# Patient Record
Sex: Male | Born: 1945
Health system: Southern US, Community
[De-identification: ages and names within clinical notes are randomized; demographics above are authoritative.]

## PROBLEM LIST (undated history)

## (undated) DIAGNOSIS — C61 Malignant neoplasm of prostate: Secondary | ICD-10-CM

## (undated) DIAGNOSIS — E119 Type 2 diabetes mellitus without complications: Secondary | ICD-10-CM

## (undated) DIAGNOSIS — R7303 Prediabetes: Secondary | ICD-10-CM

## (undated) DIAGNOSIS — I714 Abdominal aortic aneurysm, without rupture, unspecified: Secondary | ICD-10-CM

## (undated) DIAGNOSIS — I10 Essential (primary) hypertension: Secondary | ICD-10-CM

## (undated) DIAGNOSIS — Z923 Personal history of irradiation: Secondary | ICD-10-CM

## (undated) HISTORY — PX: EYE SURGERY: SHX253

## (undated) HISTORY — PX: HERNIA REPAIR: SHX51

## (undated) HISTORY — DX: Type 2 diabetes mellitus without complications: E11.9

## (undated) HISTORY — PX: CATARACT EXTRACTION, BILATERAL: SHX1313

## (undated) HISTORY — DX: Abdominal aortic aneurysm, without rupture, unspecified: I71.40

## (undated) HISTORY — DX: Personal history of irradiation: Z92.3

---

## 1998-04-25 ENCOUNTER — Emergency Department (HOSPITAL_COMMUNITY): Admission: EM | Admit: 1998-04-25 | Discharge: 1998-04-25 | Payer: Self-pay | Admitting: Emergency Medicine

## 2000-12-31 ENCOUNTER — Encounter: Payer: Self-pay | Admitting: Family Medicine

## 2000-12-31 ENCOUNTER — Encounter: Admission: RE | Admit: 2000-12-31 | Discharge: 2000-12-31 | Payer: Self-pay | Admitting: Family Medicine

## 2001-07-25 ENCOUNTER — Encounter: Admission: RE | Admit: 2001-07-25 | Discharge: 2001-07-25 | Payer: Self-pay | Admitting: Family Medicine

## 2001-07-25 ENCOUNTER — Encounter: Payer: Self-pay | Admitting: Family Medicine

## 2003-09-20 ENCOUNTER — Emergency Department (HOSPITAL_COMMUNITY): Admission: EM | Admit: 2003-09-20 | Discharge: 2003-09-20 | Payer: Self-pay | Admitting: Emergency Medicine

## 2004-03-18 ENCOUNTER — Encounter: Admission: RE | Admit: 2004-03-18 | Discharge: 2004-03-18 | Payer: Self-pay | Admitting: Family Medicine

## 2004-05-07 ENCOUNTER — Encounter: Admission: RE | Admit: 2004-05-07 | Discharge: 2004-05-07 | Payer: Self-pay | Admitting: *Deleted

## 2004-05-19 ENCOUNTER — Ambulatory Visit (HOSPITAL_COMMUNITY): Admission: RE | Admit: 2004-05-19 | Discharge: 2004-05-19 | Payer: Self-pay | Admitting: *Deleted

## 2006-02-16 ENCOUNTER — Ambulatory Visit: Payer: Self-pay

## 2006-02-16 ENCOUNTER — Encounter: Payer: Self-pay | Admitting: Cardiology

## 2008-01-25 ENCOUNTER — Encounter: Admission: RE | Admit: 2008-01-25 | Discharge: 2008-01-25 | Payer: Self-pay | Admitting: Family Medicine

## 2012-08-25 HISTORY — PX: PROSTATE BIOPSY: SHX241

## 2012-08-30 DIAGNOSIS — C61 Malignant neoplasm of prostate: Secondary | ICD-10-CM

## 2012-08-30 HISTORY — DX: Malignant neoplasm of prostate: C61

## 2012-11-02 ENCOUNTER — Encounter: Payer: Self-pay | Admitting: Radiation Oncology

## 2012-11-02 NOTE — Progress Notes (Signed)
GU Location of Tumor / Histology: adenocarcinoma of the prostate  If Prostate Cancer, Gleason Score is (3 + 3=6 and 3+4=7 in 6 cores on the right side) and PSA is (4.82) and prostate volume is 65.71 cc.  Patient presented with an elevated PSA of 4.82.  Biopsies of prostate (if applicable) revealed: adenocarcinoma of the prostate  Past/Anticipated interventions by urology, if any: prostate biopsy  Past/Anticipated interventions by medical oncology, if any: none  Weight changes, if any: none  Bowel/Bladder complaints, if any: Does have occasional problems emptying his bladder completely, urinating less than two hours after urinating, stopped and started urinary stream and weak urinary stream.  He does have occasional constipation.    Nausea/Vomiting, if any: no  Pain issues, if any:  no  SAFETY ISSUES:  Prior radiation? no  Pacemaker/ICD? no  Possible current pregnancy? no  Is the patient on methotrexate? no  Current Complaints / other details:  Nicholas Burke here with his daughter for consult for prostate cancer.  He denies pain, dysuria and blood in his urine.  His wife is sick and has cancer.

## 2012-11-03 ENCOUNTER — Ambulatory Visit
Admission: RE | Admit: 2012-11-03 | Discharge: 2012-11-03 | Disposition: A | Discharge status: Home / Self Care | Payer: Medicare Other | Source: Ambulatory Visit | Attending: Radiation Oncology | Admitting: Radiation Oncology

## 2012-11-03 ENCOUNTER — Encounter: Payer: Self-pay | Admitting: Radiation Oncology

## 2012-11-03 VITALS — BP 136/77 | HR 64 | Temp 98.2°F | Ht 69.0 in | Wt 206.4 lb

## 2012-11-03 DIAGNOSIS — C61 Malignant neoplasm of prostate: Secondary | ICD-10-CM

## 2012-11-03 HISTORY — DX: Essential (primary) hypertension: I10

## 2012-11-03 HISTORY — DX: Malignant neoplasm of prostate: C61

## 2012-11-03 NOTE — Addendum Note (Signed)
Encounter addended by: Maryln Gottron, MD on: 11/03/2012  5:09 PM<BR>     Documentation filed: Normajean Glasgow VN

## 2012-11-03 NOTE — Progress Notes (Signed)
[facility] Radiation Oncology NEW PATIENT EVALUATION  Name: Nicholas Burke MRN: [id number]  Date:   11/03/2012           DOB: 08-05-1945  Status: outpatient   CC: Dr. Jeanmarie Hubert,  Lindaann Slough, MD    REFERRING PHYSICIAN: Lindaann Slough, MD   DIAGNOSIS: Stage TI C. intermediate risk adenocarcinoma prostate   HISTORY OF PRESENT ILLNESS:  Yohannes Waibel is a 67 y.o. male who is seen today for the courtesy of Dr. Brunilda Payor for discussion of possible radiation therapy options in the management of his stage TI C. intermediate risk adenocarcinoma prostate. He was noted to have a rise in his PSA from 3.57 in February 2013 to 4.82 in February of 2014. He was sent over to Dr. Brunilda Payor by Dr. Manus Gunning for further evaluation. He underwent ultrasound-guided biopsies on 08/30/2012 and was found to have Gleason 6 (3+3) involving 50% of one core from the right lateral base, 40% of one core from the right base and 10% of one core from the right mid gland. He Gleason 7 (3+4) in 40% of one core from the right lateral mid gland, 40% of one core from right lateral apex and 40% of one core from the right apex. All biopsies on the left were benign. His gland volume was 65.7 cc. He is doing well from a GU and GI standpoint. His I PSS score is 5. He claims that he is potent. He is interested in seed implantation. He has a strong family history of prostate cancer in his father and brother.  PREVIOUS RADIATION THERAPY: No   PAST MEDICAL HISTORY:  has a past medical history of Hypertension and Prostate cancer.     PAST SURGICAL HISTORY:  Past Surgical History  Procedure Laterality Date  . Eye surgery Bilateral   . Prostate biopsy  08/2012     FAMILY HISTORY: family history includes Prostate cancer in his brother and father. His father was diagnosed with prostate cancer in his late 68s. He is currently alive and living independently at 50. His brother was diagnosed with prostate cancer 46 and was treated  surgically.   SOCIAL HISTORY:  reports that he has never smoked. He does not have any smokeless tobacco history on file. He reports that he does not drink alcohol or use illicit drugs. Married, 4 children. Retired Chartered certified accountant.   ALLERGIES: Review of patient's allergies indicates no known allergies.   MEDICATIONS:  Current Outpatient Prescriptions  Medication Sig Dispense Refill  . diltiazem (DILACOR XR) 240 MG 24 hr capsule Take 240 mg by mouth daily.      . potassium chloride (KLOR-CON) 20 MEQ packet Take 20 mEq by mouth daily.       . quinapril-hydrochlorothiazide (ACCURETIC) 20-25 MG per tablet Take 1 tablet by mouth daily.       No current facility-administered medications for this encounter.     REVIEW OF SYSTEMS:  Pertinent items are noted in HPI.    PHYSICAL EXAM:  height is 5\' 9"  (1.753 m) and weight is 206 lb 6.4 oz (93.622 kg). His temperature is 98.2 F (36.8 C). His blood pressure is 136/77 and his pulse is 64.   Alert and oriented. Head and neck examination: Grossly unremarkable. Nodes: Without palpable cervical or supraclavicular lymphadenopathy. Chest: Lungs clear. Back: No spinal or CVA tenderness. Heart: Regular rate and rhythm. Abdomen: Without masses organomegaly. Genitalia: Unremarkable to inspection. Rectal: The prostate gland is moderately enlarged and is without focal induration or nodularity. Extremities:  Without edema.    LABORATORY DATA:  No results found for this basename: WBC, HGB, HCT, MCV, PLT   No results found for this basename: NA, K, CL, CO2   No results found for this basename: ALT, AST, GGT, ALKPHOS, BILITOT   PSA 4.82 from 06/16/2012   IMPRESSION: Stage TI C. intermediate risk adenocarcinoma prostate. I explained to the patient that his management options include surgery versus radiation therapy. Radiation therapy options include 5 weeks of external beam followed by seed implantation or 8 weeks of external beam/IMRT. He's not a candidate for  seed implantation alone in view of his moderate volume Gleason 7 disease. He may require downsizing for seed implantation based on his volume of 65.7 cc. We discussed the potential acute and late toxicities of radiation therapy. He is not interested in considering robotic surgery. After a lengthy discussion he is most interested in external beam/IMRT without downsizing. I believe that this would be well tolerated. Consent is signed today. I will ask Dr. Brunilda Payor to placed 3 gold seed markers for image guidance during his IMRT. He can then return here for simulation/treatment planning. His prognosis appears to be favorable.   PLAN: As discussed above. After placement of 3 gold seed markers he will be scheduled to return here for simulation/treatment planning.  I spent 60 minutes minutes face to face with the patient and more than 50% of that time was spent in counseling and/or coordination of care.

## 2012-11-03 NOTE — Progress Notes (Signed)
Please see the Nurse Progress Note in the MD Initial Consult Encounter for this patient. 

## 2012-11-04 ENCOUNTER — Telehealth: Payer: Self-pay | Admitting: *Deleted

## 2012-11-04 NOTE — Telephone Encounter (Signed)
CALLED  PATIENT TO INFORM OF GOLD SEED PLACEMENT ON 12-15-12 -ARRIVAL TIME - 2PM AT DR. Brunilda Payor' OFFICE AND HIS SIM ON 12-19-12 AT 8 AM AT DR. MURRAY'S OFFICE, SPOKE WITH PATIENT AND HE IS AWARE OF THESE APPTS.

## 2012-11-18 ENCOUNTER — Telehealth: Payer: Self-pay | Admitting: *Deleted

## 2012-11-18 NOTE — Telephone Encounter (Signed)
Received FMLA papers for [employer] and [employer].  Put on Angela's desk.

## 2012-11-21 ENCOUNTER — Encounter: Payer: Self-pay | Admitting: Radiation Oncology

## 2012-11-21 NOTE — Progress Notes (Signed)
FMLA paperwork rec'd for patient's daughter - given to Dr. Rennie Plowman nurse.

## 2012-11-23 ENCOUNTER — Telehealth: Payer: Self-pay | Admitting: Radiation Oncology

## 2012-11-23 NOTE — Telephone Encounter (Signed)
Placed purple folder with two sets of FMLA paperwork on Dr. Dayton Scrape. Paperwork needs to be signed by Dr. Dayton Scrape to be complete.

## 2012-11-24 ENCOUNTER — Encounter: Payer: Self-pay | Admitting: Radiation Oncology

## 2012-11-24 NOTE — Progress Notes (Signed)
Rec'd 2 sets of FMLA paperwork back from physician.  Called [place] and lm for her regarding pick up of paperwork.  She left me a message and said that her sister, Hearl Heikes, will stop by to pick up.

## 2012-12-15 ENCOUNTER — Telehealth: Payer: Self-pay | Admitting: *Deleted

## 2012-12-15 NOTE — Telephone Encounter (Signed)
CALLED PATIENT TO REMIND OF APPT. FOR 12-19-12, LVM FOR A RETURN CALL

## 2012-12-19 ENCOUNTER — Ambulatory Visit
Admission: RE | Admit: 2012-12-19 | Discharge: 2012-12-19 | Disposition: A | Discharge status: Home / Self Care | Payer: Medicare Other | Source: Ambulatory Visit | Attending: Radiation Oncology | Admitting: Radiation Oncology

## 2012-12-19 DIAGNOSIS — Z510 Encounter for antineoplastic radiation therapy: Secondary | ICD-10-CM | POA: Insufficient documentation

## 2012-12-19 DIAGNOSIS — K5900 Constipation, unspecified: Secondary | ICD-10-CM | POA: Insufficient documentation

## 2012-12-19 DIAGNOSIS — R3915 Urgency of urination: Secondary | ICD-10-CM | POA: Insufficient documentation

## 2012-12-19 DIAGNOSIS — C61 Malignant neoplasm of prostate: Secondary | ICD-10-CM

## 2012-12-19 DIAGNOSIS — R300 Dysuria: Secondary | ICD-10-CM | POA: Insufficient documentation

## 2012-12-19 NOTE — Progress Notes (Signed)
Complex simulation/treatment planning note: The patient was taken to the CT simulator. He was placed supine. A VAC LOC immobilization device was constructed for immobilization. A red rubber tube was placed within the rectal vault. He then underwent a urethrogram. His pelvis was scanned. I contoured the prostate, seminal vesicles, bladder, rectum, and the rectosigmoid colon. I prescribing 7800 cGy in 40 sessions utilizing 6 MV photons, IMRT/VMAT. The seminal vesicles will receive 5600 cGy 40 sessions. He is be treated with a comfortably full bladder. He'll undergo daily image guidance with cone beam CT setting up to his gold seeds.

## 2012-12-20 ENCOUNTER — Encounter: Payer: Self-pay | Admitting: Radiation Oncology

## 2012-12-20 NOTE — Progress Notes (Signed)
IMRT simulation/treatment planning note: Nicholas Burke completed his IMRT simulation/treatment planning today. IMRT was chosen to decrease the risk for both acute and late bladder and rectal toxicity compared to 3-D conformal or conventional radiation therapy. Dose volume histograms were obtained for the target structures including the prostate and seminal vesicles. Dose volume histograms were obtained for the avoidance structures including the bladder, rectum, and femoral heads. We met our departmental guidelines. He is a GU with VMAT IMRT with 2 arcs. I'm prescribing 7800 cGy in 40 sessions to his prostate PTV and 5600 cGy in 40 sessions to his seminal vesicles. He is be treated with a comfortably full bladder and undergo daily cone beam CT setting up to his 3 gold seeds.

## 2013-01-02 ENCOUNTER — Ambulatory Visit
Admission: RE | Admit: 2013-01-02 | Discharge: 2013-01-02 | Disposition: A | Discharge status: Home / Self Care | Payer: Medicare Other | Source: Ambulatory Visit | Attending: Radiation Oncology | Admitting: Radiation Oncology

## 2013-01-02 VITALS — BP 151/104 | HR 62 | Temp 98.1°F | Ht 69.0 in | Wt 210.1 lb

## 2013-01-02 DIAGNOSIS — C61 Malignant neoplasm of prostate: Secondary | ICD-10-CM

## 2013-01-02 NOTE — Progress Notes (Signed)
Wendelyn Breslow here for weekly under treat.  He has had 1 fraction to his prostate.  He reports not being able to fully empty his bladder in the mornings.  He denies nocturia and iarrhea.  He does see occasional blood in his urine since his prostate biopsy three months ago.

## 2013-01-02 NOTE — Progress Notes (Signed)
   Weekly Management Note:  outpatient Current Dose:  1.95 Gy  Projected Dose: 78 Gy   Narrative:  The patient presents for routine under treatment assessment.  CBCT/MVCT images/Port film x-rays were reviewed.  The chart was checked. No new complaints  Physical Findings:  height is 5\' 9"  (1.753 m) and weight is 210 lb 1.6 oz (95.301 kg). His temperature is 98.1 F (36.7 C). His blood pressure is 151/104 and his pulse is 62.  NAD  Impression:  The patient is tolerating radiotherapy.  Plan:  Continue radiotherapy as planned.   ________________________________   Lonie Peak, M.D.

## 2013-01-03 ENCOUNTER — Ambulatory Visit
Admission: RE | Admit: 2013-01-03 | Discharge: 2013-01-03 | Disposition: A | Discharge status: Home / Self Care | Payer: Medicare Other | Source: Ambulatory Visit | Attending: Radiation Oncology | Admitting: Radiation Oncology

## 2013-01-03 DIAGNOSIS — C61 Malignant neoplasm of prostate: Secondary | ICD-10-CM

## 2013-01-03 NOTE — Progress Notes (Signed)
Post sim ed completed w/pt. Gave pt "Radiation and You" booklet w/all pertinent information marked and discussed, re; fatigue, rectal pain/care, bladder irritation/care, nutrition, pain. All questions answered. Pt verbalized understanding.

## 2013-01-04 ENCOUNTER — Ambulatory Visit
Admission: RE | Admit: 2013-01-04 | Discharge: 2013-01-04 | Disposition: A | Discharge status: Home / Self Care | Payer: Medicare Other | Source: Ambulatory Visit | Attending: Radiation Oncology | Admitting: Radiation Oncology

## 2013-01-05 ENCOUNTER — Ambulatory Visit
Admission: RE | Admit: 2013-01-05 | Discharge: 2013-01-05 | Disposition: A | Discharge status: Home / Self Care | Payer: Medicare Other | Source: Ambulatory Visit | Attending: Radiation Oncology | Admitting: Radiation Oncology

## 2013-01-06 ENCOUNTER — Ambulatory Visit
Admission: RE | Admit: 2013-01-06 | Discharge: 2013-01-06 | Disposition: A | Discharge status: Home / Self Care | Payer: Medicare Other | Source: Ambulatory Visit | Attending: Radiation Oncology | Admitting: Radiation Oncology

## 2013-01-09 ENCOUNTER — Encounter: Payer: Self-pay | Admitting: Radiation Oncology

## 2013-01-09 ENCOUNTER — Ambulatory Visit
Admission: RE | Admit: 2013-01-09 | Discharge: 2013-01-09 | Disposition: A | Discharge status: Home / Self Care | Payer: Medicare Other | Source: Ambulatory Visit | Attending: Radiation Oncology | Admitting: Radiation Oncology

## 2013-01-09 VITALS — BP 147/91 | HR 74 | Temp 98.2°F | Resp 20 | Wt 208.6 lb

## 2013-01-09 DIAGNOSIS — C61 Malignant neoplasm of prostate: Secondary | ICD-10-CM

## 2013-01-09 NOTE — Progress Notes (Signed)
Weekly Management Note:  Site: Prostate Current Dose:  1170  cGy Projected Dose: 7800  cGy  Narrative: The patient is seen today for routine under treatment assessment. CBCT/MVCT images/port films were reviewed. The chart was reviewed.   Bladder filling is satisfactory. No new GU or GI difficulties. He does have some urinary urgency.  Physical Examination:  Filed Vitals:   01/09/13 0922  BP: 147/91  Pulse: 74  Temp: 98.2 F (36.8 C)  Resp: 20  .  Weight: 208 lb 9.6 oz (94.62 kg). No change.  Impression: Tolerating radiation therapy well.  Plan: Continue radiation therapy as planned.

## 2013-01-09 NOTE — Progress Notes (Signed)
Pt denies pain, fatigue, loss of appetite. He states he is beginning to have some urinary urgency. He denies other bladder or bowel issues.

## 2013-01-10 ENCOUNTER — Ambulatory Visit
Admission: RE | Admit: 2013-01-10 | Discharge: 2013-01-10 | Disposition: A | Discharge status: Home / Self Care | Payer: Medicare Other | Source: Ambulatory Visit | Attending: Radiation Oncology | Admitting: Radiation Oncology

## 2013-01-11 ENCOUNTER — Ambulatory Visit
Admission: RE | Admit: 2013-01-11 | Discharge: 2013-01-11 | Disposition: A | Discharge status: Home / Self Care | Payer: Medicare Other | Source: Ambulatory Visit | Attending: Radiation Oncology | Admitting: Radiation Oncology

## 2013-01-12 ENCOUNTER — Ambulatory Visit
Admission: RE | Admit: 2013-01-12 | Discharge: 2013-01-12 | Disposition: A | Discharge status: Home / Self Care | Payer: Medicare Other | Source: Ambulatory Visit | Attending: Radiation Oncology | Admitting: Radiation Oncology

## 2013-01-13 ENCOUNTER — Ambulatory Visit
Admission: RE | Admit: 2013-01-13 | Discharge: 2013-01-13 | Disposition: A | Discharge status: Home / Self Care | Payer: Medicare Other | Source: Ambulatory Visit | Attending: Radiation Oncology | Admitting: Radiation Oncology

## 2013-01-16 ENCOUNTER — Ambulatory Visit
Admission: RE | Admit: 2013-01-16 | Discharge: 2013-01-16 | Disposition: A | Discharge status: Home / Self Care | Payer: Medicare Other | Source: Ambulatory Visit | Attending: Radiation Oncology | Admitting: Radiation Oncology

## 2013-01-16 ENCOUNTER — Encounter: Payer: Self-pay | Admitting: Radiation Oncology

## 2013-01-16 ENCOUNTER — Other Ambulatory Visit: Payer: Self-pay | Admitting: Radiation Oncology

## 2013-01-16 ENCOUNTER — Telehealth: Payer: Self-pay | Admitting: *Deleted

## 2013-01-16 VITALS — BP 158/87 | HR 60 | Temp 97.9°F | Resp 20

## 2013-01-16 DIAGNOSIS — C61 Malignant neoplasm of prostate: Secondary | ICD-10-CM

## 2013-01-16 DIAGNOSIS — R300 Dysuria: Secondary | ICD-10-CM

## 2013-01-16 LAB — URINALYSIS, MICROSCOPIC - CHCC
Bilirubin (Urine): NEGATIVE
Ketones: NEGATIVE mg/dL
Nitrite: NEGATIVE
pH: 7.5 (ref 4.6–8.0)

## 2013-01-16 NOTE — Progress Notes (Signed)
Weekly Management Note:  Site: Prostate Current Dose:  2145  cGy Projected Dose: 7800  cGy  Narrative: The patient is seen today for routine under treatment assessment. CBCT/MVCT images/port films were reviewed. The chart was reviewed.   Bladder filling is less than ideal. He does report foul-smelling urine for the past week with change of his urine color. He is having some urinary frequency, urgency, and mild dysuria. He is also constipated.  Physical Examination:  Filed Vitals:   01/16/13 0910  BP: 158/87  Pulse: 60  Temp: 97.9 F (36.6 C)  Resp: 20  .  Weight:  . No change  Impression: Tolerating radiation therapy well. Will go ahead and check his urine for UA/C&S today to make sure he does not have an infection. I will like for him to start a stool softener. I also encouraged him to have a comfortably full bladder each day for his treatment.  Plan: Continue radiation therapy as planned.

## 2013-01-16 NOTE — Progress Notes (Signed)
Pt reports increased urinary frequency and nocturia 3 x nightly, urgency, dysuria, foul odor to his urine. He denies darker or cloudy urine. Pt states his bm's are small, hard balls. He states he has taken stool softeners in past, has not taken any at this time. Pt denies fatigue, loss of appetite.

## 2013-01-16 NOTE — Telephone Encounter (Addendum)
Spoke w/pt and informed him UA today was normal. Advised he increase fluid intake, avoid caffeine and alcohol as these are bladder irritants. Pt verbalized understanding.

## 2013-01-17 ENCOUNTER — Ambulatory Visit
Admission: RE | Admit: 2013-01-17 | Discharge: 2013-01-17 | Disposition: A | Discharge status: Home / Self Care | Payer: Medicare Other | Source: Ambulatory Visit | Attending: Radiation Oncology | Admitting: Radiation Oncology

## 2013-01-18 ENCOUNTER — Ambulatory Visit
Admission: RE | Admit: 2013-01-18 | Discharge: 2013-01-18 | Disposition: A | Discharge status: Home / Self Care | Payer: Medicare Other | Source: Ambulatory Visit | Attending: Radiation Oncology | Admitting: Radiation Oncology

## 2013-01-19 ENCOUNTER — Ambulatory Visit
Admission: RE | Admit: 2013-01-19 | Discharge: 2013-01-19 | Disposition: A | Discharge status: Home / Self Care | Payer: Medicare Other | Source: Ambulatory Visit | Attending: Radiation Oncology | Admitting: Radiation Oncology

## 2013-01-20 ENCOUNTER — Ambulatory Visit
Admission: RE | Admit: 2013-01-20 | Discharge: 2013-01-20 | Disposition: A | Discharge status: Home / Self Care | Payer: Medicare Other | Source: Ambulatory Visit | Attending: Radiation Oncology | Admitting: Radiation Oncology

## 2013-01-20 NOTE — Progress Notes (Signed)
Pt came to nursing after radiation treatment today stating he is still having burning w/urination, "the same as on Monday" when he saw Dr Dayton Scrape. Pt had UA at that time, results WNL; C&S showed 'no growth'. Pt states he began drinking cranberry juice yesterday. Instructed pt to buy OTC Azo or Cystex at drug store to take this weekend. Pt will see Dr Dayton Scrape 01/23/13 and can discuss further if he does not receive good results over the weekend. Pt also asking how to "keep his bladder full". He states he drinks water before treatment, "but it goes right through me". Advised pt to drink water as soon as he arrives for treatment on Monday. If that is not successful he can discuss w/Dr Dayton Scrape. Pt verbalized understanding, agreement.  Message routed to Dr Dayton Scrape who is out of office today.

## 2013-01-23 ENCOUNTER — Ambulatory Visit
Admission: RE | Admit: 2013-01-23 | Discharge: 2013-01-23 | Disposition: A | Discharge status: Home / Self Care | Payer: Medicare Other | Source: Ambulatory Visit | Attending: Radiation Oncology | Admitting: Radiation Oncology

## 2013-01-23 ENCOUNTER — Telehealth: Payer: Self-pay | Admitting: *Deleted

## 2013-01-23 ENCOUNTER — Encounter: Payer: Self-pay | Admitting: Radiation Oncology

## 2013-01-23 ENCOUNTER — Other Ambulatory Visit: Payer: Self-pay | Admitting: Radiation Oncology

## 2013-01-23 VITALS — BP 157/96 | Temp 98.2°F | Resp 20 | Wt 208.6 lb

## 2013-01-23 DIAGNOSIS — R300 Dysuria: Secondary | ICD-10-CM

## 2013-01-23 DIAGNOSIS — C61 Malignant neoplasm of prostate: Secondary | ICD-10-CM

## 2013-01-23 MED ORDER — PHENAZOPYRIDINE HCL 95 MG PO TABS: 95.0000 mg | ORAL_TABLET | Freq: Three times a day (TID) | ORAL | Status: DC | PRN

## 2013-01-23 NOTE — Progress Notes (Signed)
Weekly Management Note:  Site: Prostate Current Dose:  3120  cGy Projected Dose: 7800  cGy  Narrative: The patient is seen today for routine under treatment assessment. CBCT/MVCT images/port films were reviewed. The chart was reviewed.   Bladder filling satisfactory but less than ideal. He continues to have dysuria and urinary frequency with nocturia x3. His force of stream varies from weak to strong. No significant GI difficulties.  Physical Examination:  Filed Vitals:   01/23/13 0920  BP: 157/96  Temp: 98.2 F (36.8 C)  Resp: 20  .  Weight: 208 lb 9.6 oz (94.62 kg). No change.  Impression: Tolerating radiation therapy well. I'll start Pyridium for his dysuria. I suspect that he is having some degree of bladder spasms which may benefit from an anti-spasmodic medication. I am concerned that his stream is weak at times, and I would like to avoid having him going into urinary retention. If he still symptomatic but later this week then we will consider an anti-spasmodic.  Plan: Continue radiation therapy as planned.

## 2013-01-23 NOTE — Progress Notes (Addendum)
Pt states he took AZO over the weekend and had fair but short lived relief of dysuria. He states he has continued to drink cranberry juice. He states dysuria is not worse, but not improved either. Suggested he discuss dysuria w/Dr Dayton Scrape today for possible prescription medication. He also reports urgency. He states he took Miralax last week for constipation but then his bms were loose. He states they are back to normal now.

## 2013-01-23 NOTE — Telephone Encounter (Signed)
Received call from pt stating the pharmacist told him the prescription Dr Dayton Scrape sent today is the same as the AZO which the pt was already taking. Discussed w/Dr Dayton Scrape who states" Tell pt not to fill the prescription and to take Azo 2 tabs every 8 hours." Informed pt of Dr Rennie Plowman instructions; pt repeated instructions back correctly and verbalized understanding.

## 2013-01-24 ENCOUNTER — Ambulatory Visit
Admission: RE | Admit: 2013-01-24 | Discharge: 2013-01-24 | Disposition: A | Discharge status: Home / Self Care | Payer: Medicare Other | Source: Ambulatory Visit | Attending: Radiation Oncology | Admitting: Radiation Oncology

## 2013-01-25 ENCOUNTER — Ambulatory Visit
Admission: RE | Admit: 2013-01-25 | Discharge: 2013-01-25 | Disposition: A | Discharge status: Home / Self Care | Payer: Medicare Other | Source: Ambulatory Visit | Attending: Radiation Oncology | Admitting: Radiation Oncology

## 2013-01-26 ENCOUNTER — Telehealth: Payer: Self-pay | Admitting: *Deleted

## 2013-01-26 ENCOUNTER — Other Ambulatory Visit: Payer: Self-pay | Admitting: Radiation Oncology

## 2013-01-26 ENCOUNTER — Ambulatory Visit
Admission: RE | Admit: 2013-01-26 | Discharge: 2013-01-26 | Disposition: A | Discharge status: Home / Self Care | Payer: Medicare Other | Source: Ambulatory Visit | Attending: Radiation Oncology | Admitting: Radiation Oncology

## 2013-01-26 NOTE — Progress Notes (Signed)
patient came to nursing after rad tx  and requesting RX for pyridium to be 200 mg strength,  'Says the 95 mg not helping but the 200mg  strength is, he is taking  2= 95 mg at a time and is running out ,please call to [facility], will e-mail Dr.murray and call patient back with status 9:20 AM

## 2013-01-26 NOTE — Telephone Encounter (Signed)
Returned call after speaking with Dr.murray , informed him to take 2 tabs of pyridium they are OTC, insurance will not reimburse cost, patient thanked MD and this Rn for call back  3:45 PM

## 2013-01-27 ENCOUNTER — Ambulatory Visit
Admission: RE | Admit: 2013-01-27 | Discharge: 2013-01-27 | Disposition: A | Discharge status: Home / Self Care | Payer: Medicare Other | Source: Ambulatory Visit | Attending: Radiation Oncology | Admitting: Radiation Oncology

## 2013-01-30 ENCOUNTER — Ambulatory Visit
Admission: RE | Admit: 2013-01-30 | Discharge: 2013-01-30 | Disposition: A | Discharge status: Home / Self Care | Payer: Medicare Other | Source: Ambulatory Visit | Attending: Radiation Oncology | Admitting: Radiation Oncology

## 2013-01-31 ENCOUNTER — Ambulatory Visit
Admission: RE | Admit: 2013-01-31 | Discharge: 2013-01-31 | Disposition: A | Discharge status: Home / Self Care | Payer: Medicare Other | Source: Ambulatory Visit | Attending: Radiation Oncology | Admitting: Radiation Oncology

## 2013-01-31 ENCOUNTER — Encounter: Payer: Self-pay | Admitting: Radiation Oncology

## 2013-01-31 VITALS — BP 150/90 | HR 67 | Temp 98.1°F | Resp 20 | Wt 209.1 lb

## 2013-01-31 DIAGNOSIS — C61 Malignant neoplasm of prostate: Secondary | ICD-10-CM

## 2013-01-31 NOTE — Progress Notes (Signed)
Weekly rad txs, prostate, 22/40 completed, stopped the pyridium when his dysuria got better, now it's back , has frequency,urgency,dysuria, nocturia every 45 min -1 hour,  Wants rx for 200mg   Insisting, , regular bowel movements, appetite good, no fatigue , drinks plenty water 9:17 AM

## 2013-01-31 NOTE — Progress Notes (Signed)
Weekly Management Note:  Site: Prostate Current Dose:  4290  cGy Projected Dose: 7800  cGy  Narrative: The patient is seen today for routine under treatment assessment. CBCT/MVCT images/port films were reviewed. The chart was reviewed.   Bladder filling is satisfactory but less than ideal. He did take generic Pyridium OTC and this was helpful when he today. However, he wants the prescription 200 mg dose and would like a prescription. I explained that he could take 2 tablets of the generic which should be close to a 200 mg dose. He still wants a prescription. He understands that this will not be covered by his insurance. He continues to have urinary frequency and nocturia every one to 2 hours.  Physical Examination:  Filed Vitals:   01/31/13 0913  BP: 150/90  Pulse: 67  Temp: 98.1 F (36.7 C)  Resp: 20  .  Weight: 209 lb 1.6 oz (94.847 kg). No change.  Impression: Tolerating radiation therapy well. He clearly has bladder irritability, he may benefit from an anti-spasmodic medication. I will see him he does when he goes back on his Pyridium, may prescribed Ditropan XL next week.  Plan: Continue radiation therapy as planned.

## 2013-02-01 ENCOUNTER — Ambulatory Visit
Admission: RE | Admit: 2013-02-01 | Discharge: 2013-02-01 | Disposition: A | Discharge status: Home / Self Care | Payer: Medicare Other | Source: Ambulatory Visit | Attending: Radiation Oncology | Admitting: Radiation Oncology

## 2013-02-02 ENCOUNTER — Ambulatory Visit
Admission: RE | Admit: 2013-02-02 | Discharge: 2013-02-02 | Disposition: A | Discharge status: Home / Self Care | Payer: Medicare Other | Source: Ambulatory Visit | Attending: Radiation Oncology | Admitting: Radiation Oncology

## 2013-02-03 ENCOUNTER — Ambulatory Visit
Admission: RE | Admit: 2013-02-03 | Discharge: 2013-02-03 | Disposition: A | Discharge status: Home / Self Care | Payer: Medicare Other | Source: Ambulatory Visit | Attending: Radiation Oncology | Admitting: Radiation Oncology

## 2013-02-06 ENCOUNTER — Ambulatory Visit
Admission: RE | Admit: 2013-02-06 | Discharge: 2013-02-06 | Disposition: A | Discharge status: Home / Self Care | Payer: Medicare Other | Source: Ambulatory Visit | Attending: Radiation Oncology | Admitting: Radiation Oncology

## 2013-02-06 ENCOUNTER — Encounter: Payer: Self-pay | Admitting: Radiation Oncology

## 2013-02-06 VITALS — BP 143/90 | HR 75 | Temp 97.6°F | Resp 20 | Wt 208.8 lb

## 2013-02-06 DIAGNOSIS — C61 Malignant neoplasm of prostate: Secondary | ICD-10-CM

## 2013-02-06 NOTE — Progress Notes (Signed)
Weekly Management Note:  Site: Prostate Current Dose:  5070  cGy Projected Dose: 7800  cGy  Narrative: The patient is seen today for routine under treatment assessment. CBCT/MVCT images/port films were reviewed. The chart was reviewed.   Bladder filling is excellent today. He stopped Pyridium because it made him "numb". He feels that his bladder filling is improved since he can his sense bladder filling at this time. Since his bladder filling was satisfactory, I doubt that he is having spasms, and we'll hold off on any anti-spasmodic medication. No GI difficulties.  Physical Examination:  Filed Vitals:   02/06/13 0924  BP: 143/90  Pulse: 75  Temp: 97.6 F (36.4 C)  Resp: 20  .  Weight: 208 lb 12.8 oz (94.711 kg). No change.  Impression: Tolerating radiation therapy well.  Plan: Continue radiation therapy as planned.

## 2013-02-06 NOTE — Progress Notes (Signed)
Pt denies pain, fatigue, loss of appetite. He states he stopped taking Pyridium last Fri due to "everything feeling numb", indicating his pelvis area. He states his urinary frequency, nocturia and dysuria have improved since he stopped Pyridium. He states he got up 3 x last night to void vs every 30 minutes.

## 2013-02-07 ENCOUNTER — Ambulatory Visit
Admission: RE | Admit: 2013-02-07 | Discharge: 2013-02-07 | Disposition: A | Discharge status: Home / Self Care | Payer: Medicare Other | Source: Ambulatory Visit | Attending: Radiation Oncology | Admitting: Radiation Oncology

## 2013-02-08 ENCOUNTER — Ambulatory Visit
Admission: RE | Admit: 2013-02-08 | Discharge: 2013-02-08 | Disposition: A | Discharge status: Home / Self Care | Payer: Medicare Other | Source: Ambulatory Visit | Attending: Radiation Oncology | Admitting: Radiation Oncology

## 2013-02-09 ENCOUNTER — Ambulatory Visit
Admission: RE | Admit: 2013-02-09 | Discharge: 2013-02-09 | Disposition: A | Discharge status: Home / Self Care | Payer: Medicare Other | Source: Ambulatory Visit | Attending: Radiation Oncology | Admitting: Radiation Oncology

## 2013-02-10 ENCOUNTER — Ambulatory Visit
Admission: RE | Admit: 2013-02-10 | Discharge: 2013-02-10 | Disposition: A | Discharge status: Home / Self Care | Payer: Medicare Other | Source: Ambulatory Visit | Attending: Radiation Oncology | Admitting: Radiation Oncology

## 2013-02-10 ENCOUNTER — Encounter: Payer: Self-pay | Admitting: Radiation Oncology

## 2013-02-10 VITALS — BP 148/79 | HR 66 | Temp 97.6°F | Resp 20

## 2013-02-10 DIAGNOSIS — C61 Malignant neoplasm of prostate: Secondary | ICD-10-CM

## 2013-02-10 MED ORDER — TAMSULOSIN HCL 0.4 MG PO CAPS: 0.4000 mg | ORAL_CAPSULE | Freq: Every day | ORAL | Status: DC

## 2013-02-10 NOTE — Progress Notes (Signed)
Weekly Management Note:  Site: Prostate Current Dose:  5850  cGy Projected Dose: 7800  cGy  Narrative: The patient is seen today for routine under treatment assessment. CBCT/MVCT images/port films were reviewed. The chart was reviewed.   Bladder filling is satisfactory, but bladder is not terribly distended. He seen today for urinary frequency and sensation of incomplete emptying.  Physical Examination:  Filed Vitals:   02/10/13 0914  BP: 148/79  Pulse: 66  Temp: 97.6 F (36.4 C)  Resp: 20  .  Weight:  . No change.  Impression: Tolerating radiation therapy well. He may be having obstructive symptoms, therefore I'll start off with tamsulosin 0.4 mg daily.  Plan: Continue radiation therapy as planned.

## 2013-02-10 NOTE — Progress Notes (Signed)
Pt in nursing after radiation treatment today c/o "feeling like I'm not emptying my bladder". Pt states 2 days ago he noticed that he would urinate then have to go back soon after, and he would void more. He has slight dysuria, slight increase in nocturia, increase in frequency. He states his bm's  get "a little loose then better".

## 2013-02-13 ENCOUNTER — Ambulatory Visit
Admission: RE | Admit: 2013-02-13 | Discharge: 2013-02-13 | Disposition: A | Discharge status: Home / Self Care | Payer: Medicare Other | Source: Ambulatory Visit | Attending: Radiation Oncology | Admitting: Radiation Oncology

## 2013-02-14 ENCOUNTER — Ambulatory Visit
Admission: RE | Admit: 2013-02-14 | Discharge: 2013-02-14 | Disposition: A | Discharge status: Home / Self Care | Payer: Medicare Other | Source: Ambulatory Visit | Attending: Radiation Oncology | Admitting: Radiation Oncology

## 2013-02-14 ENCOUNTER — Encounter: Payer: Self-pay | Admitting: Radiation Oncology

## 2013-02-14 VITALS — BP 157/94 | HR 76 | Temp 97.7°F | Resp 20 | Wt 211.4 lb

## 2013-02-14 DIAGNOSIS — C61 Malignant neoplasm of prostate: Secondary | ICD-10-CM

## 2013-02-14 NOTE — Progress Notes (Signed)
Pt denies pain, fatigue, loss of appetite, urinary or bowel issues. He is no longer taking Pyridium. He has urinary frequency but not increased over his baseline.

## 2013-02-14 NOTE — Progress Notes (Signed)
Weekly Management Note:  Site: Prostate Current Dose:  6240  cGy Projected Dose: 7800  cGy  Narrative: The patient is seen today for routine under treatment assessment. CBCT/MVCT images/port films were reviewed. The chart was reviewed.   Bladder filling is less than ideal today. No new GU or GI difficulty. He is on Flomax since late last week and he believes that this is helpful. He no longer takes Pyridium.  Physical Examination:  Filed Vitals:   02/14/13 0903  BP: 157/94  Pulse: 76  Temp: 97.7 F (36.5 C)  Resp: 20  .  Weight: 211 lb 6.4 oz (95.89 kg). No change.  Impression: Tolerating radiation therapy well. I encouraged him to improve his bladder filling.  Plan: Continue radiation therapy as planned.

## 2013-02-15 ENCOUNTER — Ambulatory Visit
Admission: RE | Admit: 2013-02-15 | Discharge: 2013-02-15 | Disposition: A | Discharge status: Home / Self Care | Payer: Medicare Other | Source: Ambulatory Visit | Attending: Radiation Oncology | Admitting: Radiation Oncology

## 2013-02-16 ENCOUNTER — Ambulatory Visit
Admission: RE | Admit: 2013-02-16 | Discharge: 2013-02-16 | Disposition: A | Discharge status: Home / Self Care | Payer: Medicare Other | Source: Ambulatory Visit | Attending: Radiation Oncology | Admitting: Radiation Oncology

## 2013-02-17 ENCOUNTER — Ambulatory Visit
Admission: RE | Admit: 2013-02-17 | Discharge: 2013-02-17 | Disposition: A | Discharge status: Home / Self Care | Payer: Medicare Other | Source: Ambulatory Visit | Attending: Radiation Oncology | Admitting: Radiation Oncology

## 2013-02-20 ENCOUNTER — Encounter: Payer: Self-pay | Admitting: Radiation Oncology

## 2013-02-20 ENCOUNTER — Ambulatory Visit
Admission: RE | Admit: 2013-02-20 | Discharge: 2013-02-20 | Disposition: A | Discharge status: Home / Self Care | Payer: Medicare Other | Source: Ambulatory Visit | Attending: Radiation Oncology | Admitting: Radiation Oncology

## 2013-02-20 VITALS — BP 168/91 | HR 62 | Temp 97.5°F | Resp 20 | Wt 209.2 lb

## 2013-02-20 DIAGNOSIS — C61 Malignant neoplasm of prostate: Secondary | ICD-10-CM

## 2013-02-20 NOTE — Progress Notes (Signed)
Pt denies pain, loss of appetite, fatigue. He states he continues to have urinary urgency, nocturia x 3. He denies dysuria, bowel issues. He states his BMs are soft occassionally.  Pt states he stopped taking Flomax because he did not get any relief. Pt will complete treatment on Friday, gave him 1 month FU card.

## 2013-02-20 NOTE — Progress Notes (Signed)
Weekly Management Note:  Site: Prostate Current Dose:  7020  cGy Projected Dose: 7800  cGy  Narrative: The patient is seen today for routine under treatment assessment. CBCT/MVCT images/port films were reviewed. The chart was reviewed.   Bladder filling is satisfactory. No new GU or GI difficulty. He stopped his Flomax because he was having difficulty maintaining a comfortably full bladder.  Physical Examination:  Filed Vitals:   02/20/13 0918  BP: 168/91  Pulse: 62  Temp: 97.5 F (36.4 C)  Resp: 20  .  Weight: 209 lb 3.2 oz (94.892 kg). No change.  Impression: Tolerating radiation therapy well. He'll finish his treatment this Friday and he'll be scheduled for a one-month followup appointment.  Plan: Continue radiation therapy as planned.

## 2013-02-21 ENCOUNTER — Ambulatory Visit
Admission: RE | Admit: 2013-02-21 | Discharge: 2013-02-21 | Disposition: A | Discharge status: Home / Self Care | Payer: Medicare Other | Source: Ambulatory Visit | Attending: Radiation Oncology | Admitting: Radiation Oncology

## 2013-02-22 ENCOUNTER — Ambulatory Visit
Admission: RE | Admit: 2013-02-22 | Discharge: 2013-02-22 | Disposition: A | Discharge status: Home / Self Care | Payer: Medicare Other | Source: Ambulatory Visit | Attending: Radiation Oncology | Admitting: Radiation Oncology

## 2013-02-23 ENCOUNTER — Ambulatory Visit
Admission: RE | Admit: 2013-02-23 | Discharge: 2013-02-23 | Disposition: A | Discharge status: Home / Self Care | Payer: Medicare Other | Source: Ambulatory Visit | Attending: Radiation Oncology | Admitting: Radiation Oncology

## 2013-02-24 ENCOUNTER — Encounter: Payer: Self-pay | Admitting: Radiation Oncology

## 2013-02-24 ENCOUNTER — Ambulatory Visit
Admission: RE | Admit: 2013-02-24 | Discharge: 2013-02-24 | Disposition: A | Discharge status: Home / Self Care | Payer: Medicare Other | Source: Ambulatory Visit | Attending: Radiation Oncology | Admitting: Radiation Oncology

## 2013-02-24 NOTE — Progress Notes (Signed)
[facility] Radiation Oncology End of Treatment Note  Name:Nicholas Burke  Date: 02/24/2013 ZOX:[id number] DOB:09/20/1945   Status:outpatient    CC: Thora Lance, MD  Dr. Su Grand  REFERRING PHYSICIAN:  Dr. Su Grand   DIAGNOSIS: Stage TI C. intermediate risk adenocarcinoma prostate    INDICATION FOR TREATMENT: Curative   TREATMENT DATES: 01/02/2013 through 02/24/2013                          SITE/DOSE: Prostate 7800 cGy in 40 sessions, seminal vesicles 5600 cGy in 40 sessions                           BEAMS/ENERGY:  6 MV photons, dual ARC VMAT IMRT                 NARRATIVE:   Mr. Siems tolerated his treatment reasonably well although he did use Pyridium when necessary for dysuria, and also Flomax intermittently for slowing of his urinary stream and urinary frequency.                         PLAN: Routine followup in one month. Patient instructed to call if questions or worsening complaints in interim.

## 2013-02-24 NOTE — Progress Notes (Signed)
Chart note: The patient began his IMRT on 01/02/2013. He was treated with 2 sets of dynamic MLCs corresponding to one set of IMRT treatment devices [phone]).

## 2013-03-22 ENCOUNTER — Encounter: Payer: Self-pay | Admitting: *Deleted

## 2013-03-22 DIAGNOSIS — Z923 Personal history of irradiation: Secondary | ICD-10-CM | POA: Insufficient documentation

## 2013-03-28 ENCOUNTER — Encounter: Payer: Self-pay | Admitting: Radiation Oncology

## 2013-03-28 ENCOUNTER — Ambulatory Visit
Admission: RE | Admit: 2013-03-28 | Discharge: 2013-03-28 | Disposition: A | Discharge status: Home / Self Care | Payer: Medicare Other | Source: Ambulatory Visit | Attending: Radiation Oncology | Admitting: Radiation Oncology

## 2013-03-28 VITALS — BP 154/89 | HR 69 | Temp 97.7°F | Resp 20 | Wt 207.0 lb

## 2013-03-28 DIAGNOSIS — C61 Malignant neoplasm of prostate: Secondary | ICD-10-CM

## 2013-03-28 NOTE — Progress Notes (Signed)
CC: Dr. Su Grand  Followup note:  Nicholas Burke returns today approximately 1 month following completion of external beam/IMRT in the management of his stage TI C. intermediate risk adenocarcinoma prostate. He is doing well from a GU and GI standpoint and he is  back to his baseline status except for minimal urinary urgency. He is pleased with his progress. He has not yet have an appointment to see Nicholas Burke.  Physical examination: Alert and oriented. Filed Vitals:   03/28/13 1503  BP: 154/89  Pulse: 69  Temp: 97.7 F (36.5 C)  Resp: 20   Rectal examination not performed today.  Impression: Satisfactory progress.  Plan: My understanding is that Nicholas Burke will see him for a followup visit in 1-2 months. I've not scheduled the patient for a formal followup visit, and I ask that Nicholas Burke keep me posted on his progress.

## 2013-03-28 NOTE — Progress Notes (Signed)
Pt denies pain, urinary, bowel issues, fatigue, loss of appetite. He is no longer taking Flomax or Pyridium. He has no FU w/Dr Nesi at this time.

## 2014-10-09 ENCOUNTER — Encounter (HOSPITAL_COMMUNITY): Payer: Self-pay | Admitting: *Deleted

## 2014-10-15 ENCOUNTER — Other Ambulatory Visit: Payer: Self-pay | Admitting: Gastroenterology

## 2014-10-15 NOTE — Addendum Note (Signed)
Addended by: Wilford Corner on: 10/15/2014 11:52 AM   Modules accepted: Orders

## 2014-10-18 ENCOUNTER — Ambulatory Visit (HOSPITAL_COMMUNITY)
Admission: RE | Admit: 2014-10-18 | Discharge: 2014-10-18 | Disposition: A | Discharge status: Home / Self Care | Payer: Medicare Other | Source: Ambulatory Visit | Attending: Gastroenterology | Admitting: Gastroenterology

## 2014-10-18 ENCOUNTER — Ambulatory Visit (HOSPITAL_COMMUNITY): Payer: Medicare Other | Admitting: Anesthesiology

## 2014-10-18 ENCOUNTER — Encounter (HOSPITAL_COMMUNITY)
Admission: RE | Disposition: A | Discharge status: Home / Self Care | Payer: Self-pay | Source: Ambulatory Visit | Attending: Gastroenterology

## 2014-10-18 ENCOUNTER — Encounter (HOSPITAL_COMMUNITY): Payer: Self-pay | Admitting: Anesthesiology

## 2014-10-18 DIAGNOSIS — K648 Other hemorrhoids: Secondary | ICD-10-CM | POA: Insufficient documentation

## 2014-10-18 DIAGNOSIS — Z1211 Encounter for screening for malignant neoplasm of colon: Secondary | ICD-10-CM | POA: Diagnosis present

## 2014-10-18 DIAGNOSIS — D122 Benign neoplasm of ascending colon: Secondary | ICD-10-CM | POA: Diagnosis not present

## 2014-10-18 DIAGNOSIS — Z79899 Other long term (current) drug therapy: Secondary | ICD-10-CM | POA: Insufficient documentation

## 2014-10-18 DIAGNOSIS — K627 Radiation proctitis: Secondary | ICD-10-CM | POA: Diagnosis not present

## 2014-10-18 DIAGNOSIS — Z8546 Personal history of malignant neoplasm of prostate: Secondary | ICD-10-CM | POA: Diagnosis not present

## 2014-10-18 DIAGNOSIS — I10 Essential (primary) hypertension: Secondary | ICD-10-CM | POA: Insufficient documentation

## 2014-10-18 HISTORY — PX: COLONOSCOPY WITH PROPOFOL: SHX5780

## 2014-10-18 HISTORY — PX: HOT HEMOSTASIS: SHX5433

## 2014-10-18 SURGERY — COLONOSCOPY WITH PROPOFOL
Anesthesia: Monitor Anesthesia Care

## 2014-10-18 MED ORDER — SODIUM CHLORIDE 0.9 % IV SOLN: INTRAVENOUS | Status: DC

## 2014-10-18 MED ORDER — ONDANSETRON HCL 4 MG/2ML IJ SOLN
INTRAMUSCULAR | Status: DC | PRN
  Administered 2014-10-18: 4 mg via INTRAVENOUS

## 2014-10-18 MED ORDER — ONDANSETRON HCL 4 MG/2ML IJ SOLN
INTRAMUSCULAR | Status: AC
  Filled 2014-10-18: qty 2

## 2014-10-18 MED ORDER — LIDOCAINE HCL (CARDIAC) 20 MG/ML IV SOLN
INTRAVENOUS | Status: DC | PRN
  Administered 2014-10-18: 100 mg via INTRAVENOUS

## 2014-10-18 MED ORDER — LIDOCAINE HCL (CARDIAC) 20 MG/ML IV SOLN
INTRAVENOUS | Status: AC
  Filled 2014-10-18: qty 5

## 2014-10-18 MED ORDER — PROPOFOL INFUSION 10 MG/ML OPTIME
INTRAVENOUS | Status: DC | PRN
  Administered 2014-10-18: 300 ug/kg/min via INTRAVENOUS

## 2014-10-18 MED ORDER — KETAMINE HCL 10 MG/ML IJ SOLN
INTRAMUSCULAR | Status: DC | PRN
Start: 2014-10-18 — End: 2014-10-18
  Administered 2014-10-18: 20 mg via INTRAVENOUS

## 2014-10-18 MED ORDER — PROPOFOL 10 MG/ML IV BOLUS
INTRAVENOUS | Status: AC
  Filled 2014-10-18: qty 20

## 2014-10-18 MED ORDER — LACTATED RINGERS IV SOLN
INTRAVENOUS | Status: DC
  Administered 2014-10-18: 1000 mL via INTRAVENOUS

## 2014-10-18 SURGICAL SUPPLY — 22 items

## 2014-10-18 NOTE — Interval H&P Note (Signed)
History and Physical Interval Note:  10/18/2014 11:03 AM  Nicholas Burke  has presented today for surgery, with the diagnosis of screening  The various methods of treatment have been discussed with the patient and family. After consideration of risks, benefits and other options for treatment, the patient has consented to  Procedure(s): COLONOSCOPY WITH PROPOFOL (N/A) HOT HEMOSTASIS (ARGON PLASMA COAGULATION/BICAP) (N/A) as a surgical intervention .  The patient's history has been reviewed, patient examined, no change in status, stable for surgery.  I have reviewed the patient's chart and labs.  Questions were answered to the patient's satisfaction.     Dickson C.

## 2014-10-18 NOTE — Transfer of Care (Signed)
Immediate Anesthesia Transfer of Care Note  Patient: Nicholas Burke  Procedure(s) Performed: Procedure(s): COLONOSCOPY WITH PROPOFOL (N/A) HOT HEMOSTASIS (ARGON PLASMA COAGULATION/BICAP) (N/A)  Patient Location: PACU  Anesthesia Type:MAC  Level of Consciousness:  sedated, patient cooperative and responds to stimulation  Airway & Oxygen Therapy:Patient Spontanous Breathing and Patient connected to face mask oxgen  Post-op Assessment:  Report given to PACU RN and Post -op Vital signs reviewed and stable  Post vital signs:  Reviewed and stable  Last Vitals:  Filed Vitals:   10/18/14 1009  BP: 153/95  Pulse: 70  Temp: 36.6 C  Resp: 15    Complications: No apparent anesthesia complications

## 2014-10-18 NOTE — H&P (Signed)
Date of Initial H&P: 09/18/14.  History reviewed, patient examined, no change in status, stable for surgery.  

## 2014-10-18 NOTE — Anesthesia Postprocedure Evaluation (Signed)
  Anesthesia Post-op Note  Patient: Nicholas Burke  Procedure(s) Performed: Procedure(s) (LRB): COLONOSCOPY WITH PROPOFOL (N/A) HOT HEMOSTASIS (ARGON PLASMA COAGULATION/BICAP) (N/A)  Patient Location: PACU  Anesthesia Type: MAC  Level of Consciousness: awake and alert   Airway and Oxygen Therapy: Patient Spontanous Breathing  Post-op Pain: mild  Post-op Assessment: Post-op Vital signs reviewed, Patient's Cardiovascular Status Stable, Respiratory Function Stable, Patent Airway and No signs of Nausea or vomiting  Last Vitals:  Filed Vitals:   10/18/14 1220  BP: 150/92  Pulse: 71  Temp:   Resp: 15    Post-op Vital Signs: stable   Complications: No apparent anesthesia complications

## 2014-10-18 NOTE — Anesthesia Preprocedure Evaluation (Signed)
Anesthesia Evaluation  Patient identified by MRN, date of birth, ID band Patient awake    Reviewed: Allergy & Precautions, NPO status , Patient's Chart, lab work & pertinent test results  Airway Mallampati: II  TM Distance: >3 FB Neck ROM: Full    Dental no notable dental hx.    Pulmonary neg pulmonary ROS,  breath sounds clear to auscultation  Pulmonary exam normal       Cardiovascular hypertension, Pt. on medications Normal cardiovascular examRhythm:Regular Rate:Normal     Neuro/Psych negative neurological ROS  negative psych ROS   GI/Hepatic negative GI ROS, Neg liver ROS,   Endo/Other  negative endocrine ROS  Renal/GU negative Renal ROS  negative genitourinary   Musculoskeletal negative musculoskeletal ROS (+)   Abdominal   Peds negative pediatric ROS (+)  Hematology negative hematology ROS (+)   Anesthesia Other Findings   Reproductive/Obstetrics negative OB ROS                             Anesthesia Physical Anesthesia Plan  ASA: II  Anesthesia Plan: MAC   Post-op Pain Management:    Induction: Intravenous  Airway Management Planned: Natural Airway  Additional Equipment:   Intra-op Plan:   Post-operative Plan:   Informed Consent: I have reviewed the patients History and Physical, chart, labs and discussed the procedure including the risks, benefits and alternatives for the proposed anesthesia with the patient or authorized representative who has indicated his/her understanding and acceptance.   Dental advisory given  Plan Discussed with: CRNA  Anesthesia Plan Comments:         Anesthesia Quick Evaluation

## 2014-10-18 NOTE — Op Note (Signed)
[facility] [place], [address]   COLONOSCOPY PROCEDURE REPORT     EXAM DATE: 11-04-14  PATIENT NAME:      Nicholas, Burke           MR #:      [id number] BIRTHDATE:       08-May-1945      VISIT #:     [id number]  ATTENDING:     Wilford Corner, MD     STATUS:     outpatient REFERRING MD: ASA CLASS:        Class II  INDICATIONS:  The patient is a 68 yr old male here for a colonoscopy due to screening. PROCEDURE PERFORMED:     Colonoscopy with control of bleeding and Colonoscopy with snare polypectomy MEDICATIONS:     Monitored anesthesia care and Per Anesthesia  ESTIMATED BLOOD LOSS:     None  CONSENT: The patient understands the risks and benefits of the procedure and understands that these risks include, but are not limited to: sedation, allergic reaction, infection, perforation and/or bleeding. Alternative means of evaluation and treatment include, among others: physical exam, x-rays, and/or surgical intervention. The patient elects to proceed with this endoscopic procedure.  DESCRIPTION OF PROCEDURE: During intra-op preparation period all mechanical & medical equipment was checked for proper function. Hand hygiene and appropriate measures for infection prevention was taken. After the risks, benefits and alternatives of the procedure were thoroughly explained, Informed consent was verified, confirmed and timeout was successfully executed by the treatment team. A digital exam revealed no abnormalities of the rectum.      The Pentax Ped Colon [id number] endoscope was introduced through the anus and advanced to the cecum, which was identified by both the appendix and ileocecal valve. The prep was good.. The instrument was then slowly withdrawn as the colon was fully examined. Estimated blood loss is zero unless otherwise noted in this procedure report. A 5 mm sessile polyp seen in the ascending colon and removed with snare cautery.  Segmental area of telangiectasias noted in the distal rectum consistent with radiation proctitis with a small amount of bleeding noted from them. S/P Argon plasma coagulation with fulguration of the radiation proctitis and hemostasis achieved.      Retroflexed views revealed small internal hemorrhoids.  The scope was then completely withdrawn from the patient and the procedure terminated.     ADVERSE EVENTS:      There were no immediate complications.   IMPRESSIONS:     Ascending colon polyp - s/p snare cautery Radiation proctitis - s/p APC (see above) Internal hemorrhoids  RECOMMENDATIONS:     F/U on path; No aspirin products for 2 weeks   Wilford Corner, MD eSigned:  Wilford Corner, MD 11-04-14 12:09 PM   cc:  CPT CODES: ICD CODES:  The ICD and CPT codes recommended by this software are interpretations from the data that the clinical staff has captured with the software.  The verification of the translation of this report to the ICD and CPT codes and modifiers is the sole responsibility of the health care institution and practicing physician where this report was generated.  [place]. will not be held responsible for the validity of the ICD and CPT codes included on this report.  AMA assumes no liability for data contained or not contained herein. CPT is a Designer, television/film set of the [employer].  PATIENT NAME:  Nicholas, Burke MR#: [id number]

## 2014-10-18 NOTE — Discharge Instructions (Addendum)
No aspirin or ibuprofen products for 2 weeks. Will call you when the pathology results are complete.

## 2014-10-19 ENCOUNTER — Encounter (HOSPITAL_COMMUNITY): Payer: Self-pay | Admitting: Gastroenterology

## 2016-05-11 ENCOUNTER — Emergency Department (HOSPITAL_BASED_OUTPATIENT_CLINIC_OR_DEPARTMENT_OTHER)
Admission: EM | Admit: 2016-05-11 | Discharge: 2016-05-12 | Disposition: A | Discharge status: Home / Self Care | Payer: Medicare Other | Attending: Emergency Medicine | Admitting: Emergency Medicine

## 2016-05-11 ENCOUNTER — Encounter (HOSPITAL_BASED_OUTPATIENT_CLINIC_OR_DEPARTMENT_OTHER): Payer: Self-pay | Admitting: Emergency Medicine

## 2016-05-11 DIAGNOSIS — J069 Acute upper respiratory infection, unspecified: Secondary | ICD-10-CM | POA: Diagnosis not present

## 2016-05-11 DIAGNOSIS — Z8546 Personal history of malignant neoplasm of prostate: Secondary | ICD-10-CM | POA: Diagnosis not present

## 2016-05-11 DIAGNOSIS — R509 Fever, unspecified: Secondary | ICD-10-CM | POA: Diagnosis present

## 2016-05-11 DIAGNOSIS — I10 Essential (primary) hypertension: Secondary | ICD-10-CM | POA: Diagnosis not present

## 2016-05-11 MED ORDER — IBUPROFEN 800 MG PO TABS
800.0000 mg | ORAL_TABLET | Freq: Once | ORAL | Status: AC
  Administered 2016-05-12: 800 mg via ORAL
  Filled 2016-05-11: qty 1

## 2016-05-11 NOTE — ED Provider Notes (Signed)
[facility] DEPT [facility] Provider Note   CSN: [id number] Arrival date & time: 05/11/16  2245  By signing my name below, I, Julien Nordmann, attest that this documentation has been prepared under the direction and in the presence of Isla Pence, MD.  Electronically Signed: Julien Nordmann, ED Scribe. 05/11/16. 11:57 PM.    History   Chief Complaint Chief Complaint  Patient presents with  . Chills    The history is provided by the patient. No language interpreter was used.   HPI Comments: Nicholas Burke is a 71 y.o. male who has a h/o HTN and prostate cancer presents to the Emergency Department complaining of sudden onset, moderate chills that began earlier today. Pt reports associated rhinorrhea, low grade fever, and sore throat. He expresses pain with swallowing. Pt has not been around any sick contacts. No medication has been taken to alleviate his symptoms. He denies dysuria, cough, congestion, or abdominal pain. Pt is a non-smoker.  Past Medical History:  Diagnosis Date  . Hx of radiation therapy 01/02/13- 02/24/13   prostate 7800 cGy/40 sessions, seminal vesicles 5600 cGy/40 sessions  . Hypertension   . Prostate cancer ([place]) 08/30/12   gleason 6, vol 65.7 cc    Patient Active Problem List   Diagnosis Date Noted  . Special screening for malignant neoplasms, colon 10/18/2014  . Hx of radiation therapy   . Prostate cancer ([place]) 11/03/2012    Past Surgical History:  Procedure Laterality Date  . CATARACT EXTRACTION, BILATERAL Bilateral   . COLONOSCOPY WITH PROPOFOL N/A 10/18/2014   Procedure: COLONOSCOPY WITH PROPOFOL;  Surgeon: Wilford Corner, MD;  Location: [facility];  Service: Endoscopy;  Laterality: N/A;  . EYE SURGERY Bilateral    cataract surgery  . HOT HEMOSTASIS N/A 10/18/2014   Procedure: HOT HEMOSTASIS (ARGON PLASMA COAGULATION/BICAP);  Surgeon: Wilford Corner, MD;  Location: [facility];  Service: Endoscopy;  Laterality: N/A;  . PROSTATE BIOPSY  08/2012        Home Medications    Prior to Admission medications   Medication Sig Start Date End Date Taking? Authorizing Provider  diltiazem (DILACOR XR) 240 MG 24 hr capsule Take 240 mg by mouth daily.    Historical Provider, MD  potassium chloride (KLOR-CON) 20 MEQ packet Take 20 mEq by mouth daily.     Historical Provider, MD  quinapril-hydrochlorothiazide (ACCURETIC) 20-25 MG per tablet Take 1 tablet by mouth daily.    Historical Provider, MD    Family History Family History  Problem Relation Age of Onset  . Prostate cancer Father   . Prostate cancer Brother     Social History Social History  Substance Use Topics  . Smoking status: Never Smoker  . Smokeless tobacco: Never Used  . Alcohol use No     Allergies   Patient has no known allergies.   Review of Systems Review of Systems  A complete 10 system review of systems was obtained and all systems are negative except as noted in the HPI and PMH.   Physical Exam Updated Vital Signs BP 156/82 (BP Location: Right Arm)   Pulse 100   Temp 99.4 F (37.4 C) (Oral)   Resp 18   Ht 5\' 10"  (1.778 m)   Wt 210 lb (95.3 kg)   SpO2 98%   BMI 30.13 kg/m   Physical Exam  Constitutional: He is oriented to person, place, and time. He appears well-developed and well-nourished. No distress.  HENT:  Head: Normocephalic and atraumatic.  Right Ear: External ear normal.  Left Ear: External ear normal.  Nose: Nose normal.  Mouth/Throat: Mucous membranes are normal. No trismus in the jaw.  Oropharynx is slightly erythematous  Eyes: Conjunctivae and EOM are normal. No scleral icterus.  Neck: Normal range of motion and phonation normal.  Cardiovascular: Normal rate and regular rhythm.   Pulmonary/Chest: Effort normal and breath sounds normal. No stridor. No respiratory distress.  Abdominal: He exhibits no distension.  Musculoskeletal: Normal range of motion. He exhibits no edema.  Neurological: He is alert and oriented to person,  place, and time.  Skin: He is not diaphoretic.  Psychiatric: He has a normal mood and affect. His behavior is normal.  Vitals reviewed.    ED Treatments / Results  DIAGNOSTIC STUDIES: Oxygen Saturation is 98% on RA, normal by my interpretation.  COORDINATION OF CARE:  11:56 PM Discussed treatment plan with pt at bedside and pt agreed to plan.  Labs (all labs ordered are listed, but only abnormal results are displayed) Labs Reviewed  RAPID STREP SCREEN (NOT AT [facility])  CULTURE, GROUP A STREP ([place])  URINALYSIS, ROUTINE W REFLEX MICROSCOPIC    EKG  EKG Interpretation None       Radiology Dg Chest 2 View  Result Date: 05/12/2016 CLINICAL DATA:  Fever and chills without chest pain cough or dyspnea EXAM: CHEST  2 VIEW COMPARISON:  None. FINDINGS: The heart size and mediastinal contours are within normal limits. The thoracic aorta is ectatic and tortuous in appearance as before. No pulmonary consolidation, CHF, pneumothorax nor effusion. The visualized skeletal structures are unremarkable. IMPRESSION: No active cardiopulmonary disease. Tortuous thoracic aorta as before. Electronically Signed   By: Ashley Royalty M.D.   On: 05/12/2016 00:27    Procedures Procedures (including critical care time)  Medications Ordered in ED Medications  ibuprofen (ADVIL,MOTRIN) tablet 800 mg (800 mg Oral Given 05/12/16 0009)     Initial Impression / Assessment and Plan / ED Course  I have reviewed the triage vital signs and the nursing notes.  Pertinent labs & imaging results that were available during my care of the patient were reviewed by me and considered in my medical decision making (see chart for details).  Clinical Course     Pt looks nontoxic.  He is instructed to take tylenol and ibuprofen.  Return if worse.  Final Clinical Impressions(s) / ED Diagnoses   Final diagnoses:  Viral upper respiratory tract infection    New Prescriptions New Prescriptions   No medications on file   I personally performed the services described in this documentation, which was scribed in my presence. The recorded information has been reviewed and is accurate.    Isla Pence, MD 05/12/16 0040

## 2016-05-11 NOTE — ED Triage Notes (Signed)
Patient states that he has had a fever and chills starting today. Denies sore throat reports that it is dry. Denies a cough

## 2016-05-12 ENCOUNTER — Emergency Department (HOSPITAL_BASED_OUTPATIENT_CLINIC_OR_DEPARTMENT_OTHER): Payer: Medicare Other

## 2016-05-12 LAB — URINALYSIS, ROUTINE W REFLEX MICROSCOPIC
Bilirubin Urine: NEGATIVE
GLUCOSE, UA: NEGATIVE mg/dL
HGB URINE DIPSTICK: NEGATIVE
Ketones, ur: NEGATIVE mg/dL
Leukocytes, UA: NEGATIVE
Nitrite: NEGATIVE
PH: 6 (ref 5.0–8.0)
PROTEIN: NEGATIVE mg/dL
Specific Gravity, Urine: 1.009 (ref 1.005–1.030)

## 2016-05-12 LAB — RAPID STREP SCREEN (MED CTR MEBANE ONLY): Streptococcus, Group A Screen (Direct): NEGATIVE

## 2016-05-12 NOTE — Discharge Instructions (Signed)
Tylenol and ibuprofen for fever. °

## 2016-05-12 NOTE — ED Notes (Signed)
Patient transported to X-ray 

## 2016-05-14 LAB — CULTURE, GROUP A STREP (THRC)

## 2017-04-09 ENCOUNTER — Encounter (HOSPITAL_COMMUNITY): Payer: Self-pay | Admitting: Emergency Medicine

## 2017-04-09 ENCOUNTER — Other Ambulatory Visit: Payer: Self-pay

## 2017-04-09 ENCOUNTER — Emergency Department (HOSPITAL_COMMUNITY): Payer: Medicare Other

## 2017-04-09 DIAGNOSIS — R079 Chest pain, unspecified: Secondary | ICD-10-CM | POA: Insufficient documentation

## 2017-04-09 DIAGNOSIS — I10 Essential (primary) hypertension: Secondary | ICD-10-CM | POA: Insufficient documentation

## 2017-04-09 DIAGNOSIS — Z79899 Other long term (current) drug therapy: Secondary | ICD-10-CM | POA: Diagnosis not present

## 2017-04-09 LAB — CBC
HEMATOCRIT: 43.4 % (ref 39.0–52.0)
Hemoglobin: 14.4 g/dL (ref 13.0–17.0)
MCH: 27.6 pg (ref 26.0–34.0)
MCHC: 33.2 g/dL (ref 30.0–36.0)
MCV: 83.3 fL (ref 78.0–100.0)
PLATELETS: 121 10*3/uL — AB (ref 150–400)
RBC: 5.21 MIL/uL (ref 4.22–5.81)
RDW: 14.1 % (ref 11.5–15.5)
WBC: 5.2 10*3/uL (ref 4.0–10.5)

## 2017-04-09 LAB — BASIC METABOLIC PANEL
Anion gap: 9 (ref 5–15)
BUN: 10 mg/dL (ref 6–20)
CHLORIDE: 107 mmol/L (ref 101–111)
CO2: 24 mmol/L (ref 22–32)
CREATININE: 1.04 mg/dL (ref 0.61–1.24)
Calcium: 8.9 mg/dL (ref 8.9–10.3)
GFR calc Af Amer: >60 mL/min (ref >60)
GFR calc non Af Amer: >60 mL/min (ref >60)
GLUCOSE: 147 mg/dL — AB (ref 65–99)
POTASSIUM: 3.5 mmol/L (ref 3.5–5.1)
Sodium: 140 mmol/L (ref 135–145)

## 2017-04-09 LAB — I-STAT TROPONIN, ED: Troponin i, poc: 0.01 ng/mL (ref 0.00–0.08)

## 2017-04-09 NOTE — ED Triage Notes (Signed)
Pt c/o chest tightness that started 2 days ago. Today, tightness changed to an ache with radiation down the left arm. Denies other associated symptoms

## 2017-04-10 ENCOUNTER — Emergency Department (HOSPITAL_COMMUNITY)
Admission: EM | Admit: 2017-04-10 | Discharge: 2017-04-10 | Disposition: A | Discharge status: Home / Self Care | Payer: Medicare Other | Attending: Emergency Medicine | Admitting: Emergency Medicine

## 2017-04-10 DIAGNOSIS — R079 Chest pain, unspecified: Secondary | ICD-10-CM

## 2017-04-10 LAB — I-STAT TROPONIN, ED: TROPONIN I, POC: 0 ng/mL (ref 0.00–0.08)

## 2017-04-10 NOTE — Discharge Instructions (Signed)
Call the [facility] cardiology clinic to arrange a follow-up appointment if your symptoms are not improving by Monday.  Return to the emergency department if you develop worsening pain, difficulty breathing, or other new and concerning symptoms.

## 2017-04-10 NOTE — ED Provider Notes (Signed)
[facility] EMERGENCY DEPARTMENT Provider Note   CSN: [id number] Arrival date & time: 04/09/17  2022     History   Chief Complaint Chief Complaint  Patient presents with  . Chest Pain    HPI Nicholas Burke is a 71 y.o. male.  Patient is a 71 year old male with past medical history of hypertension and prostate cancer.  He presents today for evaluation of chest pressure.  He states that this is been ongoing for the past several days.  He denies any shortness of breath, nausea, or diaphoresis.  He does report this evening that his left hand began to hurt, but did notice a bruise to the palm.  He reports he has been out shoveling snow during the recent storm and denies experiencing symptoms while doing this.  He denies any leg swelling.  He denies any prior cardiac history.  He has never had stents, or heart cath, a stress test, or a cardiology consultation.  He has the lone cardiac risk factor of hypertension.   The history is provided by the patient.    Past Medical History:  Diagnosis Date  . Hx of radiation therapy 01/02/13- 02/24/13   prostate 7800 cGy/40 sessions, seminal vesicles 5600 cGy/40 sessions  . Hypertension   . Prostate cancer ([place]) 08/30/12   gleason 6, vol 65.7 cc    Patient Active Problem List   Diagnosis Date Noted  . Special screening for malignant neoplasms, colon 10/18/2014  . Hx of radiation therapy   . Prostate cancer ([place]) 11/03/2012    Past Surgical History:  Procedure Laterality Date  . CATARACT EXTRACTION, BILATERAL Bilateral   . COLONOSCOPY WITH PROPOFOL N/A 10/18/2014   Procedure: COLONOSCOPY WITH PROPOFOL;  Surgeon: Wilford Corner, MD;  Location: [facility];  Service: Endoscopy;  Laterality: N/A;  . EYE SURGERY Bilateral    cataract surgery  . HOT HEMOSTASIS N/A 10/18/2014   Procedure: HOT HEMOSTASIS (ARGON PLASMA COAGULATION/BICAP);  Surgeon: Wilford Corner, MD;  Location: [facility];  Service: Endoscopy;  Laterality:  N/A;  . PROSTATE BIOPSY  08/2012       Home Medications    Prior to Admission medications   Medication Sig Start Date End Date Taking? Authorizing Provider  diltiazem (DILACOR XR) 240 MG 24 hr capsule Take 240 mg by mouth daily.    [provider]  potassium chloride (KLOR-CON) 20 MEQ packet Take 20 mEq by mouth daily.     [provider]  quinapril-hydrochlorothiazide (ACCURETIC) 20-25 MG per tablet Take 1 tablet by mouth daily.    [provider]    Family History Family History  Problem Relation Age of Onset  . Prostate cancer Father   . Prostate cancer Brother     Social History Social History   Tobacco Use  . Smoking status: Never Smoker  . Smokeless tobacco: Never Used  Substance Use Topics  . Alcohol use: No  . Drug use: No     Allergies   Patient has no known allergies.   Review of Systems Review of Systems  All other systems reviewed and are negative.    Physical Exam Updated Vital Signs BP (!) 142/91   Pulse 60   Temp 97.9 F (36.6 C) (Oral)   Resp 16   Ht 5\' 10"  (1.778 m)   Wt 93 kg (205 lb)   SpO2 100%   BMI 29.41 kg/m   Physical Exam  Constitutional: He is oriented to person, place, and time. He appears well-developed and well-nourished.  No distress.  HENT:  Head: Normocephalic and atraumatic.  Mouth/Throat: Oropharynx is clear and moist.  Neck: Normal range of motion. Neck supple.  Cardiovascular: Normal rate and regular rhythm. Exam reveals no friction rub.  No murmur heard. Pulmonary/Chest: Effort normal and breath sounds normal. No respiratory distress. He has no wheezes. He has no rales.  Abdominal: Soft. Bowel sounds are normal. He exhibits no distension. There is no tenderness.  Musculoskeletal: Normal range of motion. He exhibits no edema.  Neurological: He is alert and oriented to person, place, and time. Coordination normal.  Skin: Skin is warm and dry. He is not diaphoretic.  Nursing note and  vitals reviewed.    ED Treatments / Results  Labs (all labs ordered are listed, but only abnormal results are displayed) Labs Reviewed  BASIC METABOLIC PANEL - Abnormal; Notable for the following components:      Result Value   Glucose, Bld 147 (*)    All other components within normal limits  CBC - Abnormal; Notable for the following components:   Platelets 121 (*)    All other components within normal limits  I-STAT TROPONIN, ED  I-STAT TROPONIN, ED    EKG  EKG Interpretation  Date/Time:  Friday April 09 2017 20:30:35 EST Ventricular Rate:  81 PR Interval:  180 QRS Duration: 86 QT Interval:  414 QTC Calculation: 480 R Axis:   66 Text Interpretation:  Normal sinus rhythm Prolonged QT Abnormal ECG Confirmed by Veryl Speak [phone]) on 04/10/2017 3:37:47 AM       Radiology Dg Chest 2 View  Result Date: 04/09/2017 CLINICAL DATA:  Chest tightness. EXAM: CHEST  2 VIEW COMPARISON:  05/12/2016 FINDINGS: The heart size and mediastinal contours are within normal limits. Both lungs are clear. The visualized skeletal structures are unremarkable. IMPRESSION: No active cardiopulmonary disease. Electronically Signed   By: Kerby Moors M.D.   On: 04/09/2017 21:27    Procedures Procedures (including critical care time)  Medications Ordered in ED Medications - No data to display   Initial Impression / Assessment and Plan / ED Course  I have reviewed the triage vital signs and the nursing notes.  Pertinent labs & imaging results that were available during my care of the patient were reviewed by me and considered in my medical decision making (see chart for details).  Cardiac enzymes x2 are negative.  Patient has no prior cardiac history and only risk factor is hypertension.  At this point, I feel as though he is appropriate for discharge with outpatient follow-up.  He will be given the cardiology clinic number and advised to make an appointment to discuss a stress test.  He  does understand to return if his symptoms worsen or change.  Final Clinical Impressions(s) / ED Diagnoses   Final diagnoses:  None    ED Discharge Orders    None       Veryl Speak, MD 04/10/17 [phone]

## 2017-11-25 ENCOUNTER — Encounter: Payer: Self-pay | Admitting: Dietician

## 2017-11-25 ENCOUNTER — Encounter: Payer: Medicare Other | Attending: Family Medicine | Admitting: Dietician

## 2017-11-25 DIAGNOSIS — Z713 Dietary counseling and surveillance: Secondary | ICD-10-CM | POA: Insufficient documentation

## 2017-11-25 DIAGNOSIS — E119 Type 2 diabetes mellitus without complications: Secondary | ICD-10-CM | POA: Diagnosis present

## 2017-11-25 NOTE — Progress Notes (Signed)
Patient was seen on 11/25/17 for the first of a series of three diabetes self-management courses at [facility].  Patient Education Plan per assessed needs and concerns is to attend three course education program for Diabetes Self Management Education.  The following learning objectives were met by the patient during this class:  Describe diabetes  State some common risk factors for diabetes  Defines the role of glucose and insulin  Identifies type of diabetes and pathophysiology  Describe the relationship between diabetes and cardiovascular risk  State the members of the Healthcare Team  States the rationale for glucose monitoring  State when to test glucose  State their individual Target Range  State the importance of logging glucose readings  Describe how to interpret glucose readings  Identifies A1C target  Explain the correlation between A1c and eAG values  State symptoms and treatment of high blood glucose  State symptoms and treatment of low blood glucose  Explain proper technique for glucose testing  Identifies proper sharps disposal  Handouts given during class include:  ADA Diabetes You Take Control   Carb Counting and Meal Planning book  Meal Plan Card  Meal planning worksheet  Low Sodium Flavoring Tips  Types of Fats  The diabetes portion plate  A1c to eAG Conversion Chart  Diabetes Recommended Care Schedule  Support Group  Diabetes Success Plan  Core Class Satisfaction Survey   Follow-Up Plan:  Attend core 2   

## 2017-12-02 ENCOUNTER — Encounter: Payer: Self-pay | Admitting: Dietician

## 2017-12-02 ENCOUNTER — Encounter: Payer: Medicare Other | Admitting: Dietician

## 2017-12-02 DIAGNOSIS — Z713 Dietary counseling and surveillance: Secondary | ICD-10-CM | POA: Diagnosis not present

## 2017-12-02 DIAGNOSIS — E119 Type 2 diabetes mellitus without complications: Secondary | ICD-10-CM

## 2017-12-02 NOTE — Progress Notes (Signed)
Patient was seen on 12/02/17 for the second of a series of three diabetes self-management courses at the [facility]. The following learning objectives were met by the patient during this class:   Describe the role of different macronutrients on glucose  Explain how carbohydrates affect blood glucose  State what foods contain the most carbohydrates  Demonstrate carbohydrate counting  Demonstrate how to read Nutrition Facts food label  Describe effects of various fats on heart health  Describe the importance of good nutrition for health and healthy eating strategies  Describe techniques for managing your shopping, cooking and meal planning  List strategies to follow meal plan when dining out  Describe the effects of alcohol on glucose and how to use it safely  Goals:  Follow Diabetes Meal Plan as instructed  Aim to spread carbs evenly throughout the day  Aim for 3 meals per day and snacks as needed Include lean protein foods to meals/snacks  Monitor glucose levels as instructed by your doctor   Follow-Up Plan:  Attend Core 3  Work towards following your personal food plan.   

## 2017-12-09 ENCOUNTER — Encounter: Payer: Self-pay | Admitting: Dietician

## 2017-12-09 ENCOUNTER — Encounter: Payer: Medicare Other | Admitting: Dietician

## 2017-12-09 DIAGNOSIS — Z713 Dietary counseling and surveillance: Secondary | ICD-10-CM | POA: Diagnosis not present

## 2017-12-09 DIAGNOSIS — E119 Type 2 diabetes mellitus without complications: Secondary | ICD-10-CM

## 2017-12-09 NOTE — Progress Notes (Signed)
Patient was seen on 12/09/17 for the third of a series of three diabetes self-management courses at the [facility].   Nicholas Burke the amount of activity recommended for healthy living . Describe activities suitable for individual needs . Identify ways to regularly incorporate activity into daily life . Identify barriers to activity and ways to over come these barriers  Identify diabetes medications being personally used and their primary action for lowering glucose and possible side effects . Describe role of stress on blood glucose and develop strategies to address psychosocial issues . Identify diabetes complications and ways to prevent them  Explain how to manage diabetes during illness . Evaluate success in meeting personal goal . Establish 2-3 goals that they will plan to diligently work on  Goals:   I will count my carb choices at most meals and snacks  I will be active 30 minutes or more 3 times a week  I will take my diabetes medications as scheduled  Your patient has identified these potential barriers to change:  None  Your patient has identified their diabetes self-care support plan as  Family Support    Plan:  Attend Support Group as desired

## 2019-02-16 ENCOUNTER — Emergency Department (HOSPITAL_COMMUNITY): Payer: Medicare Other

## 2019-02-16 ENCOUNTER — Encounter (HOSPITAL_COMMUNITY): Payer: Self-pay | Admitting: Emergency Medicine

## 2019-02-16 ENCOUNTER — Emergency Department (HOSPITAL_COMMUNITY)
Admission: EM | Admit: 2019-02-16 | Discharge: 2019-02-16 | Disposition: A | Discharge status: Home / Self Care | Payer: Medicare Other | Attending: Emergency Medicine | Admitting: Emergency Medicine

## 2019-02-16 ENCOUNTER — Other Ambulatory Visit: Payer: Self-pay

## 2019-02-16 DIAGNOSIS — E876 Hypokalemia: Secondary | ICD-10-CM | POA: Insufficient documentation

## 2019-02-16 DIAGNOSIS — R072 Precordial pain: Secondary | ICD-10-CM | POA: Diagnosis not present

## 2019-02-16 DIAGNOSIS — I10 Essential (primary) hypertension: Secondary | ICD-10-CM | POA: Insufficient documentation

## 2019-02-16 DIAGNOSIS — Z79899 Other long term (current) drug therapy: Secondary | ICD-10-CM | POA: Diagnosis not present

## 2019-02-16 DIAGNOSIS — Z7984 Long term (current) use of oral hypoglycemic drugs: Secondary | ICD-10-CM | POA: Diagnosis not present

## 2019-02-16 DIAGNOSIS — E119 Type 2 diabetes mellitus without complications: Secondary | ICD-10-CM | POA: Diagnosis not present

## 2019-02-16 DIAGNOSIS — R0789 Other chest pain: Secondary | ICD-10-CM | POA: Diagnosis present

## 2019-02-16 LAB — CBC
HCT: 48.4 % (ref 39.0–52.0)
Hemoglobin: 15.6 g/dL (ref 13.0–17.0)
MCH: 27.3 pg (ref 26.0–34.0)
MCHC: 32.2 g/dL (ref 30.0–36.0)
MCV: 84.8 fL (ref 80.0–100.0)
Platelets: 254 10*3/uL (ref 150–400)
RBC: 5.71 MIL/uL (ref 4.22–5.81)
RDW: 13.8 % (ref 11.5–15.5)
WBC: 8 10*3/uL (ref 4.0–10.5)
nRBC: 0 % (ref 0.0–0.2)

## 2019-02-16 LAB — BASIC METABOLIC PANEL
Anion gap: 11 (ref 5–15)
BUN: 11 mg/dL (ref 8–23)
CO2: 25 mmol/L (ref 22–32)
Calcium: 8.9 mg/dL (ref 8.9–10.3)
Chloride: 104 mmol/L (ref 98–111)
Creatinine, Ser: 1.07 mg/dL (ref 0.61–1.24)
GFR calc Af Amer: >60 mL/min (ref >60)
GFR calc non Af Amer: >60 mL/min (ref >60)
Glucose, Bld: 148 mg/dL — ABNORMAL HIGH (ref 70–99)
Potassium: 2.9 mmol/L — ABNORMAL LOW (ref 3.5–5.1)
Sodium: 140 mmol/L (ref 135–145)

## 2019-02-16 LAB — TROPONIN I (HIGH SENSITIVITY)
Troponin I (High Sensitivity): 9 ng/L (ref <18)
Troponin I (High Sensitivity): 10 ng/L (ref <18)

## 2019-02-16 LAB — PROTIME-INR
INR: 1 (ref 0.8–1.2)
Prothrombin Time: 12.9 seconds (ref 11.4–15.2)

## 2019-02-16 MED ORDER — POTASSIUM CHLORIDE CRYS ER 20 MEQ PO TBCR
20.0000 meq | EXTENDED_RELEASE_TABLET | Freq: Once | ORAL | Status: AC
  Administered 2019-02-16: 07:00:00 20 meq via ORAL
  Filled 2019-02-16: qty 1

## 2019-02-16 MED ORDER — POTASSIUM CHLORIDE CRYS ER 20 MEQ PO TBCR
40.0000 meq | EXTENDED_RELEASE_TABLET | Freq: Once | ORAL | Status: AC
  Administered 2019-02-16: 40 meq via ORAL
  Filled 2019-02-16: qty 2

## 2019-02-16 MED ORDER — POTASSIUM CHLORIDE ER 20 MEQ PO TBCR: 20.0000 meq | EXTENDED_RELEASE_TABLET | Freq: Every day | ORAL | 0 refills | Status: DC

## 2019-02-16 MED ORDER — SODIUM CHLORIDE 0.9% FLUSH: 3.0000 mL | Freq: Once | INTRAVENOUS | Status: DC

## 2019-02-16 NOTE — ED Provider Notes (Signed)
[facility] EMERGENCY DEPARTMENT Provider Note   CSN: [id number] Arrival date & time: 02/16/19  0304     History   Chief Complaint Chief Complaint  Patient presents with  . Chest Pain    HPI Nicholas Burke is a 73 y.o. male.  HPI: A 73 year old patient with a history of hypertension presents for evaluation of chest pain. Initial onset of pain was more than 6 hours ago. The patient's chest pain is described as heaviness/pressure/tightness and is not worse with exertion. The patient's chest pain is not middle- or left-sided, is not well-localized, is not sharp and does not radiate to the arms/jaw/neck. The patient does not complain of nausea and denies diaphoresis. The patient has no history of stroke, has no history of peripheral artery disease, has not smoked in the past 90 days, denies any history of treated diabetes, has no relevant family history of coronary artery disease (first degree relative at less than age 52), has no history of hypercholesterolemia and does not have an elevated BMI (>=30).   HPI Patient presents for chest pain.  Patient reports that over the past 1 to 2 days he has had continuous chest tightness.  He goes to bed and wakes up with pain.  It does seem to worsen at night.  He reports there is some worsening with movement of his upper body. He denies any other associated symptoms.  He denies any fever/vomiting/shortness of breath.  No diaphoresis or nausea vomiting  Seen by his PCP over a week ago.  At that time he seemed to have a muscle strain of his chest and was prescribed medications with improvement Past Medical History:  Diagnosis Date  . Diabetes mellitus without complication ([place])   . Hx of radiation therapy 01/02/13- 02/24/13   prostate 7800 cGy/40 sessions, seminal vesicles 5600 cGy/40 sessions  . Hypertension   . Prostate cancer ([place]) 08/30/12   gleason 6, vol 65.7 cc    Patient Active Problem List   Diagnosis Date Noted  . Special  screening for malignant neoplasms, colon 10/18/2014  . Hx of radiation therapy   . Prostate cancer ([place]) 11/03/2012    Past Surgical History:  Procedure Laterality Date  . CATARACT EXTRACTION, BILATERAL Bilateral   . COLONOSCOPY WITH PROPOFOL N/A 10/18/2014   Procedure: COLONOSCOPY WITH PROPOFOL;  Surgeon: Wilford Corner, MD;  Location: [facility] ENDOSCOPY;  Service: Endoscopy;  Laterality: N/A;  . EYE SURGERY Bilateral    cataract surgery  . HOT HEMOSTASIS N/A 10/18/2014   Procedure: HOT HEMOSTASIS (ARGON PLASMA COAGULATION/BICAP);  Surgeon: Wilford Corner, MD;  Location: [facility];  Service: Endoscopy;  Laterality: N/A;  . PROSTATE BIOPSY  08/2012        Home Medications    Prior to Admission medications   Medication Sig Start Date End Date Taking? Authorizing Provider  diltiazem (DILACOR XR) 240 MG 24 hr capsule Take 240 mg by mouth daily.    [provider]  potassium chloride (KLOR-CON) 20 MEQ packet Take 20 mEq by mouth daily.     [provider]  quinapril-hydrochlorothiazide (ACCURETIC) 20-25 MG per tablet Take 1 tablet by mouth daily.    [provider]    Family History Family History  Problem Relation Age of Onset  . Prostate cancer Father   . Prostate cancer Brother     Social History Social History   Tobacco Use  . Smoking status: Never Smoker  . Smokeless tobacco: Never Used  Substance Use Topics  . Alcohol  use: No  . Drug use: No     Allergies   Patient has no known allergies.   Review of Systems Review of Systems  Constitutional: Negative for diaphoresis and fever.  Respiratory: Negative for cough and shortness of breath.   Cardiovascular: Positive for chest pain. Negative for leg swelling.  Gastrointestinal: Negative for nausea and vomiting.  Neurological: Negative for weakness.  All other systems reviewed and are negative.    Physical Exam Updated Vital Signs BP (!) 158/90 (BP Location: Right Arm)   Pulse 61    Temp 98 F (36.7 C) (Oral)   Resp 16   SpO2 97%   Physical Exam CONSTITUTIONAL: Well developed/well nourished, no acute distress HEAD: Normocephalic/atraumatic EYES: EOMI ENMT: Mucous membranes moist NECK: supple no meningeal signs SPINE/BACK:entire spine nontender CV: S1/S2 noted, no murmurs/rubs/gallops noted LUNGS: Lungs are clear to auscultation bilaterally, no apparent distress Chest--no tenderness to palpation of chest, no pain is reproducible with movement of his torso ABDOMEN: soft, nontender, no rebound or guarding, bowel sounds noted throughout abdomen GU:no cva tenderness NEURO: Pt is awake/alert/appropriate, moves all extremitiesx4.  No facial droop.   EXTREMITIES: pulses normal/equalx4, full ROM, no lower extremity edema or tenderness SKIN: warm, color normal PSYCH: no abnormalities of mood noted, alert and oriented to situation  ED Treatments / Results  Labs (all labs ordered are listed, but only abnormal results are displayed) Labs Reviewed  BASIC METABOLIC PANEL - Abnormal; Notable for the following components:      Result Value   Potassium 2.9 (*)    Glucose, Bld 148 (*)    All other components within normal limits  CBC  PROTIME-INR  TROPONIN I (HIGH SENSITIVITY)  TROPONIN I (HIGH SENSITIVITY)    EKG EKG Interpretation  Date/Time:  Thursday February 16 2019 06:17:02 EDT Ventricular Rate:  71 PR Interval:  184 QRS Duration: 92 QT Interval:  445 QTC Calculation: 484 R Axis:   56 Text Interpretation:  Sinus rhythm Posterior infarct, old Baseline wander in lead(s) V1 Confirmed by Ripley Fraise [phone]) on 02/16/2019 6:20:37 AM   Radiology Dg Chest 2 View  Result Date: 02/16/2019 CLINICAL DATA:  Chest pain/tightness. Palpitations. EXAM: CHEST - 2 VIEW COMPARISON:  Radiograph 04/10/2017 FINDINGS: The cardiomediastinal contours are unchanged. Heart is normal in size. The lungs are clear. Pulmonary vasculature is normal. No consolidation, pleural  effusion, or pneumothorax. No acute osseous abnormalities are seen. IMPRESSION: No acute abnormality or change from prior exam. Electronically Signed   By: Keith Rake M.D.   On: 02/16/2019 03:46    Procedures Procedures  Medications Ordered in ED Medications  sodium chloride flush (NS) 0.9 % injection 3 mL (has no administration in time range)  potassium chloride SA (KLOR-CON) CR tablet 40 mEq (40 mEq Oral Given 02/16/19 0702)  potassium chloride SA (KLOR-CON) CR tablet 20 mEq (20 mEq Oral Given 02/16/19 0702)     Initial Impression / Assessment and Plan / ED Course  I have reviewed the triage vital signs and the nursing notes.  Pertinent labs & imaging results that were available during my care of the patient were reviewed by me and considered in my medical decision making (see chart for details).     HEAR Score: 3  Pt with HEAR score of 3 Initial troponin unremarkable Pain is reproducible on exam without associated symptoms Plan to repeat troponin  Will also treat mild hypokalemia 7:03 AM Signed out to Dr. Roslynn Amble to f/u on troponin   Final Clinical Impressions(s) /  ED Diagnoses   Final diagnoses:  Precordial pain  Hypokalemia    ED Discharge Orders         Ordered    potassium chloride 20 MEQ TBCR  Daily     02/16/19 [id number]           Ripley Fraise, MD 02/16/19 [phone]

## 2019-02-16 NOTE — ED Triage Notes (Addendum)
Patient reports central chest pain/tightness with palpitations onset last week , denies SOB , no emesis or diaphoresis , hypertensive at triage. Marland Kitchen

## 2019-02-16 NOTE — Discharge Instructions (Signed)

## 2019-04-12 ENCOUNTER — Other Ambulatory Visit (HOSPITAL_COMMUNITY): Payer: Self-pay | Admitting: Family Medicine

## 2019-04-12 DIAGNOSIS — R079 Chest pain, unspecified: Secondary | ICD-10-CM

## 2019-04-14 ENCOUNTER — Encounter (HOSPITAL_COMMUNITY): Payer: Self-pay | Admitting: Family Medicine

## 2019-04-29 ENCOUNTER — Other Ambulatory Visit (HOSPITAL_COMMUNITY): Payer: Medicare Other

## 2019-05-02 ENCOUNTER — Other Ambulatory Visit (HOSPITAL_COMMUNITY)
Admission: RE | Admit: 2019-05-02 | Discharge: 2019-05-02 | Disposition: A | Discharge status: Home / Self Care | Payer: Medicare Other | Source: Ambulatory Visit | Attending: Family Medicine | Admitting: Family Medicine

## 2019-05-02 ENCOUNTER — Encounter (HOSPITAL_COMMUNITY): Payer: Medicare Other

## 2019-05-02 DIAGNOSIS — Z20822 Contact with and (suspected) exposure to covid-19: Secondary | ICD-10-CM | POA: Insufficient documentation

## 2019-05-02 DIAGNOSIS — Z01812 Encounter for preprocedural laboratory examination: Secondary | ICD-10-CM | POA: Diagnosis present

## 2019-05-03 ENCOUNTER — Telehealth (HOSPITAL_COMMUNITY): Payer: Self-pay

## 2019-05-03 NOTE — Telephone Encounter (Signed)
Encounter complete. 

## 2019-05-04 ENCOUNTER — Telehealth (HOSPITAL_COMMUNITY): Payer: Self-pay

## 2019-05-04 LAB — NOVEL CORONAVIRUS, NAA (HOSP ORDER, SEND-OUT TO REF LAB; TAT 18-24 HRS): SARS-CoV-2, NAA: NOT DETECTED

## 2019-05-04 NOTE — Telephone Encounter (Signed)
Encounter complete. 

## 2019-05-05 ENCOUNTER — Ambulatory Visit (HOSPITAL_COMMUNITY)
Admission: RE | Admit: 2019-05-05 | Discharge: 2019-05-05 | Disposition: A | Discharge status: Home / Self Care | Payer: Medicare Other | Source: Ambulatory Visit | Attending: Cardiovascular Disease | Admitting: Cardiovascular Disease

## 2019-05-05 ENCOUNTER — Other Ambulatory Visit: Payer: Self-pay

## 2019-05-05 DIAGNOSIS — R079 Chest pain, unspecified: Secondary | ICD-10-CM

## 2019-05-05 LAB — EXERCISE TOLERANCE TEST
Estimated workload: 7.3 METS
Exercise duration (min): 6 min
Exercise duration (sec): 16 s
MPHR: 148 {beats}/min
Peak HR: 127 {beats}/min
Percent HR: 85 %
Rest HR: 71 {beats}/min

## 2020-01-23 ENCOUNTER — Other Ambulatory Visit: Payer: Medicare Other

## 2020-01-23 ENCOUNTER — Ambulatory Visit: Payer: Medicare Other | Attending: Internal Medicine

## 2020-01-23 DIAGNOSIS — Z23 Encounter for immunization: Secondary | ICD-10-CM

## 2020-01-23 NOTE — Progress Notes (Signed)
   Covid-19 Vaccination Clinic  Name:  Nicholas Burke    MRN: [id number] DOB: 1945/11/15  01/23/2020  [name] was observed post Covid-19 immunization for 15 minutes without incident. He was provided with Vaccine Information Sheet and instruction to access the V-Safe system.   [name] was instructed to call 911 with any severe reactions post vaccine: Marland Kitchen Difficulty breathing  . Swelling of face and throat  . A fast heartbeat  . A bad rash all over body  . Dizziness and weakness   Immunizations Administered    Name Date Dose VIS Date Route   Pfizer COVID-19 Vaccine 01/23/2020 12:10 PM 0.3 mL 06/21/2018 Intramuscular   Manufacturer: [place]   Lot: [id number]   [place]: [id number]

## 2020-04-29 DIAGNOSIS — H401131 Primary open-angle glaucoma, bilateral, mild stage: Secondary | ICD-10-CM | POA: Diagnosis not present

## 2020-04-29 DIAGNOSIS — E1159 Type 2 diabetes mellitus with other circulatory complications: Secondary | ICD-10-CM | POA: Diagnosis not present

## 2020-04-29 DIAGNOSIS — I10 Essential (primary) hypertension: Secondary | ICD-10-CM | POA: Diagnosis not present

## 2020-05-21 DIAGNOSIS — H9312 Tinnitus, left ear: Secondary | ICD-10-CM | POA: Diagnosis not present

## 2020-06-19 ENCOUNTER — Other Ambulatory Visit (HOSPITAL_COMMUNITY): Payer: Self-pay | Admitting: Urology

## 2020-06-19 DIAGNOSIS — R9721 Rising PSA following treatment for malignant neoplasm of prostate: Secondary | ICD-10-CM

## 2020-06-25 DIAGNOSIS — H401131 Primary open-angle glaucoma, bilateral, mild stage: Secondary | ICD-10-CM | POA: Diagnosis not present

## 2020-06-25 DIAGNOSIS — E119 Type 2 diabetes mellitus without complications: Secondary | ICD-10-CM | POA: Diagnosis not present

## 2020-06-25 DIAGNOSIS — H04123 Dry eye syndrome of bilateral lacrimal glands: Secondary | ICD-10-CM | POA: Diagnosis not present

## 2020-07-03 ENCOUNTER — Ambulatory Visit (HOSPITAL_COMMUNITY)
Admission: RE | Admit: 2020-07-03 | Discharge: 2020-07-03 | Disposition: A | Discharge status: Home / Self Care | Payer: Medicare Other | Source: Ambulatory Visit | Attending: Urology | Admitting: Urology

## 2020-07-03 ENCOUNTER — Other Ambulatory Visit: Payer: Self-pay

## 2020-07-03 DIAGNOSIS — R9721 Rising PSA following treatment for malignant neoplasm of prostate: Secondary | ICD-10-CM | POA: Diagnosis not present

## 2020-07-03 MED ORDER — PIFLIFOLASTAT F 18 (PYLARIFY) INJECTION
9.0000 | Freq: Once | INTRAVENOUS | Status: AC
  Administered 2020-07-03: 8.6 via INTRAVENOUS

## 2020-11-01 DIAGNOSIS — H401131 Primary open-angle glaucoma, bilateral, mild stage: Secondary | ICD-10-CM | POA: Diagnosis not present

## 2020-11-01 DIAGNOSIS — Z1389 Encounter for screening for other disorder: Secondary | ICD-10-CM | POA: Diagnosis not present

## 2020-11-01 DIAGNOSIS — E1159 Type 2 diabetes mellitus with other circulatory complications: Secondary | ICD-10-CM | POA: Diagnosis not present

## 2020-11-01 DIAGNOSIS — I10 Essential (primary) hypertension: Secondary | ICD-10-CM | POA: Diagnosis not present

## 2020-11-01 DIAGNOSIS — Z0000 Encounter for general adult medical examination without abnormal findings: Secondary | ICD-10-CM | POA: Diagnosis not present

## 2020-11-01 DIAGNOSIS — L989 Disorder of the skin and subcutaneous tissue, unspecified: Secondary | ICD-10-CM | POA: Diagnosis not present

## 2020-11-25 ENCOUNTER — Other Ambulatory Visit: Payer: Self-pay | Admitting: Urology

## 2020-11-25 DIAGNOSIS — C61 Malignant neoplasm of prostate: Secondary | ICD-10-CM

## 2020-12-02 DIAGNOSIS — L280 Lichen simplex chronicus: Secondary | ICD-10-CM | POA: Diagnosis not present

## 2020-12-04 ENCOUNTER — Other Ambulatory Visit: Payer: Self-pay | Admitting: Urology

## 2020-12-08 ENCOUNTER — Ambulatory Visit
Admission: RE | Admit: 2020-12-08 | Discharge: 2020-12-08 | Disposition: A | Discharge status: Home / Self Care | Payer: Medicare Other | Source: Ambulatory Visit | Attending: Urology | Admitting: Urology

## 2020-12-08 DIAGNOSIS — C61 Malignant neoplasm of prostate: Secondary | ICD-10-CM

## 2020-12-08 MED ORDER — GADOBENATE DIMEGLUMINE 529 MG/ML IV SOLN
19.0000 mL | Freq: Once | INTRAVENOUS | Status: AC | PRN
  Administered 2020-12-08: 19 mL via INTRAVENOUS

## 2020-12-11 NOTE — Progress Notes (Addendum)
COVID swab appointment: N/A  COVID Vaccine Completed:  Yes x4 Date COVID Vaccine completed:  Has received booster:  Yes x2 COVID vaccine manufacturer: [place]      Date of COVID positive in last 90 days:  December 07, 2020 by home test  PCP - Gaynelle Arabian, MD.  Office notes on chart Cardiologist - NA  Chest x-ray - N/A EKG - 12-17-20 Epic Stress Test - 05-05-19 Epic ECHO - greater than 2 years Cardiac Cath -  Pacemaker/ICD device last checked: Spinal Cord Stimulator:  Sleep Study - N/A CPAP -   Fasting Blood Sugar -  N/A Checks Blood Sugar _____ times a day  Blood Thinner Instructions: N/A Aspirin Instructions: Last Dose:  Activity level:  Can go up a flight of stairs and perform activities of daily living without stopping and without symptoms of chest pain or shortness of breath.  Able to exercise without symptoms   Anesthesia review: Chest pain evaluation  by stress test 2021 ordered by PCP.  Patient states no recurrence of chest pain  Potassium 3.0 on PAT labs  Patient denies shortness of breath, fever, cough and chest pain at PAT appointment   Patient verbalized understanding of instructions that were given to them at the PAT appointment. Patient was also instructed that they will need to review over the PAT instructions again at home before surgery.

## 2020-12-11 NOTE — Patient Instructions (Addendum)
DUE TO COVID-19 ONLY ONE VISITOR IS ALLOWED TO COME WITH YOU AND STAY IN THE WAITING ROOM ONLY DURING PRE OP AND PROCEDURE.   **NO VISITORS ARE ALLOWED IN THE SHORT STAY AREA OR RECOVERY ROOM!!**         Your procedure is scheduled on:  Tuesday, 12-24-20   Report to [facility] Main  Entrance   Report to admitting at 7:30 AM   Call this number if you have problems the morning of surgery [phone]   Do not eat food :After Midnight.   May have liquids until 6:45 AM AM  day of surgery  CLEAR LIQUID DIET  Foods Allowed                                                                     Foods Excluded  Water, Black Coffee (no milk/ no creamer)  and tea, regular and decaf                  liquids that you cannot  Plain Jell-O in any flavor  (No red)                                     see through such as: Fruit ices (not with fruit pulp)                                      milk, soups, orange juice              Iced Popsicles (No red)                                      All solid food                                   Apple juices Sports drinks like Gatorade (No red) Lightly seasoned clear broth or consume(fat free) Sugar     Oral Hygiene is also important to reduce your risk of infection.                                    Remember - BRUSH YOUR TEETH THE MORNING OF SURGERY WITH YOUR REGULAR TOOTHPASTE   Do NOT smoke after Midnight   Take these medicines the morning of surgery with A SIP OF WATER:  Diltiazem, Finasteride, Rosuvastatin                               You may not have any metal on your body including  jewelry, and body piercing             Do not wear  lotions, powders, cologne, or deodorant               Men may shave face and neck.   Do not  bring valuables to the hospital. [place].   Contacts, dentures or bridgework may not be worn into surgery.   Patients discharged the day of surgery will not be  allowed to drive home.    Please read over the following fact sheets you were given: IF YOU HAVE QUESTIONS ABOUT YOUR PRE OP INSTRUCTIONS PLEASE CALL [facility] - Preparing for Surgery Before surgery, you can play an important role.  Because skin is not sterile, your skin needs to be as free of germs as possible.  You can reduce the number of germs on your skin by washing with CHG (chlorahexidine gluconate) soap before surgery.  CHG is an antiseptic cleaner which kills germs and bonds with the skin to continue killing germs even after washing. Please DO NOT use if you have an allergy to CHG or antibacterial soaps.  If your skin becomes reddened/irritated stop using the CHG and inform your nurse when you arrive at Short Stay. Do not shave (including legs and underarms) for at least 48 hours prior to the first CHG shower.  You may shave your face/neck.  Please follow these instructions carefully:  1.  Shower with CHG Soap the night before surgery and the  morning of surgery.  2.  If you choose to wash your hair, wash your hair first as usual with your normal  shampoo.  3.  After you shampoo, rinse your hair and body thoroughly to remove the shampoo.                             4.  Use CHG as you would any other liquid soap.  You can apply chg directly to the skin and wash.  Gently with a scrungie or clean washcloth.  5.  Apply the CHG Soap to your body ONLY FROM THE NECK DOWN.   Do   not use on face/ open                           Wound or open sores. Avoid contact with eyes, ears mouth and   genitals (private parts).                       Wash face,  Genitals (private parts) with your normal soap.             6.  Wash thoroughly, paying special attention to the area where your    surgery  will be performed.  7.  Thoroughly rinse your body with warm water from the neck down.  8.  DO NOT shower/wash with your normal soap after using and rinsing off the CHG Soap.                 9.  Pat yourself dry with a clean towel.            10.  Wear clean pajamas.            11.  Place clean sheets on your bed the night of your first shower and do not  sleep with pets. Day of Surgery : Do not apply any lotions/deodorants the morning of surgery.  Please wear clean clothes to the hospital/surgery center.  FAILURE TO FOLLOW THESE INSTRUCTIONS MAY RESULT IN THE CANCELLATION OF YOUR SURGERY  PATIENT SIGNATURE_________________________________  NURSE SIGNATURE__________________________________  ________________________________________________________________________

## 2020-12-17 ENCOUNTER — Encounter (HOSPITAL_COMMUNITY)
Admission: RE | Admit: 2020-12-17 | Discharge: 2020-12-17 | Disposition: A | Discharge status: Home / Self Care | Payer: Medicare Other | Source: Ambulatory Visit | Attending: Urology | Admitting: Urology

## 2020-12-17 ENCOUNTER — Encounter (HOSPITAL_COMMUNITY): Payer: Self-pay

## 2020-12-17 ENCOUNTER — Other Ambulatory Visit: Payer: Self-pay

## 2020-12-17 DIAGNOSIS — Z01818 Encounter for other preprocedural examination: Secondary | ICD-10-CM | POA: Insufficient documentation

## 2020-12-17 HISTORY — DX: Prediabetes: R73.03

## 2020-12-17 LAB — CBC
HCT: 48.5 % (ref 39.0–52.0)
Hemoglobin: 15.9 g/dL (ref 13.0–17.0)
MCH: 27.5 pg (ref 26.0–34.0)
MCHC: 32.8 g/dL (ref 30.0–36.0)
MCV: 83.9 fL (ref 80.0–100.0)
Platelets: 277 10*3/uL (ref 150–400)
RBC: 5.78 MIL/uL (ref 4.22–5.81)
RDW: 13.8 % (ref 11.5–15.5)
WBC: 7.2 10*3/uL (ref 4.0–10.5)
nRBC: 0 % (ref 0.0–0.2)

## 2020-12-17 LAB — HEMOGLOBIN A1C
Hgb A1c MFr Bld: 6.8 % — ABNORMAL HIGH (ref 4.8–5.6)
Mean Plasma Glucose: 148.46 mg/dL

## 2020-12-17 LAB — BASIC METABOLIC PANEL
Anion gap: 9 (ref 5–15)
BUN: 14 mg/dL (ref 8–23)
CO2: 26 mmol/L (ref 22–32)
Calcium: 8.9 mg/dL (ref 8.9–10.3)
Chloride: 104 mmol/L (ref 98–111)
Creatinine, Ser: 0.95 mg/dL (ref 0.61–1.24)
GFR, Estimated: >60 mL/min (ref >60)
Glucose, Bld: 115 mg/dL — ABNORMAL HIGH (ref 70–99)
Potassium: 3 mmol/L — ABNORMAL LOW (ref 3.5–5.1)
Sodium: 139 mmol/L (ref 135–145)

## 2020-12-24 ENCOUNTER — Encounter (HOSPITAL_COMMUNITY): Payer: Self-pay | Admitting: Anesthesiology

## 2020-12-24 ENCOUNTER — Ambulatory Visit (HOSPITAL_COMMUNITY): Payer: Medicare Other | Admitting: Certified Registered"

## 2020-12-24 ENCOUNTER — Ambulatory Visit (HOSPITAL_COMMUNITY): Payer: Medicare Other | Admitting: Physician Assistant

## 2020-12-24 ENCOUNTER — Encounter (HOSPITAL_COMMUNITY)
Admission: RE | Disposition: A | Discharge status: Home / Self Care | Payer: Self-pay | Source: Home / Self Care | Attending: Urology

## 2020-12-24 ENCOUNTER — Ambulatory Visit (HOSPITAL_COMMUNITY)
Admission: RE | Admit: 2020-12-24 | Discharge: 2020-12-24 | Disposition: A | Discharge status: Home / Self Care | Payer: Medicare Other | Attending: Urology | Admitting: Urology

## 2020-12-24 ENCOUNTER — Ambulatory Visit: Admit: 2020-12-24 | Payer: Medicare Other | Admitting: Urology

## 2020-12-24 ENCOUNTER — Encounter (HOSPITAL_COMMUNITY): Payer: Self-pay | Admitting: Urology

## 2020-12-24 DIAGNOSIS — Z79899 Other long term (current) drug therapy: Secondary | ICD-10-CM | POA: Diagnosis not present

## 2020-12-24 DIAGNOSIS — Z923 Personal history of irradiation: Secondary | ICD-10-CM | POA: Insufficient documentation

## 2020-12-24 DIAGNOSIS — Z8042 Family history of malignant neoplasm of prostate: Secondary | ICD-10-CM | POA: Insufficient documentation

## 2020-12-24 DIAGNOSIS — I10 Essential (primary) hypertension: Secondary | ICD-10-CM | POA: Diagnosis not present

## 2020-12-24 DIAGNOSIS — C61 Malignant neoplasm of prostate: Secondary | ICD-10-CM | POA: Diagnosis present

## 2020-12-24 HISTORY — PX: CRYOABLATION: SHX1415

## 2020-12-24 LAB — GLUCOSE, CAPILLARY: Glucose-Capillary: 115 mg/dL — ABNORMAL HIGH (ref 70–99)

## 2020-12-24 SURGERY — CRYOABLATION, PROSTATE
Anesthesia: General | Site: Prostate

## 2020-12-24 SURGERY — CRYOABLATION, PROSTATE
Anesthesia: General

## 2020-12-24 MED ORDER — LACTATED RINGERS IV SOLN: INTRAVENOUS | Status: DC

## 2020-12-24 MED ORDER — STERILE WATER FOR IRRIGATION IR SOLN
Status: DC | PRN
  Administered 2020-12-24: 500 mL

## 2020-12-24 MED ORDER — CEFAZOLIN SODIUM-DEXTROSE 2-4 GM/100ML-% IV SOLN
2.0000 g | Freq: Once | INTRAVENOUS | Status: AC
  Administered 2020-12-24: 2 g via INTRAVENOUS
  Filled 2020-12-24: qty 100

## 2020-12-24 MED ORDER — EPHEDRINE 5 MG/ML INJ
INTRAVENOUS | Status: AC
  Filled 2020-12-24: qty 5

## 2020-12-24 MED ORDER — ONDANSETRON HCL 4 MG/2ML IJ SOLN
INTRAMUSCULAR | Status: DC | PRN
  Administered 2020-12-24: 4 mg via INTRAVENOUS

## 2020-12-24 MED ORDER — HYDROMORPHONE HCL 1 MG/ML IJ SOLN
INTRAMUSCULAR | Status: AC
  Filled 2020-12-24: qty 1

## 2020-12-24 MED ORDER — PROPOFOL 10 MG/ML IV BOLUS
INTRAVENOUS | Status: DC | PRN
  Administered 2020-12-24: 150 mg via INTRAVENOUS
  Administered 2020-12-24 (×2): 50 mg via INTRAVENOUS

## 2020-12-24 MED ORDER — ONDANSETRON HCL 4 MG/2ML IJ SOLN
INTRAMUSCULAR | Status: AC
  Filled 2020-12-24: qty 2

## 2020-12-24 MED ORDER — CHLORHEXIDINE GLUCONATE 0.12 % MT SOLN
15.0000 mL | Freq: Once | OROMUCOSAL | Status: AC
  Administered 2020-12-24: 15 mL via OROMUCOSAL

## 2020-12-24 MED ORDER — ORAL CARE MOUTH RINSE: 15.0000 mL | Freq: Once | OROMUCOSAL | Status: AC

## 2020-12-24 MED ORDER — EPHEDRINE SULFATE-NACL 50-0.9 MG/10ML-% IV SOSY
PREFILLED_SYRINGE | INTRAVENOUS | Status: DC | PRN
  Administered 2020-12-24 (×2): 5 mg via INTRAVENOUS

## 2020-12-24 MED ORDER — HYDROMORPHONE HCL 1 MG/ML IJ SOLN
0.2500 mg | INTRAMUSCULAR | Status: DC | PRN
  Administered 2020-12-24 (×3): 0.5 mg via INTRAVENOUS

## 2020-12-24 MED ORDER — LIDOCAINE 2% (20 MG/ML) 5 ML SYRINGE
INTRAMUSCULAR | Status: AC
  Filled 2020-12-24: qty 5

## 2020-12-24 MED ORDER — MEPERIDINE HCL 50 MG/ML IJ SOLN: 6.2500 mg | INTRAMUSCULAR | Status: DC | PRN

## 2020-12-24 MED ORDER — PROPOFOL 10 MG/ML IV BOLUS
INTRAVENOUS | Status: AC
  Filled 2020-12-24: qty 20

## 2020-12-24 MED ORDER — BACITRACIN ZINC 500 UNIT/GM EX OINT
TOPICAL_OINTMENT | CUTANEOUS | Status: AC
  Filled 2020-12-24: qty 28.35

## 2020-12-24 MED ORDER — FENTANYL CITRATE (PF) 100 MCG/2ML IJ SOLN
INTRAMUSCULAR | Status: AC
  Filled 2020-12-24: qty 2

## 2020-12-24 MED ORDER — SUGAMMADEX SODIUM 200 MG/2ML IV SOLN
INTRAVENOUS | Status: DC | PRN
  Administered 2020-12-24: 200 mg via INTRAVENOUS

## 2020-12-24 MED ORDER — ROCURONIUM BROMIDE 10 MG/ML (PF) SYRINGE
PREFILLED_SYRINGE | INTRAVENOUS | Status: DC | PRN
  Administered 2020-12-24 (×3): 10 mg via INTRAVENOUS
  Administered 2020-12-24: 50 mg via INTRAVENOUS

## 2020-12-24 MED ORDER — TAMSULOSIN HCL 0.4 MG PO CAPS: 0.4000 mg | ORAL_CAPSULE | Freq: Every day | ORAL | 0 refills | Status: DC

## 2020-12-24 MED ORDER — FENTANYL CITRATE (PF) 250 MCG/5ML IJ SOLN
INTRAMUSCULAR | Status: DC | PRN
  Administered 2020-12-24 (×3): 50 ug via INTRAVENOUS

## 2020-12-24 MED ORDER — NITROFURANTOIN MONOHYD MACRO 100 MG PO CAPS: 100.0000 mg | ORAL_CAPSULE | Freq: Every day | ORAL | 0 refills | Status: AC

## 2020-12-24 MED ORDER — PROMETHAZINE HCL 25 MG/ML IJ SOLN: 6.2500 mg | INTRAMUSCULAR | Status: DC | PRN

## 2020-12-24 MED ORDER — DEXAMETHASONE SODIUM PHOSPHATE 10 MG/ML IJ SOLN
INTRAMUSCULAR | Status: AC
  Filled 2020-12-24: qty 1

## 2020-12-24 MED ORDER — DEXAMETHASONE SODIUM PHOSPHATE 10 MG/ML IJ SOLN
INTRAMUSCULAR | Status: DC | PRN
  Administered 2020-12-24: 4 mg via INTRAVENOUS

## 2020-12-24 MED ORDER — 0.9 % SODIUM CHLORIDE (POUR BTL) OPTIME
TOPICAL | Status: DC | PRN
  Administered 2020-12-24: 1000 mL

## 2020-12-24 MED ORDER — LIDOCAINE 2% (20 MG/ML) 5 ML SYRINGE
INTRAMUSCULAR | Status: DC | PRN
  Administered 2020-12-24: 80 mg via INTRAVENOUS

## 2020-12-24 MED ORDER — SODIUM CHLORIDE 0.9 % IR SOLN
Status: DC | PRN
  Administered 2020-12-24: 2000 mL via INTRAVESICAL

## 2020-12-24 SURGICAL SUPPLY — 28 items
BAG DRN RND TRDRP ANRFLXCHMBR (UROLOGICAL SUPPLIES) ×1
BAG URINE DRAIN 2000ML AR STRL (UROLOGICAL SUPPLIES) ×3 IMPLANT
BNDG GAUZE ELAST 4 BULKY (GAUZE/BANDAGES/DRESSINGS) ×1 IMPLANT
CATH FOLEY 2WAY SLVR  5CC 18FR (CATHETERS) ×1
CATH FOLEY 2WAY SLVR 5CC 18FR (CATHETERS) ×2 IMPLANT
COVER SURGICAL LIGHT HANDLE (MISCELLANEOUS) ×3 IMPLANT
DRAPE SURG IRRIG POUCH 19X23 (DRAPES) ×2 IMPLANT
DRAPE U-SHAPE 47X51 STRL (DRAPES) ×1 IMPLANT
DRSG TEGADERM 4X4.75 (GAUZE/BANDAGES/DRESSINGS) ×1 IMPLANT
DRSG TEGADERM 8X12 (GAUZE/BANDAGES/DRESSINGS) ×3 IMPLANT
GAS ARGON HIGH PRESSURE (MEDICAL GASES) ×3 IMPLANT
GAS HELIUM HIGH PRESSURE (MEDICAL GASES) ×3 IMPLANT
GLOVE SURG ENC TEXT LTX SZ7.5 (GLOVE) ×6 IMPLANT
GLOVE SURG UNDER POLY LF SZ6.5 (GLOVE) ×4 IMPLANT
GLOVE SURG UNDER POLY LF SZ7 (GLOVE) ×4 IMPLANT
GOWN STRL REUS W/TWL LRG LVL3 (GOWN DISPOSABLE) ×2 IMPLANT
GOWN STRL REUS W/TWL XL LVL3 (GOWN DISPOSABLE) ×5 IMPLANT
GUIDEWIRE AMPLATZ STIFF 0.35 (WIRE) ×3 IMPLANT
IV NS 1000ML (IV SOLUTION) ×4
IV NS 1000ML BAXH (IV SOLUTION) ×2 IMPLANT
KIT TURNOVER KIT A (KITS) ×3 IMPLANT
PACK CYSTO (CUSTOM PROCEDURE TRAY) ×3 IMPLANT
PANTS MESH DISP LRG (UNDERPADS AND DIAPERS) ×1 IMPLANT
PANTS MESH DISPOSABLE L (UNDERPADS AND DIAPERS)
PLUG CATH AND CAP STER (CATHETERS) ×1 IMPLANT
SHEET LAVH (DRAPES) ×3 IMPLANT
TOWEL OR 17X26 10 PK STRL BLUE (TOWEL DISPOSABLE) ×3 IMPLANT
WATER STERILE IRR 500ML POUR (IV SOLUTION) ×2 IMPLANT

## 2020-12-24 SURGICAL SUPPLY — 32 items
BAG DRN RND TRDRP ANRFLXCHMBR (UROLOGICAL SUPPLIES) ×1
BAG URINE DRAIN 2000ML AR STRL (UROLOGICAL SUPPLIES) ×2 IMPLANT
BNDG GAUZE ELAST 4 BULKY (GAUZE/BANDAGES/DRESSINGS) ×1 IMPLANT
CATH FOLEY 2WAY SLVR  5CC 18FR (CATHETERS) ×2
CATH FOLEY 2WAY SLVR 5CC 18FR (CATHETERS) ×1 IMPLANT
COVER SURGICAL LIGHT HANDLE (MISCELLANEOUS) ×2 IMPLANT
DRAPE SURG IRRIG POUCH 19X23 (DRAPES) ×1 IMPLANT
DRAPE U-SHAPE 47X51 STRL (DRAPES) ×1 IMPLANT
DRSG TEGADERM 4X4.75 (GAUZE/BANDAGES/DRESSINGS) ×2 IMPLANT
DRSG TEGADERM 8X12 (GAUZE/BANDAGES/DRESSINGS) ×2 IMPLANT
GAS ARGON HIGH PRESSURE (MEDICAL GASES) ×2 IMPLANT
GAS HELIUM HIGH PRESSURE (MEDICAL GASES) ×2 IMPLANT
GAUZE SPONGE 4X4 12PLY STRL (GAUZE/BANDAGES/DRESSINGS) ×1 IMPLANT
GLOVE SURG ENC MOIS LTX SZ7 (GLOVE) ×1 IMPLANT
GLOVE SURG ENC TEXT LTX SZ7.5 (GLOVE) ×4 IMPLANT
GLOVE SURG UNDER POLY LF SZ6.5 (GLOVE) ×2 IMPLANT
GLOVE SURG UNDER POLY LF SZ7 (GLOVE) ×1 IMPLANT
GOWN STRL REUS W/TWL LRG LVL3 (GOWN DISPOSABLE) ×1 IMPLANT
GOWN STRL REUS W/TWL XL LVL3 (GOWN DISPOSABLE) ×4 IMPLANT
GUIDEWIRE AMPLATZ STIFF 0.35 (WIRE) ×2 IMPLANT
IV NS 1000ML (IV SOLUTION) ×4
IV NS 1000ML BAXH (IV SOLUTION) IMPLANT
KIT CRYO ENDOCARE (DISPOSABLE) ×1 IMPLANT
KIT TURNOVER KIT A (KITS) ×2 IMPLANT
NS IRRIG 1000ML POUR BTL (IV SOLUTION) ×1 IMPLANT
PACK CYSTO (CUSTOM PROCEDURE TRAY) ×2 IMPLANT
PANTS MESH DISP LRG (UNDERPADS AND DIAPERS) ×1 IMPLANT
PANTS MESH DISPOSABLE L (UNDERPADS AND DIAPERS)
PLUG CATH AND CAP STER (CATHETERS) ×1 IMPLANT
SHEET LAVH (DRAPES) ×2 IMPLANT
TOWEL OR 17X26 10 PK STRL BLUE (TOWEL DISPOSABLE) ×2 IMPLANT
WATER STERILE IRR 500ML POUR (IV SOLUTION) ×1 IMPLANT

## 2020-12-24 NOTE — Anesthesia Procedure Notes (Signed)
Procedure Name: Intubation Date/Time: 12/24/2020 1:22 PM Performed by: Eben Burow, CRNA Pre-anesthesia Checklist: Patient identified, Emergency Drugs available, Suction available, Patient being monitored and Timeout performed Patient Re-evaluated:Patient Re-evaluated prior to induction Oxygen Delivery Method: Circle system utilized Preoxygenation: Pre-oxygenation with 100% oxygen Induction Type: IV induction Ventilation: Mask ventilation without difficulty Laryngoscope Size: Mac and 4 Grade View: Grade II Tube type: Oral Tube size: 7.5 mm Number of attempts: 1 Airway Equipment and Method: Stylet Placement Confirmation: ETT inserted through vocal cords under direct vision, positive ETCO2 and breath sounds checked- equal and bilateral Secured at: 23 cm Tube secured with: Tape Dental Injury: Teeth and Oropharynx as per pre-operative assessment

## 2020-12-24 NOTE — Discharge Instructions (Signed)
INSTRUCTIONS AFTER CRYOABLATION OF THE PROSTATE  Normal Findings After Cryoablation:   You may experience a number of symptoms after the cryoablation which occur in  some patients.  There is no cause for alarm should this happen.  These    symptoms include:  -Blood in the urine.  When you are discharged after your surgery, your urine   will have some blood in it and may appear red.  This occurs to some degree in all patients after cryoablation and is not cause for alarm.  Your urine should clear approximately 24 hours after the procedure, but may persist for quite a while longer.  -Scrotal and penile swelling and bruising.  This occurs about 2-3 days after   the cryoablation and is caused by tissue swelling which temporarily blocks the drainage of lymph.  This is painless and resolves in less than one week.  Ice packs and lying down for short periods of time during the day will improve the swelling.  -Small amounts of bloody discharge from the end of your penis.  This can  occur for up to 6 weeks after the procedure and is not cause for alarm.  It is due  to some discharge from the urethra in the area of the prostate.  -Some numbness in the head of the penis.  Occasionally, when a large  amount of freezing has been performed during the procedure, the nerve which  supplies sensation to one or both sides of the head of the penis may be affected. The sensation returns after a number of months.  Call your doctor at [phone] if any of the following occur:               -If you have any pain, fever, or chills.  -If your foley catheter or suprapubic tube is not draining urine.  -If there is decreasing urinary stream.  This may indicate sloughing of some  dead tissue in the area of the prostate near the opening to the bladder.  It may clear on its own but,  if the problem becomes severe, it may require removal of the dead tissue through a cystoscope.  -If you have diarrhea after urination or foul-smelling  urine.  These   symptoms may indicate an urethrorectal fistula, which is a hole between the bladder channel and the rectum.  This should be investigated by your doctor.  -If you have any questions or problems.   Diet:  Resume your normal diet.   If you become constipated, you may try  over-the-counter remedies such as Milk of Magnesia.  If you are nauseated, vomiting or feel bloated, notify your doctor's office.  DO NOT GIVE YOURSELF ANY ENEMAS OR RECTAL MEDICATIONS.  THE RECTAL WALL IS THIN AFTER CRYOABLATION.  Activity:   -You may have swelling and bruising of the penis and scrotum.  Apply ice packs   to the area behind the scrotum intermittently for 24-48 hours after your surgery to keep the swelling down.  Wearing an athletic supporter (jock strap) might also help.  -Lying flat on your back will also help decrease the swelling.  You may find   when you are sitting up or walking around that the swelling increases.   -You will have  puncture wounds behind your scrotum making it painful to sit   down.  Use a hemorrhoid donut to sit on if needed.  -You may shower on the second day after surgery.  -There are no lifting or driving restrictions.  Use caution while the suprapubic tube   is in place.   Medications:  -You may resume your preoperative medications, except aspirin and other blood   thinning agents.  Unless otherwise instructed, resume your aspirin after your suprapubic tube is removed.  If you are taking Coumadin or other blood thinners, please discuss when to restart these medications with your surgeon.  -Unless otherwise ordered, you will be given prescriptions for the following   medications which you will need to take after your procedure:   *Antibiotics -  to prevent infection while your suprapubic tube is in place.   *Anti-inflammatory - to prevent pain and to decrease the inflammation in   the area of the prostate.  You may take ibuprofen (Motrin, Advil),    acetaminophen (Tylenol) or  naproxen (Aleve) as needed for discomfort.

## 2020-12-24 NOTE — Transfer of Care (Signed)
Immediate Anesthesia Transfer of Care Note  Patient: Nicholas Burke  Procedure(s) Performed: CRYO ABLATION PROSTATE (Prostate) CRYO ABLATION PROSTATE (Prostate)  Patient Location: PACU  Anesthesia Type:General  Level of Consciousness: awake, alert  and oriented  Airway & Oxygen Therapy: Patient Spontanous Breathing and Patient connected to face mask oxygen  Post-op Assessment: Report given to RN, Post -op Vital signs reviewed and stable and Patient moving all extremities X 4  Post vital signs: Reviewed and stable  Last Vitals:  Vitals Value Taken Time  BP 159/97   Temp    Pulse 76 12/24/20 1611  Resp 22 12/24/20 1611  SpO2 98 % 12/24/20 1611  Vitals shown include unvalidated device data.  Last Pain:  Vitals:   12/24/20 0803  TempSrc:   PainSc: 0-No pain         Complications: No notable events documented.

## 2020-12-24 NOTE — H&P (Signed)
H&P  Chief Complaint: Prostate cancer  History of Present Illness: Nicholas Burke is a 75 year old male who developed prostate cancer and was treated with radiation therapy in 2014.  His PSA did decline but recurred to 2.6 in June 2022.  He had undergone a PET scan in March 2022 which showed no metastatic disease but right prostate lobe activity consistent with recurrence.  Transrectal ultrasound of the prostate and biopsy confirmed the diagnosis May 2022 with a 30 g prostate and Gleason 4+5 = 9 in 2 cores at the right lateral mid and apex of 50 to 60%.  Given that he only had disease in the right mid to apex on biopsy and PET scan, MRI of the prostate was obtained August 2022 which confirmed a PI-RADS 4 lesion in the right prostate with possible extracapsular extension and involvement of the right neurovascular bundle.  Otherwise staging was negative with no seminal vesicle involvement and no metastatic disease.  He presents today for right cryotherapy Hemi ablation of the prostate.  He is doing well.  No dysuria or gross hematuria.  No fevers.  Past Medical History:  Diagnosis Date   Hx of radiation therapy 01/02/13- 02/24/13   prostate 7800 cGy/40 sessions, seminal vesicles 5600 cGy/40 sessions   Hypertension    Pre-diabetes    Prostate cancer ([place]) 08/30/2012   gleason 6, vol 65.7 cc   Past Surgical History:  Procedure Laterality Date   CATARACT EXTRACTION, BILATERAL Bilateral    COLONOSCOPY WITH PROPOFOL N/A 10/18/2014   Procedure: COLONOSCOPY WITH PROPOFOL;  Surgeon: Wilford Corner, MD;  Location: [facility];  Service: Endoscopy;  Laterality: N/A;   EYE SURGERY Bilateral    cataract surgery   HOT HEMOSTASIS N/A 10/18/2014   Procedure: HOT HEMOSTASIS (ARGON PLASMA COAGULATION/BICAP);  Surgeon: Wilford Corner, MD;  Location: [facility];  Service: Endoscopy;  Laterality: N/A;   PROSTATE BIOPSY  08/2012    Home Medications:  Medications Prior to Admission  Medication Sig Dispense Refill  Last Dose   diltiazem (CARDIZEM CD) 240 MG 24 hr capsule Take 240 mg by mouth daily.   12/23/2020 at 2200   finasteride (PROSCAR) 5 MG tablet Take 5 mg by mouth daily.   12/23/2020 at 2200   rosuvastatin (CRESTOR) 5 MG tablet Take 5 mg by mouth 3 (three) times a week.   12/23/2020 at 2200   Allergies: No Known Allergies  Family History  Problem Relation Age of Onset   Prostate cancer Father    Prostate cancer Brother    Social History:  reports that he has never smoked. He has never used smokeless tobacco. He reports that he does not drink alcohol and does not use drugs.  ROS: A complete review of systems was performed.  All systems are negative except for pertinent findings as noted. Review of Systems  All other systems reviewed and are negative.   Physical Exam:  Vital signs in last 24 hours: Temp:  [98.3 F (36.8 C)] 98.3 F (36.8 C) (08/30 0801) Pulse Rate:  [65] 65 (08/30 0801) Resp:  [16] 16 (08/30 0801) BP: (116)/(94) 116/94 (08/30 0801) SpO2:  [100 %] 100 % (08/30 0801) Weight:  [89.9 kg] 89.9 kg (08/30 0746) General:  Alert and oriented, No acute distress HEENT: Normocephalic, atraumatic Cardiovascular: Regular rate and rhythm Lungs: Regular rate and effort Abdomen: Soft, nontender, nondistended, no abdominal masses Back: No CVA tenderness Extremities: No edema Neurologic: Grossly intact  Laboratory Data:  Results for orders placed or performed during the hospital encounter  of 12/24/20 (from the past 24 hour(s))  Glucose, capillary     Status: Abnormal   Collection Time: 12/24/20  7:58 AM  Result Value Ref Range   Glucose-Capillary 115 (H) 70 - 99 mg/dL   No results found for this or any previous visit (from the past 240 hour(s)). Creatinine: Recent Labs    12/17/20 1003  CREATININE 0.95    Impression/Assessment/plan:  Recurrent prostate cancer - right lateral mid-apical - I discussed with the patient the nature, potential benefits, risks and alternatives  to right prostate cryotherapy hemiablation, including side effects of the proposed treatment, the likelihood of the patient achieving the goals of the procedure, and any potential problems that might occur during the procedure or recuperation. I drew him a picture of the anatomy and discussed risk of stricture, incontinence, ractal injury and fistula among others. Discussed again rationale for right sided treatment. All questions answered. Patient elects to proceed.     Festus Aloe 12/24/2020, 9:05 AM

## 2020-12-24 NOTE — Progress Notes (Signed)
Called patients daughter,Clarice, and informed her about the delay in the OR and that the patient was brought back to a short stay room to sit and wait.  Informed her the OR had equipment issues and they were handling the problem and as soon as the equipment was brought in the patient would go into surgery.  Informed Clarice estimated time of surgery now is 12-12:30 today.  Gave Clarice the patients Case # to follow on the waiting room screens.  Clarice,patients daughter, voiced understanding to all information.

## 2020-12-24 NOTE — Anesthesia Preprocedure Evaluation (Addendum)
Anesthesia Evaluation  Patient identified by MRN, date of birth, ID band Patient awake    Reviewed: Allergy & Precautions, NPO status , Patient's Chart, lab work & pertinent test results  Airway Mallampati: II  TM Distance: >3 FB Neck ROM: Full    Dental  (+) Dental Advisory Given, Teeth Intact,    Pulmonary neg pulmonary ROS,    Pulmonary exam normal breath sounds clear to auscultation       Cardiovascular hypertension, Pt. on medications Normal cardiovascular exam Rhythm:Regular Rate:Normal     Neuro/Psych negative neurological ROS  negative psych ROS   GI/Hepatic negative GI ROS, Neg liver ROS,   Endo/Other  negative endocrine ROS  Renal/GU negative Renal ROS     Musculoskeletal negative musculoskeletal ROS (+)   Abdominal   Peds  Hematology negative hematology ROS (+)   Anesthesia Other Findings   Reproductive/Obstetrics negative OB ROS                            Anesthesia Physical  Anesthesia Plan  ASA: 2  Anesthesia Plan: General   Post-op Pain Management:    Induction: Intravenous  PONV Risk Score and Plan: 4 or greater and Ondansetron, Treatment may vary due to age or medical condition, Midazolam and Dexamethasone  Airway Management Planned: Oral ETT  Additional Equipment: None  Intra-op Plan:   Post-operative Plan: Extubation in OR  Informed Consent: I have reviewed the patients History and Physical, chart, labs and discussed the procedure including the risks, benefits and alternatives for the proposed anesthesia with the patient or authorized representative who has indicated his/her understanding and acceptance.     Dental advisory given  Plan Discussed with: CRNA  Anesthesia Plan Comments:      Anesthesia Quick Evaluation

## 2020-12-24 NOTE — OR Nursing (Signed)
Equipment malfunctioned prior to procedure. Leadership notified. Equipment replaced and went through proper process sterilization and Biomed conducted proper checks prior to case start.

## 2020-12-24 NOTE — Op Note (Signed)
Preoperative diagnosis: Prostate cancer Postoperative diagnosis: Prostate cancer  Procedure: Right Hemi cryoablation of the prostate  Surgeon: Quinto Tippy  Resident surgeon: Leeroy Bock  Anesthesia: General  Indication for procedure: Nicholas Burke is a 75 year old male who had external beam radiation of the prostate in 2014.  His PSA rose to 2.7.  PET scan and prostate biopsy revealed recurrent prostate cancer in the right side of his prostate.  Follow-up MRI scan also revealed a PI-RADS 4 lesion right side of the prostate with some extension toward the neurovascular bundle possible extracapsular extension on the right.  All other staging was negative.  Findings: On DRE the prostate was hard and flat consistent with prior radiation without any specific nodule or mass palpated.  Prostate about 30 g on Intra-Op ultrasound. Excellent coverage of the right side of the prostate with ice.  No urethral or bladder injury on flexible cystoscopy after needle placement.  Description of procedure: After consent was obtained patient brought to the operating room.  After adequate anesthesia was placed lithotomy position and the external genitalia and perineum were prepped and draped in the usual sterile fashion.  Timeout was performed to confirm patient and procedure.  A 16 French Foley catheter was placed per urethra and the scrotum was taped out of the field.  Digital rectal exam was performed.  The transrectal ultrasound probe was brought into position, inserted transrectally and locked in place and the perineal template placed.  The prostate was measured and noted to be about 30 g.  4 needles were planned and then placed with #1 right anterior, #2 slightly inferior and lateral, #3 more posterior lateral, #4 more posterior and medial - this created a nice crescent shape of coverage along the right prostate.  Depth was confirmed in the sagittal plane as well as length.  Monitoring probes were placed at the external sphincter,   denonvilliers fascia and the right neurovascular bundle.  The Foley catheter was removed and cystoscopy was performed.  The urethra and the bladder were unremarkable without any needle penetration.  A Super Stiff wire was passed and the urethral warming catheter passed over the wire and into the bladder.  Urethral catheter was confirmed to be in the urethra and bladder on ultrasound.  Urethral catheter was turned on.  Needle placement depth and length of ice ball was confirmed 1 more time.  Freezing then began at #1, followed by #2, then 3 and 4.  Freezing went out to the right neurovascular bundle as planned.  External sphincter and denonvilliers were monitored and not frozen.  Freeze thaw cycle x2 was performed.  Freezing was monitored in the axial and sagittal views on ultrasound.  Needles were then actively warmed for 10 minutes and easily removed.  2 towels were placed on the perineum and pressure held for 10 minutes.  The warming catheter was removed and the 16 French Foley replaced.  Urine drainage was clear.  A perineal dressing was placed.  He was taken out of lithotomy position awakened and taken to the cover room in stable condition.  Complications: None  Blood loss: Minimal  Specimens: None  Drains: 16 French Foley catheter  Disposition: Patient stable to PACU-I called and spoke with his daughter Nicholas Burke and we went over the procedure, postop care and follow-up.

## 2020-12-24 NOTE — Progress Notes (Signed)
Patient sitting in bed watching tv.  Just used urinal, voided 471m, emptied for him. No other needs at this time.  Apologized again for the delay.  Patient calmly replied its ok.

## 2020-12-24 NOTE — Progress Notes (Addendum)
Scheduled for OR 0945 Room ready 1023 In OR 1027 Additional equipment issues 1045 brought back out from OR to short stay 17. No meds were given in the OR

## 2020-12-24 NOTE — Progress Notes (Signed)
Patients daughter, Nickolas Madrid, notified that her father had just went back to the OR for surgery.  Advised her that the doctor will call her in a few hours when the surgery is complete.  Clarice voiced understanding.

## 2020-12-25 ENCOUNTER — Encounter (HOSPITAL_COMMUNITY): Payer: Self-pay

## 2020-12-25 NOTE — Addendum Note (Signed)
Report [id number] - AN AUDIT TRAIL REPORT Print Group [phone] - An Audit Trail With Extended Information Contacts being addended:    Primary contact: [phone]   Other contact: [phone])   Other contact: [phone])    Addendum  created 12/25/20 1658 by Donnella Bi, RN   Attached Procedures edited

## 2020-12-25 NOTE — Anesthesia Postprocedure Evaluation (Signed)
Anesthesia Post Note  Patient: Nicholas Burke  Procedure(s) Performed: CRYO ABLATION PROSTATE (Prostate) CRYO ABLATION PROSTATE (Prostate)     Patient location during evaluation: PACU Anesthesia Type: General Level of consciousness: sedated and patient cooperative Pain management: pain level controlled Vital Signs Assessment: post-procedure vital signs reviewed and stable Respiratory status: spontaneous breathing Cardiovascular status: stable Anesthetic complications: no   No notable events documented.  Last Vitals:  Vitals:   12/24/20 1745 12/24/20 1810  BP: (!) 157/97 (!) 165/94  Pulse: 72 64  Resp: 16 16  Temp: 36.4 C 36.5 C  SpO2: 98% 97%    Last Pain:  Vitals:   12/24/20 1810  TempSrc:   PainSc: 0-No pain                 Nolon Nations

## 2020-12-25 NOTE — Addendum Note (Signed)
Report [id number] - AN AUDIT TRAIL REPORT Print Group [phone] - An Audit Trail With Extended Information Contacts being addended:    Primary contact: [phone]   Other contact: [phone])   Other contact: [phone])    Addendum  created 12/25/20 1657 by Donnella Bi, RN   Attached Procedures edited

## 2020-12-26 ENCOUNTER — Encounter (HOSPITAL_COMMUNITY): Payer: Self-pay | Admitting: Urology

## 2020-12-30 ENCOUNTER — Emergency Department (HOSPITAL_COMMUNITY)
Admission: EM | Admit: 2020-12-30 | Discharge: 2020-12-30 | Disposition: A | Discharge status: Home / Self Care | Payer: Medicare Other | Attending: Emergency Medicine | Admitting: Emergency Medicine

## 2020-12-30 ENCOUNTER — Other Ambulatory Visit: Payer: Self-pay

## 2020-12-30 ENCOUNTER — Encounter (HOSPITAL_COMMUNITY): Payer: Self-pay

## 2020-12-30 DIAGNOSIS — I10 Essential (primary) hypertension: Secondary | ICD-10-CM | POA: Diagnosis not present

## 2020-12-30 DIAGNOSIS — Z79899 Other long term (current) drug therapy: Secondary | ICD-10-CM | POA: Diagnosis not present

## 2020-12-30 DIAGNOSIS — Z8546 Personal history of malignant neoplasm of prostate: Secondary | ICD-10-CM | POA: Diagnosis not present

## 2020-12-30 DIAGNOSIS — T83098A Other mechanical complication of other indwelling urethral catheter, initial encounter: Secondary | ICD-10-CM | POA: Insufficient documentation

## 2020-12-30 DIAGNOSIS — R319 Hematuria, unspecified: Secondary | ICD-10-CM

## 2020-12-30 DIAGNOSIS — T83091A Other mechanical complication of indwelling urethral catheter, initial encounter: Secondary | ICD-10-CM | POA: Diagnosis not present

## 2020-12-30 NOTE — ED Notes (Signed)
An After Visit Summary was printed and given to the patient. Discharge instructions given and no further questions at this time.  Pt leaving with wife.  

## 2020-12-30 NOTE — ED Notes (Signed)
Bladder scan volume 212mL.

## 2020-12-30 NOTE — ED Notes (Signed)
Post catheter removal bladder scan volume: 202 mL.

## 2020-12-30 NOTE — ED Provider Notes (Signed)
[facility] Provider Note  CSN: [id number] Arrival date & time: 12/30/20 1937    History Chief Complaint  Patient presents with   Urinary Retention    Nicholas Burke is a 75 y.o. male with history of prostate cancer had a cryoablation 6 days ago with a foley left in place post-op. He reports he was doing well but today the catheter has not been draining well and he has been having leaking around the catheter. He denies any fever, blood in urine, abdominal pain, N/V/D. He is scheduled for Urology follow up tomorrow with plan to remove catheter then.    Past Medical History:  Diagnosis Date   Hx of radiation therapy 01/02/13- 02/24/13   prostate 7800 cGy/40 sessions, seminal vesicles 5600 cGy/40 sessions   Hypertension    Pre-diabetes    Prostate cancer ([place]) 08/30/2012   gleason 6, vol 65.7 cc    Past Surgical History:  Procedure Laterality Date   CATARACT EXTRACTION, BILATERAL Bilateral    COLONOSCOPY WITH PROPOFOL N/A 10/18/2014   Procedure: COLONOSCOPY WITH PROPOFOL;  Surgeon: Wilford Corner, MD;  Location: [facility];  Service: Endoscopy;  Laterality: N/A;   CRYOABLATION N/A 12/24/2020   Procedure: CRYO ABLATION PROSTATE;  Surgeon: Festus Aloe, MD;  Location: [facility];  Service: Urology;  Laterality: N/A;   EYE SURGERY Bilateral    cataract surgery   HOT HEMOSTASIS N/A 10/18/2014   Procedure: HOT HEMOSTASIS (ARGON PLASMA COAGULATION/BICAP);  Surgeon: Wilford Corner, MD;  Location: [facility];  Service: Endoscopy;  Laterality: N/A;   PROSTATE BIOPSY  08/2012    Family History  Problem Relation Age of Onset   Prostate cancer Father    Prostate cancer Brother     Social History   Tobacco Use   Smoking status: Never   Smokeless tobacco: Never  Vaping Use   Vaping Use: Never used  Substance Use Topics   Alcohol use: No   Drug use: No     Home Medications Prior to Admission medications   Medication Sig Start Date End Date Taking?  Authorizing Provider  diltiazem (CARDIZEM CD) 240 MG 24 hr capsule Take 240 mg by mouth daily. 08/29/20   [provider]  finasteride (PROSCAR) 5 MG tablet Take 5 mg by mouth daily. 12/03/20   [provider]  nitrofurantoin, macrocrystal-monohydrate, (MACROBID) 100 MG capsule Take 1 capsule (100 mg total) by mouth at bedtime for 7 doses. 12/24/20 12/31/20  Festus Aloe, MD  rosuvastatin (CRESTOR) 5 MG tablet Take 5 mg by mouth 3 (three) times a week. 11/21/20   [provider]  tamsulosin (FLOMAX) 0.4 MG CAPS capsule Take 1 capsule (0.4 mg total) by mouth daily after supper. 12/24/20   Festus Aloe, MD     Allergies    Patient has no known allergies.   Review of Systems   Review of Systems A comprehensive review of systems was completed and negative except as noted in HPI.    Physical Exam BP (!) 179/107   Pulse 82   Temp 98.6 F (37 C) (Oral)   Resp 15   Ht '5\' 10"'$  (1.778 m)   Wt 90.7 kg   SpO2 97%   BMI 28.70 kg/m   Physical Exam Vitals and nursing note reviewed.  Constitutional:      Appearance: Normal appearance.  HENT:     Head: Normocephalic and atraumatic.     Nose: Nose normal.     Mouth/Throat:     Mouth: Mucous membranes are  moist.  Eyes:     Extraocular Movements: Extraocular movements intact.     Conjunctiva/sclera: Conjunctivae normal.  Cardiovascular:     Rate and Rhythm: Normal rate.  Pulmonary:     Effort: Pulmonary effort is normal.     Breath sounds: Normal breath sounds.  Abdominal:     General: Abdomen is flat.     Palpations: Abdomen is soft.     Tenderness: There is no abdominal tenderness.  Genitourinary:    Comments: Foley catheter in place, no bleeding. Leaking urine around the catheter. Musculoskeletal:        General: No swelling. Normal range of motion.     Cervical back: Neck supple.  Skin:    General: Skin is warm and dry.  Neurological:     General: No focal deficit present.     Mental Status: He  is alert.  Psychiatric:        Mood and Affect: Mood normal.     ED Results / Procedures / Treatments   Labs (all labs ordered are listed, but only abnormal results are displayed) Labs Reviewed - No data to display  EKG None  Radiology No results found.  Procedures Procedures  Medications Ordered in the ED Medications - No data to display   MDM Rules/Calculators/A&P MDM Will discuss with on-call urologist if we can go ahead and remove his catheter tonight.   ED Course  I have reviewed the triage vital signs and the nursing notes.  Pertinent labs & imaging results that were available during my care of the patient were reviewed by me and considered in my medical decision making (see chart for details).  Clinical Course as of 12/30/20 2136  Mon Dec 30, 2020  2133 Spoke with Dr. Alyson Ingles with Urology who recommends removing catheter and voiding trial. Patient was able to urinate without difficulty after catheter removed but he did have some hematuria and clots. He was advised to drink plenty of fluids. Follow up with Urology tomorrow as scheduled. He understands that clots may cause obstruction in which case he would need to have his catheter replaced.  [CS]    Clinical Course User Index [CS] Truddie Hidden, MD    Final Clinical Impression(s) / ED Diagnoses Final diagnoses:  Hematuria, unspecified type  Obstruction of Foley catheter, initial encounter [facility])    Rx / DC Orders ED Discharge Orders     None        Truddie Hidden, MD 12/30/20 2136

## 2020-12-30 NOTE — ED Triage Notes (Signed)
Pt arrives with "leaking foley catheter". Pt recently seen and had a Cryo Ablation Prostate on 8/30. Says last time foley drained properly was last night.

## 2021-01-02 DIAGNOSIS — L280 Lichen simplex chronicus: Secondary | ICD-10-CM | POA: Diagnosis not present

## 2021-02-08 DIAGNOSIS — Z23 Encounter for immunization: Secondary | ICD-10-CM | POA: Diagnosis not present

## 2021-04-14 DIAGNOSIS — H918X9 Other specified hearing loss, unspecified ear: Secondary | ICD-10-CM | POA: Diagnosis not present

## 2021-04-14 DIAGNOSIS — H9312 Tinnitus, left ear: Secondary | ICD-10-CM | POA: Diagnosis not present

## 2021-04-14 DIAGNOSIS — H903 Sensorineural hearing loss, bilateral: Secondary | ICD-10-CM | POA: Diagnosis not present

## 2021-04-16 ENCOUNTER — Other Ambulatory Visit: Payer: Self-pay | Admitting: Otolaryngology

## 2021-04-16 DIAGNOSIS — H9312 Tinnitus, left ear: Secondary | ICD-10-CM

## 2021-04-16 DIAGNOSIS — H918X9 Other specified hearing loss, unspecified ear: Secondary | ICD-10-CM

## 2021-05-05 DIAGNOSIS — L723 Sebaceous cyst: Secondary | ICD-10-CM | POA: Diagnosis not present

## 2021-05-05 DIAGNOSIS — E1159 Type 2 diabetes mellitus with other circulatory complications: Secondary | ICD-10-CM | POA: Diagnosis not present

## 2021-05-05 DIAGNOSIS — H401131 Primary open-angle glaucoma, bilateral, mild stage: Secondary | ICD-10-CM | POA: Diagnosis not present

## 2021-05-05 DIAGNOSIS — I10 Essential (primary) hypertension: Secondary | ICD-10-CM | POA: Diagnosis not present

## 2021-05-07 DIAGNOSIS — L723 Sebaceous cyst: Secondary | ICD-10-CM | POA: Diagnosis not present

## 2021-05-07 DIAGNOSIS — L7211 Pilar cyst: Secondary | ICD-10-CM | POA: Diagnosis not present

## 2021-05-13 ENCOUNTER — Ambulatory Visit
Admission: RE | Admit: 2021-05-13 | Discharge: 2021-05-13 | Disposition: A | Discharge status: Home / Self Care | Payer: Medicare Other | Source: Ambulatory Visit | Attending: Otolaryngology | Admitting: Otolaryngology

## 2021-05-13 ENCOUNTER — Other Ambulatory Visit: Payer: Self-pay

## 2021-05-13 DIAGNOSIS — H9312 Tinnitus, left ear: Secondary | ICD-10-CM

## 2021-05-13 DIAGNOSIS — H918X9 Other specified hearing loss, unspecified ear: Secondary | ICD-10-CM

## 2021-05-13 DIAGNOSIS — I739 Peripheral vascular disease, unspecified: Secondary | ICD-10-CM | POA: Diagnosis not present

## 2021-05-13 MED ORDER — GADOBENATE DIMEGLUMINE 529 MG/ML IV SOLN
18.0000 mL | Freq: Once | INTRAVENOUS | Status: AC | PRN
  Administered 2021-05-13: 18 mL via INTRAVENOUS

## 2021-06-25 DIAGNOSIS — H401131 Primary open-angle glaucoma, bilateral, mild stage: Secondary | ICD-10-CM | POA: Diagnosis not present

## 2021-06-25 DIAGNOSIS — E119 Type 2 diabetes mellitus without complications: Secondary | ICD-10-CM | POA: Diagnosis not present

## 2021-06-25 DIAGNOSIS — Z961 Presence of intraocular lens: Secondary | ICD-10-CM | POA: Diagnosis not present

## 2021-08-25 DIAGNOSIS — H918X9 Other specified hearing loss, unspecified ear: Secondary | ICD-10-CM | POA: Diagnosis not present

## 2021-08-25 DIAGNOSIS — H9312 Tinnitus, left ear: Secondary | ICD-10-CM | POA: Diagnosis not present

## 2021-09-03 DIAGNOSIS — N471 Phimosis: Secondary | ICD-10-CM | POA: Diagnosis not present

## 2021-09-17 DIAGNOSIS — N471 Phimosis: Secondary | ICD-10-CM | POA: Diagnosis not present

## 2021-11-03 DIAGNOSIS — H401131 Primary open-angle glaucoma, bilateral, mild stage: Secondary | ICD-10-CM | POA: Diagnosis not present

## 2021-11-03 DIAGNOSIS — I10 Essential (primary) hypertension: Secondary | ICD-10-CM | POA: Diagnosis not present

## 2021-11-03 DIAGNOSIS — E1159 Type 2 diabetes mellitus with other circulatory complications: Secondary | ICD-10-CM | POA: Diagnosis not present

## 2021-11-03 DIAGNOSIS — Z0000 Encounter for general adult medical examination without abnormal findings: Secondary | ICD-10-CM | POA: Diagnosis not present

## 2022-01-06 DIAGNOSIS — Z23 Encounter for immunization: Secondary | ICD-10-CM | POA: Diagnosis not present

## 2022-05-13 DIAGNOSIS — H401131 Primary open-angle glaucoma, bilateral, mild stage: Secondary | ICD-10-CM | POA: Diagnosis not present

## 2022-05-13 DIAGNOSIS — I10 Essential (primary) hypertension: Secondary | ICD-10-CM | POA: Diagnosis not present

## 2022-05-13 DIAGNOSIS — E1159 Type 2 diabetes mellitus with other circulatory complications: Secondary | ICD-10-CM | POA: Diagnosis not present

## 2022-05-28 IMAGING — MR MR BRAIN/TEMPORAL BONE/IAC
11 of 12 series · 38 of 48 positions shown · IV contrast (multihance)
Comparison: Brain MRI 12/31/2000 (report only, images not
available)

CLINICAL DATA: Ringing in left ear for a year

EXAM:
MRI HEAD WITHOUT AND WITH CONTRAST
TECHNIQUE: Multiplanar, multiecho pulse sequences of the brain and surrounding
structures were obtained without and with intravenous contrast.
CONTRAST:  18mL MULTIHANCE GADOBENATE DIMEGLUMINE 529 MG/ML IV SOLN

[Series 2: T1 · sagittal · 5.0mm · 0.45mm/px · 4 of 23 slices shown (1 of 3)]
[im 1/23]
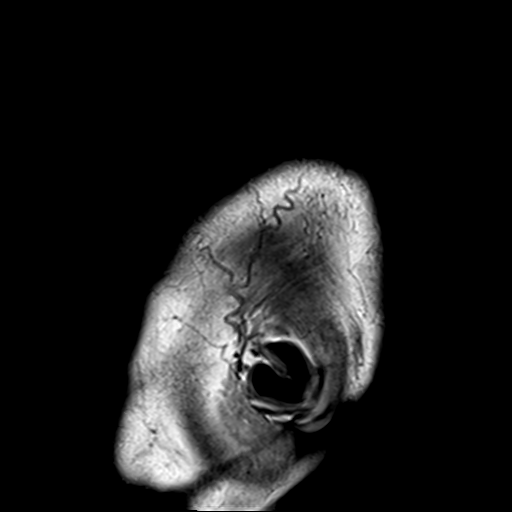
[im 8/23]
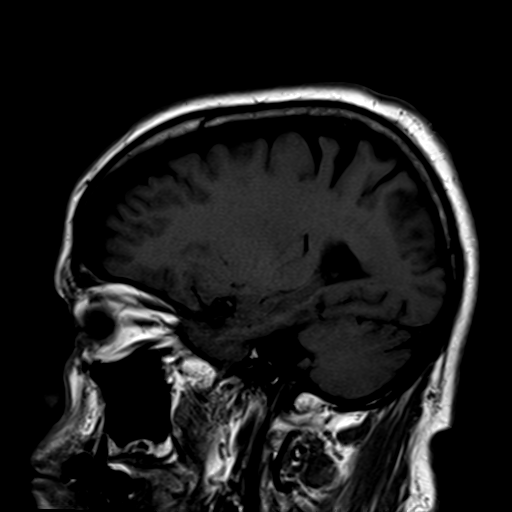
[im 15/23]
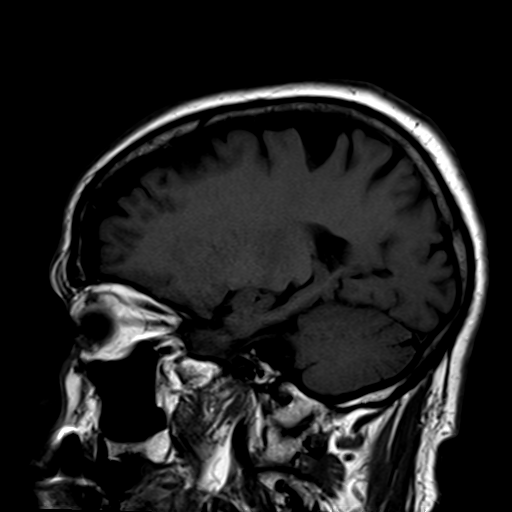
[im 23/23]
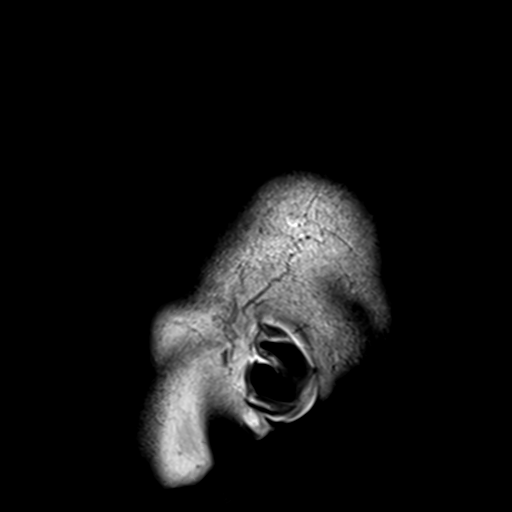

[Series 3: DWI · axial · 3.0mm · 1.80mm/px · z∈[-63,+83]mm · 11 of 100 slices shown (1 of 2)]
[im 1/100]
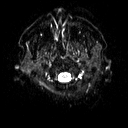
[im 10/100]
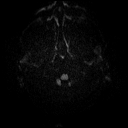
[im 20/100]
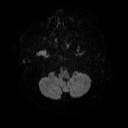
[im 30/100]
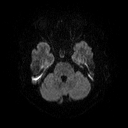
[im 40/100]
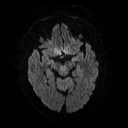
[im 50/100]
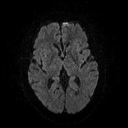
[im 60/100]
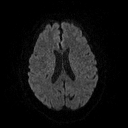
[im 70/100]
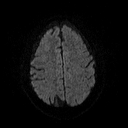
[im 80/100]
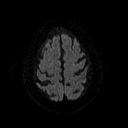
[im 90/100]
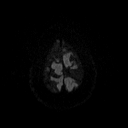
[im 100/100]
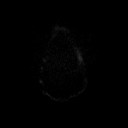

[Series 4: DWI · axial · 3.0mm · 1.80mm/px · z∈[-63,+83]mm · 5 of 44 slices shown (2 of 2)]
[im 1/44]
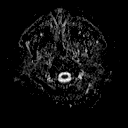
[im 11/44]
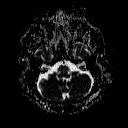
[im 22/44]
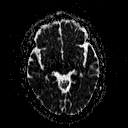
[im 33/44]
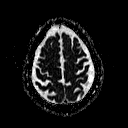
[im 44/44]
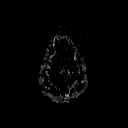

[Series 5: T2 · axial · 5.0mm · 0.45mm/px · z∈[-68,+75]mm · 3 of 23 slices shown]
[im 1/23]
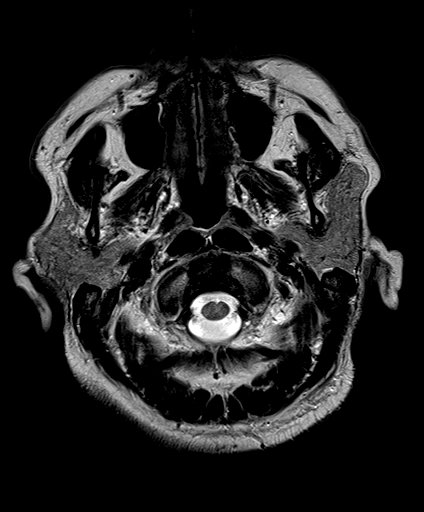
[im 12/23]
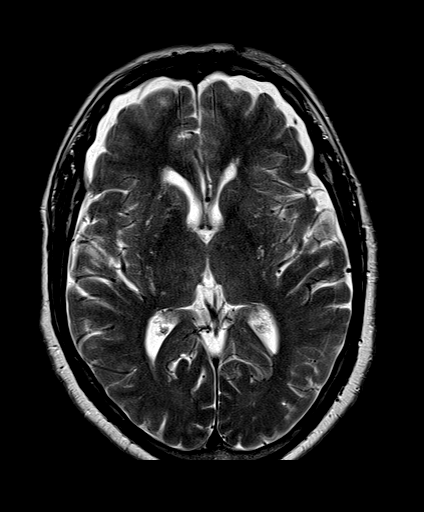
[im 23/23]
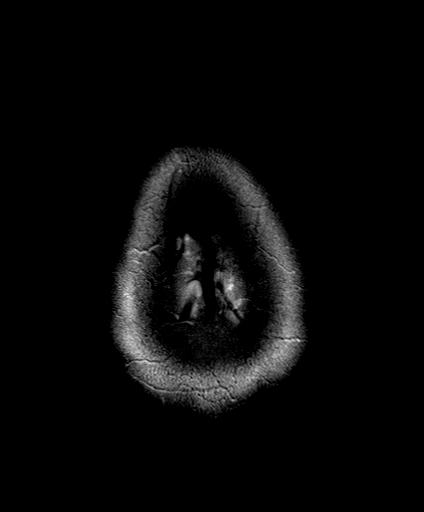

[Series 6: FLAIR · axial · 3.0mm · 0.45mm/px · z∈[-75,+81]mm · 3 of 27 slices shown]
[im 1/27]
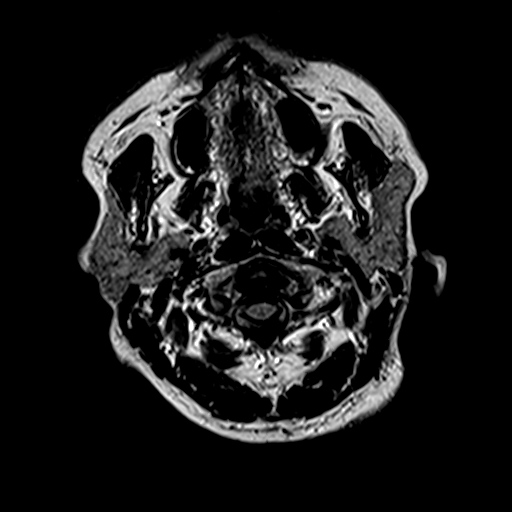
[im 14/27]
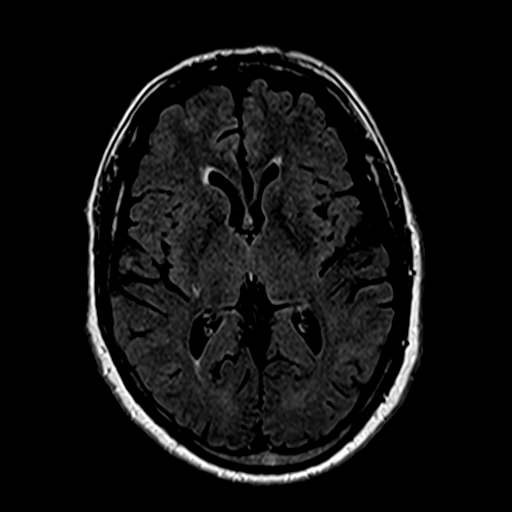
[im 27/27]
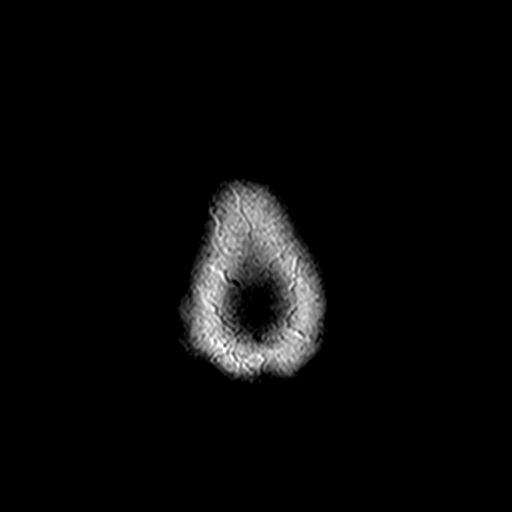

[Series 8: swi_images · axial · 3.0mm · 0.90mm/px · z∈[-73,+80]mm · 6 of 52 slices shown]
[im 1/52]
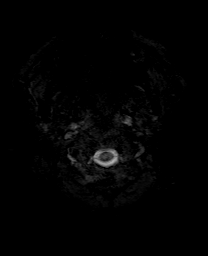
[im 11/52]
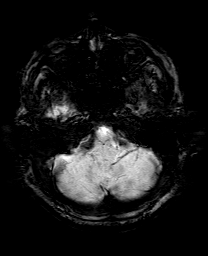
[im 21/52]
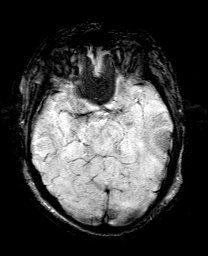
[im 31/52]
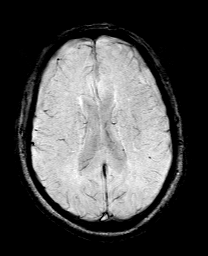
[im 41/52]
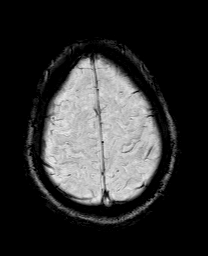
[im 52/52]
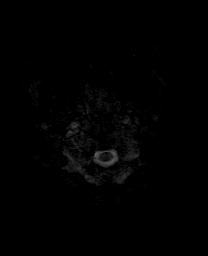

[Series 9: T1 · coronal · 3.0mm · 0.35mm/px · 1 of 11 slices shown (2 of 3)]
[im 1/11]
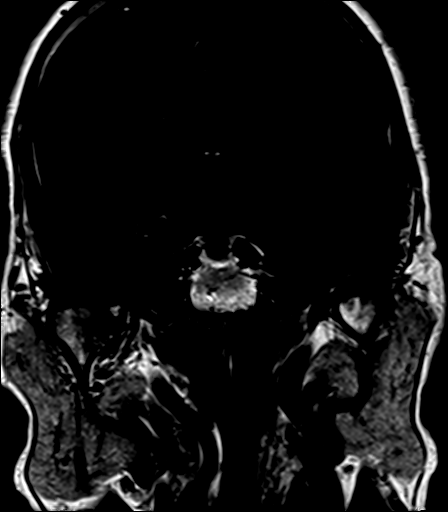

[Series 10: T1 · axial · 3.0mm · 0.35mm/px · 1 of 11 slices shown (3 of 3)]
[im 1/11]
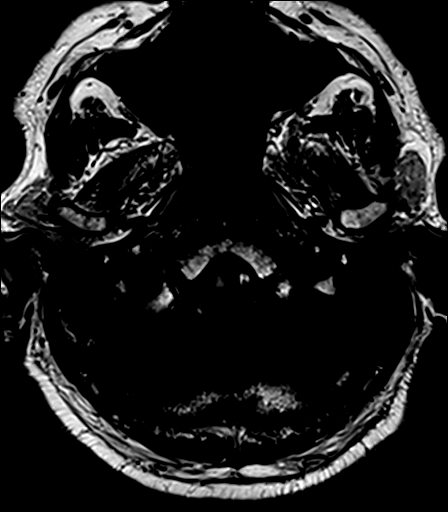

[Series 11: bSSFP · axial · 1.0mm · 0.28mm/px · z∈[-61,-50]mm · 2 of 36 slices shown]
[im 1/36]
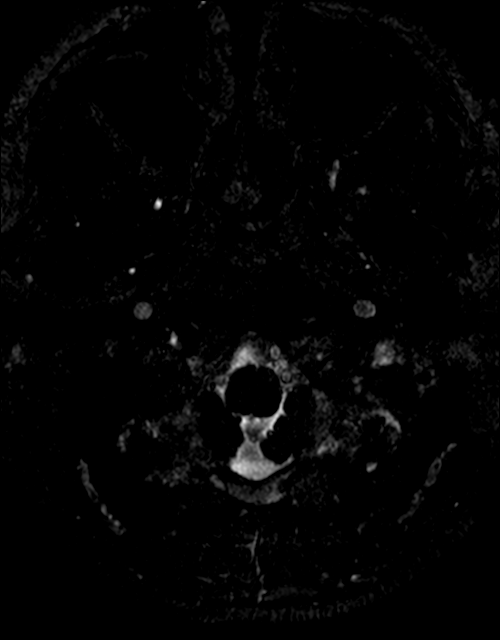
[im 12/36]
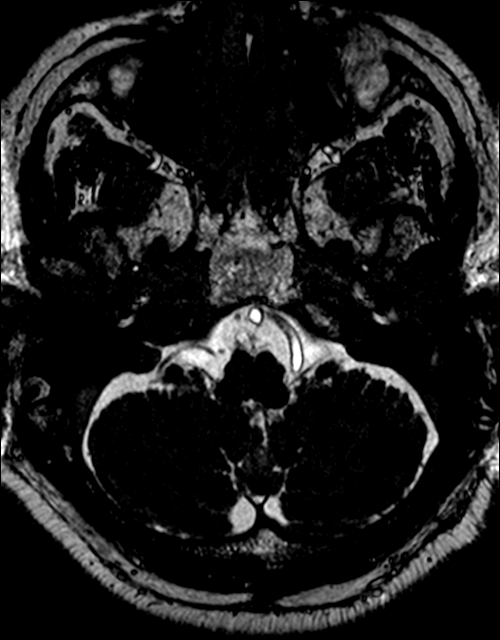

[Series 12: T1 post-contrast · coronal · 3.0mm · 0.35mm/px · 1 of 11 slices shown (1 of 2)]
[im 1/11]
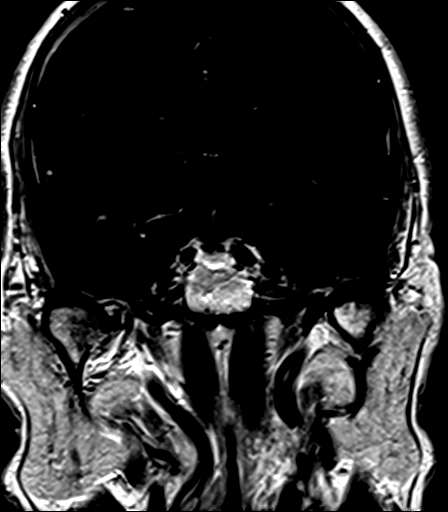

[Series 13: T1 post-contrast · axial · 3.0mm · 0.35mm/px · 1 of 11 slices shown (2 of 2)]
[im 1/11]
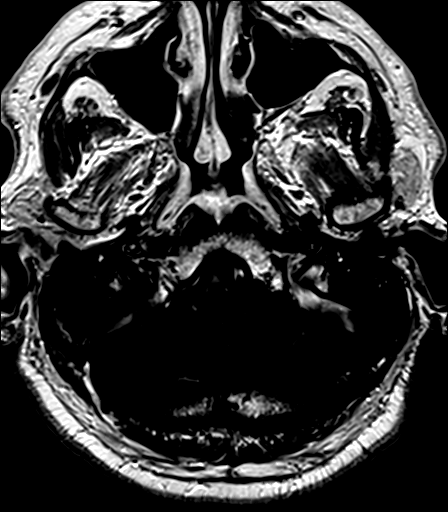

[38 of 48 positions shown; findings below may reference images not displayed]

FINDINGS: Brain: There is no evidence of acute intracranial hemorrhage,
extra-axial fluid collection, or acute infarct.

Parenchymal volume is normal. The ventricles are normal in size.
Scattered foci of FLAIR signal abnormality in the subcortical and
periventricular white matter likely reflects sequela of mild chronic
white matter microangiopathy.

There is no suspicious parenchymal signal abnormality. There is no
mass lesion or abnormal enhancement. There is no midline shift.

Internal auditory canals: There is no cerebellopontine angle mass.
The cochleae and semicircular canals are normal. No focal
abnormality along the course of the 7th and 8th cranial nerves.
Normal porus acusticus and vestibular aqueduct bilaterally.

Vascular: Normal flow voids. The major dural venous sinuses are
patent.

Skull and upper cervical spine: Normal marrow signal.

Sinuses/Orbits: The paranasal sinuses are clear. Bilateral lens
implants are in place. The globes and orbits are otherwise
unremarkable.

Other: None.
IMPRESSION: 1. Normal appearance of the IAC's.
2. No acute intracranial pathology. Mild chronic white matter
microangiopathy.

## 2022-06-26 DIAGNOSIS — H40051 Ocular hypertension, right eye: Secondary | ICD-10-CM | POA: Diagnosis not present

## 2022-06-26 DIAGNOSIS — H04123 Dry eye syndrome of bilateral lacrimal glands: Secondary | ICD-10-CM | POA: Diagnosis not present

## 2022-06-26 DIAGNOSIS — H40013 Open angle with borderline findings, low risk, bilateral: Secondary | ICD-10-CM | POA: Diagnosis not present

## 2022-06-26 DIAGNOSIS — H524 Presbyopia: Secondary | ICD-10-CM | POA: Diagnosis not present

## 2022-06-26 DIAGNOSIS — E119 Type 2 diabetes mellitus without complications: Secondary | ICD-10-CM | POA: Diagnosis not present

## 2022-07-15 DIAGNOSIS — H40012 Open angle with borderline findings, low risk, left eye: Secondary | ICD-10-CM | POA: Diagnosis not present

## 2022-08-17 DIAGNOSIS — H40013 Open angle with borderline findings, low risk, bilateral: Secondary | ICD-10-CM | POA: Diagnosis not present

## 2022-08-31 DIAGNOSIS — D485 Neoplasm of uncertain behavior of skin: Secondary | ICD-10-CM | POA: Diagnosis not present

## 2022-08-31 DIAGNOSIS — L570 Actinic keratosis: Secondary | ICD-10-CM | POA: Diagnosis not present

## 2022-09-14 DIAGNOSIS — L280 Lichen simplex chronicus: Secondary | ICD-10-CM | POA: Diagnosis not present

## 2022-11-04 DIAGNOSIS — H40011 Open angle with borderline findings, low risk, right eye: Secondary | ICD-10-CM | POA: Diagnosis not present

## 2022-11-04 DIAGNOSIS — H40051 Ocular hypertension, right eye: Secondary | ICD-10-CM | POA: Diagnosis not present

## 2022-11-11 DIAGNOSIS — E119 Type 2 diabetes mellitus without complications: Secondary | ICD-10-CM | POA: Diagnosis not present

## 2022-11-11 DIAGNOSIS — E1159 Type 2 diabetes mellitus with other circulatory complications: Secondary | ICD-10-CM | POA: Diagnosis not present

## 2022-11-11 DIAGNOSIS — I10 Essential (primary) hypertension: Secondary | ICD-10-CM | POA: Diagnosis not present

## 2022-11-11 DIAGNOSIS — H401131 Primary open-angle glaucoma, bilateral, mild stage: Secondary | ICD-10-CM | POA: Diagnosis not present

## 2022-11-11 DIAGNOSIS — Z0000 Encounter for general adult medical examination without abnormal findings: Secondary | ICD-10-CM | POA: Diagnosis not present

## 2022-12-15 DIAGNOSIS — H40013 Open angle with borderline findings, low risk, bilateral: Secondary | ICD-10-CM | POA: Diagnosis not present

## 2022-12-15 DIAGNOSIS — H40051 Ocular hypertension, right eye: Secondary | ICD-10-CM | POA: Diagnosis not present

## 2022-12-30 DIAGNOSIS — H40051 Ocular hypertension, right eye: Secondary | ICD-10-CM | POA: Diagnosis not present

## 2022-12-30 DIAGNOSIS — H40013 Open angle with borderline findings, low risk, bilateral: Secondary | ICD-10-CM | POA: Diagnosis not present

## 2023-01-09 DIAGNOSIS — Z23 Encounter for immunization: Secondary | ICD-10-CM | POA: Diagnosis not present

## 2023-07-28 DIAGNOSIS — H35372 Puckering of macula, left eye: Secondary | ICD-10-CM | POA: Diagnosis not present

## 2023-07-28 DIAGNOSIS — H40051 Ocular hypertension, right eye: Secondary | ICD-10-CM | POA: Diagnosis not present

## 2023-07-28 DIAGNOSIS — H524 Presbyopia: Secondary | ICD-10-CM | POA: Diagnosis not present

## 2023-07-28 DIAGNOSIS — H40013 Open angle with borderline findings, low risk, bilateral: Secondary | ICD-10-CM | POA: Diagnosis not present

## 2023-07-28 DIAGNOSIS — E119 Type 2 diabetes mellitus without complications: Secondary | ICD-10-CM | POA: Diagnosis not present

## 2023-09-25 ENCOUNTER — Ambulatory Visit (HOSPITAL_COMMUNITY)
Admission: EM | Admit: 2023-09-25 | Discharge: 2023-09-25 | Disposition: A | Discharge status: Home / Self Care | Attending: Physician Assistant | Admitting: Physician Assistant

## 2023-09-25 ENCOUNTER — Ambulatory Visit (INDEPENDENT_AMBULATORY_CARE_PROVIDER_SITE_OTHER)

## 2023-09-25 ENCOUNTER — Encounter (HOSPITAL_COMMUNITY): Payer: Self-pay

## 2023-09-25 DIAGNOSIS — Z23 Encounter for immunization: Secondary | ICD-10-CM | POA: Diagnosis not present

## 2023-09-25 DIAGNOSIS — S61012A Laceration without foreign body of left thumb without damage to nail, initial encounter: Secondary | ICD-10-CM

## 2023-09-25 DIAGNOSIS — M19042 Primary osteoarthritis, left hand: Secondary | ICD-10-CM | POA: Diagnosis not present

## 2023-09-25 MED ORDER — TETANUS-DIPHTH-ACELL PERTUSSIS 5-2.5-18.5 LF-MCG/0.5 IM SUSY
PREFILLED_SYRINGE | INTRAMUSCULAR | Status: AC
  Filled 2023-09-25: qty 0.5

## 2023-09-25 MED ORDER — CEPHALEXIN 500 MG PO CAPS
500.0000 mg | ORAL_CAPSULE | Freq: Four times a day (QID) | ORAL | 0 refills | Status: DC
Start: 2023-09-25 — End: 2023-12-06

## 2023-09-25 MED ORDER — BACITRACIN ZINC 500 UNIT/GM EX OINT
TOPICAL_OINTMENT | CUTANEOUS | Status: AC
  Filled 2023-09-25: qty 0.9

## 2023-09-25 MED ORDER — TETANUS-DIPHTH-ACELL PERTUSSIS 5-2.5-18.5 LF-MCG/0.5 IM SUSY
0.5000 mL | PREFILLED_SYRINGE | Freq: Once | INTRAMUSCULAR | Status: AC
  Administered 2023-09-25: 0.5 mL via INTRAMUSCULAR

## 2023-09-25 NOTE — ED Provider Notes (Addendum)
 [facility]    CSN: [id number] Arrival date & time: 09/25/23  1319      History   Chief Complaint Chief Complaint  Patient presents with   Laceration    HPI Nicholas Burke is a 78 y.o. male.   Patient presents today with an hour-long history of laceration to the base of his left thumb.  Reports that he was changing a flat tire when he overinflated the tire and this caused a metal part to fling off and cut his hand.  He is right-handed.  He is unsure when his last tetanus shot was.  He denies any numbness or paresthesias.  He immediately cleaned the area and then presented to our clinic so has not applied any topical medication.  He reports pain is rated 6 on a 0-10 pain scale, described as aching, worse with palpation, no relieving factors identified.    Past Medical History:  Diagnosis Date   Hx of radiation therapy 01/02/13- 02/24/13   prostate 7800 cGy/40 sessions, seminal vesicles 5600 cGy/40 sessions   Hypertension    Pre-diabetes    Prostate cancer (HCC) 08/30/2012   gleason 6, vol 65.7 cc    Patient Active Problem List   Diagnosis Date Noted   Special screening for malignant neoplasms, colon 10/18/2014   Hx of radiation therapy    Prostate cancer (HCC) 11/03/2012    Past Surgical History:  Procedure Laterality Date   CATARACT EXTRACTION, BILATERAL Bilateral    COLONOSCOPY WITH PROPOFOL  N/A 10/18/2014   Procedure: COLONOSCOPY WITH PROPOFOL ;  Surgeon: Baldo Bonds, MD;  Location: [facility];  Service: Endoscopy;  Laterality: N/A;   CRYOABLATION N/A 12/24/2020   Procedure: CRYO ABLATION PROSTATE;  Surgeon: Christina Coyer, MD;  Location: [facility];  Service: Urology;  Laterality: N/A;   EYE SURGERY Bilateral    cataract surgery   HOT HEMOSTASIS N/A 10/18/2014   Procedure: HOT HEMOSTASIS (ARGON PLASMA COAGULATION/BICAP);  Surgeon: Baldo Bonds, MD;  Location: [facility];  Service: Endoscopy;  Laterality: N/A;   PROSTATE BIOPSY  08/2012        Home Medications    Prior to Admission medications   Medication Sig Start Date End Date Taking? Authorizing Provider  cephALEXin (KEFLEX) 500 MG capsule Take 1 capsule (500 mg total) by mouth 4 (four) times daily. 09/25/23  Yes Dan Dissinger K, PA-C  diltiazem (CARDIZEM CD) 240 MG 24 hr capsule Take 240 mg by mouth daily. 08/29/20   [provider]  rosuvastatin (CRESTOR) 5 MG tablet Take 5 mg by mouth 3 (three) times a week. 11/21/20   [provider]    Family History Family History  Problem Relation Age of Onset   Prostate cancer Father    Prostate cancer Brother     Social History Social History   Tobacco Use   Smoking status: Never   Smokeless tobacco: Never  Vaping Use   Vaping status: Never Used  Substance Use Topics   Alcohol use: No   Drug use: No     Allergies   Patient has no known allergies.   Review of Systems Review of Systems  Constitutional:  Positive for activity change. Negative for appetite change, fatigue and fever.  Musculoskeletal:  Negative for arthralgias and myalgias.  Skin:  Positive for wound.  Neurological:  Negative for weakness and numbness.     Physical Exam Triage Vital Signs ED Triage Vitals [09/25/23 1348]  Encounter Vitals Group     BP (!) 148/87  Systolic BP Percentile      Diastolic BP Percentile      Pulse Rate 80     Resp 16     Temp 98.1 F (36.7 C)     Temp Source Oral     SpO2 95 %     Weight      Height      Head Circumference      Peak Flow      Pain Score 6     Pain Loc      Pain Education      Exclude from Growth Chart    No data found.  Updated Vital Signs BP (!) 148/87 (BP Location: Right Arm)   Pulse 80   Temp 98.1 F (36.7 C) (Oral)   Resp 16   SpO2 95%   Visual Acuity Right Eye Distance:   Left Eye Distance:   Bilateral Distance:    Right Eye Near:   Left Eye Near:    Bilateral Near:     Physical Exam Vitals reviewed.  Constitutional:      General: He  is awake.     Appearance: Normal appearance. He is well-developed. He is not ill-appearing.     Comments: Very pleasant male appears stated age in no acute distress sitting comfortably in exam room  HENT:     Head: Normocephalic and atraumatic.     Mouth/Throat:     Pharynx: No oropharyngeal exudate, posterior oropharyngeal erythema or uvula swelling.  Cardiovascular:     Comments: Capillary refill within 2 seconds left fingers including left thumb Pulmonary:     Effort: Pulmonary effort is normal. No tachypnea, accessory muscle usage or prolonged expiration.  Abdominal:     General: Bowel sounds are normal.     Palpations: Abdomen is soft.     Tenderness: There is no abdominal tenderness.  Musculoskeletal:     Left hand: Laceration and tenderness present. No swelling. Normal range of motion. Normal sensation. There is no disruption of two-point discrimination. Normal capillary refill.     Comments: Left thumb: Normal active range of motion at left thumb with appropriate strength.  Fingers neurovascularly intact.  Approximately 4 cm laceration noted distal left thumb over MCP joint into palm.  Neurological:     Mental Status: He is alert.  Psychiatric:        Behavior: Behavior is cooperative.      UC Treatments / Results  Labs (all labs ordered are listed, but only abnormal results are displayed) Labs Reviewed - No data to display  EKG   Radiology DG Finger Thumb Left Result Date: 09/25/2023 CLINICAL DATA:  laceration at base on left thumb EXAM: LEFT THUMB 2+V COMPARISON:  None Available. FINDINGS: No acute fracture or dislocation. Minimal degenerative changes of the first CMC. Mild degenerative changes of the radiocarpal joint. No area of erosion or osseous destruction. No unexpected radiopaque foreign body. Soft tissue air at the base of the thumb. IMPRESSION: 1. No acute fracture or dislocation. 2. Soft tissue air at the base of the thumb consistent with history of laceration.  Electronically Signed   By: Clancy Crimes M.D.   On: 09/25/2023 14:25    Procedures Laceration Repair  Date/Time: 09/25/2023 3:56 PM  Performed by: Budd Cargo, PA-C Authorized by: Budd Cargo, PA-C   Consent:    Consent obtained:  Verbal   Consent given by:  Patient   Risks discussed:  Infection, pain, poor cosmetic result, poor wound healing, retained  foreign body, tendon damage and need for additional repair   Alternatives discussed:  Referral and observation Universal protocol:    Procedure explained and questions answered to patient or proxy's satisfaction: yes     Patient identity confirmed:  Verbally with patient Anesthesia:    Anesthesia method:  Local infiltration   Local anesthetic:  Lidocaine  2% w/o epi Laceration details:    Location:  Finger   Finger location:  L thumb   Length (cm):  4 Pre-procedure details:    Preparation:  Patient was prepped and draped in usual sterile fashion and imaging obtained to evaluate for foreign bodies Exploration:    Limited defect created (wound extended): no     Hemostasis achieved with:  Direct pressure   Imaging obtained: x-ray     Imaging outcome: foreign body not noted     Contaminated: no   Treatment:    Area cleansed with:  Chlorhexidine  and saline   Amount of cleaning:  Standard   Irrigation solution:  Sterile saline   Irrigation volume:  200 mL   Irrigation method:  Syringe Skin repair:    Repair method:  Sutures   Suture size:  4-0   Suture material:  Prolene   Suture technique:  Simple interrupted   Number of sutures:  11 Approximation:    Approximation:  Close Repair type:    Repair type:  Intermediate Post-procedure details:    Dressing:  Non-adherent dressing and splint for protection   Procedure completion:  Tolerated well, no immediate complications  (including critical care time)  Medications Ordered in UC Medications  Tdap (BOOSTRIX) injection 0.5 mL (0.5 mLs Intramuscular Given 09/25/23  1418)    Initial Impression / Assessment and Plan / UC Course  I have reviewed the triage vital signs and the nursing notes.  Pertinent labs & imaging results that were available during my care of the patient were reviewed by me and considered in my medical decision making (see chart for details).     Patient is well-appearing, afebrile, nontoxic, nontachycardic.  Initially we did discuss potentially going to the emergency room given depth of wound, however, given finger was neurovascular tact with normal active range of motion with attempted to close this in clinic.  X-ray was obtained that showed no acute fracture or retained foreign body.  11 sutures were placed after area was soaked and rinsed with saline.  Because of concern for infection since he was injured with a piece of metal from a tire we will start cephalexin 4 times daily for 5 days.  He was placed in nonstick bandage and a brace for comfort and support and to avoid use of the thumb that could lead to dehiscence.  Tdap was updated.  I did recommend he follow-up with a hand specialist given the location of the laceration to ensure that this is healing appropriately and that we do not need to do anything further.  He was given the contact information with instruction to call to schedule an appointment.  We discussed that as long as this continues to heal appropriately he can return to our clinic in 10 to 14 days to have sutures removed.  We discussed that if there are any signs of infections or worsening symptoms including discoloration of the thumb, increasing pain, persistent numbness after the numbing medication has had time to wear off he needs to be seen immediately.  Strict return precautions given.  All questions answered patient satisfaction.  Final Clinical Impressions(s) / UC Diagnoses  Final diagnoses:  Laceration of left thumb without foreign body without damage to nail, initial encounter     Discharge Instructions       We updated your tetanus.  We placed 11 sutures.  Keep this area clean with soap and water .  Start the antibiotics as prescribed.  Follow-up with hand specialist.  If there are any signs of infection including redness, drainage, increasing pain, fever you need to be seen immediately.  If you cannot move your finger or develop any kind of numbness, tingling, discoloration you need to be seen immediately.  ED Prescriptions     Medication Sig Dispense Auth. Provider   cephALEXin (KEFLEX) 500 MG capsule Take 1 capsule (500 mg total) by mouth 4 (four) times daily. 20 capsule Malissia Rabbani K, PA-C      PDMP not reviewed this encounter.   Budd Cargo, PA-C 09/25/23 1602    Budd Cargo, PA-C 09/25/23 1604

## 2023-09-25 NOTE — ED Triage Notes (Signed)
 Patient states he was using a tie down on a flat tire and when trying to take the tie down off , the tie down flew off and the metal part cut his left hand at the base of his thumb.   Patient soaking hand in NS at this time.   Patient is unsure of his last tetanus/Tdap.

## 2023-09-25 NOTE — Discharge Instructions (Signed)
 We updated your tetanus.  We placed 11 sutures.  Keep this area clean with soap and water .  Start the antibiotics as prescribed.  Follow-up with hand specialist.  If there are any signs of infection including redness, drainage, increasing pain, fever you need to be seen immediately.  If you cannot move your finger or develop any kind of numbness, tingling, discoloration you need to be seen immediately.

## 2023-09-29 DIAGNOSIS — S61012A Laceration without foreign body of left thumb without damage to nail, initial encounter: Secondary | ICD-10-CM | POA: Diagnosis not present

## 2023-10-06 DIAGNOSIS — S61012D Laceration without foreign body of left thumb without damage to nail, subsequent encounter: Secondary | ICD-10-CM | POA: Diagnosis not present

## 2023-11-11 DIAGNOSIS — E118 Type 2 diabetes mellitus with unspecified complications: Secondary | ICD-10-CM | POA: Diagnosis not present

## 2023-11-11 DIAGNOSIS — I10 Essential (primary) hypertension: Secondary | ICD-10-CM | POA: Diagnosis not present

## 2023-11-11 DIAGNOSIS — Z0000 Encounter for general adult medical examination without abnormal findings: Secondary | ICD-10-CM | POA: Diagnosis not present

## 2023-11-11 DIAGNOSIS — E782 Mixed hyperlipidemia: Secondary | ICD-10-CM | POA: Diagnosis not present

## 2023-12-06 ENCOUNTER — Encounter (HOSPITAL_COMMUNITY): Payer: Self-pay | Admitting: *Deleted

## 2023-12-06 ENCOUNTER — Inpatient Hospital Stay (HOSPITAL_COMMUNITY)
Admission: EM | Admit: 2023-12-06 | Discharge: 2024-01-01 | DRG: 299 | Disposition: A | Discharge status: Home / Self Care | Attending: Internal Medicine | Admitting: Internal Medicine

## 2023-12-06 ENCOUNTER — Other Ambulatory Visit: Payer: Self-pay

## 2023-12-06 ENCOUNTER — Emergency Department (HOSPITAL_COMMUNITY)

## 2023-12-06 DIAGNOSIS — I7123 Aneurysm of the descending thoracic aorta, without rupture: Secondary | ICD-10-CM | POA: Diagnosis not present

## 2023-12-06 DIAGNOSIS — E876 Hypokalemia: Secondary | ICD-10-CM | POA: Diagnosis not present

## 2023-12-06 DIAGNOSIS — I7100 Dissection of unspecified site of aorta: Secondary | ICD-10-CM | POA: Diagnosis not present

## 2023-12-06 DIAGNOSIS — E1143 Type 2 diabetes mellitus with diabetic autonomic (poly)neuropathy: Secondary | ICD-10-CM | POA: Diagnosis not present

## 2023-12-06 DIAGNOSIS — K3189 Other diseases of stomach and duodenum: Secondary | ICD-10-CM | POA: Diagnosis not present

## 2023-12-06 DIAGNOSIS — H409 Unspecified glaucoma: Secondary | ICD-10-CM | POA: Diagnosis present

## 2023-12-06 DIAGNOSIS — R933 Abnormal findings on diagnostic imaging of other parts of digestive tract: Secondary | ICD-10-CM | POA: Diagnosis not present

## 2023-12-06 DIAGNOSIS — K30 Functional dyspepsia: Secondary | ICD-10-CM | POA: Diagnosis not present

## 2023-12-06 DIAGNOSIS — D649 Anemia, unspecified: Secondary | ICD-10-CM | POA: Diagnosis not present

## 2023-12-06 DIAGNOSIS — J9601 Acute respiratory failure with hypoxia: Secondary | ICD-10-CM | POA: Diagnosis not present

## 2023-12-06 DIAGNOSIS — K8070 Calculus of gallbladder and bile duct without cholecystitis without obstruction: Secondary | ICD-10-CM | POA: Diagnosis not present

## 2023-12-06 DIAGNOSIS — E440 Moderate protein-calorie malnutrition: Secondary | ICD-10-CM | POA: Diagnosis not present

## 2023-12-06 DIAGNOSIS — K5903 Drug induced constipation: Secondary | ICD-10-CM | POA: Diagnosis not present

## 2023-12-06 DIAGNOSIS — E870 Hyperosmolality and hypernatremia: Secondary | ICD-10-CM | POA: Diagnosis not present

## 2023-12-06 DIAGNOSIS — I722 Aneurysm of renal artery: Secondary | ICD-10-CM | POA: Diagnosis not present

## 2023-12-06 DIAGNOSIS — Z8546 Personal history of malignant neoplasm of prostate: Secondary | ICD-10-CM

## 2023-12-06 DIAGNOSIS — K4020 Bilateral inguinal hernia, without obstruction or gangrene, not specified as recurrent: Secondary | ICD-10-CM | POA: Diagnosis not present

## 2023-12-06 DIAGNOSIS — E874 Mixed disorder of acid-base balance: Secondary | ICD-10-CM | POA: Diagnosis not present

## 2023-12-06 DIAGNOSIS — I509 Heart failure, unspecified: Secondary | ICD-10-CM | POA: Diagnosis not present

## 2023-12-06 DIAGNOSIS — J984 Other disorders of lung: Secondary | ICD-10-CM | POA: Diagnosis not present

## 2023-12-06 DIAGNOSIS — J9811 Atelectasis: Secondary | ICD-10-CM | POA: Diagnosis not present

## 2023-12-06 DIAGNOSIS — T85598A Other mechanical complication of other gastrointestinal prosthetic devices, implants and grafts, initial encounter: Secondary | ICD-10-CM | POA: Diagnosis not present

## 2023-12-06 DIAGNOSIS — E785 Hyperlipidemia, unspecified: Secondary | ICD-10-CM

## 2023-12-06 DIAGNOSIS — R0902 Hypoxemia: Secondary | ICD-10-CM | POA: Diagnosis not present

## 2023-12-06 DIAGNOSIS — E782 Mixed hyperlipidemia: Secondary | ICD-10-CM | POA: Diagnosis not present

## 2023-12-06 DIAGNOSIS — Z923 Personal history of irradiation: Secondary | ICD-10-CM

## 2023-12-06 DIAGNOSIS — Z79899 Other long term (current) drug therapy: Secondary | ICD-10-CM | POA: Diagnosis not present

## 2023-12-06 DIAGNOSIS — D62 Acute posthemorrhagic anemia: Secondary | ICD-10-CM | POA: Diagnosis not present

## 2023-12-06 DIAGNOSIS — E8809 Other disorders of plasma-protein metabolism, not elsewhere classified: Secondary | ICD-10-CM | POA: Diagnosis present

## 2023-12-06 DIAGNOSIS — T502X5A Adverse effect of carbonic-anhydrase inhibitors, benzothiadiazides and other diuretics, initial encounter: Secondary | ICD-10-CM | POA: Diagnosis not present

## 2023-12-06 DIAGNOSIS — G9340 Encephalopathy, unspecified: Secondary | ICD-10-CM | POA: Diagnosis not present

## 2023-12-06 DIAGNOSIS — Z6832 Body mass index (BMI) 32.0-32.9, adult: Secondary | ICD-10-CM | POA: Diagnosis not present

## 2023-12-06 DIAGNOSIS — G9341 Metabolic encephalopathy: Secondary | ICD-10-CM | POA: Diagnosis not present

## 2023-12-06 DIAGNOSIS — T508X5A Adverse effect of diagnostic agents, initial encounter: Secondary | ICD-10-CM | POA: Diagnosis not present

## 2023-12-06 DIAGNOSIS — E1165 Type 2 diabetes mellitus with hyperglycemia: Secondary | ICD-10-CM | POA: Diagnosis not present

## 2023-12-06 DIAGNOSIS — K254 Chronic or unspecified gastric ulcer with hemorrhage: Secondary | ICD-10-CM | POA: Diagnosis not present

## 2023-12-06 DIAGNOSIS — R1013 Epigastric pain: Secondary | ICD-10-CM | POA: Diagnosis not present

## 2023-12-06 DIAGNOSIS — J690 Pneumonitis due to inhalation of food and vomit: Secondary | ICD-10-CM | POA: Diagnosis not present

## 2023-12-06 DIAGNOSIS — K921 Melena: Secondary | ICD-10-CM | POA: Diagnosis not present

## 2023-12-06 DIAGNOSIS — C61 Malignant neoplasm of prostate: Secondary | ICD-10-CM | POA: Diagnosis not present

## 2023-12-06 DIAGNOSIS — Z452 Encounter for adjustment and management of vascular access device: Secondary | ICD-10-CM | POA: Diagnosis not present

## 2023-12-06 DIAGNOSIS — N179 Acute kidney failure, unspecified: Secondary | ICD-10-CM | POA: Diagnosis not present

## 2023-12-06 DIAGNOSIS — I77819 Aortic ectasia, unspecified site: Secondary | ICD-10-CM | POA: Diagnosis not present

## 2023-12-06 DIAGNOSIS — K259 Gastric ulcer, unspecified as acute or chronic, without hemorrhage or perforation: Secondary | ICD-10-CM | POA: Diagnosis not present

## 2023-12-06 DIAGNOSIS — I719 Aortic aneurysm of unspecified site, without rupture: Secondary | ICD-10-CM | POA: Diagnosis not present

## 2023-12-06 DIAGNOSIS — E8729 Other acidosis: Secondary | ICD-10-CM | POA: Diagnosis not present

## 2023-12-06 DIAGNOSIS — K3184 Gastroparesis: Secondary | ICD-10-CM | POA: Diagnosis not present

## 2023-12-06 DIAGNOSIS — I10 Essential (primary) hypertension: Secondary | ICD-10-CM | POA: Diagnosis present

## 2023-12-06 DIAGNOSIS — R000 Tachycardia, unspecified: Secondary | ICD-10-CM | POA: Diagnosis not present

## 2023-12-06 DIAGNOSIS — E119 Type 2 diabetes mellitus without complications: Secondary | ICD-10-CM

## 2023-12-06 DIAGNOSIS — J90 Pleural effusion, not elsewhere classified: Secondary | ICD-10-CM | POA: Diagnosis not present

## 2023-12-06 DIAGNOSIS — K9189 Other postprocedural complications and disorders of digestive system: Secondary | ICD-10-CM | POA: Diagnosis not present

## 2023-12-06 DIAGNOSIS — I959 Hypotension, unspecified: Secondary | ICD-10-CM | POA: Diagnosis not present

## 2023-12-06 DIAGNOSIS — Z7401 Bed confinement status: Secondary | ICD-10-CM | POA: Diagnosis not present

## 2023-12-06 DIAGNOSIS — R918 Other nonspecific abnormal finding of lung field: Secondary | ICD-10-CM | POA: Diagnosis not present

## 2023-12-06 DIAGNOSIS — F05 Delirium due to known physiological condition: Secondary | ICD-10-CM | POA: Diagnosis not present

## 2023-12-06 DIAGNOSIS — I161 Hypertensive emergency: Secondary | ICD-10-CM | POA: Diagnosis not present

## 2023-12-06 DIAGNOSIS — I7103 Dissection of thoracoabdominal aorta: Secondary | ICD-10-CM | POA: Diagnosis not present

## 2023-12-06 DIAGNOSIS — E118 Type 2 diabetes mellitus with unspecified complications: Secondary | ICD-10-CM | POA: Diagnosis present

## 2023-12-06 DIAGNOSIS — K920 Hematemesis: Secondary | ICD-10-CM | POA: Diagnosis not present

## 2023-12-06 DIAGNOSIS — T40605A Adverse effect of unspecified narcotics, initial encounter: Secondary | ICD-10-CM | POA: Diagnosis not present

## 2023-12-06 DIAGNOSIS — K4090 Unilateral inguinal hernia, without obstruction or gangrene, not specified as recurrent: Secondary | ICD-10-CM | POA: Diagnosis not present

## 2023-12-06 DIAGNOSIS — K567 Ileus, unspecified: Secondary | ICD-10-CM | POA: Diagnosis not present

## 2023-12-06 DIAGNOSIS — E860 Dehydration: Secondary | ICD-10-CM | POA: Diagnosis not present

## 2023-12-06 DIAGNOSIS — R0600 Dyspnea, unspecified: Secondary | ICD-10-CM | POA: Diagnosis not present

## 2023-12-06 DIAGNOSIS — I2510 Atherosclerotic heart disease of native coronary artery without angina pectoris: Secondary | ICD-10-CM | POA: Diagnosis not present

## 2023-12-06 DIAGNOSIS — K922 Gastrointestinal hemorrhage, unspecified: Secondary | ICD-10-CM | POA: Diagnosis not present

## 2023-12-06 DIAGNOSIS — R079 Chest pain, unspecified: Secondary | ICD-10-CM | POA: Diagnosis not present

## 2023-12-06 DIAGNOSIS — K2960 Other gastritis without bleeding: Secondary | ICD-10-CM | POA: Diagnosis not present

## 2023-12-06 DIAGNOSIS — Z4682 Encounter for fitting and adjustment of non-vascular catheter: Secondary | ICD-10-CM | POA: Diagnosis not present

## 2023-12-06 DIAGNOSIS — E66811 Obesity, class 1: Secondary | ICD-10-CM | POA: Diagnosis present

## 2023-12-06 DIAGNOSIS — D500 Iron deficiency anemia secondary to blood loss (chronic): Secondary | ICD-10-CM | POA: Diagnosis not present

## 2023-12-06 DIAGNOSIS — N400 Enlarged prostate without lower urinary tract symptoms: Secondary | ICD-10-CM | POA: Diagnosis present

## 2023-12-06 DIAGNOSIS — N1411 Contrast-induced nephropathy: Secondary | ICD-10-CM | POA: Diagnosis not present

## 2023-12-06 DIAGNOSIS — E8770 Fluid overload, unspecified: Secondary | ICD-10-CM | POA: Diagnosis not present

## 2023-12-06 DIAGNOSIS — I71012 Dissection of descending thoracic aorta: Principal | ICD-10-CM | POA: Diagnosis present

## 2023-12-06 DIAGNOSIS — Z8042 Family history of malignant neoplasm of prostate: Secondary | ICD-10-CM

## 2023-12-06 DIAGNOSIS — K449 Diaphragmatic hernia without obstruction or gangrene: Secondary | ICD-10-CM | POA: Diagnosis present

## 2023-12-06 DIAGNOSIS — K8020 Calculus of gallbladder without cholecystitis without obstruction: Secondary | ICD-10-CM | POA: Diagnosis not present

## 2023-12-06 DIAGNOSIS — R109 Unspecified abdominal pain: Secondary | ICD-10-CM | POA: Diagnosis not present

## 2023-12-06 DIAGNOSIS — R0989 Other specified symptoms and signs involving the circulatory and respiratory systems: Secondary | ICD-10-CM | POA: Diagnosis not present

## 2023-12-06 DIAGNOSIS — K560 Paralytic ileus: Secondary | ICD-10-CM | POA: Diagnosis not present

## 2023-12-06 DIAGNOSIS — R1084 Generalized abdominal pain: Secondary | ICD-10-CM | POA: Diagnosis not present

## 2023-12-06 DIAGNOSIS — R140 Abdominal distension (gaseous): Secondary | ICD-10-CM | POA: Diagnosis not present

## 2023-12-06 DIAGNOSIS — I169 Hypertensive crisis, unspecified: Secondary | ICD-10-CM | POA: Diagnosis not present

## 2023-12-06 DIAGNOSIS — R110 Nausea: Secondary | ICD-10-CM | POA: Diagnosis not present

## 2023-12-06 LAB — TROPONIN I (HIGH SENSITIVITY)
Troponin I (High Sensitivity): 10 ng/L (ref <18)
Troponin I (High Sensitivity): 8 ng/L (ref <18)

## 2023-12-06 LAB — LIPASE, BLOOD: Lipase: 37 U/L (ref 11–51)

## 2023-12-06 LAB — LACTIC ACID, PLASMA: Lactic Acid, Venous: 1.9 mmol/L (ref 0.5–1.9)

## 2023-12-06 LAB — CBC
HCT: 46.9 % (ref 39.0–52.0)
Hemoglobin: 15 g/dL (ref 13.0–17.0)
MCH: 27 pg (ref 26.0–34.0)
MCHC: 32 g/dL (ref 30.0–36.0)
MCV: 84.5 fL (ref 80.0–100.0)
Platelets: 216 K/uL (ref 150–400)
RBC: 5.55 MIL/uL (ref 4.22–5.81)
RDW: 13.9 % (ref 11.5–15.5)
WBC: 7.3 K/uL (ref 4.0–10.5)
nRBC: 0 % (ref 0.0–0.2)

## 2023-12-06 LAB — BASIC METABOLIC PANEL WITH GFR
Anion gap: 13 (ref 5–15)
BUN: 20 mg/dL (ref 8–23)
CO2: 25 mmol/L (ref 22–32)
Calcium: 9.2 mg/dL (ref 8.9–10.3)
Chloride: 105 mmol/L (ref 98–111)
Creatinine, Ser: 1.16 mg/dL (ref 0.61–1.24)
GFR, Estimated: >60 mL/min (ref >60)
Glucose, Bld: 147 mg/dL — ABNORMAL HIGH (ref 70–99)
Potassium: 2.9 mmol/L — ABNORMAL LOW (ref 3.5–5.1)
Sodium: 143 mmol/L (ref 135–145)

## 2023-12-06 LAB — PHOSPHORUS: Phosphorus: 2.9 mg/dL (ref 2.5–4.6)

## 2023-12-06 LAB — CBG MONITORING, ED: Glucose-Capillary: 181 mg/dL — ABNORMAL HIGH (ref 70–99)

## 2023-12-06 LAB — GLUCOSE, CAPILLARY: Glucose-Capillary: 129 mg/dL — ABNORMAL HIGH (ref 70–99)

## 2023-12-06 LAB — HEPATIC FUNCTION PANEL
ALT: 14 U/L (ref 0–44)
AST: 24 U/L (ref 15–41)
Albumin: 3.8 g/dL (ref 3.5–5.0)
Alkaline Phosphatase: 38 U/L (ref 38–126)
Bilirubin, Direct: 0.1 mg/dL (ref 0.0–0.2)
Indirect Bilirubin: 0.8 mg/dL (ref 0.3–0.9)
Total Bilirubin: 0.9 mg/dL (ref 0.0–1.2)
Total Protein: 7.3 g/dL (ref 6.5–8.1)

## 2023-12-06 LAB — MRSA NEXT GEN BY PCR, NASAL: MRSA by PCR Next Gen: NOT DETECTED

## 2023-12-06 LAB — MAGNESIUM: Magnesium: 1.9 mg/dL (ref 1.7–2.4)

## 2023-12-06 MED ORDER — ESMOLOL HCL-SODIUM CHLORIDE 2000 MG/100ML IV SOLN
25.0000 ug/kg/min | INTRAVENOUS | Status: DC
  Administered 2023-12-06: 175 ug/kg/min via INTRAVENOUS
  Administered 2023-12-06 (×2): 25 ug/kg/min via INTRAVENOUS
  Administered 2023-12-06: 175 ug/kg/min via INTRAVENOUS
  Administered 2023-12-06 (×2): 250 ug/kg/min via INTRAVENOUS
  Administered 2023-12-07 (×4): 275 ug/kg/min via INTRAVENOUS
  Administered 2023-12-07: 200 ug/kg/min via INTRAVENOUS
  Administered 2023-12-07: 175 ug/kg/min via INTRAVENOUS
  Administered 2023-12-07: 225 ug/kg/min via INTRAVENOUS
  Administered 2023-12-07: 275 ug/kg/min via INTRAVENOUS
  Administered 2023-12-07: 225 ug/kg/min via INTRAVENOUS
  Administered 2023-12-07 (×2): 175 ug/kg/min via INTRAVENOUS
  Administered 2023-12-07: 275 ug/kg/min via INTRAVENOUS
  Administered 2023-12-07: 150 ug/kg/min via INTRAVENOUS
  Administered 2023-12-07: 250 ug/kg/min via INTRAVENOUS
  Administered 2023-12-07: 275 ug/kg/min via INTRAVENOUS
  Administered 2023-12-07: 175 ug/kg/min via INTRAVENOUS
  Administered 2023-12-07: 200 ug/kg/min via INTRAVENOUS
  Administered 2023-12-07: 275 ug/kg/min via INTRAVENOUS
  Administered 2023-12-07: 250 ug/kg/min via INTRAVENOUS
  Administered 2023-12-07 (×3): 275 ug/kg/min via INTRAVENOUS
  Administered 2023-12-07: 225 ug/kg/min via INTRAVENOUS
  Administered 2023-12-07 (×2): 275 ug/kg/min via INTRAVENOUS
  Administered 2023-12-07: 150 ug/kg/min via INTRAVENOUS
  Administered 2023-12-07: 200 ug/kg/min via INTRAVENOUS
  Administered 2023-12-07: 275 ug/kg/min via INTRAVENOUS
  Administered 2023-12-07 (×3): 225 ug/kg/min via INTRAVENOUS
  Administered 2023-12-07: 200 ug/kg/min via INTRAVENOUS
  Administered 2023-12-08 (×2): 300 ug/kg/min via INTRAVENOUS
  Administered 2023-12-08: 275 ug/kg/min via INTRAVENOUS
  Administered 2023-12-08: 300 ug/kg/min via INTRAVENOUS
  Administered 2023-12-08: 225 ug/kg/min via INTRAVENOUS
  Administered 2023-12-08 (×3): 275 ug/kg/min via INTRAVENOUS
  Administered 2023-12-08 (×2): 225 ug/kg/min via INTRAVENOUS
  Administered 2023-12-08: 300 ug/kg/min via INTRAVENOUS
  Administered 2023-12-08: 275 ug/kg/min via INTRAVENOUS
  Administered 2023-12-08: 225 ug/kg/min via INTRAVENOUS
  Administered 2023-12-08: 300 ug/kg/min via INTRAVENOUS
  Administered 2023-12-08 (×2): 225 ug/kg/min via INTRAVENOUS
  Administered 2023-12-08 (×3): 300 ug/kg/min via INTRAVENOUS
  Administered 2023-12-08 (×2): 225 ug/kg/min via INTRAVENOUS
  Administered 2023-12-08: 275 ug/kg/min via INTRAVENOUS
  Administered 2023-12-08: 225 ug/kg/min via INTRAVENOUS
  Administered 2023-12-08 (×2): 275 ug/kg/min via INTRAVENOUS
  Administered 2023-12-08: 225 ug/kg/min via INTRAVENOUS
  Administered 2023-12-08 (×4): 275 ug/kg/min via INTRAVENOUS
  Administered 2023-12-09: 175 ug/kg/min via INTRAVENOUS
  Administered 2023-12-09: 125 ug/kg/min via INTRAVENOUS
  Administered 2023-12-09: 225 ug/kg/min via INTRAVENOUS
  Administered 2023-12-09 (×2): 175 ug/kg/min via INTRAVENOUS
  Administered 2023-12-09: 200 ug/kg/min via INTRAVENOUS
  Filled 2023-12-06 (×4): qty 100
  Filled 2023-12-06: qty 200
  Filled 2023-12-06 (×7): qty 100
  Filled 2023-12-06: qty 200
  Filled 2023-12-06 (×4): qty 100
  Filled 2023-12-06: qty 300
  Filled 2023-12-06 (×4): qty 100
  Filled 2023-12-06: qty 200
  Filled 2023-12-06 (×3): qty 100
  Filled 2023-12-06: qty 200
  Filled 2023-12-06 (×4): qty 100
  Filled 2023-12-06: qty 200
  Filled 2023-12-06 (×2): qty 100

## 2023-12-06 MED ORDER — ORAL CARE MOUTH RINSE: 15.0000 mL | OROMUCOSAL | Status: DC | PRN

## 2023-12-06 MED ORDER — POTASSIUM CHLORIDE CRYS ER 20 MEQ PO TBCR
40.0000 meq | EXTENDED_RELEASE_TABLET | Freq: Three times a day (TID) | ORAL | Status: AC
Start: 2023-12-06 — End: 2023-12-06
  Administered 2023-12-06 (×4): 40 meq via ORAL
  Filled 2023-12-06 (×2): qty 2

## 2023-12-06 MED ORDER — MORPHINE SULFATE (PF) 2 MG/ML IV SOLN: 4.0000 mg | INTRAVENOUS | Status: DC | PRN

## 2023-12-06 MED ORDER — ACETAMINOPHEN 325 MG PO TABS
650.0000 mg | ORAL_TABLET | Freq: Four times a day (QID) | ORAL | Status: DC | PRN
  Administered 2023-12-06 – 2023-12-26 (×7): 650 mg via ORAL
  Filled 2023-12-06 (×9): qty 2

## 2023-12-06 MED ORDER — INSULIN ASPART 100 UNIT/ML IJ SOLN
0.0000 [IU] | Freq: Three times a day (TID) | INTRAMUSCULAR | Status: DC
  Administered 2023-12-06 (×2): 3 [IU] via SUBCUTANEOUS

## 2023-12-06 MED ORDER — NICARDIPINE HCL IN NACL 20-0.86 MG/200ML-% IV SOLN
3.0000 mg/h | INTRAVENOUS | Status: DC
  Administered 2023-12-06: 12.5 mg/h via INTRAVENOUS
  Administered 2023-12-06: 10 mg/h via INTRAVENOUS
  Administered 2023-12-06: 5 mg/h via INTRAVENOUS
  Administered 2023-12-06: 10 mg/h via INTRAVENOUS
  Administered 2023-12-06: 12.5 mg/h via INTRAVENOUS
  Administered 2023-12-06: 5 mg/h via INTRAVENOUS
  Administered 2023-12-07: 12.5 mg/h via INTRAVENOUS
  Administered 2023-12-07 (×2): 5.5 mg/h via INTRAVENOUS
  Administered 2023-12-07 (×6): 3 mg/h via INTRAVENOUS
  Administered 2023-12-07: 12.5 mg/h via INTRAVENOUS
  Administered 2023-12-08 (×4): 5.5 mg/h via INTRAVENOUS
  Filled 2023-12-06 (×11): qty 200

## 2023-12-06 MED ORDER — IOHEXOL 300 MG/ML  SOLN
100.0000 mL | Freq: Once | INTRAMUSCULAR | Status: AC | PRN
  Administered 2023-12-06 (×2): 100 mL via INTRAVENOUS

## 2023-12-06 MED ORDER — HYDRALAZINE HCL 20 MG/ML IJ SOLN
10.0000 mg | INTRAMUSCULAR | Status: DC | PRN
  Administered 2023-12-06 (×2): 10 mg via INTRAVENOUS
  Filled 2023-12-06: qty 1

## 2023-12-06 MED ORDER — POTASSIUM CHLORIDE CRYS ER 20 MEQ PO TBCR
40.0000 meq | EXTENDED_RELEASE_TABLET | Freq: Once | ORAL | Status: AC
  Administered 2023-12-06 (×2): 40 meq via ORAL
  Filled 2023-12-06: qty 2

## 2023-12-06 MED ORDER — LABETALOL HCL 5 MG/ML IV SOLN
10.0000 mg | INTRAVENOUS | Status: DC | PRN
  Administered 2023-12-06 (×2): 10 mg via INTRAVENOUS
  Filled 2023-12-06: qty 4

## 2023-12-06 MED ORDER — ACETAMINOPHEN 650 MG RE SUPP: 650.0000 mg | Freq: Four times a day (QID) | RECTAL | Status: DC | PRN

## 2023-12-06 MED ORDER — ONDANSETRON HCL 4 MG PO TABS
4.0000 mg | ORAL_TABLET | Freq: Four times a day (QID) | ORAL | Status: DC | PRN
  Administered 2023-12-07 (×2): 4 mg via ORAL
  Filled 2023-12-06: qty 1

## 2023-12-06 MED ORDER — ALUM & MAG HYDROXIDE-SIMETH 200-200-20 MG/5ML PO SUSP
30.0000 mL | Freq: Once | ORAL | Status: AC
  Administered 2023-12-06 (×2): 30 mL via ORAL
  Filled 2023-12-06: qty 30

## 2023-12-06 MED ORDER — DILTIAZEM HCL ER COATED BEADS 240 MG PO CP24: 240.0000 mg | ORAL_CAPSULE | Freq: Every day | ORAL | Status: DC

## 2023-12-06 MED ORDER — ROSUVASTATIN CALCIUM 5 MG PO TABS
5.0000 mg | ORAL_TABLET | ORAL | Status: DC
  Administered 2023-12-06 – 2023-12-10 (×5): 5 mg via ORAL
  Filled 2023-12-06 (×3): qty 1

## 2023-12-06 MED ORDER — MORPHINE SULFATE (PF) 4 MG/ML IV SOLN
4.0000 mg | Freq: Once | INTRAVENOUS | Status: AC
  Administered 2023-12-06 (×2): 4 mg via INTRAVENOUS
  Filled 2023-12-06: qty 1

## 2023-12-06 MED ORDER — MORPHINE SULFATE (PF) 4 MG/ML IV SOLN: 4.0000 mg | INTRAVENOUS | Status: DC | PRN

## 2023-12-06 MED ORDER — DILTIAZEM HCL ER COATED BEADS 300 MG PO CP24
300.0000 mg | ORAL_CAPSULE | Freq: Every day | ORAL | Status: DC
  Filled 2023-12-06: qty 1

## 2023-12-06 MED ORDER — IOHEXOL 350 MG/ML SOLN
75.0000 mL | Freq: Once | INTRAVENOUS | Status: AC | PRN
  Administered 2023-12-06 (×2): 75 mL via INTRAVENOUS

## 2023-12-06 MED ORDER — ONDANSETRON HCL 4 MG/2ML IJ SOLN
4.0000 mg | Freq: Four times a day (QID) | INTRAMUSCULAR | Status: DC | PRN
  Administered 2023-12-09: 4 mg via INTRAVENOUS
  Filled 2023-12-06: qty 2

## 2023-12-06 NOTE — Progress Notes (Signed)
 Plan of Care Note for accepted transfer   Patient: Nicholas Burke MRN: [id number]   DOA: 12/06/2023  Blood pressure not well-controlled on antihypertensive boluses.  Case discussed with Dr. Lanis.  We will transfer to the PCCM service for antihypertensive infusion for better blood pressure control.  Case presented to PCCM who will take over his care.  Author: Alm Dorn Castor, MD 12/06/2023

## 2023-12-06 NOTE — ED Notes (Signed)
 Pt arrives via [facility] from [place]. Pt A&Ox4, endorses abd discomfort. Pain 3/10 since morphine  given before transport.

## 2023-12-06 NOTE — H&P (Signed)
 History and Physical    Patient: Nicholas Burke FMW:[id number] DOB: 1946/04/09 DOA: 12/06/2023 DOS: the patient was seen and examined on 12/06/2023 PCP: Hugh Charleston, MD (Inactive)  Patient coming from: Home  Chief Complaint:  Chief Complaint  Patient presents with   Abdominal Pain   HPI: Nicholas Burke is a 78 y.o. male with medical history significant of prostate cancer, history of radiation therapy, type 2 diabetes, mixed hyperlipidemia, hypertension, glaucoma who presented to the emergency department with complaints of epigastric abdominal pain that did not respond much to Gas-X.  He described the pain to a tightness going across epigastric area.  He has been constipated, but had a small bowel movement today.   No nausea, emesis, diarrhea, constipation, melena or hematochezia.  No flank pain, dysuria, He denied fever, chills, rhinorrhea, sore throat, wheezing or hemoptysis.  No chest pain, palpitations, diaphoresis, PND, orthopnea or pitting edema of the lower extremities.frequency or hematuria.  No polyuria, polydipsia, polyphagia or blurred vision.   Lab work: CBC, troponin x 2, lactic acid, lipase and LFTs were normal.  BMP showed potassium of 2.9 mmol/L and glucose of 147 mg/dL with the rest of the BMP measurements being normal.  Imaging: 2 view chest radiograph with no acute process.  CT abdomen/pelvis with contrast showed extensive intramural hematoma involving the descending thoracic aorta extending throughout the abdominal aorta and into the right inflow vessels terminating in the mid right external iliac artery.  While there is moderate narrowing of the IMA ostium from the intramural hematoma surrounding the vessel lumen, the remaining mesenteric and renal vessels are widely patent CTA of the chest recommended to evaluate for the proximal extent of the hematoma.  There is a moderate size fat-containing right inguinal hernia with tenting of the right lateral urinary bladder done into the  hernia sac.  No surrounding fluid or inflammation to suggest vascular compromise, at this time.  Cholecystolithiasis.  CTA chest aorta showed acute intramural hematoma beginning in the transverse aorta the origin of the left subclavian artery and extending throughout the thoracic aorta and into the abdominal aorta.  No evidence of involvement of the ascending aorta or aortic root.  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  Atherosclerotic vascular calcifications in the aorta and coronary arteries.  Dependent atelectasis in both lower lobes.  ED course: Initial vital signs were temperature 97.8 F, pulse 73, respirations 16, BP 152/84 mmHg O2 sat 98% on room air.  Patient received 30 mL of Maalox, morphine  4 mg IVP and KCl 40 mill equivalents p.o. x 1.   Review of Systems: As mentioned in the history of present illness. All other systems reviewed and are negative.  Past Medical History:  Diagnosis Date   Hx of radiation therapy 01/02/13- 02/24/13   prostate 7800 cGy/40 sessions, seminal vesicles 5600 cGy/40 sessions   Hypertension    Pre-diabetes    Prostate cancer (HCC) 08/30/2012   gleason 6, vol 65.7 cc   Past Surgical History:  Procedure Laterality Date   CATARACT EXTRACTION, BILATERAL Bilateral    COLONOSCOPY WITH PROPOFOL  N/A 10/18/2014   Procedure: COLONOSCOPY WITH PROPOFOL ;  Surgeon: [name], MD;  Location: [facility];  Service: Endoscopy;  Laterality: N/A;   CRYOABLATION N/A 12/24/2020   Procedure: CRYO ABLATION PROSTATE;  Surgeon: [name], MD;  Location: [facility];  Service: Urology;  Laterality: N/A;   EYE SURGERY Bilateral    cataract surgery   HOT HEMOSTASIS N/A  10/18/2014   Procedure: HOT HEMOSTASIS (ARGON PLASMA COAGULATION/BICAP);  Surgeon: [name], MD;  Location: [facility];  Service: Endoscopy;  Laterality: N/A;   PROSTATE BIOPSY  08/2012   Social History:  reports that he  has never smoked. He has never used smokeless tobacco. He reports that he does not drink alcohol and does not use drugs.  No Known Allergies  Family History  Problem Relation Age of Onset   Prostate cancer Father    Prostate cancer Brother     Prior to Admission medications   Medication Sig Start Date End Date Taking? Authorizing Provider  diltiazem  (CARDIZEM  CD) 240 MG 24 hr capsule Take 240 mg by mouth daily. 08/29/20  Yes [provider]  rosuvastatin  (CRESTOR ) 5 MG tablet Take 5 mg by mouth 3 (three) times a week. 11/21/20  Yes [provider]  finasteride (PROSCAR) 5 MG tablet Take 5 mg by mouth daily. Patient not taking: Reported on 12/06/2023    [provider]    Physical Exam: Vitals:   12/06/23 1330 12/06/23 1404 12/06/23 1415 12/06/23 1430  BP: 131/66 (!) 166/90 (!) 157/69 (!) 146/73  Pulse: 72 74 73 85  Resp: 17 17 16 16   Temp:  (!) 97.3 F (36.3 C)    TempSrc:  Oral    SpO2: 97% 100% 100% 100%  Weight:      Height:       Physical Exam Vitals reviewed.  Constitutional:      General: He is awake. He is not in acute distress.    Appearance: He is ill-appearing.  HENT:     Head: Normocephalic.     Nose: No rhinorrhea.     Mouth/Throat:     Mouth: Mucous membranes are moist.  Eyes:     General: No scleral icterus.    Pupils: Pupils are equal, round, and reactive to light.  Neck:     Vascular: No JVD.  Cardiovascular:     Rate and Rhythm: Normal rate and regular rhythm.     Heart sounds: S1 normal and S2 normal.  Pulmonary:     Effort: Pulmonary effort is normal.     Breath sounds: Normal breath sounds. No wheezing, rhonchi or rales.  Abdominal:     General: Abdomen is protuberant. Bowel sounds are normal. There is no distension.     Palpations: Abdomen is soft.     Tenderness: There is abdominal tenderness in the epigastric area.     Comments: Mild epigastric tenderness.  Musculoskeletal:     Cervical back: Neck supple.      Right lower leg: No edema.     Left lower leg: No edema.  Skin:    General: Skin is warm and dry.  Neurological:     General: No focal deficit present.     Mental Status: He is alert and oriented to person, place, and time.  Psychiatric:        Mood and Affect: Mood normal.        Behavior: Behavior normal. Behavior is cooperative.     Data Reviewed:  Results are pending, will review when available.  EKG: Vent. rate 69 BPM PR interval 182 ms QRS duration 104 ms QT/QTcB 417/447 ms P-R-T axes 63 49 261 Sinus rhythm Borderline repolarization abnormality  Assessment and Plan: Principal Problem:   Intramural aortic hematoma (HCC) Admit to inpatient/PCU. Analgesics as needed. Antiemetics as needed. Strict blood pressure control. Continue statin as below. Repeat CTA in 48 hours. Vascular surgery  consult appreciated.  Active Problems:   Essential hypertension Increase Cardizem  CD to 300 mg p.o. daily. Labetalol  10 mg IVP every 2 hours as needed. Hydralazine  10 mg IVP every 4 hours as needed.    Type 2 diabetes with complication (HCC) Carbohydrate modified diet. CBG monitoring with RI SS. Check hemoglobin A1c.    Mixed hyperlipidemia Continue rosuvastatin  5 mg p.o. daily.    Prostate cancer [facility]) Follow-up with urology as scheduled.    Glaucoma Follow-up of ophthalmology as an outpatient.   Advance Care Planning:   Code Status: Full Code   Consults: Vascular surgery (Dr. Fonda CHARLENA Rim).  Family Communication:   Severity of Illness: The appropriate patient status for this patient is INPATIENT. Inpatient status is judged to be reasonable and necessary in order to provide the required intensity of service to ensure the patient's safety. The patient's presenting symptoms, physical exam findings, and initial radiographic and laboratory data in the context of their chronic comorbidities is felt to place them at high risk for further clinical deterioration.  Furthermore, it is not anticipated that the patient will be medically stable for discharge from the hospital within 2 midnights of admission.   * I certify that at the point of admission it is my clinical judgment that the patient will require inpatient hospital care spanning beyond 2 midnights from the point of admission due to high intensity of service, high risk for further deterioration and high frequency of surveillance required.*  Author: Alm Dorn Castor, MD 12/06/2023 3:22 PM  For on call review [email or web address].   This document was prepared using Dragon voice recognition software and may contain some unintended transcription errors.

## 2023-12-06 NOTE — ED Provider Notes (Signed)
 [place] EMERGENCY DEPARTMENT AT [facility] Provider Note   CSN: [id number] Arrival date & time: 12/06/23  9048     Patient presents with: Abdominal Pain   Nicholas Burke is a 78 y.o. male.   This is a 78 year old male presenting emergency department for upper abdominal pain that started this morning.  Described as a tightness.  No associated nausea or vomiting, had a small bowel movement today, but has had some constipation.  Has been in his normal state of health for the past few days, no chest pain shortness of breath lightheadedness.  Reports it feels like gas, but had little improvement after Gas-X   Abdominal Pain      Prior to Admission medications   Medication Sig Start Date End Date Taking? Authorizing Provider  diltiazem  (CARDIZEM  CD) 240 MG 24 hr capsule Take 240 mg by mouth daily. 08/29/20  Yes [provider]  rosuvastatin  (CRESTOR ) 5 MG tablet Take 5 mg by mouth 3 (three) times a week. 11/21/20  Yes [provider]  finasteride (PROSCAR) 5 MG tablet Take 5 mg by mouth daily. Patient not taking: Reported on 12/06/2023    [provider]    Allergies: Patient has no known allergies.    Review of Systems  Gastrointestinal:  Positive for abdominal pain.    Updated Vital Signs BP (!) 146/73   Pulse 85   Temp (!) 97.3 F (36.3 C) (Oral)   Resp 16   Ht 5' 10 (1.778 m)   Wt 90.7 kg   SpO2 100%   BMI 28.70 kg/m   Physical Exam Vitals and nursing note reviewed.  Constitutional:      General: He is not in acute distress.    Appearance: He is not toxic-appearing.  HENT:     Head: Normocephalic.  Cardiovascular:     Rate and Rhythm: Normal rate.  Pulmonary:     Effort: Pulmonary effort is normal.  Abdominal:     Tenderness: There is abdominal tenderness (mild) in the right upper quadrant, epigastric area and left upper quadrant. There is no guarding or rebound.  Skin:    General: Skin is warm and dry.     Capillary  Refill: Capillary refill takes less than 2 seconds.  Neurological:     Mental Status: He is alert and oriented to person, place, and time.  Psychiatric:        Mood and Affect: Mood normal.        Behavior: Behavior normal.     (all labs ordered are listed, but only abnormal results are displayed) Labs Reviewed  BASIC METABOLIC PANEL WITH GFR - Abnormal; Notable for the following components:      Result Value   Potassium 2.9 (*)    Glucose, Bld 147 (*)    All other components within normal limits  CBC  HEPATIC FUNCTION PANEL  LIPASE, BLOOD  LACTIC ACID, PLASMA  TROPONIN I (HIGH SENSITIVITY)  TROPONIN I (HIGH SENSITIVITY)    EKG: EKG Interpretation Date/Time:  Monday December 06 2023 10:01:15 EDT Ventricular Rate:  69 PR Interval:  182 QRS Duration:  104 QT Interval:  417 QTC Calculation: 447 R Axis:   49  Text Interpretation: Sinus rhythm Borderline repolarization abnormality Confirmed by Neysa Clap [phone]) on 12/06/2023 10:56:48 AM  Radiology: CT ABDOMEN PELVIS W CONTRAST Result Date: 12/06/2023 CLINICAL DATA:  Bowel obstruction suspected EXAM: CT ABDOMEN AND PELVIS WITH CONTRAST TECHNIQUE: Multidetector CT imaging of the abdomen and pelvis was performed using  the standard protocol following bolus administration of intravenous contrast. RADIATION DOSE REDUCTION: This exam was performed according to the departmental dose-optimization program which includes automated exposure control, adjustment of the mA and/or kV according to patient size and/or use of iterative reconstruction technique. CONTRAST:  OMNIPAQUE  IOHEXOL  300 MG/ML  SOLN COMPARISON:  May 11, 2024 FINDINGS: Lower chest: No focal airspace consolidation or pleural effusion.Posterior bibasilar dependent atelectasis. Hepatobiliary: No mass.A couple of small radiopaque gallstones. No wall thickening of the gallbladder. No intrahepatic or extrahepatic biliary ductal dilation. The portal veins are patent. Pancreas:  No mass or main ductal dilation. No peripancreatic inflammation or fluid collection. Spleen: Normal size. No mass. Adrenals/Urinary Tract: No adrenal masses. A couple of subcentimeter hypodensities are noted in the kidneys, too small to definitively characterize, but likely small cysts. No nephrolithiasis or hydronephrosis. The urinary bladder is completely decompressed. Stomach/Bowel: The stomach is decompressed without focal abnormality. No small bowel wall thickening or inflammation. No small bowel obstruction.Normal appendix. Vascular/Lymphatic: The descending thoracic aorta is dilated measuring 3.4 cm. There is extensive intramural hematoma extending from the descending thoracic aorta throughout the abdominal aorta and extending into the right common and proximal and mid external iliac arteries. No periaortic fluid collection. Diffuse aortoiliac atherosclerosis. The celiac, superior mesenteric, and both renal arteries are patent and well perfused. There is moderate narrowing of the IMA ostium from the intramural hematoma surrounding the vessel lumen. No intraabdominal or pelvic lymphadenopathy. Reproductive: No prostatomegaly.No free pelvic fluid. Other: No pneumoperitoneum, ascites, or mesenteric inflammation. Musculoskeletal: No acute fracture or destructive lesion. Multilevel degenerative disc disease of the spine. Thoracic DISH. IMPRESSION: 1. Extensive intramural hematoma involving the descending thoracic aorta extending throughout the abdominal aorta and into the right inflow vessels terminating in the mid right external iliac artery. While there is moderate narrowing of the IMA ostium from the intramural hematoma surrounding the vessel lumen, the remaining mesenteric and renal vessels are widely patent. A follow-up CTA of the chest is recommended to evaluate for the proximal extent of the hematoma. 2. Moderate-sized fat containing right inguinal hernia, with tenting of the right lateral urinary bladder  dome into the hernia sac. No surrounding fluid or inflammation to suggest vascular compromise, at this time. 3. Cholecystolithiasis. Critical Value/emergent results were called by telephone at the time of interpretation on 12/06/2023 at 12:45 pm to provider [facility] , who verbally acknowledged these results. Electronically Signed   By: Rogelia Myers M.D.   On: 12/06/2023 12:57   DG Chest 2 View Result Date: 12/06/2023 EXAM: 2 VIEW(S) XRAY OF THE CHEST 12/06/2023 10:13:00 AM COMPARISON: PA and lateral radiographs of the chest dated 02/16/2019. CLINICAL HISTORY: Chest pain. Developed epigastric pain this morning, took gas with a little relief. Patient states he feels like it is gas. Describes as tightness across epigastric area. FINDINGS: LUNGS AND PLEURA: No focal pulmonary opacity. No pulmonary edema. No pleural effusion. No pneumothorax. HEART AND MEDIASTINUM: No acute abnormality of the cardiac and mediastinal silhouettes. BONES AND SOFT TISSUES: No acute osseous abnormality. IMPRESSION: 1. No acute process. Electronically signed by: evalene coho 12/06/2023 10:48 AM EDT RP Workstation: [id number]     .Critical Care  Performed by: Neysa Caron PARAS, DO Authorized by: Neysa Caron PARAS, DO   Critical care provider statement:    Critical care time (minutes):  30   Critical care was necessary to treat or prevent imminent or life-threatening deterioration of the following conditions:  Circulatory failure   Critical care was time spent personally by  me on the following activities:  Development of treatment plan with patient or surrogate, discussions with consultants, evaluation of patient's response to treatment, examination of patient, ordering and review of laboratory studies, ordering and review of radiographic studies, ordering and performing treatments and interventions, pulse oximetry, re-evaluation of patient's condition and review of old charts    Medications Ordered in the ED  alum & mag  hydroxide-simeth (MAALOX/MYLANTA) 200-200-20 MG/5ML suspension 30 mL (30 mLs Oral Given 12/06/23 1151)  potassium chloride  SA (KLOR-CON  M) CR tablet 40 mEq (40 mEq Oral Given 12/06/23 1150)  iohexol  (OMNIPAQUE ) 300 MG/ML solution 100 mL (100 mLs Intravenous Contrast Given 12/06/23 1114)  morphine  (PF) 4 MG/ML injection 4 mg (4 mg Intravenous Given 12/06/23 1339)  iohexol  (OMNIPAQUE ) 350 MG/ML injection 75 mL (75 mLs Intravenous Contrast Given 12/06/23 1419)    Clinical Course as of 12/06/23 1519  Mon Dec 06, 2023  1055 CBC No leukocytosis to suggest infectious process.  No anemia [TY]  1055 Basic metabolic panel(!) Hypokalemia noted.  Repleted.  Normal kidney function [TY]  1055 Troponin I (High Sensitivity): 10 ACS less likely, but will get delta troponin [TY]  1055 DG Chest 2 View Do not appreciate obvious pneumonia or pneumothorax on my independent review of images [TY]  1056 DG Chest 2 View IMPRESSION: 1. No acute process.   [TY]  1111 Hepatic function panel Normal hepatic function panel.  Hepatobiliary disease less likely [TY]  1111 Lipase: 37 Pancreatitis unlikely [TY]  1143 CT ABDOMEN PELVIS W CONTRAST I do not appreciate obvious free air on his CT scan. [TY]  1302 CT ABDOMEN PELVIS W CONTRAST IMPRESSION: 1. Extensive intramural hematoma involving the descending thoracic aorta extending throughout the abdominal aorta and into the right inflow vessels terminating in the mid right external iliac artery. While there is moderate narrowing of the IMA ostium from the intramural hematoma surrounding the vessel lumen, the remaining mesenteric and renal vessels are widely patent. A follow-up CTA of the chest is recommended to evaluate for the proximal extent of the hematoma. 2. Moderate-sized fat containing right inguinal hernia, with tenting of the right lateral urinary bladder dome into the hernia sac. No surrounding fluid or inflammation to suggest vascular compromise, at this  time. 3. Cholecystolithiasis.  Critical Value/emergent results were called by telephone at the time of interpretation on 12/06/2023 at 12:45 pm to provider [facility] , who verbally acknowledged these results.   Electronically Signed   By: Rogelia Myers M.D.   On: 12/06/2023 12:57     [TY]  1357 Case was discussed with vascular surgery, recommending transfer to [facility] for vascular surgery evaluation, but requesting admission to medicine.  Recommending pain and blood pressure control.  Patient currently normotensive 127/72.  Morphine  ordered for pain.  I did discuss with Dr. Celinda, hospitalist at [facility] for admission, he will try to see patient prior to transport, but may need evaluation by [facility] hospitalist team [TY]    Clinical Course User Index [TY] Neysa Caron PARAS, DO                                 Medical Decision Making Is a 78 year old male presenting emergency department for epigastric abdominal pain.  Is afebrile nontachycardic, slightly hypertensive.  Physical exam with some minor tenderness in his upper abdomen.  Will get basic abdominal screening labs.  Given his age, will also get cardiac workup for atypical  chest pain evaluation.  We will also get CT abdomen to evaluate for obstruction/mass. See ED course for further MDM/disposition.   Amount and/or Complexity of Data Reviewed Independent Historian:     Details: Family member notes taking Gas-X External Data Reviewed:     Details: History of prostate cancer.  Has CT abdomen close to 10 years ago that had no significant abnormalities per my review Labs: ordered. Decision-making details documented in ED Course. Radiology: ordered and independent interpretation performed. Decision-making details documented in ED Course. ECG/medicine tests: independent interpretation performed.    Details: No STEMI Discussion of management or test interpretation with external provider(s): Vascular surgery,  hospitalist  Risk OTC drugs. Prescription drug management. Parenteral controlled substances. Decision regarding hospitalization. Diagnosis or treatment significantly limited by social determinants of health. Risk Details: Poor health literacy       Final diagnoses:  Intramural aortic hematoma [facility])    ED Discharge Orders     None          Neysa Caron PARAS, DO 12/06/23 1519

## 2023-12-06 NOTE — ED Notes (Signed)
 [facility] called.

## 2023-12-06 NOTE — ED Provider Notes (Signed)
 [place] EMERGENCY DEPARTMENT AT [facility] Provider Note  MDM   HPI/ROS:  Nicholas Burke is a 78 y.o. male with a medical history as below who presents as a transfer from outside hospital due to upper abdominal pain that started this morning.  Described as tightness.  He does report some constipation however was told he has a problem with his aorta at outside hospital.  His pain persist but states its manageable at this time and has not changed in character or intensity.  Physical exam is notable for: - Well-appearing, hemodynamically intact.  Mild epigastric tenderness to palpitation  On my initial evaluation, patient is:  -Vital signs stable. Patient afebrile, hemodynamically stable, and non-toxic appearing. -Additional history obtained from chart review  Differentials include aortic hematoma, dissection, ruptured aneurysm.    On my initial assessment patient is hemodynamically intact.  Mildly hypertensive however improved on repeat check.  On review of outside imaging and patient found to have large aortic intramural hematoma.  Vascular surgery was made aware at outside hospital, did reach out to let them know about patient's arrival here.  Recommend admission to medicine.  He was admitted to progressive care in stable condition  Interpretations, interventions, and the patient's course of care are documented below.    Clinical Course as of 12/06/23 1413  Mon Dec 06, 2023  1055 CBC No leukocytosis to suggest infectious process.  No anemia [TY]  1055 Basic metabolic panel(!) Hypokalemia noted.  Repleted.  Normal kidney function [TY]  1055 Troponin I (High Sensitivity): 10 ACS less likely, but will get delta troponin [TY]  1055 DG Chest 2 View Do not appreciate obvious pneumonia or pneumothorax on my independent review of images [TY]  1056 DG Chest 2 View IMPRESSION: 1. No acute process.   [TY]  1111 Hepatic function panel Normal hepatic function panel.  Hepatobiliary  disease less likely [TY]  1111 Lipase: 37 Pancreatitis unlikely [TY]  1143 CT ABDOMEN PELVIS W CONTRAST I do not appreciate obvious free air on his CT scan. [TY]  1302 CT ABDOMEN PELVIS W CONTRAST IMPRESSION: 1. Extensive intramural hematoma involving the descending thoracic aorta extending throughout the abdominal aorta and into the right inflow vessels terminating in the mid right external iliac artery. While there is moderate narrowing of the IMA ostium from the intramural hematoma surrounding the vessel lumen, the remaining mesenteric and renal vessels are widely patent. A follow-up CTA of the chest is recommended to evaluate for the proximal extent of the hematoma. 2. Moderate-sized fat containing right inguinal hernia, with tenting of the right lateral urinary bladder dome into the hernia sac. No surrounding fluid or inflammation to suggest vascular compromise, at this time. 3. Cholecystolithiasis.  Critical Value/emergent results were called by telephone at the time of interpretation on 12/06/2023 at 12:45 pm to provider [facility] , who verbally acknowledged these results.   Electronically Signed   By: Rogelia Myers M.D.   On: 12/06/2023 12:57     [TY]  1357 Case was discussed with vascular surgery, recommending transfer to [facility] for vascular surgery evaluation, but requesting admission to medicine.  Recommending pain and blood pressure control.  Patient currently normotensive 127/72.  Morphine  ordered for pain.  I did discuss with Dr. Celinda, hospitalist at [facility] for admission, he will try to see patient prior to transport, but may need evaluation by [facility] hospitalist team [TY]    Clinical Course User Index [TY] Neysa Caron PARAS, DO  Disposition: Admit to hospitalist  Clinical Impression:  1. Intramural aortic hematoma (HCC)     The plan for this patient was discussed with Dr. Bernard, who voiced agreement and who oversaw evaluation and  treatment of this patient.   Clinical Complexity A medically appropriate history, review of systems, and physical exam was performed.  My independent interpretations of EKG, labs, and radiology are documented in the ED course above.   If decision rules were used in this patient's evaluation, they are listed below.   Click here for ABCD2, HEART and other calculatorsREFRESH Note before signing   Patient's presentation is most consistent with acute presentation with potential threat to life or bodily function.  Medical Decision Making Amount and/or Complexity of Data Reviewed Labs: ordered. Decision-making details documented in ED Course. Radiology: ordered. Decision-making details documented in ED Course.  Risk OTC drugs. Prescription drug management. Decision regarding hospitalization.    HPI/ROS      See MDM section for pertinent HPI and ROS. A complete ROS was performed with pertinent positives/negatives noted above.   Past Medical History:  Diagnosis Date   Hx of radiation therapy 01/02/13- 02/24/13   prostate 7800 cGy/40 sessions, seminal vesicles 5600 cGy/40 sessions   Hypertension    Pre-diabetes    Prostate cancer (HCC) 08/30/2012   gleason 6, vol 65.7 cc    Past Surgical History:  Procedure Laterality Date   CATARACT EXTRACTION, BILATERAL Bilateral    COLONOSCOPY WITH PROPOFOL  N/A 10/18/2014   Procedure: COLONOSCOPY WITH PROPOFOL ;  Surgeon: [name], MD;  Location: [facility];  Service: Endoscopy;  Laterality: N/A;   CRYOABLATION N/A 12/24/2020   Procedure: CRYO ABLATION PROSTATE;  Surgeon: [name], MD;  Location: [facility];  Service: Urology;  Laterality: N/A;   EYE SURGERY Bilateral    cataract surgery   HOT HEMOSTASIS N/A 10/18/2014   Procedure: HOT HEMOSTASIS (ARGON PLASMA COAGULATION/BICAP);  Surgeon: [name], MD;  Location: [facility];  Service: Endoscopy;  Laterality: N/A;   PROSTATE BIOPSY  08/2012      Physical Exam    Vitals:   12/06/23 1007 12/06/23 1305 12/06/23 1330 12/06/23 1404  BP:  127/72 131/66 (!) 166/90  Pulse:  68 72 74  Resp:  18 17 17   Temp: 97.8 F (36.6 C) 97.6 F (36.4 C)  (!) 97.3 F (36.3 C)  TempSrc: Oral Oral  Oral  SpO2:  98% 97% 100%  Weight:      Height:        Physical Exam Vitals and nursing note reviewed.  Constitutional:      General: He is not in acute distress.    Appearance: He is well-developed.  HENT:     Head: Normocephalic and atraumatic.  Eyes:     Conjunctiva/sclera: Conjunctivae normal.  Cardiovascular:     Rate and Rhythm: Normal rate and regular rhythm.     Heart sounds: No murmur heard. Pulmonary:     Effort: Pulmonary effort is normal. No respiratory distress.     Breath sounds: Normal breath sounds.  Abdominal:     Palpations: Abdomen is soft.     Tenderness: There is abdominal tenderness in the epigastric area.  Musculoskeletal:        General: No swelling.     Cervical back: Neck supple.  Skin:    General: Skin is warm and dry.     Capillary Refill: Capillary refill takes less than 2 seconds.  Neurological:     Mental Status: He is alert.  Psychiatric:  Mood and Affect: Mood normal.      Procedures   If procedures were preformed on this patient, they are listed below:  Procedures   @BBSIG @   Please note that this documentation was produced with the assistance of voice-to-text technology and may contain errors.    Sharyne Darina RAMAN, MD 12/06/23 1609    Bernard Drivers, MD 12/07/23 1501

## 2023-12-06 NOTE — ED Triage Notes (Addendum)
 Developed epigastric pain this morning, took gas x with a little relief. Pt states he feels like it is gas. Describes as tightness across epigastric area

## 2023-12-06 NOTE — Consult Note (Addendum)
 Hospital Consult    Reason for Consult:  intramural hematoma of descending thoracic aorta Requesting Physician:  Dr. Bernard MRN #:  [id number]  History of Present Illness: This is a 78 y.o. male with pertinent medical history of HTN and prostate cancer who presented to [facility] ED earlier today with epigastric abdominal pain. CT Abdomen showing extensive intramural hematoma of the descending thoracic aorta extending throughout abdominal aorta and into the right external iliac artery. Recommendation was for transfer to [facility] for further evaluation and management. In ED, he explains that this started around 8 am. It has been a constant pressure/ tightness, what he says feels like a gas pain across top of his abdomen. He denies any radiation to his back or between shoulder blades, no pain or discomfort down his legs. He says he took some Gas-X at home with some relief. However when it continued he presented to ED. He says he did also have BM which relieved a little of his discomfort. He denies any chest pain, shortness of breath, nausea or vomiting. He explains that as far as he knows his blood pressure has been well controlled on his home regimen, but he does not check his BP at home regularly. He has never experienced this pain before. He reports otherwise being in normal state of health until this morning. SBO in 130-152 mmHg since arrival. HR 65-85. He is a non smoker. Does not use any drugs. No family history of AAA or dissection.  Past Medical History:  Diagnosis Date   Hx of radiation therapy 01/02/13- 02/24/13   prostate 7800 cGy/40 sessions, seminal vesicles 5600 cGy/40 sessions   Hypertension    Pre-diabetes    Prostate cancer (HCC) 08/30/2012   gleason 6, vol 65.7 cc    Past Surgical History:  Procedure Laterality Date   CATARACT EXTRACTION, BILATERAL Bilateral    COLONOSCOPY WITH PROPOFOL  N/A 10/18/2014   Procedure: COLONOSCOPY WITH PROPOFOL ;  Surgeon: [name], MD;  Location: [facility]  ENDOSCOPY;  Service: Endoscopy;  Laterality: N/A;   CRYOABLATION N/A 12/24/2020   Procedure: CRYO ABLATION PROSTATE;  Surgeon: [name], MD;  Location: [facility] ORS;  Service: Urology;  Laterality: N/A;   EYE SURGERY Bilateral    cataract surgery   HOT HEMOSTASIS N/A 10/18/2014   Procedure: HOT HEMOSTASIS (ARGON PLASMA COAGULATION/BICAP);  Surgeon: [name], MD;  Location: [facility];  Service: Endoscopy;  Laterality: N/A;   PROSTATE BIOPSY  08/2012    No Known Allergies  Prior to Admission medications   Medication Sig Start Date End Date Taking? Authorizing Provider  cephALEXin  (KEFLEX ) 500 MG capsule Take 1 capsule (500 mg total) by mouth 4 (four) times daily. 09/25/23   Raspet, Erin K, PA-C  diltiazem  (CARDIZEM  CD) 240 MG 24 hr capsule Take 240 mg by mouth daily. 08/29/20   [provider]  finasteride (PROSCAR) 5 MG tablet Take 5 mg by mouth daily.    [provider]  rosuvastatin  (CRESTOR ) 5 MG tablet Take 5 mg by mouth 3 (three) times a week. 11/21/20   [provider]    Social History   Socioeconomic History   Marital status: Married    Spouse name: Not on file   Number of children: 4   Years of education: Not on file   Highest education level: Not on file  Occupational History    Employer: [employer]  Tobacco Use   Smoking status: Never   Smokeless tobacco: Never  Vaping Use   Vaping status: Never Used  Substance and Sexual Activity   Alcohol use: No   Drug use: No   Sexual activity: Not on file  Other Topics Concern   Not on file  Social History Narrative   Not on file   Social Drivers of Health   Financial Resource Strain: Not on file  Food Insecurity: Not on file  Transportation Needs: Not on file  Physical Activity: Not on file  Stress: Not on file  Social Connections: Not on file  Intimate Partner Violence: Not on file     Family History  Problem Relation Age of Onset   Prostate cancer Father    Prostate  cancer Brother     ROS: Otherwise negative unless mentioned in HPI  Physical Examination  Vitals:   12/06/23 1330 12/06/23 1404  BP: 131/66 (!) 166/90  Pulse: 72 74  Resp: 17 17  Temp:  (!) 97.3 F (36.3 C)  SpO2: 97% 100%   Body mass index is 28.7 kg/m.  General:  WDWN; in no acute distress  Gait: Not observed HENT: WNL, normocephalic Pulmonary: normal non-labored breathing, without  wheezing Cardiac: regular Abdomen: soft, minimal epigastric tenderness to palpation Vascular Exam/Pulses: 2+ radial, 2+ femoral, 2+ DP pulses bilaterally Extremities: without ischemic changes, without Gangrene , without cellulitis; without open wounds;  Musculoskeletal: no muscle wasting or atrophy  Neurologic: A&O X 3;  No focal weakness or paresthesias are detected; speech is fluent/normal Psychiatric:  The pt has Normal affect. Lymph:  Unremarkable  CBC    Component Value Date/Time   WBC 7.3 12/06/2023 1007   RBC 5.55 12/06/2023 1007   HGB 15.0 12/06/2023 1007   HCT 46.9 12/06/2023 1007   PLT 216 12/06/2023 1007   MCV 84.5 12/06/2023 1007   MCH 27.0 12/06/2023 1007   MCHC 32.0 12/06/2023 1007   RDW 13.9 12/06/2023 1007    BMET    Component Value Date/Time   NA 143 12/06/2023 1007   K 2.9 (L) 12/06/2023 1007   CL 105 12/06/2023 1007   CO2 25 12/06/2023 1007   GLUCOSE 147 (H) 12/06/2023 1007   BUN 20 12/06/2023 1007   CREATININE 1.16 12/06/2023 1007   CALCIUM  9.2 12/06/2023 1007   GFRNONAA >60 12/06/2023 1007   GFRAA >60 02/16/2019 0319    COAGS: Lab Results  Component Value Date   INR 1.0 02/16/2019     Non-Invasive Vascular Imaging:   CT Angio Chest Aorta W/CM & or WO/CM: IMPRESSION: 1. Positive for acute intramural hematoma beginning in the transverse aorta at the origin of the left subclavian artery and extending throughout the thoracic aorta and into the abdominal aorta. No evidence of involvement of the ascending aorta or aortic root. 2. Secondary  aneurysmal dilation of the proximal descending thoracic aorta with a maximal diameter of 4.3 cm. 3. Atherosclerotic vascular calcifications in the aorta and coronary arteries. 4. Dependent atelectasis in both lower lobes.  CT Abdomen Pelvis w contrast: IMPRESSION: 1. Extensive intramural hematoma involving the descending thoracic aorta extending throughout the abdominal aorta and into the right inflow vessels terminating in the mid right external iliac artery. While there is moderate narrowing of the IMA ostium from the intramural hematoma surrounding the vessel lumen, the remaining mesenteric and renal vessels are widely patent. A follow-up CTA of the chest is recommended to evaluate for the proximal extent of the hematoma. 2. Moderate-sized fat containing right inguinal hernia, with tenting of the right lateral urinary bladder dome into the hernia sac. No surrounding fluid or inflammation  to suggest vascular compromise, at this time. 3. Cholecystolithiasis.  Statin:  Yes.   Beta Blocker:  No. Aspirin:  No. ACEI:  No. ARB:  No. CCB use:  Yes Other antiplatelets/anticoagulants:  No.    ASSESSMENT/PLAN: This is a 78 y.o. male with PMH of prostate cancer and HTN who presented to [facility] ED with epigastric abdominal pain that started earlier this morning. Some improvement with Gas-X and following BM. CTA showing extensive intramural hematoma of the descending thoracic aorta extending into the right common iliac artery. Patient is hemodynamically stable. No signs of malperfusion of organs or of his extremities. He has been given Morphine  for pain control. He has been normotensive but recommend keeping SBP < 120 and HR < 60. Recommend admission to Hospitalists [facility] and will plan to repeat CTA in 48 hours. The on call Vascular Surgeon, Dr. Lanis will see patient later this afternoon to provide further management recommendations   Teretha Charlyne RIGGERS [facility] [phone] 12/06/2023  2:27 PM  VASCULAR STAFF ADDENDUM: I have independently interviewed and examined the patient. I agree with the above.  In short, patient is a 78 year old gentleman with history of hypertension who presented with epigastric pain and imaging demonstrating aortic intramural hematoma extending from zone 3 through 10R. On exam, he remained hypertensive.  States that he takes his medications as directed, but does not take his blood pressure at home. Yaniel would benefit from ICU admission, labetalol , nitroglycerin drips to a blood pressure less than 120 systolic, heart rate less than 80. Plan to reimage at the 48-hour mark. He is aware that the best course of treatment for aortic intramural hematomas medical management.  Will follow closely. Will see tomorrow morning.  Fonda FORBES Lanis MD [facility] Phone Number: [phone] 12/06/2023 5:32 PM

## 2023-12-06 NOTE — Consult Note (Addendum)
 NAME:  Nicholas Burke, MRN:  [id number], DOB:  May 15, 1945, LOS: 0 ADMISSION DATE:  12/06/2023, CONSULTATION DATE:  12/06/2023 REFERRING MD: Dr. Celinda - [facility], CHIEF COMPLAINT: Intramural hematoma  History of Present Illness:  Nicholas Burke is a 78 year old male with a past medical history significant for prediabetes, hypertension, and prostate cancer who presented to the ED at [facility] 8/11 for complaints of epigastric pain.  On ED arrival patient was seen hypertensive with all other vital signs within normal limit.  CT abdomen pelvis obtained and revealed extensive intramural hematoma involving the descending thoracic aorta extending throughout the abdominal aorta and into the right external iliac artery.  CTA chest again confirms intramural hematoma beginning at the transverse aorta origin of the left subclavian artery and extending throughout the thoracic aorta and into the abdominal aorta.  Lab work significant for potassium 2.9.  Vascular surgery evaluated patient in ED and recommended tight blood pressure and heart rate control, PCCM consulted in this setting   Pertinent  Medical History  prediabetes, hypertension, and prostate cancer  Significant Hospital Events: Including procedures, antibiotic start and stop dates in addition to other pertinent events   8/11 presented with epigastric abdominal pain found to have large intramural hematoma  Interim History / Subjective:  Reports slightly improved pain  Objective    Blood pressure (!) 172/101, pulse 82, temperature (!) 97.3 F (36.3 C), temperature source Oral, resp. rate (!) 22, height 5' 10 (1.778 m), weight 90.7 kg, SpO2 100%.       No intake or output data in the 24 hours ending 12/06/23 1743 Filed Weights   12/06/23 0958  Weight: 90.7 kg    Examination: General: Well appearing elderly male sitting up in bed after ambulating to bathroom in no acute distress HEENT: [place]/AT, MM pink/moist, PERRL,  Neuro: Alert and oriented x 3,  nonfocal CV: s1s2 regular rate and rhythm, no murmur, rubs, or gallops,  PULM: Auscultation bilaterally, no increased work of breathing, no added breath sounds GI: soft, bowel sounds active in all 4 quadrants, non-tender, non-distended, tolerating oral diet Extremities: warm/dry, no edema  Skin: no rashes or lesions  Resolved problem list   Assessment and Plan  Large intramural hematoma- -CT abdomen pelvis obtained and revealed extensive intramural hematoma involving the descending thoracic aorta extending throughout the abdominal aorta and into the right external iliac artery.  CTA chest again confirms intramural hematoma beginning at the transverse aorta origin of the left subclavian artery and extending throughout the thoracic aorta and into the abdominal aorta.  P: Primary management per vascular SBP goal less than 120 Heart rate goal less than 60 Cardene  and esmolol  drips as needed Continuous telemetry Ensure adequate pain control Repeat imaging per vascular  Essential hypertension Hyperlipidemia -Medications include Cardizem  and Crestor  P: Continue home medication Continuous telemetry   Type 2 diabetes P: SSI  CBG goal 140-180 CBG checks q4  Hypokalemia  P: Supplement   History of prostate cancer P: Outpatient follow up    Best Practice (right click and Reselect all SmartList Selections daily)   Diet/type: Regular consistency (see orders) DVT prophylaxis SCD Pressure ulcer(s): N/A GI prophylaxis: N/A Lines: N/A Foley:  N/A Code Status:  full code Last date of multidisciplinary goals of care discussion: Pending   Labs   CBC: Recent Labs  Lab 12/06/23 1007  WBC 7.3  HGB 15.0  HCT 46.9  MCV 84.5  PLT 216    Basic Metabolic Panel: Recent Labs  Lab 12/06/23 1007 12/06/23  1529  NA 143  --   K 2.9*  --   CL 105  --   CO2 25  --   GLUCOSE 147*  --   BUN 20  --   CREATININE 1.16  --   CALCIUM  9.2  --   MG  --  1.9  PHOS  --  2.9    GFR: Estimated Creatinine Clearance: 61.4 mL/min (by C-G formula based on SCr of 1.16 mg/dL). Recent Labs  Lab 12/06/23 1007 12/06/23 1053  WBC 7.3  --   LATICACIDVEN  --  1.9    Liver Function Tests: Recent Labs  Lab 12/06/23 1007  AST 24  ALT 14  ALKPHOS 38  BILITOT 0.9  PROT 7.3  ALBUMIN  3.8   Recent Labs  Lab 12/06/23 1007  LIPASE 37   No results for input(s): AMMONIA in the last 168 hours.  ABG No results found for: PHART, PCO2ART, PO2ART, HCO3, TCO2, ACIDBASEDEF, O2SAT   Coagulation Profile: No results for input(s): INR, PROTIME in the last 168 hours.  Cardiac Enzymes: No results for input(s): CKTOTAL, CKMB, CKMBINDEX, TROPONINI in the last 168 hours.  HbA1C: Hgb A1c MFr Bld  Date/Time Value Ref Range Status  12/17/2020 10:03 AM 6.8 (H) 4.8 - 5.6 % Final    Comment:    (NOTE) Pre diabetes:          5.7%-6.4%  Diabetes:              >6.4%  Glycemic control for   <7.0% adults with diabetes     CBG: Recent Labs  Lab 12/06/23 1643  GLUCAP 181*    Review of Systems:   Please see the history of present illness. All other systems reviewed and are negative   Past Medical History:  He,  has a past medical history of radiation therapy (01/02/13- 02/24/13), Hypertension, Pre-diabetes, and Prostate cancer (HCC) (08/30/2012).   Surgical History:   Past Surgical History:  Procedure Laterality Date   CATARACT EXTRACTION, BILATERAL Bilateral    COLONOSCOPY WITH PROPOFOL  N/A 10/18/2014   Procedure: COLONOSCOPY WITH PROPOFOL ;  Surgeon: [name], MD;  Location: [facility];  Service: Endoscopy;  Laterality: N/A;   CRYOABLATION N/A 12/24/2020   Procedure: CRYO ABLATION PROSTATE;  Surgeon: [name], MD;  Location: [facility];  Service: Urology;  Laterality: N/A;   EYE SURGERY Bilateral    cataract surgery   HOT HEMOSTASIS N/A 10/18/2014   Procedure: HOT HEMOSTASIS (ARGON PLASMA COAGULATION/BICAP);  Surgeon: [name], MD;  Location: [facility];  Service: Endoscopy;  Laterality: N/A;   PROSTATE BIOPSY  08/2012     Social History:   reports that he has never smoked. He has never used smokeless tobacco. He reports that he does not drink alcohol and does not use drugs.   Family History:  His family history includes Prostate cancer in his brother and father.   Allergies No Known Allergies   Home Medications  Prior to Admission medications   Medication Sig Start Date End Date Taking? Authorizing Provider  diltiazem  (CARDIZEM  CD) 240 MG 24 hr capsule Take 240 mg by mouth daily. 08/29/20  Yes [provider]  rosuvastatin  (CRESTOR ) 5 MG tablet Take 5 mg by mouth 3 (three) times a week. 11/21/20  Yes [provider]  finasteride (PROSCAR) 5 MG tablet Take 5 mg by mouth daily. Patient not taking: Reported on 12/06/2023    [provider]     Critical care time:   CRITICAL CARE Performed  by: Benton CORDOBA Harris   Total critical care time: 40 minutes  Critical care time was exclusive of separately billable procedures and treating other patients.  Critical care was necessary to treat or prevent imminent or life-threatening deterioration.  Critical care was time spent personally by me on the following activities: development of treatment plan with patient and/or surrogate as well as nursing, discussions with consultants, evaluation of patient's response to treatment, examination of patient, obtaining history from patient or surrogate, ordering and performing treatments and interventions, ordering and review of laboratory studies, ordering and review of radiographic studies, pulse oximetry and re-evaluation of patient's condition.  Whitney D. Arloa, NP-C [facility] Personal contact information can be found on Amion  If no contact or response made please call [phone] 12/06/2023, 6:12 PM  Attestation:  Mr. Ruz is a 78 y.o. male with medical history  significant of prostate cancer, history of radiation therapy, type 2 diabetes, mixed hyperlipidemia, hypertension, glaucoma who presented to the emergency department with complaints of epigastric abdominal pain found to have epigastric intramural hematoma beginning in the transverse aorta extending throughout thoracic and abdominal aorta requiring ICU level care for hemodynamic monitoring and strict BP and HR goals.  On examination, the patient is alert, oriented x 3. CN II-XII intact. Motor strength is 5/5 in upper and lower extremities. His sensation is intact in upper and lower extremities. Gait was not tested. I did not hear a carotid bruit bilaterally. Heart examination is relatively normal. No murmurs, rubs, or gallops. Lung is clear in all lung fields. Abdomen is soft, but tender to palpation in the epigastrium. He has no edema.  I reviewed his labs independently and notable things include K of 2.9, Cr 1.16, normal lipase, normal trops, normal white count  I reviewed imaging which is documented above in the one liner.  Assessment and Plan:  1- Large intramural hematoma of the aorta 2- Hypertensive Emergency  Recommendations: - Vascular surgery consult - Strict HR target between 60 - 80 and BP systolic between 8999 - 120 - Agree with esmolol  and diltiazem  drip as ordered by APP - May consider adding or replacing one of the drips above with Nicardipine  for better BP control. - Repeat CTA in 48 hours per vascular surgery  Due to a high probability of clinically significant, life threatening deterioration, the patient required my highest level of preparedness to intervene emergently and I personally spent this critical care time directly and personally managing the patient. This critical care time included obtaining a history; examining the patient; pulse oximetry; ordering and review of studies; arranging urgent treatment with development of a management plan; evaluation of patient's response  to treatment; frequent reassessment; and, discussions with other providers.  This critical care time was performed to assess and manage the high probability of imminent, life-threatening deterioration that could result in multi-organ failure. It was exclusive of separately billable procedures and treating other patients and teaching time. Critical Care Time: 30 minutes.  Paula Southerly, MD [facility]

## 2023-12-07 DIAGNOSIS — I10 Essential (primary) hypertension: Secondary | ICD-10-CM

## 2023-12-07 DIAGNOSIS — R110 Nausea: Secondary | ICD-10-CM

## 2023-12-07 DIAGNOSIS — I719 Aortic aneurysm of unspecified site, without rupture: Secondary | ICD-10-CM | POA: Diagnosis not present

## 2023-12-07 DIAGNOSIS — E785 Hyperlipidemia, unspecified: Secondary | ICD-10-CM | POA: Diagnosis not present

## 2023-12-07 LAB — GLUCOSE, CAPILLARY
Glucose-Capillary: 116 mg/dL — ABNORMAL HIGH (ref 70–99)
Glucose-Capillary: 151 mg/dL — ABNORMAL HIGH (ref 70–99)
Glucose-Capillary: 129 mg/dL — ABNORMAL HIGH (ref 70–99)

## 2023-12-07 LAB — CBC
HCT: 40.1 % (ref 39.0–52.0)
Hemoglobin: 13.2 g/dL (ref 13.0–17.0)
MCH: 27.2 pg (ref 26.0–34.0)
MCHC: 32.9 g/dL (ref 30.0–36.0)
MCV: 82.7 fL (ref 80.0–100.0)
Platelets: 182 K/uL (ref 150–400)
RBC: 4.85 MIL/uL (ref 4.22–5.81)
RDW: 14.5 % (ref 11.5–15.5)
WBC: 9.5 K/uL (ref 4.0–10.5)
nRBC: 0 % (ref 0.0–0.2)

## 2023-12-07 LAB — COMPREHENSIVE METABOLIC PANEL WITH GFR
ALT: 13 U/L (ref 0–44)
AST: 19 U/L (ref 15–41)
Albumin: 3.2 g/dL — ABNORMAL LOW (ref 3.5–5.0)
Alkaline Phosphatase: 30 U/L — ABNORMAL LOW (ref 38–126)
Anion gap: 11 (ref 5–15)
BUN: 22 mg/dL (ref 8–23)
CO2: 21 mmol/L — ABNORMAL LOW (ref 22–32)
Calcium: 8.6 mg/dL — ABNORMAL LOW (ref 8.9–10.3)
Chloride: 107 mmol/L (ref 98–111)
Creatinine, Ser: 1.26 mg/dL — ABNORMAL HIGH (ref 0.61–1.24)
GFR, Estimated: 59 mL/min — ABNORMAL LOW (ref >60)
Glucose, Bld: 115 mg/dL — ABNORMAL HIGH (ref 70–99)
Potassium: 4.1 mmol/L (ref 3.5–5.1)
Sodium: 139 mmol/L (ref 135–145)
Total Bilirubin: 1.1 mg/dL (ref 0.0–1.2)
Total Protein: 6.3 g/dL — ABNORMAL LOW (ref 6.5–8.1)

## 2023-12-07 LAB — HEMOGLOBIN A1C
Hgb A1c MFr Bld: 7.1 % — ABNORMAL HIGH (ref 4.8–5.6)
Mean Plasma Glucose: 157 mg/dL

## 2023-12-07 MED ORDER — OXYCODONE HCL 5 MG PO TABS
5.0000 mg | ORAL_TABLET | Freq: Four times a day (QID) | ORAL | Status: DC | PRN
  Administered 2023-12-09 (×2): 10 mg via ORAL
  Filled 2023-12-07 (×2): qty 2

## 2023-12-07 MED ORDER — CARVEDILOL 6.25 MG PO TABS
6.2500 mg | ORAL_TABLET | Freq: Two times a day (BID) | ORAL | Status: DC
  Administered 2023-12-07 (×2): 6.25 mg via ORAL
  Filled 2023-12-07: qty 1

## 2023-12-07 MED ORDER — CHLORHEXIDINE GLUCONATE CLOTH 2 % EX PADS
6.0000 | MEDICATED_PAD | Freq: Every day | CUTANEOUS | Status: DC
  Administered 2023-12-07 – 2024-01-01 (×28): 6 via TOPICAL

## 2023-12-07 MED ORDER — AMLODIPINE BESYLATE 10 MG PO TABS
10.0000 mg | ORAL_TABLET | Freq: Every day | ORAL | Status: DC
  Administered 2023-12-07 – 2023-12-10 (×6): 10 mg via ORAL
  Filled 2023-12-07 (×4): qty 1

## 2023-12-07 MED ORDER — CARVEDILOL 25 MG PO TABS
25.0000 mg | ORAL_TABLET | Freq: Two times a day (BID) | ORAL | Status: DC
  Administered 2023-12-07 – 2023-12-10 (×10): 25 mg via ORAL
  Filled 2023-12-07 (×7): qty 1

## 2023-12-07 MED ORDER — METOCLOPRAMIDE HCL 5 MG/ML IJ SOLN
5.0000 mg | Freq: Three times a day (TID) | INTRAMUSCULAR | Status: AC
  Administered 2023-12-07 – 2023-12-08 (×12): 5 mg via INTRAVENOUS
  Filled 2023-12-07 (×6): qty 2

## 2023-12-07 NOTE — Plan of Care (Signed)

## 2023-12-07 NOTE — Progress Notes (Signed)
 PCCM Brief Note   Remains on 200 Esmolol  and 3 Cardene  and SBP still 124 (goal < 120). Started on Carvedilol  6.25mg  BID and Amlodipine  10mg  daily today.  Given how high a dose Esmolol  he is on, will increase Carvedilol  to 25mg  BID. Continue Amlodipine . Might need to add a 3rd agent tomorrow if still on high amounts of continuous infusions.    Sammi Gore, PA - C [facility] For pager details, please see AMION or use Epic chat  After 1900, please call [facility] for cross coverage needs 12/07/2023, 3:15 PM

## 2023-12-07 NOTE — Progress Notes (Signed)
 NAME:  Nicholas Burke, MRN:  [id number], DOB:  [birth year], LOS: 1 ADMISSION DATE:  12/06/2023, CONSULTATION DATE:  12/06/2023 REFERRING MD: Dr. Celinda - [facility], CHIEF COMPLAINT: Intramural hematoma  History of Present Illness:  Nicholas Burke is a 78 year old male with a past medical history significant for prediabetes, hypertension, and prostate cancer who presented to the ED at [facility] 8/11 for complaints of epigastric pain.  On ED arrival patient was seen hypertensive with all other vital signs within normal limit.  CT abdomen pelvis obtained and revealed extensive intramural hematoma involving the descending thoracic aorta extending throughout the abdominal aorta and into the right external iliac artery.  CTA chest again confirms intramural hematoma beginning at the transverse aorta origin of the left subclavian artery and extending throughout the thoracic aorta and into the abdominal aorta.  Lab work significant for potassium 2.9.  Vascular surgery evaluated patient in ED and recommended tight blood pressure and heart rate control, PCCM consulted in this setting.   Pertinent  Medical History  prediabetes, hypertension, and prostate cancer  Significant Hospital Events: Including procedures, antibiotic start and stop dates in addition to other pertinent events   8/11 presented with epigastric abdominal pain found to have large intramural hematoma  Interim History / Subjective:  Pain improved, now around abdomen but still improved from prior. Esmolol  slowly coming down, not on PO meds yet.  Objective    Blood pressure 119/68, pulse 77, temperature 98.2 F (36.8 C), temperature source Oral, resp. rate 16, height 5' 10 (1.778 m), weight 90.7 kg, SpO2 96%.        Intake/Output Summary (Last 24 hours) at 12/07/2023 0803 Last data filed at 12/07/2023 0733 Gross per 24 hour  Intake 1551.51 ml  Output 400 ml  Net 1151.51 ml   Filed Weights   12/06/23 0958  Weight: 90.7 kg     Examination: General: Adult male, sitting up in bed, in NAD. Neuro: A&O x 3, no deficits. HEENT: Nicholas Burke/AT. Sclerae anicteric. EOMI. Cardiovascular: RRR, no M/R/G.  Lungs: Respirations even and unlabored.  CTA bilaterally, No W/R/R. Abdomen: BS x 4, soft, NT/ND.  Musculoskeletal: No gross deformities, no edema.   Assessment and Plan   Large intramural hematoma - CT abdomen pelvis obtained and revealed extensive intramural hematoma involving the descending thoracic aorta extending throughout the abdominal aorta and into the right external iliac artery.  CTA chest again confirms intramural hematoma beginning at the transverse aorta origin of the left subclavian artery and extending throughout the thoracic aorta and into the abdominal aorta.  P: Primary management per vascular SBP goal less than 120, Heart rate goal less than 60 Cardene  and esmolol  drips for above goals Start Amlodipine , Carvedilol  PRN Hydralazine , PRN Labetalol  Holding PTA Diltiazem  Continuous telemetry Morphine  PRN, Oxycodone  PRN for pain control Zofran  PRN nausea Repeat imaging per vascular  Essential hypertension Hyperlipidemia -Medications include Cardizem  and Crestor  P: See above, adding Amlodipine , Carvedilol  Holding PTA Diltiazem , can see how above measures do and reassess if d/c Dilt all together from [facility] Continuous telemetry   Type 2 diabetes P: SSI  CBG goal 140-180 CBG checks q4  History of prostate cancer P: Outpatient follow up    Best Practice (right click and Reselect all SmartList Selections daily)   Diet/type: Regular consistency (see orders) DVT prophylaxis SCD Pressure ulcer(s): N/A GI prophylaxis: N/A Lines: N/A Foley:  N/A Code Status:  full code Last date of multidisciplinary goals of care discussion: Pending   CC time: 30 min.  Nicholas Gore, PA - C [facility] For pager details, please see AMION or use Epic chat  After 1900, please  call [facility] for cross coverage needs 12/07/2023, 8:13 AM

## 2023-12-07 NOTE — Progress Notes (Addendum)
  Progress Note    12/07/2023 6:39 AM Hospital Day 1  Subjective:  denies chest or back pain.  Says he still has a tightness across is abdomen but has moved down in his abdomen.  Said trying to eat made him feel nauseous and feeling like he was going to throw up.   Afebrile 90's-120's systolic HR 70's-80's  94% 2LO2NC  Gtts: Esmolol  nicardipine   Vitals:   12/07/23 0530 12/07/23 0545  BP: 121/63 128/75  Pulse: 77 77  Resp: (!) 23 (!) 22  Temp:    SpO2: 93% 92%    Physical Exam: General:  no distress Lungs:  non labored Extremities:  palpable PT pulses bilaterally Abdomen is soft and non tender.  CBC    Component Value Date/Time   WBC 9.5 12/07/2023 0520   RBC 4.85 12/07/2023 0520   HGB 13.2 12/07/2023 0520   HCT 40.1 12/07/2023 0520   PLT 182 12/07/2023 0520   MCV 82.7 12/07/2023 0520   MCH 27.2 12/07/2023 0520   MCHC 32.9 12/07/2023 0520   RDW 14.5 12/07/2023 0520    BMET    Component Value Date/Time   NA 139 12/07/2023 0520   K 4.1 12/07/2023 0520   CL 107 12/07/2023 0520   CO2 21 (L) 12/07/2023 0520   GLUCOSE 115 (H) 12/07/2023 0520   BUN 22 12/07/2023 0520   CREATININE 1.26 (H) 12/07/2023 0520   CALCIUM  8.6 (L) 12/07/2023 0520   GFRNONAA 59 (L) 12/07/2023 0520   GFRAA >60 02/16/2019 0319    INR    Component Value Date/Time   INR 1.0 02/16/2019 0319     Intake/Output Summary (Last 24 hours) at 12/07/2023 9360 Last data filed at 12/07/2023 0631 Gross per 24 hour  Intake 1322.1 ml  Output 400 ml  Net 922.1 ml     Assessment/Plan:  78 y.o. male admitted with intramural hematoma of descending thoracic aorta  Hospital Day 1  -subjectively without chest or back pain.  Still having some tightness across abdomen but now lower in his abdomen and became nauseated with eating.  Most likely not related to intramural hematoma.  Dr. Lanis to see pt this morning and provide further recommendations.  -continue tight blood pressure control-still  requiring esmolol  and nicardipine .  -plan for repeat CTA probably tomorrow.   Lucie Apt, PA-C [facility] [phone] 12/07/2023 6:39 AM  VASCULAR STAFF ADDENDUM: I have independently interviewed and examined the patient. I agree with the above.  No abdominal pain on exam today.  Can transition IV medications to oral medications.  Plan for repeat CT tomorrow a.m.  This is ordered.  Fonda FORBES Lanis MD [facility] Phone Number: [phone] 12/07/2023 12:10 PM

## 2023-12-08 ENCOUNTER — Inpatient Hospital Stay (HOSPITAL_COMMUNITY)

## 2023-12-08 DIAGNOSIS — J9811 Atelectasis: Secondary | ICD-10-CM

## 2023-12-08 DIAGNOSIS — K8070 Calculus of gallbladder and bile duct without cholecystitis without obstruction: Secondary | ICD-10-CM | POA: Diagnosis not present

## 2023-12-08 DIAGNOSIS — N179 Acute kidney failure, unspecified: Secondary | ICD-10-CM

## 2023-12-08 DIAGNOSIS — I7100 Dissection of unspecified site of aorta: Secondary | ICD-10-CM | POA: Diagnosis not present

## 2023-12-08 DIAGNOSIS — R918 Other nonspecific abnormal finding of lung field: Secondary | ICD-10-CM | POA: Diagnosis not present

## 2023-12-08 DIAGNOSIS — E785 Hyperlipidemia, unspecified: Secondary | ICD-10-CM | POA: Diagnosis not present

## 2023-12-08 DIAGNOSIS — E119 Type 2 diabetes mellitus without complications: Secondary | ICD-10-CM | POA: Diagnosis not present

## 2023-12-08 DIAGNOSIS — I161 Hypertensive emergency: Secondary | ICD-10-CM | POA: Diagnosis not present

## 2023-12-08 DIAGNOSIS — I7123 Aneurysm of the descending thoracic aorta, without rupture: Secondary | ICD-10-CM | POA: Diagnosis not present

## 2023-12-08 LAB — MAGNESIUM: Magnesium: 2 mg/dL (ref 1.7–2.4)

## 2023-12-08 LAB — BASIC METABOLIC PANEL WITH GFR
Anion gap: 7 (ref 5–15)
BUN: 21 mg/dL (ref 8–23)
CO2: 20 mmol/L — ABNORMAL LOW (ref 22–32)
Calcium: 8.5 mg/dL — ABNORMAL LOW (ref 8.9–10.3)
Chloride: 111 mmol/L (ref 98–111)
Creatinine, Ser: 1.33 mg/dL — ABNORMAL HIGH (ref 0.61–1.24)
GFR, Estimated: 55 mL/min — ABNORMAL LOW (ref >60)
Glucose, Bld: 119 mg/dL — ABNORMAL HIGH (ref 70–99)
Potassium: 4.4 mmol/L (ref 3.5–5.1)
Sodium: 138 mmol/L (ref 135–145)

## 2023-12-08 LAB — CBC
HCT: 40.6 % (ref 39.0–52.0)
Hemoglobin: 13 g/dL (ref 13.0–17.0)
MCH: 26.6 pg (ref 26.0–34.0)
MCHC: 32 g/dL (ref 30.0–36.0)
MCV: 83.2 fL (ref 80.0–100.0)
Platelets: 158 K/uL (ref 150–400)
RBC: 4.88 MIL/uL (ref 4.22–5.81)
RDW: 14.3 % (ref 11.5–15.5)
WBC: 9.1 K/uL (ref 4.0–10.5)
nRBC: 0 % (ref 0.0–0.2)

## 2023-12-08 LAB — PHOSPHORUS: Phosphorus: 2.3 mg/dL — ABNORMAL LOW (ref 2.5–4.6)

## 2023-12-08 LAB — GLUCOSE, CAPILLARY: Glucose-Capillary: 139 mg/dL — ABNORMAL HIGH (ref 70–99)

## 2023-12-08 MED ORDER — DILTIAZEM HCL 30 MG PO TABS: 60.0000 mg | ORAL_TABLET | Freq: Four times a day (QID) | ORAL | Status: DC

## 2023-12-08 MED ORDER — DILTIAZEM HCL 30 MG PO TABS: 30.0000 mg | ORAL_TABLET | Freq: Four times a day (QID) | ORAL | Status: DC

## 2023-12-08 MED ORDER — ENSURE PLUS HIGH PROTEIN PO LIQD
237.0000 mL | Freq: Two times a day (BID) | ORAL | Status: DC
  Administered 2023-12-08 – 2023-12-09 (×6): 237 mL via ORAL

## 2023-12-08 MED ORDER — DILTIAZEM HCL 30 MG PO TABS
30.0000 mg | ORAL_TABLET | Freq: Once | ORAL | Status: AC
  Administered 2023-12-08 (×2): 30 mg via ORAL
  Filled 2023-12-08: qty 1

## 2023-12-08 MED ORDER — IOHEXOL 350 MG/ML SOLN
100.0000 mL | Freq: Once | INTRAVENOUS | Status: AC | PRN
  Administered 2023-12-08 (×2): 100 mL via INTRAVENOUS

## 2023-12-08 MED ORDER — DILTIAZEM HCL 30 MG PO TABS
30.0000 mg | ORAL_TABLET | Freq: Four times a day (QID) | ORAL | Status: DC
  Administered 2023-12-08 (×2): 30 mg via ORAL
  Filled 2023-12-08: qty 1

## 2023-12-08 MED ORDER — METOPROLOL TARTRATE 5 MG/5ML IV SOLN
5.0000 mg | INTRAVENOUS | Status: DC | PRN
  Administered 2023-12-08 (×4): 5 mg via INTRAVENOUS
  Filled 2023-12-08 (×2): qty 5

## 2023-12-08 MED ORDER — DILTIAZEM HCL 30 MG PO TABS
90.0000 mg | ORAL_TABLET | Freq: Four times a day (QID) | ORAL | Status: DC
  Administered 2023-12-08 – 2023-12-11 (×13): 90 mg via ORAL
  Filled 2023-12-08 (×11): qty 3

## 2023-12-08 NOTE — Progress Notes (Signed)
  Progress Note    12/08/2023 6:34 AM Hospital Day 2  Subjective:  denies any chest or back pain and abdominal pain is improved.   Tm 100.3 now afebrile HR 80's-90's  110's-130's systolic 95% 2LO2NC  Gtts:   Esmolol  nicardipine   Vitals:   12/08/23 0600 12/08/23 0615  BP: 112/64 122/68  Pulse: 80 79  Resp: (!) 26 (!) 24  Temp:    SpO2: 93% 95%    Physical Exam: General:  no distress Lungs:  non labored Extremities:  palpable bilateral radial and DP pulses  CBC    Component Value Date/Time   WBC 9.1 12/08/2023 0229   RBC 4.88 12/08/2023 0229   HGB 13.0 12/08/2023 0229   HCT 40.6 12/08/2023 0229   PLT 158 12/08/2023 0229   MCV 83.2 12/08/2023 0229   MCH 26.6 12/08/2023 0229   MCHC 32.0 12/08/2023 0229   RDW 14.3 12/08/2023 0229    BMET    Component Value Date/Time   NA 138 12/08/2023 0229   K 4.4 12/08/2023 0229   CL 111 12/08/2023 0229   CO2 20 (L) 12/08/2023 0229   GLUCOSE 119 (H) 12/08/2023 0229   BUN 21 12/08/2023 0229   CREATININE 1.33 (H) 12/08/2023 0229   CALCIUM  8.5 (L) 12/08/2023 0229   GFRNONAA 55 (L) 12/08/2023 0229   GFRAA >60 02/16/2019 0319    INR    Component Value Date/Time   INR 1.0 02/16/2019 0319     Intake/Output Summary (Last 24 hours) at 12/08/2023 0634 Last data filed at 12/08/2023 0630 Gross per 24 hour  Intake 2358.39 ml  Output 625 ml  Net 1733.39 ml   CTA c/a/p 12/08/2023: IMPRESSION: 1. Intramural hematoma involving the thoracic aorta, extending into the abdominal aorta and right inflow vessels, with associated aneurysmal dilatation of the proximal descending thoracic aorta measuring 4.5 cm (previously 4.3 cm). 2. Unchanged narrowing of the IMA ostium secondary to intramural hematoma, non-flow limiting. 3. New small left pleural effusion with left lower lobe atelectasis and consolidation. There is also new partial atelectasis of the Lingula and right lower lobe. 4. Stones within the neck of the gallbladder,  measuring up to 7 mm, without gallbladder wall thickening or surrounding inflammation. 5. Moderate-sized fat-containing right inguinal hernia with tenting of the right lateral urinary bladder dome into the hernia sac.  Assessment/Plan:  78 y.o. male  admitted with intramural hematoma of descending thoracic aorta   Hospital Day 2  -pt denies chest or back pain and abdominal pain improved.  He continues to have palpable distal pulses.  -Dr. Lanis to review CTA. -continue medical therapy with good BP and HR control -hgb stable at 13 down just slightly from yesterday at 13.2   Lucie Apt, PA-C [facility] [phone] 12/08/2023 6:34 AM

## 2023-12-08 NOTE — Progress Notes (Signed)
  Progress Note    12/08/2023 11:57 AM Hospital Day 2  Subjective:  denies any chest or back pain and abdominal pain is improved.   Tm 100.3 now afebrile HR 80's-90's  110's-130's systolic 95% 2LO2NC  Gtts:   Esmolol  nicardipine   Vitals:   12/08/23 1130 12/08/23 1145  BP: 119/68 119/72  Pulse: 75 75  Resp: 20 20  Temp:    SpO2: 93% 95%    Physical Exam: General:  no distress Lungs:  non labored Extremities:  palpable bilateral radial and DP pulses  CBC    Component Value Date/Time   WBC 9.1 12/08/2023 0229   RBC 4.88 12/08/2023 0229   HGB 13.0 12/08/2023 0229   HCT 40.6 12/08/2023 0229   PLT 158 12/08/2023 0229   MCV 83.2 12/08/2023 0229   MCH 26.6 12/08/2023 0229   MCHC 32.0 12/08/2023 0229   RDW 14.3 12/08/2023 0229    BMET    Component Value Date/Time   NA 138 12/08/2023 0229   K 4.4 12/08/2023 0229   CL 111 12/08/2023 0229   CO2 20 (L) 12/08/2023 0229   GLUCOSE 119 (H) 12/08/2023 0229   BUN 21 12/08/2023 0229   CREATININE 1.33 (H) 12/08/2023 0229   CALCIUM  8.5 (L) 12/08/2023 0229   GFRNONAA 55 (L) 12/08/2023 0229   GFRAA >60 02/16/2019 0319    INR    Component Value Date/Time   INR 1.0 02/16/2019 0319     Intake/Output Summary (Last 24 hours) at 12/08/2023 1157 Last data filed at 12/08/2023 1100 Gross per 24 hour  Intake 2421.34 ml  Output 750 ml  Net 1671.34 ml   CTA c/a/p 12/08/2023: IMPRESSION: 1. Intramural hematoma involving the thoracic aorta, extending into the abdominal aorta and right inflow vessels, with associated aneurysmal dilatation of the proximal descending thoracic aorta measuring 4.5 cm (previously 4.3 cm). 2. Unchanged narrowing of the IMA ostium secondary to intramural hematoma, non-flow limiting. 3. New small left pleural effusion with left lower lobe atelectasis and consolidation. There is also new partial atelectasis of the Lingula and right lower lobe. 4. Stones within the neck of the gallbladder, measuring up to  7 mm, without gallbladder wall thickening or surrounding inflammation. 5. Moderate-sized fat-containing right inguinal hernia with tenting of the right lateral urinary bladder dome into the hernia sac.  Assessment/Plan:  78 y.o. male  admitted with intramural hematoma of descending thoracic aorta   Hospital Day 2  -pt denies chest or back pain and abdominal pain improved.  He continues to have palpable distal pulses.  -Dr. Lanis to review CTA. -continue medical therapy with good BP and HR control -hgb stable at 13 down just slightly from yesterday at 13.2   Lucie Apt, PA-C [facility] [phone] 12/08/2023 11:57 AM   VASCULAR STAFF ADDENDUM: I have independently interviewed and examined the patient. I agree with the above.  Follow-up CT angio demonstrates no change.  Possibly a very small increase in the thoracic aortic diameter.  However this is minimal. He is asymptomatic.  I do not think that rushing to any repair would be in his best interest as it comes with significant morbidity and mortality. Plan to continue current medical therapy.  Up out of bed, ambulate, continue antiimpulse control transitioning to oral medications Plan for 1 month rescan.    Fonda FORBES Lanis MD [facility] Phone Number: [phone] 12/08/2023 11:57 AM

## 2023-12-08 NOTE — Progress Notes (Signed)
   NAME:  Gregrey Bloyd, MRN:  [id number], DOB:  29-Apr-1945, LOS: 2 ADMISSION DATE:  12/06/2023, CONSULTATION DATE:  12/06/2023 REFERRING MD: Dr. Celinda - TRH, CHIEF COMPLAINT: Intramural hematoma  History of Present Illness:  Aimee Timmons is a 78 year old male with a past medical history significant for prediabetes, hypertension, and prostate cancer who presented to the ED at [facility] 8/11 for complaints of epigastric pain.  On ED arrival patient was seen hypertensive with all other vital signs within normal limit.  CT abdomen pelvis obtained and revealed extensive intramural hematoma involving the descending thoracic aorta extending throughout the abdominal aorta and into the right external iliac artery.  CTA chest again confirms intramural hematoma beginning at the transverse aorta origin of the left subclavian artery and extending throughout the thoracic aorta and into the abdominal aorta.  Lab work significant for potassium 2.9.  Vascular surgery evaluated patient in ED and recommended tight blood pressure and heart rate control, PCCM consulted in this setting.   Pertinent  Medical History  prediabetes, hypertension, and prostate cancer  Significant Hospital Events: Including procedures, antibiotic start and stop dates in addition to other pertinent events   8/11 presented with epigastric abdominal pain found to have large intramural hematoma  Interim History / Subjective:  Almost off cleviprex . Still on fair bit of esmolol . Abd pain improved Ate some breakfast No new symptoms  Objective    Blood pressure 117/66, pulse 76, temperature 99.2 F (37.3 C), temperature source Oral, resp. rate (!) 21, height 5' 10 (1.778 m), weight 90.7 kg, SpO2 94%.        Intake/Output Summary (Last 24 hours) at 12/08/2023 9075 Last data filed at 12/08/2023 0900 Gross per 24 hour  Intake 2554.49 ml  Output 750 ml  Net 1804.49 ml   Filed Weights   12/06/23 0958  Weight: 90.7 kg    Examination: No  distress Lungs/heart sound fine Nonlabored breathing pattern Ext warm Aox3 RASS 0 Nonfocal neuro  CTA reviewed  Assessment and Plan   Type B aortic dissection with intramural hematoma- related to longstanding HTN, some growth by radiologist read but will see what VVS thinks. Hypertensive emergency HLD DM2- low A1c, fingersticks have been fine, supposed to stop checking yesterday Hx prostate cancer- in remission local radiation treatment AKI-  mild, keep an eye on with repeated contrast loads; renal arteries are patent on CTA and kidneys enhance fine Atelectasis on CTA L>R- some related to periaortic inflammation and physical encroachment on left airways; rest related to mobility  - Coreg , amlodipine  max'd; nicardipine  now off, goal SBP < 130, ideally < 120 - Chronotropy still an issue: add dilt, titrate up to try to get off esmolol  - Await VVS review of CTA - Want him to mobilize and try to eat more - Adding ensure  31 min cc time Rolan Sharps MD PCCM

## 2023-12-08 NOTE — Plan of Care (Signed)
  Problem: Education: Goal: Knowledge of General Education information will improve Description: Including pain rating scale, medication(s)/side effects and non-pharmacologic comfort measures Outcome: Progressing   Problem: Health Behavior/Discharge Planning: Goal: Ability to manage health-related needs will improve Outcome: Progressing   Problem: Clinical Measurements: Goal: Ability to maintain clinical measurements within normal limits will improve Outcome: Progressing Goal: Will remain free from infection Outcome: Progressing Goal: Respiratory complications will improve Outcome: Progressing Goal: Cardiovascular complication will be avoided Outcome: Progressing   Problem: Safety: Goal: Ability to remain free from injury will improve Outcome: Progressing

## 2023-12-08 NOTE — TOC Initial Note (Signed)
 Transition of Care [facility]) - Initial/Assessment Note    Patient Details  Name: Nicholas Burke MRN: [id number] Date of Birth: 20-Jan-1946  Transition of Care [facility] 2) CM/SW Contact:    Sudie Erminio Deems, RN Phone Number: 12/08/2023, 3:01 PM  Clinical Narrative:  Patient presented for abdominal pain. PTA patient states he was from home with spouse. Spouse was at the bedside during the visit. Patient states he does not use any DME in the home. Patient has PCP and transportation to appointments. No home needs identified during this visit. Case Manager will continue to follow for additional needs.    Expected Discharge Plan: Home/Self Care Barriers to Discharge: No Barriers Identified   Patient Goals and CMS Choice Patient states their goals for this hospitalization and ongoing recovery are:: plan to return home once stable.   Choice offered to / list presented to : NA      Expected Discharge Plan and Services In-house Referral: NA Discharge Planning Services: CM Consult Post Acute Care Choice: NA Living arrangements for the past 2 months: Single Family Home                   DME Agency: NA  Prior Living Arrangements/Services Living arrangements for the past 2 months: Single Family Home Lives with:: Spouse Patient language and need for interpreter reviewed:: Yes Do you feel safe going back to the place where you live?: Yes      Need for Family Participation in Patient Care: No (Comment) Care giver support system in place?: No (comment)   Criminal Activity/Legal Involvement Pertinent to Current Situation/Hospitalization: No - Comment as needed  Activities of Daily Living   ADL Screening (condition at time of admission) Independently performs ADLs?: Yes (appropriate for developmental age) Is the patient deaf or have difficulty hearing?: No Does the patient have difficulty seeing, even when wearing glasses/contacts?: No Does the patient have difficulty concentrating,  remembering, or making decisions?: No  Permission Sought/Granted Permission sought to share information with : Family Supports, Case Manager                Emotional Assessment Appearance:: Appears stated age Attitude/Demeanor/Rapport: Engaged Affect (typically observed): Appropriate Orientation: : Oriented to Self, Oriented to Place, Oriented to  Time, Oriented to Situation Alcohol / Substance Use: Not Applicable Psych Involvement: No (comment)  Admission diagnosis:  Aortic dissection (HCC) [I71.00] Intramural aortic hematoma (HCC) [I71.00] Patient Active Problem List   Diagnosis Date Noted   Intramural aortic hematoma (HCC) 12/06/2023   Essential hypertension 12/06/2023   Disorder associated with type 2 diabetes mellitus (HCC) 12/06/2023   Glaucoma 12/06/2023   Type 2 diabetes with complication (HCC) 12/06/2023   Mixed hyperlipidemia 12/06/2023   History of malignant neoplasm of prostate 12/06/2023   Aortic dissection (HCC) 12/06/2023   Special screening for malignant neoplasms, colon 10/18/2014   Hx of radiation therapy    Prostate cancer (HCC) 11/03/2012   PCP:  Hugh Charleston, MD (Inactive) Pharmacy:   [id number] - [place], [place] - [address] AT [facility] [address]  [place] [address] Phone: [phone] Fax: [phone]     Social Drivers of Health (SDOH) Social History: SDOH Screenings   Food Insecurity: No Food Insecurity (12/06/2023)  Housing: Low Risk  (12/06/2023)  Transportation Needs: No Transportation Needs (12/06/2023)  Utilities: Not At Risk (12/06/2023)  Social Connections: Socially Integrated (12/06/2023)  Tobacco Use: Low Risk  (12/06/2023)   SDOH Interventions:     Readmission Risk Interventions  No data to display

## 2023-12-09 ENCOUNTER — Inpatient Hospital Stay (HOSPITAL_COMMUNITY)

## 2023-12-09 DIAGNOSIS — I161 Hypertensive emergency: Secondary | ICD-10-CM | POA: Diagnosis not present

## 2023-12-09 DIAGNOSIS — I7100 Dissection of unspecified site of aorta: Secondary | ICD-10-CM | POA: Diagnosis not present

## 2023-12-09 DIAGNOSIS — N179 Acute kidney failure, unspecified: Secondary | ICD-10-CM | POA: Diagnosis not present

## 2023-12-09 LAB — BASIC METABOLIC PANEL WITH GFR
Anion gap: 8 (ref 5–15)
BUN: 28 mg/dL — ABNORMAL HIGH (ref 8–23)
CO2: 17 mmol/L — ABNORMAL LOW (ref 22–32)
Calcium: 8.9 mg/dL (ref 8.9–10.3)
Chloride: 109 mmol/L (ref 98–111)
Creatinine, Ser: 1.52 mg/dL — ABNORMAL HIGH (ref 0.61–1.24)
GFR, Estimated: 47 mL/min — ABNORMAL LOW (ref >60)
Glucose, Bld: 130 mg/dL — ABNORMAL HIGH (ref 70–99)
Potassium: 4.4 mmol/L (ref 3.5–5.1)
Sodium: 134 mmol/L — ABNORMAL LOW (ref 135–145)

## 2023-12-09 LAB — CBC
HCT: 41.2 % (ref 39.0–52.0)
Hemoglobin: 13.2 g/dL (ref 13.0–17.0)
MCH: 26.8 pg (ref 26.0–34.0)
MCHC: 32 g/dL (ref 30.0–36.0)
MCV: 83.6 fL (ref 80.0–100.0)
Platelets: 178 K/uL (ref 150–400)
RBC: 4.93 MIL/uL (ref 4.22–5.81)
RDW: 14.2 % (ref 11.5–15.5)
WBC: 11.1 K/uL — ABNORMAL HIGH (ref 4.0–10.5)
nRBC: 0 % (ref 0.0–0.2)

## 2023-12-09 LAB — LACTIC ACID, PLASMA: Lactic Acid, Venous: 1.3 mmol/L (ref 0.5–1.9)

## 2023-12-09 LAB — MAGNESIUM: Magnesium: 2.2 mg/dL (ref 1.7–2.4)

## 2023-12-09 LAB — PHOSPHORUS: Phosphorus: 3.2 mg/dL (ref 2.5–4.6)

## 2023-12-09 MED ORDER — BISACODYL 10 MG RE SUPP
10.0000 mg | Freq: Every day | RECTAL | Status: DC | PRN
  Administered 2023-12-09 – 2023-12-11 (×2): 10 mg via RECTAL
  Filled 2023-12-09 (×2): qty 1

## 2023-12-09 MED ORDER — HYDRALAZINE HCL 20 MG/ML IJ SOLN
10.0000 mg | Freq: Once | INTRAMUSCULAR | Status: AC | PRN
  Administered 2023-12-09: 10 mg via INTRAVENOUS
  Filled 2023-12-09: qty 1

## 2023-12-09 MED ORDER — METOPROLOL TARTRATE 5 MG/5ML IV SOLN
5.0000 mg | INTRAVENOUS | Status: DC | PRN
  Administered 2023-12-09 – 2023-12-10 (×2): 5 mg via INTRAVENOUS
  Filled 2023-12-09 (×3): qty 5

## 2023-12-09 MED ORDER — HYDRALAZINE HCL 50 MG PO TABS
50.0000 mg | ORAL_TABLET | Freq: Three times a day (TID) | ORAL | Status: DC
  Administered 2023-12-09 (×3): 50 mg via ORAL
  Filled 2023-12-09 (×3): qty 1

## 2023-12-09 MED ORDER — SENNA 8.6 MG PO TABS
2.0000 | ORAL_TABLET | Freq: Every day | ORAL | Status: DC
  Administered 2023-12-09 – 2023-12-10 (×2): 17.2 mg via ORAL
  Filled 2023-12-09 (×2): qty 2

## 2023-12-09 MED ORDER — POLYETHYLENE GLYCOL 3350 17 G PO PACK
17.0000 g | PACK | Freq: Two times a day (BID) | ORAL | Status: DC
  Administered 2023-12-09: 17 g via ORAL
  Filled 2023-12-09 (×3): qty 1

## 2023-12-09 MED ORDER — HYDRALAZINE HCL 20 MG/ML IJ SOLN
10.0000 mg | INTRAMUSCULAR | Status: DC | PRN
  Administered 2023-12-09 – 2023-12-10 (×2): 10 mg via INTRAVENOUS
  Filled 2023-12-09 (×2): qty 1

## 2023-12-09 NOTE — Progress Notes (Signed)
 eLink Physician-Brief Progress Note Patient Name: Nicholas Burke DOB: 05/25/1945 MRN: [id number]   Date of Service  12/09/2023  HPI/Events of Note  Unable to tolerate miralax  for constipation. Constipation 2/2 narcotic regimen and ileus  Continues to have abd pain/distension Started on bowel regimen today  eICU Interventions  Soap suds enema ordered KUB in am NPO overnight     Intervention Category Minor Interventions: Routine modifications to care plan (e.g. PRN medications for pain, fever)  Nicholas Burke Staff 12/09/2023, 11:13 PM

## 2023-12-09 NOTE — Progress Notes (Addendum)
  Progress Note    12/09/2023 6:40 AM Hospital Day 3  Subjective:  denies any chest, back pain.  One bout of abdominal pain overnight but improved from admission.  He is passing flatus but no BM.   Tm 99.9 now afebrile HR 60's-70's  110's-130's systolic 92% RA  Gtts: esmolol .  Off nicardipine   Vitals:   12/09/23 0600 12/09/23 0615  BP: 119/65 127/79  Pulse: 70 69  Resp: (!) 22 (!) 23  Temp:    SpO2: 91% 92%    Physical Exam: General:  no distress; resting comfortably Lungs:  non labored Extremities:  palpable DP pulses bilaterally Abdomen: distended but soft and NT to palpation; + flatus; -BM  CBC    Component Value Date/Time   WBC 11.1 (H) 12/09/2023 0246   RBC 4.93 12/09/2023 0246   HGB 13.2 12/09/2023 0246   HCT 41.2 12/09/2023 0246   PLT 178 12/09/2023 0246   MCV 83.6 12/09/2023 0246   MCH 26.8 12/09/2023 0246   MCHC 32.0 12/09/2023 0246   RDW 14.2 12/09/2023 0246    BMET    Component Value Date/Time   NA 134 (L) 12/09/2023 0246   K 4.4 12/09/2023 0246   CL 109 12/09/2023 0246   CO2 17 (L) 12/09/2023 0246   GLUCOSE 130 (H) 12/09/2023 0246   BUN 28 (H) 12/09/2023 0246   CREATININE 1.52 (H) 12/09/2023 0246   CALCIUM  8.9 12/09/2023 0246   GFRNONAA 47 (L) 12/09/2023 0246   GFRAA >60 02/16/2019 0319    INR    Component Value Date/Time   INR 1.0 02/16/2019 0319     Intake/Output Summary (Last 24 hours) at 12/09/2023 0640 Last data filed at 12/09/2023 0600 Gross per 24 hour  Intake 1658.22 ml  Output 795 ml  Net 863.22 ml     Assessment/Plan:  78 y.o. male admitted with intramural hematoma of descending thoracic aorta   Hospital Day 3  -subjectively continues without back or chest pain.  One bout with abdominal pain overnight but still improved from admission.  Has not had BM and is passing flatus.  Abdomen distended but soft and non tender to palpation. -still requiring esmolol  with good BP and HR control -hgb stable at 13.2 -per Dr.  Lanis note yesterday, possibly  a very small increase in the thoracic aortic diameter.  However this is minimal  He did not think that rushing to any repair would be in his best interest as it comes with significant morbidity and mortality.  Plan to continue medical therapy.    Lucie Apt, PA-C [facility] [phone] 12/09/2023 6:40 AM  VASCULAR STAFF ADDENDUM: I agree with the above.  Recommend aggressive bowel regimen.  Continue transition to oral antihypertensives.    Fonda FORBES Lanis MD [facility] Phone Number: [phone] 12/09/2023 8:21 AM

## 2023-12-09 NOTE — Progress Notes (Signed)
 NAME:  Raeqwon Lux, MRN:  [id number], DOB:  1945/07/06, LOS: 3 ADMISSION DATE:  12/06/2023, CONSULTATION DATE:  12/06/2023 REFERRING MD: Dr. Celinda - TRH, CHIEF COMPLAINT: Intramural hematoma  History of Present Illness:  Shawn Carattini is a 78 year old male with a past medical history significant for prediabetes, hypertension, and prostate cancer who presented to the ED at [facility] 8/11 for complaints of epigastric pain.  On ED arrival patient was seen hypertensive with all other vital signs within normal limit.  CT abdomen pelvis obtained and revealed extensive intramural hematoma involving the descending thoracic aorta extending throughout the abdominal aorta and into the right external iliac artery.  CTA chest again confirms intramural hematoma beginning at the transverse aorta origin of the left subclavian artery and extending throughout the thoracic aorta and into the abdominal aorta.  Lab work significant for potassium 2.9.  Vascular surgery evaluated patient in ED and recommended tight blood pressure and heart rate control, PCCM consulted in this setting.   Pertinent  Medical History  prediabetes, hypertension, and prostate cancer  Significant Hospital Events: Including procedures, antibiotic start and stop dates in addition to other pertinent events   8/11 presented with epigastric abdominal pain found to have large intramural hematoma 8/14 remains on esmolol , no indication for urgent surgical intervention  Interim History / Subjective:  Off clev but remains on of esmolol  No BM since before admission and feels fairly distended, noted increased generalized abdominal pain over the morning   Objective    Blood pressure 136/82, pulse 69, temperature 97.9 F (36.6 C), temperature source Oral, resp. rate (!) 24, height 5' 10 (1.778 m), weight 94.7 kg, SpO2 92%.        Intake/Output Summary (Last 24 hours) at 12/09/2023 0853 Last data filed at 12/09/2023 0700 Gross per 24 hour   Intake 1451.91 ml  Output 795 ml  Net 656.91 ml   Filed Weights   12/06/23 0958 12/09/23 0500  Weight: 90.7 kg 94.7 kg   General:  well nourished elderly M resting in bed in NAD HEENT: MM pink/moist Neuro: alert and oriented, no sensation deficit or weakness in the LE CV: s1s2 rrr, no m/r/g PULM:  clear bilaterally on RA GI: soft, minimal BS, distended and mildly TTP in the mid-abdominal region Extremities: warm/dry, no edema, warm with palpable peripheral pulses    Labs  Hgb stable  Creatinine worse from 1.3 to 1.5 795cc UOP yesterday 0.3cc/kg  Assessment and Plan   Type B aortic dissection with intramural hematoma- related to longstanding HTN, some growth but vascular feels no urgent indication for operative intervention Hypertensive emergency HLD DM2- low A1c, fingersticks have been fine Hx prostate cancer- in remission local radiation treatment AKI-  received repeated contrast loads; renal arteries are patent on CTA and kidneys enhance fine Atelectasis on CTA L>R- some related to periaortic inflammation and physical encroachment on left airways; rest related to mobility  - Coreg , amlodipine  max'd; nicardipine  now off, goal SBP < 130, ideally < 120 -still on esmolol  , this ran out and HR remained in the 60's but systolic immediately rose, will start scheduled hydralazine  50mg  q8hrs -check Echo  - start bowel reg and check KUB in the setting of abd pain this AM, continue oxy for pain, if does not improve will contact vascular and consider repeat CTA - continue to encourage mobilization and ensure -worsening renal function today, still making urine but at 0.3cc/kg/hr, monitor UOP  and electrolytes  CRITICAL CARE Performed by: Leita SAUNDERS Ervin Rothbauer  Total critical care time: 35  minutes  Critical care time was exclusive of separately billable procedures and treating other patients.  Critical care was necessary to treat or prevent imminent or life-threatening  deterioration.  Critical care was time spent personally by me on the following activities: development of treatment plan with patient and/or surrogate as well as nursing, discussions with consultants, evaluation of patient's response to treatment, examination of patient, obtaining history from patient or surrogate, ordering and performing treatments and interventions, ordering and review of laboratory studies, ordering and review of radiographic studies, pulse oximetry and re-evaluation of patient's condition.   Leita SAUNDERS Chaye Misch, PA-C [facility] See Amion for pager If no response to pager , please call [phone] until 7pm After 7:00 pm call Elink  [phone]

## 2023-12-10 ENCOUNTER — Inpatient Hospital Stay (HOSPITAL_COMMUNITY)

## 2023-12-10 DIAGNOSIS — I10 Essential (primary) hypertension: Secondary | ICD-10-CM

## 2023-12-10 DIAGNOSIS — R140 Abdominal distension (gaseous): Secondary | ICD-10-CM | POA: Diagnosis not present

## 2023-12-10 DIAGNOSIS — K567 Ileus, unspecified: Secondary | ICD-10-CM | POA: Diagnosis not present

## 2023-12-10 DIAGNOSIS — I7103 Dissection of thoracoabdominal aorta: Secondary | ICD-10-CM | POA: Diagnosis not present

## 2023-12-10 DIAGNOSIS — I7123 Aneurysm of the descending thoracic aorta, without rupture: Secondary | ICD-10-CM

## 2023-12-10 DIAGNOSIS — I7100 Dissection of unspecified site of aorta: Secondary | ICD-10-CM | POA: Diagnosis not present

## 2023-12-10 LAB — BASIC METABOLIC PANEL WITH GFR
Anion gap: 14 (ref 5–15)
BUN: 49 mg/dL — ABNORMAL HIGH (ref 8–23)
CO2: 19 mmol/L — ABNORMAL LOW (ref 22–32)
Calcium: 9.5 mg/dL (ref 8.9–10.3)
Chloride: 103 mmol/L (ref 98–111)
Creatinine, Ser: 1.68 mg/dL — ABNORMAL HIGH (ref 0.61–1.24)
GFR, Estimated: 42 mL/min — ABNORMAL LOW (ref >60)
Glucose, Bld: 153 mg/dL — ABNORMAL HIGH (ref 70–99)
Potassium: 4.4 mmol/L (ref 3.5–5.1)
Sodium: 136 mmol/L (ref 135–145)

## 2023-12-10 LAB — CBC
HCT: 45.7 % (ref 39.0–52.0)
Hemoglobin: 15.5 g/dL (ref 13.0–17.0)
MCH: 27.1 pg (ref 26.0–34.0)
MCHC: 33.9 g/dL (ref 30.0–36.0)
MCV: 79.9 fL — ABNORMAL LOW (ref 80.0–100.0)
Platelets: 290 K/uL (ref 150–400)
RBC: 5.72 MIL/uL (ref 4.22–5.81)
RDW: 14.2 % (ref 11.5–15.5)
WBC: 4.7 K/uL (ref 4.0–10.5)
nRBC: 0 % (ref 0.0–0.2)

## 2023-12-10 LAB — ECHOCARDIOGRAM COMPLETE
AR max vel: 3.79 cm2
AV Area VTI: 3.67 cm2
AV Area mean vel: 3.67 cm2
AV Mean grad: 1.5 mmHg
AV Peak grad: 3.3 mmHg
Ao pk vel: 0.91 m/s
Area-P 1/2: 3.12 cm2
Height: 70 in
S' Lateral: 3.45 cm
Weight: 3368.63 [oz_av]

## 2023-12-10 LAB — PHOSPHORUS: Phosphorus: 5.5 mg/dL — ABNORMAL HIGH (ref 2.5–4.6)

## 2023-12-10 LAB — MAGNESIUM: Magnesium: 2.6 mg/dL — ABNORMAL HIGH (ref 1.7–2.4)

## 2023-12-10 MED ORDER — METOCLOPRAMIDE HCL 5 MG PO TABS
5.0000 mg | ORAL_TABLET | Freq: Three times a day (TID) | ORAL | Status: DC
  Administered 2023-12-10: 5 mg via ORAL
  Filled 2023-12-10 (×2): qty 1

## 2023-12-10 MED ORDER — CLONIDINE HCL 0.1 MG PO TABS
0.2000 mg | ORAL_TABLET | Freq: Three times a day (TID) | ORAL | Status: DC
  Administered 2023-12-10 (×3): 0.2 mg via ORAL
  Filled 2023-12-10 (×3): qty 2

## 2023-12-10 MED ORDER — FUROSEMIDE 10 MG/ML IJ SOLN
40.0000 mg | Freq: Two times a day (BID) | INTRAMUSCULAR | Status: DC
  Administered 2023-12-10 – 2023-12-11 (×2): 40 mg via INTRAVENOUS
  Filled 2023-12-10 (×2): qty 4

## 2023-12-10 MED ORDER — SMOG ENEMA
960.0000 mL | Freq: Once | RECTAL | Status: AC
  Administered 2023-12-10: 960 mL via RECTAL
  Filled 2023-12-10: qty 960

## 2023-12-10 MED ORDER — ALBUMIN HUMAN 25 % IV SOLN
12.5000 g | Freq: Once | INTRAVENOUS | Status: AC
  Administered 2023-12-10: 12.5 g via INTRAVENOUS
  Filled 2023-12-10: qty 50

## 2023-12-10 MED ORDER — CLEVIDIPINE BUTYRATE 0.5 MG/ML IV EMUL
0.0000 mg/h | INTRAVENOUS | Status: DC
  Administered 2023-12-10: 8 mg/h via INTRAVENOUS
  Administered 2023-12-10: 2 mg/h via INTRAVENOUS
  Administered 2023-12-11: 4 mg/h via INTRAVENOUS
  Administered 2023-12-11 (×2): 6 mg/h via INTRAVENOUS
  Administered 2023-12-13: 4 mg/h via INTRAVENOUS
  Administered 2023-12-13: 2 mg/h via INTRAVENOUS
  Administered 2023-12-14: 4 mg/h via INTRAVENOUS
  Filled 2023-12-10 (×8): qty 100

## 2023-12-10 MED ORDER — HYDRALAZINE HCL 50 MG PO TABS
100.0000 mg | ORAL_TABLET | Freq: Three times a day (TID) | ORAL | Status: DC
  Administered 2023-12-10 – 2023-12-11 (×4): 100 mg via ORAL
  Filled 2023-12-10 (×4): qty 2

## 2023-12-10 MED ORDER — LABETALOL HCL 5 MG/ML IV SOLN
10.0000 mg | INTRAVENOUS | Status: DC | PRN
  Administered 2023-12-10 – 2023-12-15 (×4): 10 mg via INTRAVENOUS
  Filled 2023-12-10 (×4): qty 4

## 2023-12-10 MED ORDER — SORBITOL 70 % SOLN
30.0000 mL | Freq: Once | Status: AC
  Administered 2023-12-10: 30 mL via ORAL
  Filled 2023-12-10: qty 30

## 2023-12-10 MED ORDER — METOCLOPRAMIDE HCL 5 MG/ML IJ SOLN
5.0000 mg | Freq: Three times a day (TID) | INTRAMUSCULAR | Status: AC
  Administered 2023-12-10 – 2023-12-12 (×8): 5 mg via INTRAVENOUS
  Filled 2023-12-10 (×8): qty 2

## 2023-12-10 MED ORDER — LACTATED RINGERS IV SOLN: INTRAVENOUS | Status: DC

## 2023-12-10 MED ORDER — METHYLNALTREXONE BROMIDE 12 MG/0.6ML ~~LOC~~ SOLN
6.0000 mg | Freq: Once | SUBCUTANEOUS | Status: AC
  Administered 2023-12-10: 6 mg via SUBCUTANEOUS
  Filled 2023-12-10: qty 0.6

## 2023-12-10 MED ORDER — METOPROLOL TARTRATE 5 MG/5ML IV SOLN
5.0000 mg | Freq: Once | INTRAVENOUS | Status: AC
  Administered 2023-12-10: 5 mg via INTRAVENOUS

## 2023-12-10 MED ORDER — NEOSTIGMINE METHYLSULFATE 10 MG/10ML IV SOLN
0.2500 mg | Freq: Four times a day (QID) | INTRAVENOUS | Status: AC
  Administered 2023-12-10 – 2023-12-12 (×8): 0.25 mg via SUBCUTANEOUS
  Filled 2023-12-10 (×8): qty 0.25

## 2023-12-10 NOTE — Progress Notes (Signed)
*  PRELIMINARY RESULTS* Echocardiogram 2D Echocardiogram has been performed.  Benard FORBES Stallion 12/10/2023, 11:16 AM

## 2023-12-10 NOTE — Progress Notes (Signed)
  Progress Note    12/10/2023 3:45 PM Hospital Day 3  Subjective:  denies any chest, back pain.  Wants to have a bowel movement.   Vitals:   12/10/23 1500 12/10/23 1515  BP:  127/72  Pulse: 65 76  Resp: (!) 25 (!) 35  Temp:    SpO2: 92% 92%    Physical Exam: General:  no distress; resting comfortably Lungs:  non labored Extremities:  palpable DP pulses bilaterally Abdomen: distended but soft and NT to palpation; + flatus; -BM  CBC    Component Value Date/Time   WBC 4.7 12/10/2023 0926   RBC 5.72 12/10/2023 0926   HGB 15.5 12/10/2023 0926   HCT 45.7 12/10/2023 0926   PLT 290 12/10/2023 0926   MCV 79.9 (L) 12/10/2023 0926   MCH 27.1 12/10/2023 0926   MCHC 33.9 12/10/2023 0926   RDW 14.2 12/10/2023 0926    BMET    Component Value Date/Time   NA 136 12/10/2023 0926   K 4.4 12/10/2023 0926   CL 103 12/10/2023 0926   CO2 19 (L) 12/10/2023 0926   GLUCOSE 153 (H) 12/10/2023 0926   BUN 49 (H) 12/10/2023 0926   CREATININE 1.68 (H) 12/10/2023 0926   CALCIUM  9.5 12/10/2023 0926   GFRNONAA 42 (L) 12/10/2023 0926   GFRAA >60 02/16/2019 0319    INR    Component Value Date/Time   INR 1.0 02/16/2019 0319     Intake/Output Summary (Last 24 hours) at 12/10/2023 1545 Last data filed at 12/10/2023 1500 Gross per 24 hour  Intake 477.63 ml  Output 800 ml  Net -322.37 ml     Assessment/Plan:  78 y.o. male admitted with intramural hematoma of descending thoracic aorta   LOS 4 days   Subjectively continues without back or chest pain.    Has not had BM and is passing flatus.  Abdomen distended but soft and non tender to palpation. Wean gtts as able  Plan to continue medical therapy with repeat imaging in one month     [name] [facility] Phone Number: [phone] 12/10/2023 3:45 PM

## 2023-12-10 NOTE — Progress Notes (Signed)
   NAME:  Nicholas Burke, MRN:  [id number], DOB:  June 18, 1945, LOS: 4 ADMISSION DATE:  12/06/2023, CONSULTATION DATE:  12/06/2023 REFERRING MD: Dr. Celinda - [facility], CHIEF COMPLAINT: Intramural hematoma  History of Present Illness:  Guenther Dunshee is a 78 year old male with a past medical history significant for prediabetes, hypertension, and prostate cancer who presented to the ED at [facility] 8/11 for complaints of epigastric pain.  On ED arrival patient was seen hypertensive with all other vital signs within normal limit.  CT abdomen pelvis obtained and revealed extensive intramural hematoma involving the descending thoracic aorta extending throughout the abdominal aorta and into the right external iliac artery.  CTA chest again confirms intramural hematoma beginning at the transverse aorta origin of the left subclavian artery and extending throughout the thoracic aorta and into the abdominal aorta.  Lab work significant for potassium 2.9.  Vascular surgery evaluated patient in ED and recommended tight blood pressure and heart rate control, PCCM consulted in this setting.   Pertinent  Medical History  prediabetes, hypertension, and prostate cancer  Significant Hospital Events: Including procedures, antibiotic start and stop dates in addition to other pertinent events   8/11 presented with epigastric abdominal pain found to have large intramural hematoma 8/14 remains on esmolol , no indication for urgent surgical intervention  Interim History / Subjective:  Still constipated.  Objective    Blood pressure (!) 167/94, pulse 73, temperature 97.8 F (36.6 C), temperature source Oral, resp. rate (!) 25, height 5' 10 (1.778 m), weight 95.5 kg, SpO2 92%.        Intake/Output Summary (Last 24 hours) at 12/10/2023 1056 Last data filed at 12/10/2023 1000 Gross per 24 hour  Intake 285.43 ml  Output 700 ml  Net -414.57 ml   Filed Weights   12/06/23 0958 12/09/23 0500 12/10/23 0500  Weight: 90.7 kg 94.7 kg  95.5 kg   No distress Abd remains distended, hypoactive BS, tympanic Nonlabored breathing RASS 0 Minimal edema Ext warm Moves to command  Cr drifting up  Assessment and Plan   Type B aortic dissection with intramural hematoma- related to longstanding HTN, some growth but vascular feels no urgent indication for operative intervention Hypertensive emergency HLD DM2- low A1c, fingersticks have been fine Hx prostate cancer- in remission local radiation treatment AKI-  received repeated contrast loads; renal arteries are patent on CTA and kidneys enhance fine Atelectasis on CTA L>R- some related to periaortic inflammation and physical encroachment on left airways; rest related to mobility Ileus  Added clonidine  Unfortunately will have to add back cleviprex  IV hydration Strengthening bowel regimen again: think this is driving ongoing BP issues Try to mobilize more Remains in ICU while we get BP under better control  34 min cc time Rolan Sharps MD PCCM

## 2023-12-10 NOTE — Plan of Care (Signed)
  Problem: Education: Goal: Ability to describe self-care measures that may prevent or decrease complications (Diabetes Survival Skills Education) will improve Outcome: Progressing Goal: Individualized Educational Video(s) Outcome: Progressing   Problem: Coping: Goal: Ability to adjust to condition or change in health will improve Outcome: Progressing   Problem: Nutritional: Goal: Maintenance of adequate nutrition will improve Outcome: Progressing Goal: Progress toward achieving an optimal weight will improve Outcome: Progressing

## 2023-12-11 ENCOUNTER — Inpatient Hospital Stay (HOSPITAL_COMMUNITY)

## 2023-12-11 DIAGNOSIS — I7100 Dissection of unspecified site of aorta: Secondary | ICD-10-CM | POA: Diagnosis not present

## 2023-12-11 DIAGNOSIS — K567 Ileus, unspecified: Secondary | ICD-10-CM | POA: Diagnosis not present

## 2023-12-11 LAB — BASIC METABOLIC PANEL WITH GFR
Anion gap: 15 (ref 5–15)
BUN: 71 mg/dL — ABNORMAL HIGH (ref 8–23)
CO2: 22 mmol/L (ref 22–32)
Calcium: 8.9 mg/dL (ref 8.9–10.3)
Chloride: 99 mmol/L (ref 98–111)
Creatinine, Ser: 2.91 mg/dL — ABNORMAL HIGH (ref 0.61–1.24)
GFR, Estimated: 22 mL/min — ABNORMAL LOW (ref >60)
Glucose, Bld: 137 mg/dL — ABNORMAL HIGH (ref 70–99)
Potassium: 3.9 mmol/L (ref 3.5–5.1)
Sodium: 136 mmol/L (ref 135–145)

## 2023-12-11 LAB — CBC
HCT: 40.1 % (ref 39.0–52.0)
Hemoglobin: 13.5 g/dL (ref 13.0–17.0)
MCH: 26.9 pg (ref 26.0–34.0)
MCHC: 33.7 g/dL (ref 30.0–36.0)
MCV: 79.9 fL — ABNORMAL LOW (ref 80.0–100.0)
Platelets: 270 K/uL (ref 150–400)
RBC: 5.02 MIL/uL (ref 4.22–5.81)
RDW: 14.5 % (ref 11.5–15.5)
WBC: 4.3 K/uL (ref 4.0–10.5)
nRBC: 0 % (ref 0.0–0.2)

## 2023-12-11 LAB — MAGNESIUM: Magnesium: 2.5 mg/dL — ABNORMAL HIGH (ref 1.7–2.4)

## 2023-12-11 LAB — PHOSPHORUS: Phosphorus: 5.8 mg/dL — ABNORMAL HIGH (ref 2.5–4.6)

## 2023-12-11 MED ORDER — ESMOLOL HCL-SODIUM CHLORIDE 2000 MG/100ML IV SOLN
25.0000 ug/kg/min | INTRAVENOUS | Status: DC
  Administered 2023-12-11: 100 ug/kg/min via INTRAVENOUS
  Administered 2023-12-11 (×2): 25 ug/kg/min via INTRAVENOUS
  Administered 2023-12-12 (×2): 50 ug/kg/min via INTRAVENOUS
  Administered 2023-12-13 (×5): 75 ug/kg/min via INTRAVENOUS
  Administered 2023-12-14: 225 ug/kg/min via INTRAVENOUS
  Administered 2023-12-14 (×2): 200 ug/kg/min via INTRAVENOUS
  Administered 2023-12-14: 175 ug/kg/min via INTRAVENOUS
  Administered 2023-12-14: 200 ug/kg/min via INTRAVENOUS
  Administered 2023-12-14: 250 ug/kg/min via INTRAVENOUS
  Administered 2023-12-14: 75 ug/kg/min via INTRAVENOUS
  Administered 2023-12-14: 149.854 ug/kg/min via INTRAVENOUS
  Administered 2023-12-14: 75 ug/kg/min via INTRAVENOUS
  Administered 2023-12-14: 225 ug/kg/min via INTRAVENOUS
  Administered 2023-12-15: 300 ug/kg/min via INTRAVENOUS
  Administered 2023-12-15: 250 ug/kg/min via INTRAVENOUS
  Administered 2023-12-15: 150 ug/kg/min via INTRAVENOUS
  Administered 2023-12-15: 225 ug/kg/min via INTRAVENOUS
  Administered 2023-12-15 (×3): 275 ug/kg/min via INTRAVENOUS
  Administered 2023-12-15 (×2): 250 ug/kg/min via INTRAVENOUS
  Administered 2023-12-15: 125 ug/kg/min via INTRAVENOUS
  Administered 2023-12-15: 200 ug/kg/min via INTRAVENOUS
  Administered 2023-12-15: 275 ug/kg/min via INTRAVENOUS
  Administered 2023-12-15: 200 ug/kg/min via INTRAVENOUS
  Administered 2023-12-15: 250 ug/kg/min via INTRAVENOUS
  Administered 2023-12-16 (×2): 300 ug/kg/min via INTRAVENOUS
  Administered 2023-12-16: 275 ug/kg/min via INTRAVENOUS
  Administered 2023-12-16 (×2): 300 ug/kg/min via INTRAVENOUS
  Administered 2023-12-16: 175 ug/kg/min via INTRAVENOUS
  Administered 2023-12-16: 300 ug/kg/min via INTRAVENOUS
  Administered 2023-12-16: 250 ug/kg/min via INTRAVENOUS
  Administered 2023-12-16: 300 ug/kg/min via INTRAVENOUS
  Filled 2023-12-11: qty 100
  Filled 2023-12-11: qty 200
  Filled 2023-12-11: qty 100
  Filled 2023-12-11: qty 200
  Filled 2023-12-11: qty 100
  Filled 2023-12-11: qty 400
  Filled 2023-12-11: qty 100
  Filled 2023-12-11 (×2): qty 200
  Filled 2023-12-11 (×2): qty 100
  Filled 2023-12-11: qty 200
  Filled 2023-12-11 (×2): qty 100
  Filled 2023-12-11: qty 300
  Filled 2023-12-11: qty 100
  Filled 2023-12-11 (×3): qty 200
  Filled 2023-12-11: qty 100
  Filled 2023-12-11: qty 400
  Filled 2023-12-11: qty 100
  Filled 2023-12-11: qty 200
  Filled 2023-12-11: qty 400
  Filled 2023-12-11: qty 100

## 2023-12-11 MED ORDER — LACTATED RINGERS IV SOLN: INTRAVENOUS | Status: DC

## 2023-12-11 MED ORDER — LACTATED RINGERS IV BOLUS
500.0000 mL | Freq: Once | INTRAVENOUS | Status: AC
  Administered 2023-12-11: 500 mL via INTRAVENOUS

## 2023-12-11 MED ORDER — LACTULOSE ENEMA
300.0000 mL | Freq: Once | ORAL | Status: AC
  Administered 2023-12-11: 300 mL via RECTAL
  Filled 2023-12-11: qty 300

## 2023-12-11 NOTE — Progress Notes (Signed)
   NAME:  Nicholas Burke, MRN:  [id number], DOB:  1945-06-08, LOS: 5 ADMISSION DATE:  12/06/2023, CONSULTATION DATE:  12/06/2023 REFERRING MD: Dr. Celinda - [facility], CHIEF COMPLAINT: Intramural hematoma  History of Present Illness:  Nicholas Burke is a 78 year old male with a past medical history significant for prediabetes, hypertension, and prostate cancer who presented to the ED at [facility] 8/11 for complaints of epigastric pain.  On ED arrival patient was seen hypertensive with all other vital signs within normal limit.  CT abdomen pelvis obtained and revealed extensive intramural hematoma involving the descending thoracic aorta extending throughout the abdominal aorta and into the right external iliac artery.  CTA chest again confirms intramural hematoma beginning at the transverse aorta origin of the left subclavian artery and extending throughout the thoracic aorta and into the abdominal aorta.  Lab work significant for potassium 2.9.  Vascular surgery evaluated patient in ED and recommended tight blood pressure and heart rate control, PCCM consulted in this setting.   Pertinent  Medical History  prediabetes, hypertension, and prostate cancer  Significant Hospital Events: Including procedures, antibiotic start and stop dates in addition to other pertinent events   8/11 presented with epigastric abdominal pain found to have large intramural hematoma 8/14 remains on esmolol , no indication for urgent surgical intervention 8/15 ongoing issues with ileus 8/16 NGT placed to decompress  Interim History / Subjective:  NGT placed with improvement in stomach distension; 4500 out ogt (!!)  Objective    Blood pressure 113/65, pulse 93, temperature 98.5 F (36.9 C), temperature source Axillary, resp. rate (!) 25, height 5' 10 (1.778 m), weight 91.2 kg, SpO2 94%.        Intake/Output Summary (Last 24 hours) at 12/11/2023 1117 Last data filed at 12/11/2023 0800 Gross per 24 hour  Intake 2333.43 ml  Output  5915 ml  Net -3581.57 ml   Filed Weights   12/09/23 0500 12/10/23 0500 12/11/23 0645  Weight: 94.7 kg 95.5 kg 91.2 kg   No distress Abd softer but still no BS Moves to command RASS 0 Distal pulses intact, ext warm Heart sounds regular  Cr drifting up  Assessment and Plan   Type B aortic dissection with intramural hematoma- related to longstanding HTN, minimal growth on repeat imaging.  Trying to manage conservatively as location of this is in tough spot for TEVAR Hypertensive emergency HLD DM2- low A1c, fingersticks have been fine; CBG on BMP okay so holding off scheduled fingersticks Hx prostate cancer- in remission local radiation treatment AKI-  received repeated contrast loads; renal arteries are patent on CTA and kidneys enhance fine; see discussion below Atelectasis on CTA L>R- some related to periaortic inflammation and physical encroachment on left airways; rest related to mobility, ileus Ileus- biggest issue remaining  Now understandable why we cannot wean gtt: gut not absorbing at all NGT to LIS Cleviprex  for SBP < 140 Esmolol  for HR < 85 DC orals for now Neostigmine  + reglan  + enemas + mobility, received relistor  8/15 without effect Start IVF as he put out as much from his OGT as he got all admission, suspect may actually be volume down If still no movement Monday will need to do CT and consider TPN Patient and family updated  34 min cc time Rolan Sharps MD PCCM

## 2023-12-11 NOTE — Progress Notes (Signed)
 eLink Physician-Brief Progress Note Patient Name: Nicholas Burke DOB: [birth year] MRN: [id number]   Date of Service  12/11/2023  HPI/Events of Note  Increasing work of breathing with significant abd distention.  eICU Interventions  Place NG to suction in attempt to decompress     Intervention Category Intermediate Interventions: Respiratory distress - evaluation and management  CLAUDENE AGENT, P 12/11/2023, 12:26 AM

## 2023-12-12 ENCOUNTER — Inpatient Hospital Stay (HOSPITAL_COMMUNITY)

## 2023-12-12 DIAGNOSIS — I7100 Dissection of unspecified site of aorta: Secondary | ICD-10-CM | POA: Diagnosis not present

## 2023-12-12 DIAGNOSIS — C61 Malignant neoplasm of prostate: Secondary | ICD-10-CM

## 2023-12-12 DIAGNOSIS — E785 Hyperlipidemia, unspecified: Secondary | ICD-10-CM | POA: Diagnosis not present

## 2023-12-12 DIAGNOSIS — I161 Hypertensive emergency: Secondary | ICD-10-CM | POA: Diagnosis not present

## 2023-12-12 DIAGNOSIS — E119 Type 2 diabetes mellitus without complications: Secondary | ICD-10-CM | POA: Diagnosis not present

## 2023-12-12 LAB — CBC
HCT: 42.6 % (ref 39.0–52.0)
Hemoglobin: 14.5 g/dL (ref 13.0–17.0)
MCH: 27.1 pg (ref 26.0–34.0)
MCHC: 34 g/dL (ref 30.0–36.0)
MCV: 79.5 fL — ABNORMAL LOW (ref 80.0–100.0)
Platelets: 307 K/uL (ref 150–400)
RBC: 5.36 MIL/uL (ref 4.22–5.81)
RDW: 14.4 % (ref 11.5–15.5)
WBC: 8 K/uL (ref 4.0–10.5)
nRBC: 0 % (ref 0.0–0.2)

## 2023-12-12 LAB — BASIC METABOLIC PANEL WITH GFR
Anion gap: 18 — ABNORMAL HIGH (ref 5–15)
BUN: 94 mg/dL — ABNORMAL HIGH (ref 8–23)
CO2: 30 mmol/L (ref 22–32)
Calcium: 8.6 mg/dL — ABNORMAL LOW (ref 8.9–10.3)
Chloride: 94 mmol/L — ABNORMAL LOW (ref 98–111)
Creatinine, Ser: 3.96 mg/dL — ABNORMAL HIGH (ref 0.61–1.24)
GFR, Estimated: 15 mL/min — ABNORMAL LOW (ref >60)
Glucose, Bld: 131 mg/dL — ABNORMAL HIGH (ref 70–99)
Potassium: 3.6 mmol/L (ref 3.5–5.1)
Sodium: 142 mmol/L (ref 135–145)

## 2023-12-12 LAB — PHOSPHORUS: Phosphorus: 7.8 mg/dL — ABNORMAL HIGH (ref 2.5–4.6)

## 2023-12-12 LAB — LACTIC ACID, PLASMA: Lactic Acid, Venous: 2.5 mmol/L (ref 0.5–1.9)

## 2023-12-12 LAB — TRIGLYCERIDES: Triglycerides: 172 mg/dL — ABNORMAL HIGH (ref <150)

## 2023-12-12 LAB — MAGNESIUM: Magnesium: 2.6 mg/dL — ABNORMAL HIGH (ref 1.7–2.4)

## 2023-12-12 MED ORDER — LACTATED RINGERS IV BOLUS
2000.0000 mL | Freq: Once | INTRAVENOUS | Status: AC
  Administered 2023-12-12: 2000 mL via INTRAVENOUS

## 2023-12-12 MED ORDER — POTASSIUM CHLORIDE 10 MEQ/100ML IV SOLN
10.0000 meq | INTRAVENOUS | Status: AC
  Administered 2023-12-12 (×3): 10 meq via INTRAVENOUS
  Filled 2023-12-12 (×3): qty 100

## 2023-12-12 MED ORDER — LACTATED RINGERS IV BOLUS
1000.0000 mL | Freq: Once | INTRAVENOUS | Status: AC
  Administered 2023-12-12: 1000 mL via INTRAVENOUS

## 2023-12-12 MED ORDER — FLEET ENEMA RE ENEM
1.0000 | ENEMA | Freq: Once | RECTAL | Status: AC
  Administered 2023-12-12: 1 via RECTAL
  Filled 2023-12-12: qty 1

## 2023-12-12 NOTE — Progress Notes (Signed)
 NAME:  Nicholas Burke, MRN:  [id number], DOB:  [birth year], LOS: 6 ADMISSION DATE:  12/06/2023, CONSULTATION DATE:  12/06/2023 REFERRING MD: Dr. Celinda - [facility], CHIEF COMPLAINT: Intramural hematoma  History of Present Illness:  Nicholas Burke is a 78 year old male with a past medical history significant for prediabetes, hypertension, and prostate cancer who presented to the ED at [facility] 8/11 for complaints of epigastric pain.  On ED arrival patient was seen hypertensive with all other vital signs within normal limit.  CT abdomen pelvis obtained and revealed extensive intramural hematoma involving the descending thoracic aorta extending throughout the abdominal aorta and into the right external iliac artery.  CTA chest again confirms intramural hematoma beginning at the transverse aorta origin of the left subclavian artery and extending throughout the thoracic aorta and into the abdominal aorta.  Lab work significant for potassium 2.9.  Vascular surgery evaluated patient in ED and recommended tight blood pressure and heart rate control, PCCM consulted in this setting.   Pertinent  Medical History  prediabetes, hypertension, and prostate cancer  Significant Hospital Events: Including procedures, antibiotic start and stop dates in addition to other pertinent events   8/11 presented with epigastric abdominal pain found to have large intramural hematoma 8/14 remains on esmolol , no indication for urgent surgical intervention 8/15 ongoing issues with ileus 8/16 NGT placed to decompress  Interim History / Subjective:  Patient stated abdominal pain is better, after NG tube was placed, he has large volume output  Remains on esmolol  remain on esmolol  Afebrile No bowel movements but he stated passing some flatus  Objective    Blood pressure 123/80, pulse 89, temperature 98.5 F (36.9 C), temperature source Oral, resp. rate (!) 23, height 5' 10 (1.778 m), weight 91.2 kg, SpO2 95%.        Intake/Output  Summary (Last 24 hours) at 12/12/2023 1059 Last data filed at 12/12/2023 1000 Gross per 24 hour  Intake 2129.28 ml  Output 5875 ml  Net -3745.72 ml   Filed Weights   12/09/23 0500 12/10/23 0500 12/11/23 0645  Weight: 94.7 kg 95.5 kg 91.2 kg   Physical exam: General: Elderly male lying on recliner  HEENT: [place]/AT, eyes anicteric.  moist mucus membranes NGT in place draining bilious fluid Neuro: Alert, awake following commands Chest: Coarse breath sounds, no wheezes or rhonchi Heart: Regular rate and rhythm, no murmurs or gallops Abdomen: Soft, distended very sluggish bowel sounds present   Labs and images reviewed  Patient Lines/Drains/Airways Status     Active Line/Drains/Airways     Name Placement date Placement time Site Days   Peripheral IV 12/09/23 22 G 1 Anterior;Right Forearm 12/09/23  1743  Forearm  3   Peripheral IV 12/06/23 Anterior;Right Hand 12/06/23  --  Hand  6   NG/OG Vented/Dual Lumen Left nare External length of tube 58 cm 12/11/23  0045  Left nare  1         Assessment and Plan  Type B aortic dissection with intramural hematoma, likely related to longstanding hypertension Hypertensive emergency HLD DM2, well-controlled Severe ileus Hx prostate cancer- in remission local radiation treatment AKI, prerenal versus intrarenal due to contrast induced Hyperphosphatemia  Patient is unable to take oral meds due to severe ileus Continue esmolol  infusion Clevidipine  infusion has been titrated off Maintain SBP less than 120 and heart rate less than 80 Vascular surgery is following, trying to manage conservatively Blood pressure is controlled now Hemoglobin A1c 7.1 Monitor fingerstick goal 140-180 Patient continued to have severe  ileus, NGT was placed yesterday with 4.9 L output Continue IV fluid to match NG tube output Will get KUB again today Will give him a Fleet enema Has received neostigmine , Relistor , Reglan  and multiple enemas in last few days Monitor  intake and output Serum creatinine is trending up likely due to dehydration and contrast-induced nephropathy His serum potassium is slightly low, will give him potassium to keep around 4 His bicarbonate is going up which suggest he is volume down May need renal consult, will hold off for now Closely monitor electrolytes  The patient is critically ill due to type B aortic dissection/hypertensive emergency requiring titration of IV medications.  Severe ileus.  Critical care was necessary to treat or prevent imminent or life-threatening deterioration.  Critical care was time spent personally by me on the following activities: development of treatment plan with patient and/or surrogate as well as nursing, discussions with consultants, evaluation of patient's response to treatment, examination of patient, obtaining history from patient or surrogate, ordering and performing treatments and interventions, ordering and review of laboratory studies, ordering and review of radiographic studies, pulse oximetry, re-evaluation of patient's condition and participation in multidisciplinary rounds.   During this encounter critical care time was devoted to patient care services described in this note for 37 minutes.     Valinda Novas, MD [facility] See Amion for pager If no response to pager, please call [phone] until 7pm After 7pm, Please call E-link [phone]

## 2023-12-13 ENCOUNTER — Inpatient Hospital Stay (HOSPITAL_COMMUNITY)

## 2023-12-13 DIAGNOSIS — I7100 Dissection of unspecified site of aorta: Secondary | ICD-10-CM | POA: Diagnosis not present

## 2023-12-13 DIAGNOSIS — J9601 Acute respiratory failure with hypoxia: Secondary | ICD-10-CM

## 2023-12-13 DIAGNOSIS — K567 Ileus, unspecified: Secondary | ICD-10-CM | POA: Diagnosis not present

## 2023-12-13 DIAGNOSIS — E8729 Other acidosis: Secondary | ICD-10-CM

## 2023-12-13 DIAGNOSIS — I161 Hypertensive emergency: Secondary | ICD-10-CM | POA: Diagnosis not present

## 2023-12-13 LAB — RENAL FUNCTION PANEL
Albumin: 2.4 g/dL — ABNORMAL LOW (ref 3.5–5.0)
Anion gap: 18 — ABNORMAL HIGH (ref 5–15)
BUN: 104 mg/dL — ABNORMAL HIGH (ref 8–23)
CO2: 35 mmol/L — ABNORMAL HIGH (ref 22–32)
Calcium: 8.1 mg/dL — ABNORMAL LOW (ref 8.9–10.3)
Chloride: 89 mmol/L — ABNORMAL LOW (ref 98–111)
Creatinine, Ser: 3.06 mg/dL — ABNORMAL HIGH (ref 0.61–1.24)
GFR, Estimated: 20 mL/min — ABNORMAL LOW (ref >60)
Glucose, Bld: 130 mg/dL — ABNORMAL HIGH (ref 70–99)
Phosphorus: 7.1 mg/dL — ABNORMAL HIGH (ref 2.5–4.6)
Potassium: 3.3 mmol/L — ABNORMAL LOW (ref 3.5–5.1)
Sodium: 142 mmol/L (ref 135–145)

## 2023-12-13 LAB — BASIC METABOLIC PANEL WITH GFR
Anion gap: 16 — ABNORMAL HIGH (ref 5–15)
BUN: 90 mg/dL — ABNORMAL HIGH (ref 8–23)
CO2: 37 mmol/L — ABNORMAL HIGH (ref 22–32)
Calcium: 8.2 mg/dL — ABNORMAL LOW (ref 8.9–10.3)
Chloride: 93 mmol/L — ABNORMAL LOW (ref 98–111)
Creatinine, Ser: 2.28 mg/dL — ABNORMAL HIGH (ref 0.61–1.24)
GFR, Estimated: 29 mL/min — ABNORMAL LOW (ref >60)
Glucose, Bld: 121 mg/dL — ABNORMAL HIGH (ref 70–99)
Potassium: 3.5 mmol/L (ref 3.5–5.1)
Sodium: 146 mmol/L — ABNORMAL HIGH (ref 135–145)

## 2023-12-13 LAB — SODIUM, URINE, RANDOM: Sodium, Ur: 19 mmol/L

## 2023-12-13 LAB — URINALYSIS, ROUTINE W REFLEX MICROSCOPIC
Bilirubin Urine: NEGATIVE
Glucose, UA: NEGATIVE mg/dL
Hgb urine dipstick: NEGATIVE
Ketones, ur: NEGATIVE mg/dL
Leukocytes,Ua: NEGATIVE
Nitrite: NEGATIVE
Protein, ur: NEGATIVE mg/dL
Specific Gravity, Urine: 1.017 (ref 1.005–1.030)
pH: 6 (ref 5.0–8.0)

## 2023-12-13 LAB — CBC
HCT: 44.1 % (ref 39.0–52.0)
Hemoglobin: 14.5 g/dL (ref 13.0–17.0)
MCH: 27 pg (ref 26.0–34.0)
MCHC: 32.9 g/dL (ref 30.0–36.0)
MCV: 82.1 fL (ref 80.0–100.0)
Platelets: 335 K/uL (ref 150–400)
RBC: 5.37 MIL/uL (ref 4.22–5.81)
RDW: 14.5 % (ref 11.5–15.5)
WBC: 10.9 K/uL — ABNORMAL HIGH (ref 4.0–10.5)
nRBC: 0 % (ref 0.0–0.2)

## 2023-12-13 LAB — LACTIC ACID, PLASMA: Lactic Acid, Venous: 2.1 mmol/L (ref 0.5–1.9)

## 2023-12-13 MED ORDER — POTASSIUM CHLORIDE 10 MEQ/100ML IV SOLN
10.0000 meq | INTRAVENOUS | Status: AC
  Administered 2023-12-13 (×2): 10 meq via INTRAVENOUS
  Filled 2023-12-13 (×2): qty 100

## 2023-12-13 MED ORDER — PANCRELIPASE (LIP-PROT-AMYL) 10440-39150 UNITS PO TABS
20880.0000 [IU] | ORAL_TABLET | Freq: Once | ORAL | Status: DC
  Filled 2023-12-13: qty 2

## 2023-12-13 MED ORDER — SODIUM BICARBONATE 650 MG PO TABS: 650.0000 mg | ORAL_TABLET | Freq: Once | ORAL | Status: DC

## 2023-12-13 MED ORDER — NEOSTIGMINE METHYLSULFATE 10 MG/10ML IV SOLN
0.2500 mg | Freq: Four times a day (QID) | INTRAVENOUS | Status: DC
  Administered 2023-12-13 – 2023-12-17 (×16): 0.25 mg via SUBCUTANEOUS
  Filled 2023-12-13 (×19): qty 0.25

## 2023-12-13 MED ORDER — LACTATED RINGERS IV BOLUS
1000.0000 mL | Freq: Once | INTRAVENOUS | Status: AC
  Administered 2023-12-13: 1000 mL via INTRAVENOUS

## 2023-12-13 NOTE — Progress Notes (Signed)
 Called to update daughter Meade at her request for an update. Her sister was updated at bedside this morning and the patient's wife has been updated this morning by the nurses. Meade is going to send her sister a list of questions she would like answers to. One of her questions was when we would be calling GI about his nonresolving ileus. We discussed that we have used multiple meds so far, but we can call them today.  Leita SHAUNNA Gaskins, DO 12/13/23 12:46 PM [facility]  For contact information, see Amion. If no response to pager, please call PCCM consult pager. After hours, 7PM- 7AM, please call Elink.

## 2023-12-13 NOTE — Progress Notes (Signed)
 NAME:  Nicholas Burke, MRN:  [id number], DOB:  09-19-1945, LOS: 7 ADMISSION DATE:  12/06/2023, CONSULTATION DATE:  12/06/2023 REFERRING MD: Dr. Celinda - [facility], CHIEF COMPLAINT: Intramural hematoma  History of Present Illness:  Nicholas Burke is a 78 year old male with a past medical history significant for prediabetes, hypertension, and prostate cancer who presented to the ED at [facility] 8/11 for complaints of epigastric pain.  On ED arrival patient was seen hypertensive with all other vital signs within normal limit.  CT abdomen pelvis obtained and revealed extensive intramural hematoma involving the descending thoracic aorta extending throughout the abdominal aorta and into the right external iliac artery.  CTA chest again confirms intramural hematoma beginning at the transverse aorta origin of the left subclavian artery and extending throughout the thoracic aorta and into the abdominal aorta.  Lab work significant for potassium 2.9.  Vascular surgery evaluated patient in ED and recommended tight blood pressure and heart rate control, PCCM consulted in this setting.   Pertinent  Medical History  prediabetes, hypertension, and prostate cancer  Significant Hospital Events: Including procedures, antibiotic start and stop dates in addition to other pertinent events   8/11 presented with epigastric abdominal pain found to have large intramural hematoma 8/14 remains on esmolol , no indication for urgent surgical intervention 8/15 ongoing issues with ileus 8/16 NGT placed to decompress  Interim History / Subjective:  Feels about the same. No NGT output overnight despite flushing. Increased oxygen requirements overnight. Afebrile. One BM after enema yesterday.   Objective    Blood pressure 126/79, pulse 84, temperature 97.7 F (36.5 C), temperature source Axillary, resp. rate (!) 22, height 5' 10 (1.778 m), weight 91.2 kg, SpO2 (!) 89%.        Intake/Output Summary (Last 24 hours) at 12/13/2023  0728 Last data filed at 12/13/2023 0600 Gross per 24 hour  Intake 5447.25 ml  Output 2350 ml  Net 3097.25 ml   Filed Weights   12/09/23 0500 12/10/23 0500 12/11/23 0645  Weight: 94.7 kg 95.5 kg 91.2 kg   Physical exam: General: ill appearing man lying in bed in NAD HEENT: Nicholas Burke/AT, eyes anicteric Neuro: Awake, alert, answering questions appropriately. Moving extremities.  Chest: faint rhales on the left anteriorly, cleared with coughing. No basilar  Heart: S1S2, RRR Abdomen: Very distended, tympanitic to percussion. Absent bowel sounds. Large amount of bilious output after NGT pushed.  NG output: 1.7L; none overnight.   K+ 3.3 BUN 104 Cr 3.06 Phos 7.1 WBC 10.9 H/H 14.5/44.1 Platelets 335 KUB: continued gastric and bowel distention   Assessment and Plan  Type B aortic dissection with intramural hematoma, likely related to longstanding hypertension Hypertensive emergency -con't esmolol  & cleviprex  for HR goal <60, SBP >120 -appreciate VVS's management  Severe ileus due to dissection -NGT flushed, back to suction this morning due to severe distention -will need to consider TPN soon -con't daily neostigmine  -ambulate  Acute respiratory failure with hypoxia; suspect he has been aspirating from nonfunctioning NGT with rising oxygen requirements rapidly overnight -decompress stomach -supplemental O2 to maintain SpO2 > 90% -ambulate  Hypokalemia -repleted, monitor -give additional 2g repletion with high gastric output still  HLD -eventually needs statin; on hold now while NPO  DM2, well-controlled -monitor; not needing insulin   Hx prostate cancer; in remission; had local radiation treatment  AKI, prerenal versus intrarenal due to contrast induced -maintain euvolemia -resume IVF; were held initially due ot low NGT output and rising oxygen requirements -consult nephrology  Hyperphosphatemia -monitor  Lactic acidosis  on 8/17 -recheck this morning  Nicholas Burke and  Nicholas Burke were updated at bedside during rounds.   This patient is critically ill with multiple organ system failure which requires frequent high complexity decision making, assessment, support, evaluation, and titration of therapies. This was completed through the application of advanced monitoring technologies and extensive interpretation of multiple databases. During this encounter critical care time was devoted to patient care services described in this note for 40 minutes.   Leita SHAUNNA Gaskins, DO 12/13/23 9:11 AM [facility]  For contact information, see Amion. If no response to pager, please call PCCM consult pager. After hours, 7PM- 7AM, please call Elink.

## 2023-12-13 NOTE — Plan of Care (Signed)
  Problem: Education: Goal: Ability to describe self-care measures that may prevent or decrease complications (Diabetes Survival Skills Education) will improve Outcome: Progressing Goal: Individualized Educational Video(s) Outcome: Progressing   Problem: Coping: Goal: Ability to adjust to condition or change in health will improve Outcome: Progressing   Problem: Fluid Volume: Goal: Ability to maintain a balanced intake and output will improve Outcome: Progressing   Problem: Health Behavior/Discharge Planning: Goal: Ability to identify and utilize available resources and services will improve Outcome: Progressing Goal: Ability to manage health-related needs will improve Outcome: Progressing   Problem: Metabolic: Goal: Ability to maintain appropriate glucose levels will improve Outcome: Progressing   Problem: Nutritional: Goal: Maintenance of adequate nutrition will improve Outcome: Not Progressing   Problem: Skin Integrity: Goal: Risk for impaired skin integrity will decrease Outcome: Progressing   Problem: Tissue Perfusion: Goal: Adequacy of tissue perfusion will improve Outcome: Progressing   Problem: Education: Goal: Knowledge of General Education information will improve Description: Including pain rating scale, medication(s)/side effects and non-pharmacologic comfort measures Outcome: Progressing   Problem: Health Behavior/Discharge Planning: Goal: Ability to manage health-related needs will improve Outcome: Progressing   Problem: Clinical Measurements: Goal: Ability to maintain clinical measurements within normal limits will improve Outcome: Progressing Goal: Will remain free from infection Outcome: Progressing Goal: Diagnostic test results will improve Outcome: Progressing Goal: Respiratory complications will improve Outcome: Progressing Goal: Cardiovascular complication will be avoided Outcome: Progressing   Problem: Activity: Goal: Risk for activity  intolerance will decrease Outcome: Progressing   Problem: Nutrition: Goal: Adequate nutrition will be maintained Outcome: Progressing   Problem: Coping: Goal: Level of anxiety will decrease Outcome: Progressing   Problem: Elimination: Goal: Will not experience complications related to bowel motility Outcome: Progressing Goal: Will not experience complications related to urinary retention Outcome: Progressing   Problem: Pain Managment: Goal: General experience of comfort will improve and/or be controlled Outcome: Progressing   Problem: Safety: Goal: Ability to remain free from injury will improve Outcome: Progressing   Problem: Skin Integrity: Goal: Risk for impaired skin integrity will decrease Outcome: Progressing

## 2023-12-13 NOTE — Progress Notes (Signed)
 eLink Physician-Brief Progress Note Patient Name: Nicholas Burke DOB: 1945/11/01 MRN: [id number]   Date of Service  12/13/2023  HPI/Events of Note    eICU Interventions  Hypokalemia -repleted gingerly due to AKI     Intervention Category Intermediate Interventions: Electrolyte abnormality - evaluation and management  [name] 12/13/2023, 4:46 AM

## 2023-12-13 NOTE — TOC Progression Note (Signed)
 Transition of Care [facility]) - Progression Note    Patient Details  Name: Nicholas Burke MRN: [id number] Date of Birth: April 25, 1946  Transition of Care [facility]) CM/SW Contact  Justina Delcia Czar, RN Phone Number: [phone] 12/13/2023, 4:52 PM  Clinical Narrative:    Spoke to pt and wife at bedside. Waiting PT/OT recommendation. Will follow for [facility] and DME need for home.  Chart reviewed for discharge readiness, patient not medically stable for d/c. Inpatient CM/CSW will continue to monitor pt's advancement through interdisciplinary progression rounds. If new pt transition needs arise, MD please place a TOC consult.       Expected Discharge Plan: Home/Self Care Barriers to Discharge: Continued Medical Work up   Expected Discharge Plan and Services In-house Referral: NA Discharge Planning Services: CM Consult Post Acute Care Choice: NA Living arrangements for the past 2 months: Single Family Home                   DME Agency: NA      Social Drivers of Health (SDOH) Interventions SDOH Screenings   Food Insecurity: No Food Insecurity (12/06/2023)  Housing: Low Risk  (12/06/2023)  Transportation Needs: No Transportation Needs (12/06/2023)  Utilities: Not At Risk (12/06/2023)  Social Connections: Socially Integrated (12/06/2023)  Tobacco Use: Low Risk  (12/06/2023)    Readmission Risk Interventions     No data to display

## 2023-12-13 NOTE — Consult Note (Signed)
 Nephrology Consult   Requesting provider: Leita Gaskins Service requesting consult: CCM Reason for consult: AKI   Assessment/Recommendations: Nicholas Burke is a/an 78 y.o. male with a past medical history DM2, HTN, prostate cancer who present w/ aortic dissection complicated by AKI  Non-Oliguric AKI: Baseline appears to be normal.  Decent urine output.  Likely some contrast associated rising creatinine but also significant dehydration from high NG tube output.  ATN possible.  Despite creatinine improving today BUN continues to rise.  However, BUN rise could be multifactorial.  Notably the patient did receive a fleets enema which should be avoided in AKI -Continue with aggressive hydration -Continue to monitor daily Cr, Dose meds for GFR -Monitor Daily I/Os, Daily weight  -Maintain MAP>65 for optimal renal perfusion.  -Avoid nephrotoxic medications including NSAIDs -Use synthetic opioids (Fentanyl /Dilaudid ) if needed - Obtain urinalysis - Bladder scan to ensure no retention - No need for dialysis at this time but will continue to monitor  Aortic dissection: Vascular surgery consulted.  Conservative management at this time  Hypertensive emergency: IV medications because of ileus  Severe ileus: With NG tube.  Monitor output.  Management per primary team  Type 2 diabetes: Management per primary team  Hypokalemia: Continue to replete as needed  Hyperphosphatemia: Moderate elevation.  Continue to monitor for now   Recommendations conveyed to primary service.    Jayson JINNY Player [facility] 12/13/2023 9:00 AM   _____________________________________________________________________________________ CC: epigastric pain  History of Present Illness: Nicholas Burke is a/an 78 y.o. male with a past medical history of HTN, DM2, and prostate cancer who presents with epigastric pain.  The patient presented to the hospital 6 days ago with epigastric pain.  In the emergency department  he was noted to be hypertensive.  Ultimately CT abdomen and pelvis was obtained which demonstrated intramural hematoma involving the descending thoracic aorta extending throughout the abdominal aorta and into the right external iliac artery.  The renal arteries were not compromised based on imaging results.  Vascular surgery was consulted and has recommended conservative management.  Since arrival the patient's creatinine has risen from 1.2 up to 3.1 today.  Creatinine was 4 yesterday and the patient received extensive hydration.  He has developed an ileus and has significant output from his NG.  Despite creatinine improving today his BUN did rise slightly.  He denies fevers, chills, shortness of breath, chest pain, nausea, vomiting at this time.  No diarrhea.  Urine output remains fair.   Medications:  Current Facility-Administered Medications  Medication Dose Route Frequency Provider Last Rate Last Admin   acetaminophen  (TYLENOL ) tablet 650 mg  650 mg Oral Q6H PRN Celinda Alm Lot, MD   650 mg at 12/10/23 2144   Or   acetaminophen  (TYLENOL ) suppository 650 mg  650 mg Rectal Q6H PRN Celinda Alm Lot, MD       bisacodyl  (DULCOLAX) suppository 10 mg  10 mg Rectal Daily PRN Gleason, Laura R, PA-C   10 mg at 12/11/23 9065   Chlorhexidine  Gluconate Cloth 2 % PADS 6 each  6 each Topical Daily Alghanim, Fahid, MD   6 each at 12/12/23 1000   clevidipine  (CLEVIPREX ) infusion 0.5 mg/mL  0-21 mg/hr Intravenous Continuous Claudene Toribio BROCKS, MD 12 mL/hr at 12/13/23 0814 6 mg/hr at 12/13/23 0814   esmolol  (BREVIBLOC ) 2000 mg / 100 mL (20 mg/mL) infusion  25-300 mcg/kg/min Intravenous Titrated Claudene Toribio BROCKS, MD 20.5 mL/hr at 12/13/23 0814 75 mcg/kg/min at 12/13/23 0814   feeding supplement (ENSURE PLUS  HIGH PROTEIN) liquid 237 mL  237 mL Oral BID BM Claudene Toribio BROCKS, MD   237 mL at 12/09/23 1302   labetalol  (NORMODYNE ) injection 10 mg  10 mg Intravenous Q2H PRN Claudene Toribio BROCKS, MD   10 mg at 12/10/23 1122    lactated ringers  infusion   Intravenous Continuous Harold Scholz, MD   Stopped at 12/13/23 0815   metoCLOPramide  (REGLAN ) injection 5 mg  5 mg Intravenous Q8H Claudene Toribio BROCKS, MD   5 mg at 12/12/23 2121   neostigmine  (BLOXIVERZ ) injection 0.25 mg  0.25 mg Subcutaneous Q6H Gretta Doffing P, DO       ondansetron  (ZOFRAN ) tablet 4 mg  4 mg Oral Q6H PRN Celinda Alm Lot, MD   4 mg at 12/07/23 9270   Or   ondansetron  (ZOFRAN ) injection 4 mg  4 mg Intravenous Q6H PRN Celinda Alm Lot, MD   4 mg at 12/09/23 1845   Oral care mouth rinse  15 mL Mouth Rinse PRN Alghanim, Fahid, MD       polyethylene glycol (MIRALAX  / GLYCOLAX ) packet 17 g  17 g Oral BID Gleason, Laura R, PA-C   17 g at 12/09/23 0900     ALLERGIES Patient has no known allergies.  MEDICAL HISTORY Past Medical History:  Diagnosis Date   Hx of radiation therapy 01/02/13- 02/24/13   prostate 7800 cGy/40 sessions, seminal vesicles 5600 cGy/40 sessions   Hypertension    Pre-diabetes    Prostate cancer (HCC) 08/30/2012   gleason 6, vol 65.7 cc     SOCIAL HISTORY Social History   Socioeconomic History   Marital status: Married    Spouse name: Not on file   Number of children: 4   Years of education: Not on file   Highest education level: Not on file  Occupational History    Employer: [employer]  Tobacco Use   Smoking status: Never   Smokeless tobacco: Never  Vaping Use   Vaping status: Never Used  Substance and Sexual Activity   Alcohol use: No   Drug use: No   Sexual activity: Not on file  Other Topics Concern   Not on file  Social History Narrative   Not on file   Social Drivers of Health   Financial Resource Strain: Not on file  Food Insecurity: No Food Insecurity (12/06/2023)   Hunger Vital Sign    Worried About Running Out of Food in the Last Year: Never true    Ran Out of Food in the Last Year: Never true  Transportation Needs: No Transportation Needs (12/06/2023)   PRAPARE - Doctor, general practice (Medical): No    Lack of Transportation (Non-Medical): No  Physical Activity: Not on file  Stress: Not on file  Social Connections: Socially Integrated (12/06/2023)   Social Connection and Isolation Panel    Frequency of Communication with Friends and Family: Three times a week    Frequency of Social Gatherings with Friends and Family: Three times a week    Attends Religious Services: More than 4 times per year    Active Member of Clubs or Organizations: Yes    Attends Banker Meetings: More than 4 times per year    Marital Status: Married  Catering manager Violence: Not At Risk (12/06/2023)   Humiliation, Afraid, Rape, and Kick questionnaire    Fear of Current or Ex-Partner: No    Emotionally Abused: No    Physically Abused: No    Sexually  Abused: No     FAMILY HISTORY Family History  Problem Relation Age of Onset   Prostate cancer Father    Prostate cancer Brother       Review of Systems: 12 systems reviewed Otherwise as per HPI, all other systems reviewed and negative  Physical Exam: Vitals:   12/13/23 0700 12/13/23 0813  BP:  118/88  Pulse: 90 82  Resp: 20 19  Temp: 97.7 F (36.5 C)   SpO2: 91% 94%   Total I/O In: 252.3 [I.V.:220.3; NG/GT:10; IV Piggyback:22] Out: -   Intake/Output Summary (Last 24 hours) at 12/13/2023 0900 Last data filed at 12/13/2023 0815 Gross per 24 hour  Intake 5932.08 ml  Output 2150 ml  Net 3782.08 ml   General: ill-appearing, no acute distress HEENT: anicteric sclera, oropharynx clear without lesions CV: normal rate, no rub Lungs: clear to auscultation bilaterally, mild increased work of breathing Abd: soft, non-tender, non-distended Skin: no visible lesions or rashes Psych: alert, engaged, appropriate mood and affect Musculoskeletal: no obvious deformities Neuro: normal speech, no gross focal deficits   Test Results Reviewed Lab Results  Component Value Date   NA 142 12/13/2023   K 3.3 (L)  12/13/2023   CL 89 (L) 12/13/2023   CO2 35 (H) 12/13/2023   BUN 104 (H) 12/13/2023   CREATININE 3.06 (H) 12/13/2023   CALCIUM  8.1 (L) 12/13/2023   ALBUMIN  2.4 (L) 12/13/2023   PHOS 7.1 (H) 12/13/2023    CBC Recent Labs  Lab 12/11/23 0343 12/12/23 0406 12/13/23 0313  WBC 4.3 8.0 10.9*  HGB 13.5 14.5 14.5  HCT 40.1 42.6 44.1  MCV 79.9* 79.5* 82.1  PLT 270 307 335    I have reviewed all relevant outside healthcare records related to the patient's current hospitalization

## 2023-12-14 ENCOUNTER — Inpatient Hospital Stay (HOSPITAL_COMMUNITY)

## 2023-12-14 ENCOUNTER — Other Ambulatory Visit: Payer: Self-pay

## 2023-12-14 DIAGNOSIS — I71012 Dissection of descending thoracic aorta: Secondary | ICD-10-CM | POA: Diagnosis not present

## 2023-12-14 DIAGNOSIS — I722 Aneurysm of renal artery: Secondary | ICD-10-CM | POA: Diagnosis not present

## 2023-12-14 DIAGNOSIS — E440 Moderate protein-calorie malnutrition: Secondary | ICD-10-CM | POA: Diagnosis not present

## 2023-12-14 DIAGNOSIS — K567 Ileus, unspecified: Secondary | ICD-10-CM | POA: Diagnosis not present

## 2023-12-14 DIAGNOSIS — J9601 Acute respiratory failure with hypoxia: Secondary | ICD-10-CM | POA: Diagnosis not present

## 2023-12-14 LAB — RENAL FUNCTION PANEL
Albumin: 2.2 g/dL — ABNORMAL LOW (ref 3.5–5.0)
Anion gap: 14 (ref 5–15)
BUN: 74 mg/dL — ABNORMAL HIGH (ref 8–23)
CO2: 38 mmol/L — ABNORMAL HIGH (ref 22–32)
Calcium: 8.3 mg/dL — ABNORMAL LOW (ref 8.9–10.3)
Chloride: 95 mmol/L — ABNORMAL LOW (ref 98–111)
Creatinine, Ser: 2 mg/dL — ABNORMAL HIGH (ref 0.61–1.24)
GFR, Estimated: 34 mL/min — ABNORMAL LOW (ref >60)
Glucose, Bld: 114 mg/dL — ABNORMAL HIGH (ref 70–99)
Phosphorus: 4.6 mg/dL (ref 2.5–4.6)
Potassium: 3.4 mmol/L — ABNORMAL LOW (ref 3.5–5.1)
Sodium: 147 mmol/L — ABNORMAL HIGH (ref 135–145)

## 2023-12-14 LAB — GLUCOSE, CAPILLARY
Glucose-Capillary: 146 mg/dL — ABNORMAL HIGH (ref 70–99)
Glucose-Capillary: 109 mg/dL — ABNORMAL HIGH (ref 70–99)
Glucose-Capillary: 171 mg/dL — ABNORMAL HIGH (ref 70–99)

## 2023-12-14 LAB — CBC
HCT: 41.9 % (ref 39.0–52.0)
Hemoglobin: 13.2 g/dL (ref 13.0–17.0)
MCH: 26.5 pg (ref 26.0–34.0)
MCHC: 31.5 g/dL (ref 30.0–36.0)
MCV: 84 fL (ref 80.0–100.0)
Platelets: 320 K/uL (ref 150–400)
RBC: 4.99 MIL/uL (ref 4.22–5.81)
RDW: 14.6 % (ref 11.5–15.5)
WBC: 13.7 K/uL — ABNORMAL HIGH (ref 4.0–10.5)
nRBC: 0 % (ref 0.0–0.2)

## 2023-12-14 LAB — MAGNESIUM: Magnesium: 2.7 mg/dL — ABNORMAL HIGH (ref 1.7–2.4)

## 2023-12-14 MED ORDER — TRAVASOL 10 % IV SOLN
INTRAVENOUS | Status: AC
  Filled 2023-12-14: qty 499.2

## 2023-12-14 MED ORDER — LACTULOSE ENEMA
300.0000 mL | Freq: Every day | ORAL | Status: DC
  Administered 2023-12-14 – 2023-12-16 (×3): 300 mL via RECTAL
  Filled 2023-12-14 (×4): qty 300

## 2023-12-14 MED ORDER — TRAVASOL 10 % IV SOLN: INTRAVENOUS | Status: DC

## 2023-12-14 MED ORDER — LACTATED RINGERS IV SOLN: INTRAVENOUS | Status: AC

## 2023-12-14 MED ORDER — POTASSIUM CHLORIDE 10 MEQ/100ML IV SOLN
10.0000 meq | INTRAVENOUS | Status: AC
  Administered 2023-12-14 (×4): 10 meq via INTRAVENOUS
  Filled 2023-12-14 (×4): qty 100

## 2023-12-14 MED ORDER — HYDRALAZINE HCL 20 MG/ML IJ SOLN
10.0000 mg | INTRAMUSCULAR | Status: DC | PRN
  Administered 2023-12-14 – 2023-12-15 (×3): 10 mg via INTRAVENOUS
  Filled 2023-12-14 (×4): qty 1

## 2023-12-14 MED ORDER — PHENOL 1.4 % MT LIQD
1.0000 | OROMUCOSAL | Status: DC | PRN
Start: 2023-12-14 — End: 2024-01-01
  Administered 2023-12-14: 1 via OROMUCOSAL
  Filled 2023-12-14: qty 177

## 2023-12-14 MED ORDER — SODIUM CHLORIDE 0.9% FLUSH: 10.0000 mL | INTRAVENOUS | Status: DC | PRN

## 2023-12-14 MED ORDER — POTASSIUM CHLORIDE 10 MEQ/100ML IV SOLN: 10.0000 meq | INTRAVENOUS | Status: DC

## 2023-12-14 MED ORDER — SODIUM CHLORIDE 0.9% FLUSH
10.0000 mL | Freq: Two times a day (BID) | INTRAVENOUS | Status: DC
  Administered 2023-12-14 – 2023-12-17 (×6): 10 mL

## 2023-12-14 MED ORDER — INSULIN ASPART 100 UNIT/ML IJ SOLN
0.0000 [IU] | INTRAMUSCULAR | Status: DC
  Administered 2023-12-14: 3 [IU] via SUBCUTANEOUS
  Administered 2023-12-14: 2 [IU] via SUBCUTANEOUS
  Administered 2023-12-15 (×3): 3 [IU] via SUBCUTANEOUS
  Administered 2023-12-15: 5 [IU] via SUBCUTANEOUS
  Administered 2023-12-15 (×2): 3 [IU] via SUBCUTANEOUS
  Administered 2023-12-16 (×2): 2 [IU] via SUBCUTANEOUS
  Administered 2023-12-16: 3 [IU] via SUBCUTANEOUS
  Administered 2023-12-16: 2 [IU] via SUBCUTANEOUS
  Administered 2023-12-16 (×2): 3 [IU] via SUBCUTANEOUS
  Administered 2023-12-17: 2 [IU] via SUBCUTANEOUS
  Administered 2023-12-17: 3 [IU] via SUBCUTANEOUS

## 2023-12-14 MED ORDER — METOCLOPRAMIDE HCL 5 MG/ML IJ SOLN
5.0000 mg | Freq: Three times a day (TID) | INTRAMUSCULAR | Status: DC
  Administered 2023-12-14 – 2023-12-15 (×3): 5 mg via INTRAVENOUS
  Filled 2023-12-14 (×3): qty 2

## 2023-12-14 MED ORDER — POTASSIUM CHLORIDE 10 MEQ/50ML IV SOLN
10.0000 meq | INTRAVENOUS | Status: AC
  Administered 2023-12-14 (×4): 10 meq via INTRAVENOUS
  Filled 2023-12-14 (×4): qty 50

## 2023-12-14 MED ORDER — KCL IN DEXTROSE-NACL 10-5-0.45 MEQ/L-%-% IV SOLN
INTRAVENOUS | Status: DC
  Filled 2023-12-14 (×2): qty 1000

## 2023-12-14 MED ORDER — THIAMINE HCL 100 MG/ML IJ SOLN
100.0000 mg | Freq: Every day | INTRAMUSCULAR | Status: AC
  Administered 2023-12-14 – 2023-12-18 (×5): 100 mg via INTRAVENOUS
  Filled 2023-12-14 (×5): qty 2

## 2023-12-14 NOTE — Progress Notes (Signed)
 Initial Nutrition Assessment  DOCUMENTATION CODES:   Non-severe (moderate) malnutrition in context of acute illness/injury  INTERVENTION:   TPN initiation today Add sliding scale insulin  coverage Currently receiving additional calories via dextrose  in D5-1/2NS (816 kcals) and via lipids in Cleviprex  infusion. Cleviprex  now OFF Thiamine  100 mg daily x 5 days Daily electrolyte labs Daily weights   Once diet advanced and pt tolerating, will add Boost Chocolate. Pt drinks 1 each day, will plan to order more frequently until pt tolerating solid diet with adequate po intake.   NUTRITION DIAGNOSIS:   Moderate Malnutrition related to acute illness (Type B aortic dissection with hematoma causing prolonged SB ileus) as evidenced by energy intake < 75% for > 7 days, mild fat depletion, mild muscle depletion.  GOAL:   Patient will meet greater than or equal to 90% of their needs   MONITOR:   Diet advancement, Weight trends, Labs, Skin, I & O's (TPN)  REASON FOR ASSESSMENT:   Consult New TPN/TNA  ASSESSMENT:   78 yo male admitted with type B aortic dissection with intramural hematoma likely secondary to poorly controlled HTN; +HTN emergency requiring cleviprex . +AKI. Pt developed severe ileus due to dissection.   PMH includes DM, HTN, prostate cancer  8/11 Admitted with abd pain, +large intramural hematoma, type B aortic dissection 8/16 NGT inserted for decompression 8/18 Abd xray with improved gaseous distention of stomach, Diffuse gaseous distention of small bowel evident with small bowel loops measuring up to 4.4 cm diameter in the mid abdomen  Dietitian Consult received for initiation of TPN. Plan for PICC line today  Remains on Cleviprex  but weaning this AM and now off. Esmolol  gtt continues  NPO, NG with 8.3L output in 24 hours. RN reports several liters of this may have been from previous 24 hours as issues with NG suction; once resolve, pt immediately put out 2-3 L. Of  note, pt is also taking in ice chips  Abdomen distended, but soft. +BM type 1. +BS.  +neostigmine  initiated yesterday. 2 small type 1 BMs in 24 hours. Noted addition of lactulose  enema and reglan  today. Noted pt has received multiple enemas previously  +non-oliguric AKI, UOP 1.6 L in 24 hours  Noted LR changed to D5-12 NS with supplemental K+ at 200 ml/hr (816 kcals in 24 hours)  by Nephrology today. BUN/Creatinine trending down, sodium trending up/ Chloride low.   Pt's wife and daughter in room. Prior to admission, pt with good appetite, eating normally. Pt is retired but is fairly active; pt works around [employer] and in the yard, pt also helps his neighbors with their yards.   Pt typically eats 2 meals per day with Breakfast being his biggest/best meal of the day. Breakfast usually consists of eggs with spinach, grits with flax seed, toast with honey and bacon/sausage. Dinner is usually a protein like hamburger patty, steak, or flounder with starch like mac n cheese and mixed veggies. Pt mostly drinks water , sips of Sprite. During the day, pt will snack sometimes; snacks include NABS, fruit,Nutri-grain bars, etc. Pt also drinks a Boost shake each day.   Denies any recent wt loss; UBW around 200 pounds.   Current wt 91.2 kg but from 8/16. Requested new weight, RN able to obtain standing weight which is currently 88.7 kg   Labs: Sodium 147 (H) Potassium 3.4 (L) Magnesium  BUN 74 Creatinine 2.0 Phosphorus 4.6 (wdl) T.Bili 1.1 (wdl), LFTs wdl No CBGs since 8/13. Serum glucose 114, HgbA1c 7.1 (H)  Meds:  Dulcolax prn Lactulose  enema x 1 Reglan  5mg  q 8 hours Neostigmine  q 6 hours Zonfran KCl  NUTRITION - FOCUSED PHYSICAL EXAM:  Flowsheet Row Most Recent Value  Orbital Region No depletion  Upper Arm Region Moderate depletion  Thoracic and Lumbar Region No depletion  Buccal Region Mild depletion  Temple Region Mild depletion  Clavicle Bone Region Mild depletion  Clavicle and  Acromion Bone Region Mild depletion  Scapular Bone Region Mild depletion  Dorsal Hand Mild depletion  Patellar Region Moderate depletion  Anterior Thigh Region Moderate depletion  Posterior Calf Region Moderate depletion  Edema (RD Assessment) None  Hair Reviewed  Eyes Reviewed  Mouth Reviewed  Skin Reviewed  Nails Reviewed    Diet Order:   Diet Order             Diet NPO time specified  Diet effective midnight                   EDUCATION NEEDS:   Education needs have been addressed  Skin:  Skin Assessment: Reviewed RN Assessment  Last BM:  8/19 type 1 small  Height:   Ht Readings from Last 1 Encounters:  12/06/23 5' 10 (1.778 m)    Weight:   Wt Readings from Last 1 Encounters:  12/14/23 88.7 kg    BMI:  Body mass index is 28.06 kg/m.  Estimated Nutritional Needs:   Kcal:  1900-2100 kcals  Protein:  95-105 g  Fluid:  >/= 2L (not taking into account high NG output)  Betsey Finger MS, RDN, LDN, CNSC Registered Dietitian 3 Clinical Nutrition RD Inpatient Contact Info in Amion

## 2023-12-14 NOTE — Progress Notes (Signed)
 Renal artery duplex has been completed.   Results can be found under chart review under CV PROC. 12/14/2023 1:21 PM Yehonatan Grandison RVT, RDMS

## 2023-12-14 NOTE — Progress Notes (Addendum)
 Nephrology Follow-Up Consult note   Assessment/Recommendations: Nicholas Burke is a/an 78 y.o. male with a past medical history significant for DM2, HTN, prostate cancer who present w/ aortic dissection complicated by AKI   Non-Oliguric AKI: Baseline appears to be normal.  May have had some tubular injury but mostly related to dehydration.  Improving with hydration -Continue with aggressive hydration; changed to lactated Ringer 's to D5 half-normal with supplemental potassium given hypokalemia and borderline hypernatremia -Continue to monitor daily Cr, Dose meds for GFR -Monitor Daily I/Os, Daily weight  -Maintain MAP>65 for optimal renal perfusion.  -Avoid nephrotoxic medications including NSAIDs -Use synthetic opioids (Fentanyl /Dilaudid ) if needed  Given the patient's improvement we will sign off at this time.  Continue with aggressive hydration.  Switch to oral hydration once ileus resolves   Aortic dissection: Vascular surgery consulted.  Conservative management at this time   Hypertensive emergency: IV medications because of ileus   Severe ileus: With NG tube.  Monitor output.  Management per primary team   Type 2 diabetes: Management per primary team   Hypokalemia: Continue to replete as needed   Hyperphosphatemia: resolved with improving creatinine  Alkalosis: likely metabolic from NG tube losses. No therapy needed. Monitor blood gas intermittently. Consider acetazolamide if it worsens   Recommendations conveyed to primary service.    Jayson JINNY Player [facility] 12/14/2023 9:17 AM  ___________________________________________________________  CC: epigastric pain  Interval History/Subjective: Patient states he feels okay today denies any complaints.  High NG tube output.  Creatinine improved to 2   Medications:  Current Facility-Administered Medications  Medication Dose Route Frequency Provider Last Rate Last Admin   acetaminophen  (TYLENOL ) tablet 650 mg   650 mg Oral Q6H PRN Celinda Alm Lot, MD   650 mg at 12/10/23 2144   Or   acetaminophen  (TYLENOL ) suppository 650 mg  650 mg Rectal Q6H PRN Celinda Alm Lot, MD       bisacodyl  (DULCOLAX) suppository 10 mg  10 mg Rectal Daily PRN Gleason, Laura R, PA-C   10 mg at 12/11/23 9065   Chlorhexidine  Gluconate Cloth 2 % PADS 6 each  6 each Topical Daily Alghanim, Fahid, MD   6 each at 12/13/23 1000   clevidipine  (CLEVIPREX ) infusion 0.5 mg/mL  0-21 mg/hr Intravenous Continuous Claudene Toribio BROCKS, MD 16 mL/hr at 12/14/23 0800 8 mg/hr at 12/14/23 0800   dextrose  5 % and 0.45 % NaCl with KCl 10 mEq/L infusion   Intravenous Continuous Player Jayson JINNY, MD 200 mL/hr at 12/14/23 0800 Infusion Verify at 12/14/23 0800   esmolol  (BREVIBLOC ) 2000 mg / 100 mL (20 mg/mL) infusion  25-300 mcg/kg/min Intravenous Titrated Claudene Toribio BROCKS, MD 20.5 mL/hr at 12/14/23 0800 75 mcg/kg/min at 12/14/23 0800   hydrALAZINE  (APRESOLINE ) injection 10 mg  10 mg Intravenous Q4H PRN Gleason, Laura R, PA-C       labetalol  (NORMODYNE ) injection 10 mg  10 mg Intravenous Q2H PRN Claudene Toribio BROCKS, MD   10 mg at 12/10/23 1122   neostigmine  (BLOXIVERZ ) injection 0.25 mg  0.25 mg Subcutaneous Q6H Gretta Doffing P, DO   0.25 mg at 12/14/23 0401   ondansetron  (ZOFRAN ) tablet 4 mg  4 mg Oral Q6H PRN Celinda Alm Lot, MD   4 mg at 12/07/23 9270   Or   ondansetron  (ZOFRAN ) injection 4 mg  4 mg Intravenous Q6H PRN Celinda Alm Lot, MD   4 mg at 12/09/23 1845   Oral care mouth rinse  15 mL Mouth Rinse PRN Alghanim, Fahid,  MD       potassium chloride  10 mEq in 100 mL IVPB  10 mEq Intravenous Q1 Hr x 4 Dela Sheela Kieth HERO, MD 100 mL/hr at 12/14/23 0907 10 mEq at 12/14/23 9092      Review of Systems: 10 systems reviewed and negative except per interval history/subjective  Physical Exam: Vitals:   12/14/23 0828 12/14/23 0830  BP: 123/68 123/68  Pulse: 80 82  Resp: (!) 8 18  Temp:    SpO2: 96% 96%   Total I/O In: 656.7 [I.V.:457.7;  NG/GT:30; IV Piggyback:169] Out: -   Intake/Output Summary (Last 24 hours) at 12/14/2023 9082 Last data filed at 12/14/2023 0800 Gross per 24 hour  Intake 7492.06 ml  Output 6800 ml  Net 692.06 ml   Constitutional: ill-appearing, no acute distress ENMT: ears and nose without scars or lesions, MMM CV: normal rate, no edema in ble Respiratory: bilateral chest rise, mild increased work of breathing Gastrointestinal: soft, non-tender, no palpable masses or hernias Skin: no visible lesions or rashes Psych: alert, judgement/insight appropriate, appropriate mood and affect   Test Results I personally reviewed new and old clinical labs and radiology tests Lab Results  Component Value Date   NA 147 (H) 12/14/2023   K 3.4 (L) 12/14/2023   CL 95 (L) 12/14/2023   CO2 38 (H) 12/14/2023   BUN 74 (H) 12/14/2023   CREATININE 2.00 (H) 12/14/2023   CALCIUM  8.3 (L) 12/14/2023   ALBUMIN  2.2 (L) 12/14/2023   PHOS 4.6 12/14/2023    CBC Recent Labs  Lab 12/11/23 0343 12/12/23 0406 12/13/23 0313  WBC 4.3 8.0 10.9*  HGB 13.5 14.5 14.5  HCT 40.1 42.6 44.1  MCV 79.9* 79.5* 82.1  PLT 270 307 335

## 2023-12-14 NOTE — Progress Notes (Signed)
 NAME:  Nicholas Burke, MRN:  [id number], DOB:  Dec 07, 1945, LOS: 8 ADMISSION DATE:  12/06/2023, CONSULTATION DATE:  12/06/2023 REFERRING MD: Dr. Celinda - TR[facility], CHIEF COMPLAINT: Intramural hematoma  History of Present Illness:  Nicholas Burke is a 78 year old male with a past medical history significant for prediabetes, hypertension, and prostate cancer who presented to the ED at [facility] 8/11 for complaints of epigastric pain.  On ED arrival patient was seen hypertensive with all other vital signs within normal limit.  CT abdomen pelvis obtained and revealed extensive intramural hematoma involving the descending thoracic aorta extending throughout the abdominal aorta and into the right external iliac artery.  CTA chest again confirms intramural hematoma beginning at the transverse aorta origin of the left subclavian artery and extending throughout the thoracic aorta and into the abdominal aorta.  Lab work significant for potassium 2.9.  Vascular surgery evaluated patient in ED and recommended tight blood pressure and heart rate control, PCCM consulted in this setting.   Pertinent  Medical History  prediabetes, hypertension, and prostate cancer  Significant Hospital Events: Including procedures, antibiotic start and stop dates in addition to other pertinent events   8/11 presented with epigastric abdominal pain found to have large intramural hematoma 8/14 remains on esmolol , no indication for urgent surgical intervention 8/15 ongoing issues with ileus 8/16 NGT placed to decompress 8/19 up walking, minimal BM and continued high output from NGT  Interim History / Subjective:  High output from NGT  Few small BM's Clinically feels better  Remains on Cleviprex  and Esmolol    Objective    Blood pressure 123/67, pulse 80, temperature 98.3 F (36.8 C), temperature source Oral, resp. rate (!) 8, height 5' 10 (1.778 m), weight 91.2 kg, SpO2 96%.        Intake/Output Summary (Last 24 hours) at 12/14/2023  0830 Last data filed at 12/14/2023 0800 Gross per 24 hour  Intake 7492.06 ml  Output 7900 ml  Net -407.94 ml   Filed Weights   12/09/23 0500 12/10/23 0500 12/11/23 0645  Weight: 94.7 kg 95.5 kg 91.2 kg   Physical exam: General: ill appearing man resting in bed in NAD HEENT: [place]/AT, eyes anicteric Neuro: Awake, alert, answering questions appropriately. Moving extremities.  Chest: mildly diminished in the bilateral bases without rhonchi or wheezing, no distress on  Heart: S1S2, RRR Abdomen: hypoactive BS, distended without significant TTP   NG output: 8L yesterday   K+ 3.4 BUN 74 Cr 2.0 Mag 2.7 WBC 10.9    Assessment and Plan  Type B aortic dissection with intramural hematoma, likely related to longstanding hypertension Hypertensive emergency -weaned off cleviprex  in case CCB worsening ileus -con't esmolol  and prn hydral to maintain  <60, SBP >120 -appreciate VVS's management  Severe ileus due to dissection -NGT flushed, back to suction this morning due to severe distention -TPN and PICC today -con't daily neostigmine  -ambulate -lactulose  enema today -GI consult   Acute respiratory failure with hypoxia; suspect he has been aspirating from nonfunctioning NGT with rising oxygen requirements rapidly overnight -decompress stomach -supplemental O2 to maintain SpO2 > 90% -ambulate -stable pleural effusion, may need thora  Hypokalemia -repleted, monitor -replete aggressively with high NGT output  HLD -eventually needs statin; on hold now while NPO  DM2, well-controlled -monitor; not needing insulin   Hx prostate cancer; in remission; had local radiation treatment  AKI, prerenal versus intrarenal due to contrast induced -maintain euvolemia -nephrology consulted, creatine stable and continues to make urine 0.7cc/kg/hr   Hyperphosphatemia -monitor  Lactic  acidosis on 8/17 -downt-trended   Nicholas Burke and his daughter were updated at bedside during rounds.    CRITICAL CARE Performed by: Leita SAUNDERS Khamiya Varin   Total critical care time: 40 minutes  Critical care time was exclusive of separately billable procedures and treating other patients.  Critical care was necessary to treat or prevent imminent or life-threatening deterioration.  Critical care was time spent personally by me on the following activities: development of treatment plan with patient and/or surrogate as well as nursing, discussions with consultants, evaluation of patient's response to treatment, examination of patient, obtaining history from patient or surrogate, ordering and performing treatments and interventions, ordering and review of laboratory studies, ordering and review of radiographic studies, pulse oximetry and re-evaluation of patient's condition.  Leita SAUNDERS Niesha Bame, PA-C [facility] See Amion for pager If no response to pager , please call [phone] until 7pm After 7:00 pm call Elink  [phone]

## 2023-12-14 NOTE — Progress Notes (Signed)
 PHARMACY - TOTAL PARENTERAL NUTRITION CONSULT NOTE   Indication: Prolonged ileus  Patient Measurements: Height: 5' 10 (177.8 cm) Weight: 88.7 kg (195 lb 8.8 oz) (Standing) IBW/kg (Calculated) : 73 TPN AdjBW (KG): 90.7 Body mass index is 28.06 kg/m. Usual Weight: 91.2 kg   Assessment: Patient is a 78 yo M admitted with aortic dissection with intramural hematoma. Patient developed severe ileus secondary to dissection.   Patient was active and had good nutrition PTA. Without nutrition for 7 days at the start of TPN. Patient is at moderate risk of refeeding.   Glucose / Insulin : A1c 7.1, CBGs <180 Electrolytes: Na 147, K 3.4 (after ), Phos 4.6, Mag 2.7, Cl 95, Co2 38  Renal: Scr 2 Hepatic:  Intake / Output; MIVF: UOP 0.48ml/kg/min, net -2L since admission, NG set to decompress -8.2L  GI Imaging:  8/11- Extensive intramural hematoma involving the descending thoracic aorta extending throughout the abdominal aorta and into the right inflow vessels terminating in the mid right external iliac artery. 8/14- There is increased amount of gas throughout the small bowel and colon, likely manifestation of adynamic ileus 8/17- Persistent but improved gaseous gastric distension  8/19 - unchanged  GI Surgeries / Procedures:   Central access: PICC  TPN start date: 8/19  Nutritional Goals: Goal TPN rate is 80 mL/hr (provides 100 g of protein and 1994 kcals per day)  RD Assessment: Estimated Needs Total Energy Estimated Needs: 1900-2100 kcals Total Protein Estimated Needs: 95-105 g Total Fluid Estimated Needs: >/= 2L (not taking into account high NG output)  Current Nutrition:  NPO  Plan:  Start TPN at 40 mL/hr at 1800 Electrolytes in TPN: Na 0 mEq/L, K 67mEq/L, Ca 17mEq/L, Mg 3Eq/L, and Phos 15mmol/L. Cl:Ac max chloride (calculates to 1:1 ratio)  Add standard MVI and trace elements to TPN Initiate Moderate q4h SSI and adjust as needed  MIVF 10 mL/hr at 1800 Thiamine  x 5 days IV per  discussion with RD  Monitor TPN labs on Mon/Thurs  Vermell HERO Advay Volante 12/14/2023,3:40 PM

## 2023-12-14 NOTE — Consult Note (Signed)
 [facility] Consultation Note  Referring Provider: [facility] Primary Care Physician:  Hugh Charleston, MD (Inactive) Primary Gastroenterologist:  Dr. Dianna [facility])  Reason for Consultation:  Ileus  HPI: Nicholas Burke is a 78 y.o. male admitted aortic dissection with intramural hematoma in setting of protracted hypertension.  He has developed ileus.  Has NGT in place.  Having slow return of bowel function (flatus and stool).  Feels degree of abdominal distention has overall improved.   Past Medical History:  Diagnosis Date   Hx of radiation therapy 01/02/13- 02/24/13   prostate 7800 cGy/40 sessions, seminal vesicles 5600 cGy/40 sessions   Hypertension    Pre-diabetes    Prostate cancer (HCC) 08/30/2012   gleason 6, vol 65.7 cc    Past Surgical History:  Procedure Laterality Date   CATARACT EXTRACTION, BILATERAL Bilateral    COLONOSCOPY WITH PROPOFOL  N/A 10/18/2014   Procedure: COLONOSCOPY WITH PROPOFOL ;  Surgeon: Jerrell Dianna, MD;  Location: [facility];  Service: Endoscopy;  Laterality: N/A;   CRYOABLATION N/A 12/24/2020   Procedure: CRYO ABLATION PROSTATE;  Surgeon: [name], MD;  Location: [facility];  Service: Urology;  Laterality: N/A;   EYE SURGERY Bilateral    cataract surgery   HOT HEMOSTASIS N/A 10/18/2014   Procedure: HOT HEMOSTASIS (ARGON PLASMA COAGULATION/BICAP);  Surgeon: Jerrell Dianna, MD;  Location: [facility];  Service: Endoscopy;  Laterality: N/A;   PROSTATE BIOPSY  08/2012    Prior to Admission medications   Medication Sig Start Date End Date Taking? Authorizing Provider  diltiazem  (CARDIZEM  CD) 240 MG 24 hr capsule Take 240 mg by mouth daily. 08/29/20  Yes [provider]  rosuvastatin  (CRESTOR ) 5 MG tablet Take 5 mg by mouth 3 (three) times a week. 11/21/20  Yes [provider]  finasteride (PROSCAR) 5 MG tablet Take 5 mg by mouth daily. Patient not taking: Reported on 12/06/2023    [provider]     Current Facility-Administered Medications  Medication Dose Route Frequency Provider Last Rate Last Admin   acetaminophen  (TYLENOL ) tablet 650 mg  650 mg Oral Q6H PRN Celinda Alm Lot, MD   650 mg at 12/10/23 2144   Or   acetaminophen  (TYLENOL ) suppository 650 mg  650 mg Rectal Q6H PRN Celinda Alm Lot, MD       bisacodyl  (DULCOLAX) suppository 10 mg  10 mg Rectal Daily PRN Gleason, Laura R, PA-C   10 mg at 12/11/23 9065   Chlorhexidine  Gluconate Cloth 2 % PADS 6 each  6 each Topical Daily Alghanim, Paula, MD   6 each at 12/14/23 1032   clevidipine  (CLEVIPREX ) infusion 0.5 mg/mL  0-21 mg/hr Intravenous Continuous Claudene Toribio BROCKS, MD 4 mL/hr at 12/14/23 1100 2 mg/hr at 12/14/23 1100   esmolol  (BREVIBLOC ) 2000 mg / 100 mL (20 mg/mL) infusion  25-300 mcg/kg/min Intravenous Titrated Claudene Toribio BROCKS, MD 41 mL/hr at 12/14/23 1100 149.85 mcg/kg/min at 12/14/23 1100   hydrALAZINE  (APRESOLINE ) injection 10 mg  10 mg Intravenous Q4H PRN Gleason, Laura R, PA-C       insulin  aspart (novoLOG ) injection 0-15 Units  0-15 Units Subcutaneous Q4H Gretta Doffing P, DO       labetalol  (NORMODYNE ) injection 10 mg  10 mg Intravenous Q2H PRN Claudene Toribio BROCKS, MD   10 mg at 12/10/23 1122   lactulose  (CHRONULAC ) enema 200 gm  300 mL Rectal Daily Gretta Doffing P, DO       metoCLOPramide  (REGLAN ) injection 5 mg  5 mg Intravenous Q8H Gretta Doffing SQUIBB,  DO       neostigmine  (BLOXIVERZ ) injection 0.25 mg  0.25 mg Subcutaneous Q6H Gretta Doffing P, DO   0.25 mg at 12/14/23 1030   ondansetron  (ZOFRAN ) tablet 4 mg  4 mg Oral Q6H PRN Celinda Alm Lot, MD   4 mg at 12/07/23 9270   Or   ondansetron  (ZOFRAN ) injection 4 mg  4 mg Intravenous Q6H PRN Celinda Alm Lot, MD   4 mg at 12/09/23 1845   Oral care mouth rinse  15 mL Mouth Rinse PRN Alghanim, Fahid, MD       phenol (CHLORASEPTIC) mouth spray 1 spray  1 spray Mouth/Throat PRN Gretta Doffing SQUIBB, DO   1 spray at 12/14/23 1126   thiamine  (VITAMIN B1) injection 100 mg  100 mg  Intravenous Daily Gretta Doffing P, DO       TPN ADULT (ION)   Intravenous Continuous TPN Gretta Doffing SQUIBB, DO        Allergies as of 12/06/2023   (No Known Allergies)    Family History  Problem Relation Age of Onset   Prostate cancer Father    Prostate cancer Brother     Social History   Socioeconomic History   Marital status: Married    Spouse name: Not on file   Number of children: 4   Years of education: Not on file   Highest education level: Not on file  Occupational History    Employer: [employer]  Tobacco Use   Smoking status: Never   Smokeless tobacco: Never  Vaping Use   Vaping status: Never Used  Substance and Sexual Activity   Alcohol use: No   Drug use: No   Sexual activity: Not on file  Other Topics Concern   Not on file  Social History Narrative   Not on file   Social Drivers of Health   Financial Resource Strain: Not on file  Food Insecurity: No Food Insecurity (12/06/2023)   Hunger Vital Sign    Worried About Running Out of Food in the Last Year: Never true    Ran Out of Food in the Last Year: Never true  Transportation Needs: No Transportation Needs (12/06/2023)   PRAPARE - Administrator, Civil Service (Medical): No    Lack of Transportation (Non-Medical): No  Physical Activity: Not on file  Stress: Not on file  Social Connections: Socially Integrated (12/06/2023)   Social Connection and Isolation Panel    Frequency of Communication with Friends and Family: Three times a week    Frequency of Social Gatherings with Friends and Family: Three times a week    Attends Religious Services: More than 4 times per year    Active Member of Clubs or Organizations: Yes    Attends Banker Meetings: More than 4 times per year    Marital Status: Married  Catering manager Violence: Not At Risk (12/06/2023)   Humiliation, Afraid, Rape, and Kick questionnaire    Fear of Current or Ex-Partner: No    Emotionally Abused: No     Physically Abused: No    Sexually Abused: No    Review of Systems: As per HPI, all others negative  Physical Exam: Vital signs in last 24 hours: Temp:  [98.1 F (36.7 C)-98.3 F (36.8 C)] 98.3 F (36.8 C) (08/18 2340) Pulse Rate:  [71-89] 76 (08/19 1145) Resp:  [0-32] 15 (08/19 1145) BP: (94-150)/(60-123) 141/80 (08/19 1145) SpO2:  [79 %-100 %] 97 % (08/19 1145) Weight:  [88.7 kg]  88.7 kg (08/19 1047) Last BM Date : 12/14/23 General:   Alert,  Well-developed, well-nourished, pleasant and cooperative in NAD Head:  Normocephalic and atraumatic. Eyes:  Sclera clear, no icterus.   Conjunctiva pink. Ears:  Normal auditory acuity. Nose:  Nasogastric tube in place, No deformity, discharge,  or lesions. Mouth:  No deformity or lesions.  Oropharynx pink & moist. Neck:  Supple; no masses or thyromegaly. Lungs:  No visible respiratory distress Abdomen:  Soft, nontender, mild distended with tympany. No masses, hepatosplenomegaly or hernias noted. No guarding, and without rebound.     Msk:  Symmetrical without gross deformities. Normal posture. Pulses:  Normal pulses noted. Extremities:  Without clubbing or edema. Neurologic:  Alert and  oriented x4;  grossly normal neurologically. Skin:  Intact without significant lesions or rashes. Psych:  Alert and cooperative. Normal mood and affect.   Lab Results: Recent Labs    12/12/23 0406 12/13/23 0313 12/14/23 0858  WBC 8.0 10.9* 13.7*  HGB 14.5 14.5 13.2  HCT 42.6 44.1 41.9  PLT 307 335 320   BMET Recent Labs    12/13/23 0313 12/13/23 1934 12/14/23 0249  NA 142 146* 147*  K 3.3* 3.5 3.4*  CL 89* 93* 95*  CO2 35* 37* 38*  GLUCOSE 130* 121* 114*  BUN 104* 90* 74*  CREATININE 3.06* 2.28* 2.00*  CALCIUM  8.1* 8.2* 8.3*   LFT Recent Labs    12/14/23 0249  ALBUMIN  2.2*   PT/INR No results for input(s): LABPROT, INR in the last 72 hours.  Studies/Results: DG CHEST PORT 1 VIEW Result Date: 12/14/2023 CLINICAL DATA:   200808 Hypoxia [id number] EXAM: PORTABLE CHEST 1 VIEW COMPARISON:  Radiographs 12/10/2023 and 12/06/2023.  CT 12/08/2023. FINDINGS: 1017 hours. Mild patient rotation to the right. Enteric tube has been placed, projecting below the diaphragm, tip overlying the mid stomach. The heart size and mediastinal contours are stable with aortic atherosclerosis. No significant change in small left pleural effusion and bibasilar airspace opacities. No evidence of pneumothorax. The bones appear unchanged. IMPRESSION: 1. Interval placement of enteric tube, tip overlying the mid stomach. 2. No significant change in small left pleural effusion and bibasilar airspace opacities. Electronically Signed   By: Elsie Perone M.D.   On: 12/14/2023 11:05   US  EKG SITE RITE Result Date: 12/14/2023 If Site Rite image not attached, placement could not be confirmed due to current cardiac rhythm.  DG Abd 1 View Result Date: 12/14/2023 CLINICAL DATA:  Ileus EXAM: ABDOMEN - 1 VIEW COMPARISON:  12/13/2023 FINDINGS: NG tube tip is in the stomach. Interval decrease in gaseous distention of the stomach. Diffuse gaseous distention of small bowel evident with small bowel loops measuring up to 4.4 cm diameter in the mid abdomen, similar time minimally progressive in the interval. Degenerative changes are noted in the lumbar spine and both hips. IMPRESSION: 1. Interval decrease in gaseous distention of the stomach. 2. Persistent diffuse gaseous distention of small bowel, similar to minimally progressive in the interval. Electronically Signed   By: Camellia Candle M.D.   On: 12/14/2023 07:09   DG Abd 1 View Result Date: 12/13/2023 CLINICAL DATA:  Ileus. EXAM: ABDOMEN - 1 VIEW COMPARISON:  Radiographs 12/12/2023 and 12/11/2023.  CT 12/08/2023. FINDINGS: 0800 hours. Two supine views of the abdomen are submitted. Enteric tube projects over the mid stomach. Interval increased gaseous distention of the stomach. Multiple mildly dilated loops of small bowel  have not significantly changed. The colon appears relatively decompressed. No supine evidence of pneumoperitoneum.  No suspicious abdominal calcifications or acute osseous findings. IMPRESSION: Interval increased gaseous distention of the stomach with persistent mildly dilated loops of small bowel, most consistent with an ileus based on recent prior imaging. Correlate clinically. Electronically Signed   By: Elsie Perone M.D.   On: 12/13/2023 10:58    Impression:   Ileus.  Small bowel.  Likely from #2 below.  No obstruction  Aortic dissection with intramural hematoma.  Plan:   Continue NGT suction. Continue dulcolax suppositories. Neostigmine  is typically used Ogilvie's (colonic pseudoobstruction); I am not aware of its use in small bowel ileus. Metoclopramide  has limited effectiveness as well, and you'd need to watch closely for extrapyramidal side effects (tardive dyskinesia). Since ileus is small bowel bowel in nature, there is no utility of rectal tube. OOBTC, ambulate as tolerated. Minimize narcotics. IVF. Ideally keep potassium level around 4. Overall, patient is slowly improving and I suspect it will take several more days (if not longer) before ileus resolves. No further recommendations from GI perspective; [facility] will sign-off; please call with questions; thank you for the consultation.   LOS: 8 days   Devontay Celaya M  12/14/2023, 12:11 PM  Cell [phone] If no answer or after 5 PM call [phone]

## 2023-12-14 NOTE — Progress Notes (Signed)
 Peripherally Inserted Central Catheter Placement  The IV Nurse has discussed with the patient and/or persons authorized to consent for the patient, the purpose of this procedure and the potential benefits and risks involved with this procedure.  The benefits include less needle sticks, lab draws from the catheter, and the patient may be discharged home with the catheter. Risks include, but not limited to, infection, bleeding, blood clot (thrombus formation), and puncture of an artery; nerve damage and irregular heartbeat and possibility to perform a PICC exchange if needed/ordered by physician.  Alternatives to this procedure were also discussed.  Bard Power PICC patient education guide, fact sheet on infection prevention and patient information card has been provided to patient /or left at bedside.    PICC Placement Documentation  PICC Triple Lumen 12/14/23 Left Basilic 42 cm 0 cm (Active)  Indication for Insertion or Continuance of Line Administration of hyperosmolar/irritating solutions (i.e. TPN, Vancomycin, etc.) 12/14/23 1400  Exposed Catheter (cm) 0 cm 12/14/23 1400  Site Assessment Clean, Dry, Intact 12/14/23 1400  Lumen #1 Status Flushed;Saline locked;Blood return noted 12/14/23 1400  Lumen #2 Status Flushed;Saline locked;Blood return noted 12/14/23 1400  Lumen #3 Status Flushed;Saline locked;Blood return noted 12/14/23 1400  Dressing Type Transparent;Securing device 12/14/23 1400  Dressing Status Antimicrobial disc/dressing in place 12/14/23 1400  Line Care Connections checked and tightened 12/14/23 1400  Line Adjustment (NICU/IV Team Only) No 12/14/23 1400  Dressing Intervention New dressing;Adhesive placed at insertion site (IV team only) 12/14/23 1400  Dressing Change Due 12/21/23 12/14/23 1400       Nicholas Burke 12/14/2023, 3:14 PM

## 2023-12-15 DIAGNOSIS — E440 Moderate protein-calorie malnutrition: Secondary | ICD-10-CM | POA: Diagnosis not present

## 2023-12-15 DIAGNOSIS — I71012 Dissection of descending thoracic aorta: Secondary | ICD-10-CM | POA: Diagnosis not present

## 2023-12-15 DIAGNOSIS — K567 Ileus, unspecified: Secondary | ICD-10-CM | POA: Diagnosis not present

## 2023-12-15 DIAGNOSIS — J9601 Acute respiratory failure with hypoxia: Secondary | ICD-10-CM | POA: Diagnosis not present

## 2023-12-15 LAB — BASIC METABOLIC PANEL WITH GFR
Anion gap: 11 (ref 5–15)
Anion gap: 9 (ref 5–15)
BUN: 34 mg/dL — ABNORMAL HIGH (ref 8–23)
BUN: 25 mg/dL — ABNORMAL HIGH (ref 8–23)
CO2: 31 mmol/L (ref 22–32)
CO2: 29 mmol/L (ref 22–32)
Calcium: 8.3 mg/dL — ABNORMAL LOW (ref 8.9–10.3)
Calcium: 8.8 mg/dL — ABNORMAL LOW (ref 8.9–10.3)
Chloride: 105 mmol/L (ref 98–111)
Chloride: 107 mmol/L (ref 98–111)
Creatinine, Ser: 1.37 mg/dL — ABNORMAL HIGH (ref 0.61–1.24)
Creatinine, Ser: 1.32 mg/dL — ABNORMAL HIGH (ref 0.61–1.24)
GFR, Estimated: 53 mL/min — ABNORMAL LOW (ref >60)
GFR, Estimated: 56 mL/min — ABNORMAL LOW (ref >60)
Glucose, Bld: 158 mg/dL — ABNORMAL HIGH (ref 70–99)
Glucose, Bld: 139 mg/dL — ABNORMAL HIGH (ref 70–99)
Potassium: 3.5 mmol/L (ref 3.5–5.1)
Potassium: 3.7 mmol/L (ref 3.5–5.1)
Sodium: 147 mmol/L — ABNORMAL HIGH (ref 135–145)
Sodium: 145 mmol/L (ref 135–145)

## 2023-12-15 LAB — PHOSPHORUS
Phosphorus: 2.4 mg/dL — ABNORMAL LOW (ref 2.5–4.6)
Phosphorus: 3.2 mg/dL (ref 2.5–4.6)

## 2023-12-15 LAB — CBC
HCT: 40.6 % (ref 39.0–52.0)
Hemoglobin: 12.5 g/dL — ABNORMAL LOW (ref 13.0–17.0)
MCH: 27 pg (ref 26.0–34.0)
MCHC: 30.8 g/dL (ref 30.0–36.0)
MCV: 87.7 fL (ref 80.0–100.0)
Platelets: 341 K/uL (ref 150–400)
RBC: 4.63 MIL/uL (ref 4.22–5.81)
RDW: 14.8 % (ref 11.5–15.5)
WBC: 16.1 K/uL — ABNORMAL HIGH (ref 4.0–10.5)
nRBC: 0 % (ref 0.0–0.2)

## 2023-12-15 LAB — GLUCOSE, CAPILLARY
Glucose-Capillary: 156 mg/dL — ABNORMAL HIGH (ref 70–99)
Glucose-Capillary: 176 mg/dL — ABNORMAL HIGH (ref 70–99)
Glucose-Capillary: 166 mg/dL — ABNORMAL HIGH (ref 70–99)
Glucose-Capillary: 202 mg/dL — ABNORMAL HIGH (ref 70–99)
Glucose-Capillary: 155 mg/dL — ABNORMAL HIGH (ref 70–99)
Glucose-Capillary: 160 mg/dL — ABNORMAL HIGH (ref 70–99)

## 2023-12-15 LAB — MAGNESIUM
Magnesium: 2.6 mg/dL — ABNORMAL HIGH (ref 1.7–2.4)
Magnesium: 2.4 mg/dL (ref 1.7–2.4)

## 2023-12-15 MED ORDER — POTASSIUM PHOSPHATES 15 MMOLE/5ML IV SOLN
30.0000 mmol | Freq: Once | INTRAVENOUS | Status: AC
  Administered 2023-12-15: 30 mmol via INTRAVENOUS
  Filled 2023-12-15: qty 10

## 2023-12-15 MED ORDER — TRAVASOL 10 % IV SOLN
INTRAVENOUS | Status: AC
  Filled 2023-12-15: qty 499.2

## 2023-12-15 MED ORDER — HYDRALAZINE HCL 20 MG/ML IJ SOLN
20.0000 mg | Freq: Four times a day (QID) | INTRAMUSCULAR | Status: DC
  Administered 2023-12-15 – 2023-12-16 (×5): 20 mg via INTRAVENOUS
  Filled 2023-12-15 (×4): qty 1

## 2023-12-15 MED ORDER — POTASSIUM CHLORIDE 10 MEQ/50ML IV SOLN
10.0000 meq | INTRAVENOUS | Status: AC
  Administered 2023-12-15 (×4): 10 meq via INTRAVENOUS
  Filled 2023-12-15 (×4): qty 50

## 2023-12-15 MED ORDER — HEPARIN SODIUM (PORCINE) 5000 UNIT/ML IJ SOLN
5000.0000 [IU] | Freq: Three times a day (TID) | INTRAMUSCULAR | Status: DC
  Administered 2023-12-15 – 2023-12-20 (×15): 5000 [IU] via SUBCUTANEOUS
  Filled 2023-12-15 (×16): qty 1

## 2023-12-15 NOTE — Progress Notes (Signed)
 Renal artery duplex ultrasound reviewed.  Normal. Creatinine improving. Will continue to manage the intramural hematoma conservatively.

## 2023-12-15 NOTE — Progress Notes (Signed)
 Nutrition Follow-up  DOCUMENTATION CODES:   Non-severe (moderate) malnutrition in context of acute illness/injury  INTERVENTION:  ***   NUTRITION DIAGNOSIS:   Moderate Malnutrition related to acute illness (Type B aortic dissection with hematoma causing prolonged SB ileus) as evidenced by energy intake < 75% for > 7 days, mild fat depletion, mild muscle depletion.  ***  GOAL:   Patient will meet greater than or equal to 90% of their needs  ***  MONITOR:   Diet advancement, Weight trends, Labs, Skin, I & O's (TPN)  REASON FOR ASSESSMENT:   Consult New TPN/TNA  ASSESSMENT:   78 yo male admitted with type B aortic dissection with intramural hematoma likely secondary to poorly controlled HTN; +HTN emergency requiring cleviprex . +AKI. Pt developed severe ileus due to dissection.   PMH includes DM, HTN, prostate cancer  ***   NUTRITION - FOCUSED PHYSICAL EXAM:  {RD Focused Exam List:21252}  Diet Order:   Diet Order             Diet NPO time specified  Diet effective now                   EDUCATION NEEDS:   Education needs have been addressed  Skin:  Skin Assessment: Reviewed RN Assessment  Last BM:  8/19 type 1 small  Height:   Ht Readings from Last 1 Encounters:  12/06/23 5' 10 (1.778 m)    Weight:   Wt Readings from Last 1 Encounters:  12/15/23 89.8 kg    Ideal Body Weight:     BMI:  Body mass index is 28.41 kg/m.  Estimated Nutritional Needs:   Kcal:  1900-2100 kcals  Protein:  95-105 g  Fluid:  >/= 2L (not taking into account high NG output)    ***

## 2023-12-15 NOTE — Progress Notes (Addendum)
 NAME:  Nicholas Burke, MRN:  [id number], DOB:  03-26-1946, LOS: 9 ADMISSION DATE:  12/06/2023, CONSULTATION DATE:  12/06/2023 REFERRING MD: Dr. Celinda - TRH, CHIEF COMPLAINT: Intramural hematoma  History of Present Illness:  Nicholas Burke is a 78 year old male with a past medical history significant for prediabetes, hypertension, and prostate cancer who presented to the ED at [facility] 8/11 for complaints of epigastric pain.  On ED arrival patient was seen hypertensive with all other vital signs within normal limit.  CT abdomen pelvis obtained and revealed extensive intramural hematoma involving the descending thoracic aorta extending throughout the abdominal aorta and into the right external iliac artery.  CTA chest again confirms intramural hematoma beginning at the transverse aorta origin of the left subclavian artery and extending throughout the thoracic aorta and into the abdominal aorta.  Lab work significant for potassium 2.9.  Vascular surgery evaluated patient in ED and recommended tight blood pressure and heart rate control, PCCM consulted in this setting.   Pertinent  Medical History  prediabetes, hypertension, and prostate cancer  Significant Hospital Events: Including procedures, antibiotic start and stop dates in addition to other pertinent events   8/11 presented with epigastric abdominal pain found to have large intramural hematoma 8/14 remains on esmolol , no indication for urgent surgical intervention 8/15 ongoing issues with ileus 8/16 NGT placed to decompress 8/19 up walking, minimal BM and continued high output from NGT; feeling a little better. Still on Cleviprex  and esmolol . Seen By GI in consult-> not much to add. Recommending: cont NGT to LIWS, bowel rest, dulcolax supps, mobilization, time, maintenance of electrolytes, minimization of narcotics. Did not feel rectal tube would be of utility d/t SB being location of concern.  8/20 new tremor stopped the reglan . Still trying to get  control of BP and HR. W/out CCB he has required freq PRNs  Interim History / Subjective:  He is intermittently nauseated.  Denies pain.  Denies shortness of breath.  Certainly frustrated in regards to the situation and his long hospitalization  Objective    Blood pressure 110/71, pulse 83, temperature 98.3 F (36.8 C), temperature source Axillary, resp. rate 18, height 5' 10 (1.778 m), weight 89.8 kg, SpO2 94%.        Intake/Output Summary (Last 24 hours) at 12/15/2023 0726 Last data filed at 12/15/2023 0630 Gross per 24 hour  Intake 5245.82 ml  Output 4375 ml  Net 870.82 ml   Filed Weights   12/11/23 0645 12/14/23 1047 12/15/23 0452  Weight: 91.2 kg 88.7 kg 89.8 kg    General This is a very pleasant 78 year old male patient he is currently lying in bed after being up in the chair this morning HEENT normal cephalic atraumatic no jugular venous distention is appreciated his nasogastric tube is in place and continues at low intermittent wall suction Pulmonary clear to auscultation, he has no accessory use.  Remains on 4 L via nasal cannula.  Chest x-ray showed low volume film on the 21st, no obvious infiltrates.  Suspect oxygen requirements being driven by abdominal distention atelectasis and hypoventilation Cardiac regular rate and rhythm Abdomen distended tympanic percussion hypoactive bowel sounds nasogastric tube continues to be too low no intermittent wall suction he has put out another 2 L of output over the last 24 hours GU clear yellow Extremities warm dry brisk cap refill no significant edema Neuro awake and oriented and without focal deficits Resolved    Lactic acidosis on 8/17 Assessment and Plan  Type B aortic dissection with  intramural hematoma, likely related to longstanding hypertension Hypertensive emergency Plan Cont esmolol  gtt @ 200, can titrate to 300; goal HR <80 and SBP goal <120, but prob need to avoid less than 100 (w/ resolving AK(), had been getting PRN  labetolol. Would prefer to avoid BP spikes Trying to avoid CCB if able as worried it will continue to exacerbate his ileus.  Change his hydralazine  to scheduled IV If we can't achieve goals w/ above will add NGT gtt. If creatinine still continues to improve can consider nitroprusside, Clonidine  would be tempting but think w/ patch would be difficult to control and also worry about rebound hypertension and tachycardia  Once we have his ileus resolved we will have several more pharmaceutical options.   Severe ileus due to dissection. Slowly improving. GI consulted. Unfortunately nothing really to add.  Plan Cont NGT to LIWS Cont dulcolax supps Ensure K > 4 Cont bowel rest Cont TPN Mobilize mobilize mobilize Prob not much benefit to neostigmine  and reglan  but will continue. Will need to be sure to watch for extrapyramidal symptoms   Acute respiratory failure with hypoxia: NGT with rising oxygen requirements rapidly overnight on 8/19, his oxygen requirements are now improving post abdominal decompression.  Portable chest x-ray on 8/19 was low volume with small effusions but no obvious infiltrates.  Suspect primary driving factor remains abdominal distention, hypoventilation and atelectasis however with nonfunctioning gastric tube on the 19th as well as rising white blood cell count cannot exclude aspiration Plan Continue to closely monitor fever curve Sending procalcitonin Repeating chest x-ray this morning Trend CBC Low threshold for empiric antibiotics for aspiration, would like to at least see the chest x-ray first  Hypokalemia Exacerbated by gastric output from NG tube Plan Will escalate chemistry monitoring, seems like we get behind Aggressive potassium replacement  AKI, prerenal versus intrarenal due to contrast induced - Happy to see he is improving. Plan Avoid nephrotoxins Ensure he remains euvolemic We will continue to aim for systolic blood pressure goal 100-120 Strict  intake output  Tremor-new  Plan Stop reglan    HLD Plan  eventually needs statin; on hold now while NPO  DM2, well-controlled -monitor; not needing insulin  Has had more difficult glycemic control over the last few hours. Plan Increasing sliding scale to resistant  Hx prostate cancer; in remission; had local radiation treatment Plan Follow-up outpatient  Hyperphosphatemia -monitor Plan  Replace    Best Practice (right click and Reselect all SmartList Selections daily)   Diet/type: TPN DVT prophylaxis: Place and maintain sequential compression device Start: 12/07/23 0758 SCDs Start: 12/06/23 1525 will d/w team feel like could place on [place] heparin     Pressure ulcers present: N/A GI prophylaxis: N/A Lines: Central line Foley:  Yes, and it is still needed Code Status:  full code Last date of multidisciplinary goals of care discussion [updated]   My cct 45 min   CRITICAL CARE Performed by: Jeralyn FORBES Banner

## 2023-12-15 NOTE — Evaluation (Signed)
 Physical Therapy Evaluation Patient Details Name: Nicholas Burke MRN: [id number] DOB: 07-20-1945 Today's Date: 12/15/2023  History of Present Illness  Pt is 78 year old presented to [facility] on  12/06/23 for Type B aortic dissection with intramural hematoma. Developed severe ileus due to dissection. Developed acute respiratory failure with hypoxia suspected due to aspiration from nonfunctioning NGT. PMH - htn, prostate CA  Clinical Impression  Pt presents to PT with decr mobility due to illness and inactivity. Expect pt will make good progress back to baseline with mobility. Will follow acutely but doubt pt will need PT after DC. Will work on progressing to amb without assistive device.          If plan is discharge home, recommend the following: Assist for transportation;Help with stairs or ramp for entrance;Assistance with cooking/housework   Can travel by private vehicle        Equipment Recommendations None recommended by PT  Recommendations for Other Services       Functional Status Assessment Patient has had a recent decline in their functional status and demonstrates the ability to make significant improvements in function in a reasonable and predictable amount of time.     Precautions / Restrictions Precautions Precautions: Other (comment) Precaution/Restrictions Comments: multiple lines/tubes Restrictions Weight Bearing Restrictions Per Provider Order: No      Mobility  Bed Mobility Overal bed mobility: Needs Assistance Bed Mobility: Rolling, Sidelying to Sit Rolling: Supervision Sidelying to sit: Min assist       General bed mobility comments: Assist to elevate trunk into sitting    Transfers Overall transfer level: Needs assistance Equipment used: Rollator (4 wheels) Transfers: Sit to/from Stand Sit to Stand: Contact guard assist           General transfer comment: Verbal cues for hand placement    Ambulation/Gait Ambulation/Gait assistance: Contact  guard assist, +2 safety/equipment Gait Distance (Feet): 375 Feet Assistive device: Rollator (4 wheels) Gait Pattern/deviations: Step-through pattern, Decreased stride length Gait velocity: decr Gait velocity interpretation: 1.31 - 2.62 ft/sec, indicative of limited community ambulator   General Gait Details: Assist for safety and lines/tubes  Stairs            Wheelchair Mobility     Tilt Bed    Modified Rankin (Stroke Patients Only)       Balance Overall balance assessment: Mild deficits observed, not formally tested                                           Pertinent Vitals/Pain Pain Assessment Pain Assessment: No/denies pain    Home Living Family/patient expects to be discharged to:: Private residence Living Arrangements: Spouse/significant other Available Help at Discharge: Family;Available 24 hours/day Type of Home: House Home Access: Stairs to enter Entrance Stairs-Rails: None Entrance Stairs-Number of Steps: 1   Home Layout: One level Home Equipment: None      Prior Function Prior Level of Function : Independent/Modified Independent                     Extremity/Trunk Assessment   Upper Extremity Assessment Upper Extremity Assessment: Overall WFL for tasks assessed    Lower Extremity Assessment Lower Extremity Assessment: Generalized weakness       Communication   Communication Communication: No apparent difficulties    Cognition Arousal: Alert Behavior During Therapy: Flat affect   PT - Cognitive impairments: No  apparent impairments                         Following commands: Intact       Cueing Cueing Techniques: Verbal cues     General Comments General comments (skin integrity, edema, etc.): VSS on 6L    Exercises     Assessment/Plan    PT Assessment Patient needs continued PT services  PT Problem List Decreased strength;Decreased activity tolerance;Decreased mobility       PT  Treatment Interventions DME instruction;Gait training;Stair training;Functional mobility training;Therapeutic activities;Therapeutic exercise;Patient/family education    PT Goals (Current goals can be found in the Care Plan section)  Acute Rehab PT Goals Patient Stated Goal: return home PT Goal Formulation: With patient Time For Goal Achievement: 12/29/23 Potential to Achieve Goals: Good    Frequency Min 2X/week     Co-evaluation               AM-PAC PT 6 Clicks Mobility  Outcome Measure Help needed turning from your back to your side while in a flat bed without using bedrails?: None Help needed moving from lying on your back to sitting on the side of a flat bed without using bedrails?: A Little Help needed moving to and from a bed to a chair (including a wheelchair)?: A Little Help needed standing up from a chair using your arms (e.g., wheelchair or bedside chair)?: A Little Help needed to walk in hospital room?: A Little Help needed climbing 3-5 steps with a railing? : A Little 6 Click Score: 19    End of Session Equipment Utilized During Treatment: Oxygen Activity Tolerance: Patient tolerated treatment well Patient left: in chair;with call bell/phone within reach;with family/visitor present Nurse Communication: Mobility status (Nurse present) PT Visit Diagnosis: Other abnormalities of gait and mobility (R26.89);Muscle weakness (generalized) (M62.81)    Time: 1101-1120 PT Time Calculation (min) (ACUTE ONLY): 19 min   Charges:   PT Evaluation $PT Eval Moderate Complexity: 1 Mod   PT General Charges $$ ACUTE PT VISIT: 1 Visit         [facility] PT Acute Rehabilitation Services Office [phone]   Rodgers ORN [facility] 12/15/2023, 11:37 AM

## 2023-12-15 NOTE — Progress Notes (Addendum)
 PHARMACY - TOTAL PARENTERAL NUTRITION CONSULT NOTE   Indication: Prolonged ileus  Patient Measurements: Height: 5' 10 (177.8 cm) Weight: 89.8 kg (197 lb 15.6 oz) (bed) IBW/kg (Calculated) : 73 TPN AdjBW (KG): 90.7 Body mass index is 28.41 kg/m. Usual Weight: 91.2 kg   Assessment: Patient is a 78 yo M admitted with aortic dissection with intramural hematoma. Patient developed severe ileus secondary to dissection.   Patient was active and had good nutrition PTA. Without nutrition for 7 days at the start of TPN. Patient at moderate risk of refeeding.   Glucose / Insulin : A1c 7.1, CBGs <180, mSSI 8u in 24h  Electrolytes: Na 147, K 3.5 (after ), Phos 2.4, Mag 2.6, Cl 105, Co2 31  Renal: Scr 1.37 (decreasing) BUN 34 Hepatic:  Intake / Output; MIVF: UOP 1.1 ml/kg/min, net -1L since admission, NG set to decompress -2L, LR running @75 /hr in addition to TPN  GI Imaging:  8/11- Extensive intramural hematoma involving the descending thoracic aorta extending throughout the abdominal aorta and into the right inflow vessels terminating in the mid right external iliac artery. 8/14- There is increased amount of gas throughout the small bowel and colon, likely manifestation of adynamic ileus 8/17- Persistent but improved gaseous gastric distension  8/19 - unchanged  GI Surgeries / Procedures:   Central access: PICC  TPN start date: 8/19  Nutritional Goals: Goal TPN rate is 80 mL/hr (provides 100 g of protein and 1994 kcals per day)  RD Assessment: Estimated Needs Total Energy Estimated Needs: 1900-2100 kcals Total Protein Estimated Needs: 95-105 g Total Fluid Estimated Needs: >/= 2L (not taking into account high NG output)  Current Nutrition:  NPO + LR @75 /hr x 1 day   Plan:  Continue TPN at 40 mL/hr at 1800 due to refeeding which provides 50% of estimated daily caloric needs  Electrolytes in TPN: Na 0 mEq/L, K 55mEq/L, Ca 43mEq/L, Mg 3Eq/L, and increase Phos 20mmol/L. Cl:Ac max  chloride (calculates to 1:1.1 ratio)  Potassium 40 mEq IV + Kphos 30 mmol replaced per primary Will repeat Bmet/phos levels at 2000  Add standard MVI and trace elements to TPN Continue Moderate q4h SSI and adjust as needed  MIVF 10 mL/hr at 1800 Thiamine  x 5 days IV per discussion with RD  Monitor TPN labs on Mon/Thurs  Nicholas Burke M Delbra Zellars 12/15/2023,10:39 AM

## 2023-12-16 DIAGNOSIS — I7100 Dissection of unspecified site of aorta: Secondary | ICD-10-CM | POA: Diagnosis not present

## 2023-12-16 DIAGNOSIS — G9340 Encephalopathy, unspecified: Secondary | ICD-10-CM | POA: Diagnosis not present

## 2023-12-16 DIAGNOSIS — I959 Hypotension, unspecified: Secondary | ICD-10-CM

## 2023-12-16 DIAGNOSIS — K567 Ileus, unspecified: Secondary | ICD-10-CM | POA: Diagnosis not present

## 2023-12-16 DIAGNOSIS — E440 Moderate protein-calorie malnutrition: Secondary | ICD-10-CM | POA: Diagnosis not present

## 2023-12-16 DIAGNOSIS — J9601 Acute respiratory failure with hypoxia: Secondary | ICD-10-CM | POA: Diagnosis not present

## 2023-12-16 LAB — COMPREHENSIVE METABOLIC PANEL WITH GFR
ALT: 17 U/L (ref 0–44)
AST: 28 U/L (ref 15–41)
Albumin: 2 g/dL — ABNORMAL LOW (ref 3.5–5.0)
Alkaline Phosphatase: 44 U/L (ref 38–126)
Anion gap: 11 (ref 5–15)
BUN: 23 mg/dL (ref 8–23)
CO2: 28 mmol/L (ref 22–32)
Calcium: 8.9 mg/dL (ref 8.9–10.3)
Chloride: 105 mmol/L (ref 98–111)
Creatinine, Ser: 1.29 mg/dL — ABNORMAL HIGH (ref 0.61–1.24)
GFR, Estimated: 57 mL/min — ABNORMAL LOW (ref >60)
Glucose, Bld: 111 mg/dL — ABNORMAL HIGH (ref 70–99)
Potassium: 3.2 mmol/L — ABNORMAL LOW (ref 3.5–5.1)
Sodium: 144 mmol/L (ref 135–145)
Total Bilirubin: 1 mg/dL (ref 0.0–1.2)
Total Protein: 5.3 g/dL — ABNORMAL LOW (ref 6.5–8.1)

## 2023-12-16 LAB — GLUCOSE, CAPILLARY
Glucose-Capillary: 129 mg/dL — ABNORMAL HIGH (ref 70–99)
Glucose-Capillary: 146 mg/dL — ABNORMAL HIGH (ref 70–99)
Glucose-Capillary: 146 mg/dL — ABNORMAL HIGH (ref 70–99)
Glucose-Capillary: 152 mg/dL — ABNORMAL HIGH (ref 70–99)
Glucose-Capillary: 156 mg/dL — ABNORMAL HIGH (ref 70–99)
Glucose-Capillary: 169 mg/dL — ABNORMAL HIGH (ref 70–99)

## 2023-12-16 LAB — MAGNESIUM: Magnesium: 2.3 mg/dL (ref 1.7–2.4)

## 2023-12-16 LAB — PHOSPHORUS: Phosphorus: 2.9 mg/dL (ref 2.5–4.6)

## 2023-12-16 MED ORDER — LABETALOL HCL 5 MG/ML IV SOLN
0.0000 mg/min | Status: DC
  Administered 2023-12-16: 2 mg/min via INTRAVENOUS
  Administered 2023-12-17 (×2): 0.5 mg/min via INTRAVENOUS
  Administered 2023-12-18 – 2023-12-19 (×2): 1 mg/min via INTRAVENOUS
  Administered 2023-12-20: 0.5 mg/min via INTRAVENOUS
  Administered 2023-12-21: 2 mg/min via INTRAVENOUS
  Administered 2023-12-21 – 2023-12-22 (×2): 0.5 mg/min via INTRAVENOUS
  Administered 2023-12-22: 1 mg/min via INTRAVENOUS
  Administered 2023-12-23 – 2023-12-24 (×2): 1.5 mg/min via INTRAVENOUS
  Administered 2023-12-25: 0.5 mg/min via INTRAVENOUS
  Administered 2023-12-25: 2 mg/min via INTRAVENOUS
  Administered 2023-12-26: 0.5 mg/min via INTRAVENOUS
  Filled 2023-12-16: qty 180
  Filled 2023-12-16 (×10): qty 200
  Filled 2023-12-16: qty 180
  Filled 2023-12-16 (×2): qty 200

## 2023-12-16 MED ORDER — PHENYLEPHRINE 80 MCG/ML (10ML) SYRINGE FOR IV PUSH (FOR BLOOD PRESSURE SUPPORT)
PREFILLED_SYRINGE | INTRAVENOUS | Status: AC
Start: 2023-12-16 — End: 2023-12-16
  Administered 2023-12-16: 160 ug
  Filled 2023-12-16: qty 10

## 2023-12-16 MED ORDER — POTASSIUM CHLORIDE 10 MEQ/50ML IV SOLN
10.0000 meq | INTRAVENOUS | Status: AC
  Administered 2023-12-16 (×4): 10 meq via INTRAVENOUS
  Filled 2023-12-16 (×4): qty 50

## 2023-12-16 MED ORDER — HYDRALAZINE HCL 20 MG/ML IJ SOLN
10.0000 mg | INTRAMUSCULAR | Status: DC | PRN
  Administered 2023-12-16: 40 mg via INTRAVENOUS
  Administered 2023-12-16: 20 mg via INTRAVENOUS
  Filled 2023-12-16: qty 1
  Filled 2023-12-16: qty 2

## 2023-12-16 MED ORDER — CLONIDINE HCL 0.2 MG/24HR TD PTWK
0.2000 mg | MEDICATED_PATCH | TRANSDERMAL | Status: DC
  Administered 2023-12-16: 0.2 mg via TRANSDERMAL
  Filled 2023-12-16: qty 1

## 2023-12-16 MED ORDER — TRAVASOL 10 % IV SOLN
INTRAVENOUS | Status: AC
  Filled 2023-12-16: qty 748.8

## 2023-12-16 MED ORDER — LACTATED RINGERS IV SOLN: INTRAVENOUS | Status: AC

## 2023-12-16 NOTE — Progress Notes (Signed)
 Unresponsive, hypotensive. Code stroke called.  Moved from chair back to bed, able to open eyes and answering with1-2 word answers initially with MAP ~50.  Clonidine  patch removed, labetalol  held. 1cc Neosynephrine pushed with response in BP and some improvemnet in mentation. Able to squeeze both hands, wiggle all toes, uncross legs, hold arms off the bed against gravity.  Able to answer questions with longer answers, not always accurate. Additional 1cc of Neosynephrine given.   Goal SBP 100-120 May need neosynephrine IV short-term D/c hydralazine  When SBP sustained >120 again, can restart labetalol  gtt.  Dose adjustments made. D/c clonidine  patch  D/w Dr. Jerri at bedside. Not TNK candidate due to dissection.Only on DVT prophylaxis, so not high risk for bleed. Nonfocal exam, so less likely CTO and would be high risk for endovascular intervention with dissection.   Daughter Meade at bedside throughout and updated after.  Additional cc time: 25 min  Leita SHAUNNA Gaskins, DO 12/16/23 4:24 PM [facility]  For contact information, see Amion. If no response to pager, please call PCCM consult pager. After hours, 7PM- 7AM, please call Elink.

## 2023-12-16 NOTE — Code Documentation (Addendum)
 Stroke Response Nurse Documentation Code Documentation  Nicholas Burke is a 78 y.o. male admitted to [facility]  on 12/06/2023 for Aortic Dissection with past medical hx of HTN, HLP, . Code stroke was activated by [facility] Staff .   Patient on 2H unit where he was LKW at 1430 and now complaining of altered mental status. Pt had been in chair at his baseline. When staff came in to take patient for a walk, he was noted to be altered with hypotension.   Stroke team at the bedside after patient activation. Patient to CT with team. NIHSS 6, see documentation for details and code stroke times. Patient with right lower quadrantanopia, altered mental status, generalized weakness on exam. The patient was not taken to imaging at this time due to being unstable with BP management. Patient is not a candidate for IV Thrombolytic due to aortic dissection.  Care/Plan: q1 NIHSS x 6 Hours per MD Jerri.   Process Delays Noted: Complicated Case w/ Hypotension  Bedside handoff with RN [place].    Carolee Chiquita RAMAN  Stroke Response RN

## 2023-12-16 NOTE — Progress Notes (Signed)
 NAME:  Nicholas Burke, MRN:  [id number], DOB:  07/03/1945, LOS: 10 ADMISSION DATE:  12/06/2023, CONSULTATION DATE:  12/06/2023 REFERRING MD: Dr. Celinda - TRH, CHIEF COMPLAINT: Intramural hematoma  History of Present Illness:  Nicholas Burke is a 78 year old male with a past medical history significant for prediabetes, hypertension, and prostate cancer who presented to the ED at [facility] 8/11 for complaints of epigastric pain.  On ED arrival patient was seen hypertensive with all other vital signs within normal limit.  CT abdomen pelvis obtained and revealed extensive intramural hematoma involving the descending thoracic aorta extending throughout the abdominal aorta and into the right external iliac artery.  CTA chest again confirms intramural hematoma beginning at the transverse aorta origin of the left subclavian artery and extending throughout the thoracic aorta and into the abdominal aorta.  Lab work significant for potassium 2.9.  Vascular surgery evaluated patient in ED and recommended tight blood pressure and heart rate control, PCCM consulted in this setting.   Pertinent  Medical History  prediabetes, hypertension, and prostate cancer  Significant Hospital Events: Including procedures, antibiotic start and stop dates in addition to other pertinent events   8/11 presented with epigastric abdominal pain found to have large intramural hematoma 8/14 remains on esmolol , no indication for urgent surgical intervention 8/15 ongoing issues with ileus 8/16 NGT placed to decompress 8/19 continued high output from NGT. Still on Cleviprex  and esmolol . Seen By GI in consult. Stopped CCB due to risk of constipation. Started TPN, PICC placed. 8/20 new tremor stopped the reglan . Still trying to get control of BP and HR. Started scheduled IV hydral + esmolol .   Interim History / Subjective:  Feels about the same. Has still been walking, chewing gum. Doing ok with ice chips.  Objective    Blood pressure  120/75, pulse 83, temperature 99.4 F (37.4 C), temperature source Oral, resp. rate (!) 22, height 5' 10 (1.778 m), weight 89.5 kg, SpO2 96%.        Intake/Output Summary (Last 24 hours) at 12/16/2023 1300 Last data filed at 12/16/2023 1200 Gross per 24 hour  Intake 4880.69 ml  Output 3200 ml  Net 1680.69 ml   Filed Weights   12/14/23 1047 12/15/23 0452 12/16/23 0500  Weight: 88.7 kg 89.8 kg 89.5 kg    General chronically ill appearing man sitting up in the recliner in NAD HEENT [place]/AT, eyes anicteric, NGT Resp: breathing comfortably on 6L [place], reduced basilar breath sounds Cardiac S1S2, RRR Abdomen still mildly distended, absent bowel sounds. Bilious output from NGT. Extremities mild peripheral edema, no cyanosis Neuro awake, alert, answering questions appropriately.   K+ 3.2 BUN 23 Cr 1.29    Resolved    Lactic acidosis on 8/17 Assessment and Plan   Type B aortic dissection -Impulse control- goal HR <60 and SBP <120; avoiding CCB due to risk of worsening GI dysmotility. Con't esmolol  -increase hyralazine scheduled -adding clonidine  patch -still avoiding ARB due to residual AKI -can add NTG infusion if needed; trying to avoid nitroprusside with renal dysfunction -needs OP follow up CT scans per VVS   Ileus with constipation -con't neostigmine  -con't lactulose  enema -NGT to LIWS -Con't TPN; day #3. Still needs supplemental IVF in addition to TPN due to high GI losses. -ok for ice chips and gum for stimulation of GIT  Refeeding syndrome, moderate protein energy malnutrition -slowly advancing TPN -monitor electrolytes and replete as needed   AKI, improving- likely pre-renal, possibly ATN from prolonged pre-renal status -strict I/O -renally  dose meds, avoid nephrotoxic meds   Acute respiratory failure with hypoxia-- suspect he had aspiration when NGT was not working that led to aspiration pneumonitis -low threshold to start antibiotics if he develops sputum,  fever, infiltrate on CXR -pulmonary hygiene, mobility -wean off O2 as able to maintain SpO2 >90% -NGT to LIWS until ileus resolved  Hypophosphatemia; resolved -monitor   Hypernatremia, resolved  Hypokalemia -repleted, monitor   Hyperglycemia; controlled -SSI PRN -goal BG 140-180    LUE Tremor-acute  -previously d/c reglan   HLD -holding statin until able to take PO  Hx prostate cancer; in remission; had local radiation treatment -OP follow up  Family updated at bedside during rounds.   Best Practice (right click and Reselect all SmartList Selections daily)    This patient is critically ill with multiple organ system failure which requires frequent high complexity decision making, assessment, support, evaluation, and titration of therapies. This was completed through the application of advanced monitoring technologies and extensive interpretation of multiple databases. During this encounter critical care time was devoted to patient care services described in this note for 36 minutes.  Nicholas SHAUNNA Gaskins, DO 12/16/23 2:51 PM [facility]  For contact information, see Amion. If no response to pager, please call PCCM consult pager. After hours, 7PM- 7AM, please call Elink.

## 2023-12-16 NOTE — Consult Note (Signed)
 NEUROLOGY CONSULT NOTE   Date of service: December 16, 2023 Patient Name: Nicholas Burke MRN:  [id number] DOB:  Dec 02, 1945 Chief Complaint: Altered mental status Requesting Provider: Gretta Leita SQUIBB, DO  History of Present Illness  Mardell Cragg is a 78 y.o. male with hx of prediabetes, hypertension and prostate cancer who was originally admitted with type B aortic dissection on conservative measures and he developed ileus on TPN. Given on po access, BP medication has to be adjusted today. This afternoon, while sitting in chair, he developed acute onset altered mental status in the setting of hypotension and code stroke activated.  Patient was recently started on clonidine  patch and changed to esmolol  for blood pressure control.  He was last known to be normal at 1:30 PM but now has become hypotensive and confused.  Put back to bed, given IV Neosynephrine and BP has improved and mental status improved. However, still encephalopathic not quite cooperative with neuro exam. Pt not safe for any imaging at this time given hypotension. Pt not TNK candidate due to aortic dissection and not IR candidate given no sign of LVO but also due to without good access to extra- and intracranial vessels with aortic dissection.   LKW: 1330 Modified rankin score: 3-Moderate disability-requires help but walks WITHOUT assistance IV Thrombolysis: No, aortic dissection EVT: No, no clear sign of LVO and no good access due to aortic dissection  NIHSS components Score: Comment  1a Level of Conscious 0[x]  1[]  2[]  3[]      1b LOC Questions 0[]  1[x]  2[]       1c LOC Commands 0[x]  1[]  2[]       2 Best Gaze 0[x]  1[]  2[]       3 Visual 0[]  1[x]  2[]  3[]      4 Facial Palsy 0[x]  1[]  2[]  3[]      5a Motor Arm - left 0[x]  1[]  2[]  3[]  4[]  UN[]    5b Motor Arm - Right 0[x]  1[]  2[]  3[]  4[]  UN[]    6a Motor Leg - Left 0[]  1[x]  2[]  3[]  4[]  UN[]    6b Motor Leg - Right 0[]  1[x]  2[]  3[]  4[]  UN[]    7 Limb Ataxia 0[x]  1[]  2[]  UN[]      8 Sensory  0[x]  1[]  2[]  UN[]      9 Best Language 0[x]  1[]  2[]  3[]      10 Dysarthria 0[]  1[x]  2[]  UN[]      11 Extinct. and Inattention 0[x]  1[]  2[]       TOTAL:5       ROS   Unable to ascertain due to altered mental status  Past History   Past Medical History:  Diagnosis Date   Hx of radiation therapy 01/02/13- 02/24/13   prostate 7800 cGy/40 sessions, seminal vesicles 5600 cGy/40 sessions   Hypertension    Pre-diabetes    Prostate cancer (HCC) 08/30/2012   gleason 6, vol 65.7 cc    Past Surgical History:  Procedure Laterality Date   CATARACT EXTRACTION, BILATERAL Bilateral    COLONOSCOPY WITH PROPOFOL  N/A 10/18/2014   Procedure: COLONOSCOPY WITH PROPOFOL ;  Surgeon: [name], MD;  Location: [facility];  Service: Endoscopy;  Laterality: N/A;   CRYOABLATION N/A 12/24/2020   Procedure: CRYO ABLATION PROSTATE;  Surgeon: [name], MD;  Location: [facility];  Service: Urology;  Laterality: N/A;   EYE SURGERY Bilateral    cataract surgery   HOT HEMOSTASIS N/A 10/18/2014   Procedure: HOT HEMOSTASIS (ARGON PLASMA COAGULATION/BICAP);  Surgeon: [name], MD;  Location: [facility];  Service: Endoscopy;  Laterality: N/A;  PROSTATE BIOPSY  08/2012    Family History: Family History  Problem Relation Age of Onset   Prostate cancer Father    Prostate cancer Brother     Social History  reports that he has never smoked. He has never used smokeless tobacco. He reports that he does not drink alcohol and does not use drugs.  No Known Allergies  Medications   Current Facility-Administered Medications:    acetaminophen  (TYLENOL ) tablet 650 mg, 650 mg, Oral, Q6H PRN, 650 mg at 12/10/23 2144 **OR** acetaminophen  (TYLENOL ) suppository 650 mg, 650 mg, Rectal, Q6H PRN, Celinda Alm Lot, MD   bisacodyl  (DULCOLAX) suppository 10 mg, 10 mg, Rectal, Daily PRN, Gleason, Laura R, PA-C, 10 mg at 12/11/23 9065   Chlorhexidine  Gluconate Cloth 2 % PADS 6 each, 6 each, Topical, Daily,  Alghanim, Fahid, MD, 6 each at 12/16/23 0930   heparin  injection 5,000 Units, 5,000 Units, Subcutaneous, Q8H, Babcock, Peter E, NP, 5,000 Units at 12/16/23 1307   hydrALAZINE  (APRESOLINE ) injection 10-40 mg, 10-40 mg, Intravenous, Q4H PRN, Bowser, Grace E, NP, 40 mg at 12/16/23 1307   insulin  aspart (novoLOG ) injection 0-15 Units, 0-15 Units, Subcutaneous, Q4H, Gretta Leita SQUIBB, DO, 3 Units at 12/16/23 1606   labetalol  (NORMODYNE ) infusion 5 mg/mL, 0-5 mg/min, Intravenous, Titrated, Gretta Leita SQUIBB, DO, Stopped at 12/16/23 1545   lactated ringers  infusion, , Intravenous, Continuous, Gretta Leita SQUIBB, DO, Last Rate: 40 mL/hr at 12/16/23 1600, Infusion Verify at 12/16/23 1600   lactulose  (CHRONULAC ) enema 200 gm, 300 mL, Rectal, Daily, Gretta Leita P, DO, 300 mL at 12/16/23 0930   neostigmine  (BLOXIVERZ ) injection 0.25 mg, 0.25 mg, Subcutaneous, Q6H, Gretta Leita P, DO, 0.25 mg at 12/16/23 1519   ondansetron  (ZOFRAN ) tablet 4 mg, 4 mg, Oral, Q6H PRN, 4 mg at 12/07/23 0729 **OR** ondansetron  (ZOFRAN ) injection 4 mg, 4 mg, Intravenous, Q6H PRN, Celinda Alm Lot, MD, 4 mg at 12/09/23 1845   Oral care mouth rinse, 15 mL, Mouth Rinse, PRN, Alghanim, Fahid, MD   phenol (CHLORASEPTIC) mouth spray 1 spray, 1 spray, Mouth/Throat, PRN, Gretta Leita SQUIBB, DO, 1 spray at 12/14/23 1126   potassium chloride  10 mEq in 50 mL *CENTRAL LINE* IVPB, 10 mEq, Intravenous, Q1 Hr x 4, Gretta Leita SQUIBB, DO, Last Rate: 50 mL/hr at 12/16/23 1600, Infusion Verify at 12/16/23 1600   sodium chloride  flush (NS) 0.9 % injection 10-40 mL, 10-40 mL, Intracatheter, Q12H, Clark, Laura P, DO, 10 mL at 12/16/23 0930   sodium chloride  flush (NS) 0.9 % injection 10-40 mL, 10-40 mL, Intracatheter, PRN, Gretta Leita SQUIBB, DO   thiamine  (VITAMIN B1) injection 100 mg, 100 mg, Intravenous, Daily, Gretta Leita P, DO, 100 mg at 12/16/23 1011   TPN ADULT (ION), , Intravenous, Continuous TPN, Gretta Leita SQUIBB, DO, Last Rate: 40 mL/hr at 12/16/23 1600, Infusion  Verify at 12/16/23 1600   TPN ADULT (ION), , Intravenous, Continuous TPN, Gretta Leita SQUIBB, DO  Vitals   Vitals:   12/16/23 1606 12/16/23 1607 12/16/23 1608 12/16/23 1609  BP: 109/62     Pulse: 78 79 78 78  Resp: (!) 32 (!) 37 (!) 22 (!) 42  Temp:      TempSrc:      SpO2: 94% 94% 94% 94%  Weight:      Height:        Body mass index is 28.31 kg/m.   Physical Exam   Constitutional: Ill-appearing elderly patient in no acute distress Psych: Affect appropriate to situation.  Eyes: No  scleral injection.  HENT: No OP obstruction.  Head: Normocephalic.  Cardiovascular: Normal rate and regular rhythm.  Respiratory: Effort normal, non-labored breathing.  Skin: WDI.   Neurologic Examination    NEURO:  Mental Status: AA&Ox3  Speech/Language: speech is without dysarthria or aphasia.  Naming, repetition, fluency, and comprehension intact.  Cranial Nerves:  II: PERRL.  Blinks to threat more briskly on the left than on the right III, IV, VI: EOMI. Eyelids elevate symmetrically.  V: Sensation is intact to light touch and symmetrical to face.  VII: Smile is symmetrical.  VIII: hearing intact to voice. IX, X: Voice is slightly dysarthric XII: tongue is midline without fasciculations. Motor: Able to move all 4 extremities with antigravity strength, drift present in bilateral lower extremities Tone: is normal and bulk is normal Sensation- Intact to light touch bilaterally.  Coordination: FTN intact bilaterally Gait- deferred   Labs/Imaging/Neurodiagnostic studies   CBC:  Recent Labs  Lab 01/04/24 0858 12/15/23 0504  WBC 13.7* 16.1*  HGB 13.2 12.5*  HCT 41.9 40.6  MCV 84.0 87.7  PLT 320 341   Basic Metabolic Panel:  Lab Results  Component Value Date   NA 144 12/16/2023   K 3.2 (L) 12/16/2023   CO2 28 12/16/2023   GLUCOSE 111 (H) 12/16/2023   BUN 23 12/16/2023   CREATININE 1.29 (H) 12/16/2023   CALCIUM  8.9 12/16/2023   GFRNONAA 57 (L) 12/16/2023   GFRAA >60  02/16/2019   Lipid Panel: No results found for: LDLCALC HgbA1c:  Lab Results  Component Value Date   HGBA1C 7.1 (H) 12/06/2023   Urine Drug Screen: No results found for: LABOPIA, COCAINSCRNUR, LABBENZ, AMPHETMU, THCU, LABBARB  Alcohol Level No results found for: [facility] INR  Lab Results  Component Value Date   INR 1.0 02/16/2019   CT Head without contrast(Personally reviewed): Deferred due to hypotension  ASSESSMENT   Bienvenido Proehl is a 78 y.o. male with history of prediabetes, hypertension and prostate cancer who originally presented with type B aortic dissection, has been undergoing tight blood pressure control in the ICU and who developed an ileus and is now on TPN developed altered mental status today in the setting of hypotension when his blood pressure medications were changed.  Deficits on exam were nonfocal with the exception of a questionable right visual field deficit.  However, this was difficult to assess in the setting of patient's confusion.  Due to hypotension, did not obtain CT head immediately.  He is not a candidate for TNK or mechanical thrombectomy due to the aortic dissection.  Hypotension was noted to be improving his medications were adjusted, so will reevaluate patient in about an hour and obtain CT then if he still has deficits and is hemodynamically stable.  RECOMMENDATIONS  -Reevaluate patient in about an hour, obtain CT head if deficits still present ______________________________________________________________________  Patient seen by NP with MD, MD to edit note as needed.  Signed, Cortney E Everitt Clint Kill, NP [facility]     ATTENDING NOTE: I reviewed above note and agree with the assessment and plan. Pt was seen and examined.   On my initial exam, pt lethargic, eyes closed but open on voice, but needed repetitive stimulation to keep eye opening, with eyes open, orientated to age but not to place or time. Answered I do not know for  most questions. No apparent aphasia but limited speech output, following all simple commands. Able to repeat and stated I don't know with naming. No gaze palsy, tracking bilaterally,  visual field exam not quite cooperative, question R lower quadrantanopia but not consistently blinking to visual threat bilaterally. No facial significant asymmetry. Tongue protrusion not cooperative. Bilateral UEs 4/5, no drift. Bilaterally LEs 3/5, slight drift bilaterally. Sensation not coopertive, b/l FTN with postural tremors which has been present for days unchanged, gait not tested.    NIH Stroke Scale         Level Of Consciousness 0=Alert; keenly responsive 1=Arouse to minor stimulation 2=Requires repeated stimulation to arouse or movements to pain 3=postures or unresponsive 2  LOC Questions to Month and Age 75=Answers both questions correctly 1=Answers one question correctly or dysarthria/intubated/trauma/language barrier 2=Answers neither question correctly or aphasia 1  LOC Commands      -Open/Close eyes     -Open/close grip     -Pantomime commands if communication barrier 0=Performs both tasks correctly 1=Performs one task correctly 2=Performs neighter task correctly 0  Best Gaze     -Only assess horizontal gaze 0=Normal 1=Partial gaze palsy 2=Forced deviation, or total gaze paresis 0  Visual 0=No visual loss 1=Partial hemianopia 2=Complete hemianopia 3=Bilateral hemianopia (blind including cortical blindness) 1  Facial Palsy     -Use grimace if obtunded 0=Normal symmetrical movement 1=Minor paralysis (asymmetry) 2=Partial paralysis (lower face) 3=Complete paralysis (upper and lower face) 0  Motor   0=No drift for 10/5 seconds 1=Drift, but does not hit bed 2=Some antigravity effort, hits  bed 3=No effort against gravity, limb falls 4=No movement 0=Amputation/joint fusion Right Arm 0     Leg 1    Left Arm 0     Leg 1  Limb Ataxia     - FNT/HTS 0=Absent or does not understand or  paralyzed or amputation/joint fusion 1=Present in one limb 2=Present in two limbs 0  Sensory 0=Normal 1=Mild to moderate sensory loss 2=Severe to total sensory loss or coma/unresponsive 0  Best Language 0=No aphasia, normal 1=Mild to moderate aphasia 2=Severe aphasia 3=Mute, global aphasia, or coma/unresponsive 1  Dysarthria 0=Normal 1=Mild to moderate 2=Severe, unintelligible or mute/anarthric 0=intubated/unable to test 1  Extinction/Neglect 0=No abnormality 1=visual/tactile/auditory/spatia/personal inattention/Extinction to bilateral simultaneous stimulation 2=Profound neglect/extinction more than 1 modality  2  Total     10   Put back to bed, given IV Neosynephrine and BP has improved and mental status improved. However, still encephalopathic not quite cooperative with neuro exam. Pt not safe for any imaging at this time given hypotension. Pt not TNK candidate due to aortic dissection and not IR candidate given no sign of LVO but also due to without good access to extra- and intracranial vessels with aortic dissection.   About one hour later, I came by for addition exam. Daughter is at the bedside. Pt lying in bed, still lethargic but eyes open, awake, alert, orientated to people and place, but told me age 57 instead of 44, not orientated to time. Answer questions with short words, perseveration, but following simple commands. Able to repeat but still not able to name but said I do not know. No gaze palsy, tracking bilaterally, visual field full but with perseveration but blinking to visual threat bilaterally. No facial droop. Tongue midline. Moving all extremities equally. Sensation not cooperative, b/l FTN slow but able to complete encourage, significant postural tremor, gait not tested. Noted b/l asterixis  NIH Stroke Scale  Level Of Consciousness 0=Alert; keenly responsive 1=Arouse to minor stimulation 2=Requires repeated stimulation to arouse or movements to pain 3=postures or  unresponsive 1  LOC Questions to Month and Age 75=Answers both questions correctly  1=Answers one question correctly or dysarthria/intubated/trauma/language barrier 2=Answers neither question correctly or aphasia 2  LOC Commands      -Open/Close eyes     -Open/close grip     -Pantomime commands if communication barrier 0=Performs both tasks correctly 1=Performs one task correctly 2=Performs neighter task correctly 0  Best Gaze     -Only assess horizontal gaze 0=Normal 1=Partial gaze palsy 2=Forced deviation, or total gaze paresis 0  Visual 0=No visual loss 1=Partial hemianopia 2=Complete hemianopia 3=Bilateral hemianopia (blind including cortical blindness) 0  Facial Palsy     -Use grimace if obtunded 0=Normal symmetrical movement 1=Minor paralysis (asymmetry) 2=Partial paralysis (lower face) 3=Complete paralysis (upper and lower face) 0  Motor  0=No drift for 10/5 seconds 1=Drift, but does not hit bed 2=Some antigravity effort, hits  bed 3=No effort against gravity, limb falls 4=No movement 0=Amputation/joint fusion Right Arm 0     Leg 1    Left Arm 0     Leg 1  Limb Ataxia     - FNT/HTS 0=Absent or does not understand or paralyzed or amputation/joint fusion 1=Present in one limb 2=Present in two limbs 0  Sensory 0=Normal 1=Mild to moderate sensory loss 2=Severe to total sensory loss or coma/unresponsive 0  Best Language 0=No aphasia, normal 1=Mild to moderate aphasia 2=Severe aphasia 3=Mute, global aphasia, or coma/unresponsive 1  Dysarthria 0=Normal 1=Mild to moderate 2=Severe, unintelligible or mute/anarthric 0=intubated/unable to test 1  Extinction/Neglect 0=No abnormality 1=visual/tactile/auditory/spatia/personal inattention/Extinction to bilateral simultaneous stimulation 2=Profound neglect/extinction more than 1 modality  1  Total   8    Pt BP 107/57, improved and mental status also improved, no focal deficit but still has significant encephalopathy. Given  the improvement, not concern of ICU. Not TNK candidate or IR candidate, no further neuro imaging needed at this time, and it would not change management. Will continue management per primary team. Neurology will sign off. Please call with questions. Thanks for the consult.  For detailed assessment and plan, please refer to above as I have made changes wherever appropriate.   Ary Cummins, MD PhD Stroke Neurology 12/16/2023 5:48 PM

## 2023-12-16 NOTE — Progress Notes (Addendum)
 PT Cancellation Note  Patient Details Name: Nicholas Burke MRN: [id number] DOB: 1945-10-09   Cancelled Treatment:    Reason Eval/Treat Not Completed: Other (comment)  Spoke with RN, just changed meds BP meds. Currently, MAP 57 with pt in chair. Will check back shortly to see if BP is up and mobilize as appropriate.  Addendum 1611: RN reports code stroke called after further worsening of hypotension. Holding PT for now.Will continue to follow.  Leontine Roads, PT, DPT [facility] Physical Therapist Office: [phone] Website: [email or web address]   Leontine GORMAN Roads 12/16/2023, 3:40 PM

## 2023-12-16 NOTE — Progress Notes (Signed)
 Reexamined this afternoon. More alert, keeping his eyes open, answering questions, moving extremities. Back on labetalol  gtt.  Leita SHAUNNA Gaskins, DO 12/16/23 6:20 PM [facility]  For contact information, see Amion. If no response to pager, please call PCCM consult pager. After hours, 7PM- 7AM, please call Elink.

## 2023-12-16 NOTE — Plan of Care (Incomplete)
 Pt lethargic, eyes closed but open on voice, but needed repetitive stimulation to keep eye opening, with eyes open, orientated to age but not to place or time. Answered I do not know for most questions. No apparent aphasia but limited speech output, following all simple commands. Able to repeat and stated I don't know with naming. No gaze palsy, tracking bilaterally, visual field exam not quite cooperative, question R lower quadrantanopia but not consistently blinking to visual threat bilaterally. No facial significant asymmetry. Tongue protrusion not cooperative. Bilateral UEs 4/5, no drift. Bilaterally LEs 3/5, slight drift bilaterally. Sensation not coopertive, b/l FTN with postural tremors which has been present for days unchanged, gait not tested.   NIH Stroke Scale  Level Of Consciousness 0=Alert; keenly responsive 1=Arouse to minor stimulation 2=Requires repeated stimulation to arouse or movements to pain 3=postures or unresponsive 2  LOC Questions to Month and Age 31=Answers both questions correctly 1=Answers one question correctly or dysarthria/intubated/trauma/language barrier 2=Answers neither question correctly or aphasia 1  LOC Commands      -Open/Close eyes     -Open/close grip     -Pantomime commands if communication barrier 0=Performs both tasks correctly 1=Performs one task correctly 2=Performs neighter task correctly 0  Best Gaze     -Only assess horizontal gaze 0=Normal 1=Partial gaze palsy 2=Forced deviation, or total gaze paresis 0  Visual 0=No visual loss 1=Partial hemianopia 2=Complete hemianopia 3=Bilateral hemianopia (blind including cortical blindness) 1  Facial Palsy     -Use grimace if obtunded 0=Normal symmetrical movement 1=Minor paralysis (asymmetry) 2=Partial paralysis (lower face) 3=Complete paralysis (upper and lower face) 0  Motor  0=No drift for 10/5 seconds 1=Drift, but does not hit bed 2=Some antigravity effort, hits  bed 3=No effort against  gravity, limb falls 4=No movement 0=Amputation/joint fusion Right Arm 0     Leg 1    Left Arm 0     Leg 1  Limb Ataxia     - FNT/HTS 0=Absent or does not understand or paralyzed or amputation/joint fusion 1=Present in one limb 2=Present in two limbs 0  Sensory 0=Normal 1=Mild to moderate sensory loss 2=Severe to total sensory loss or coma/unresponsive 0  Best Language 0=No aphasia, normal 1=Mild to moderate aphasia 2=Severe aphasia 3=Mute, global aphasia, or coma/unresponsive 1  Dysarthria 0=Normal 1=Mild to moderate 2=Severe, unintelligible or mute/anarthric 0=intubated/unable to test 1  Extinction/Neglect 0=No abnormality 1=visual/tactile/auditory/spatia/personal inattention/Extinction to bilateral simultaneous stimulation 2=Profound neglect/extinction more than 1 modality  2  Total   10

## 2023-12-16 NOTE — Progress Notes (Signed)
 PHARMACY - TOTAL PARENTERAL NUTRITION CONSULT NOTE   Indication: Prolonged ileus  Patient Measurements: Height: 5' 10 (177.8 cm) Weight: 89.5 kg (197 lb 5 oz) IBW/kg (Calculated) : 73 TPN AdjBW (KG): 90.7 Body mass index is 28.31 kg/Nicholas. Usual Weight: 91.2 kg   Assessment: Patient is a 78 yo Nicholas admitted with aortic dissection with intramural hematoma. Patient developed severe ileus secondary to dissection.   Patient was active and had good nutrition PTA. Without nutrition for 7 days at the start of TPN. Patient at moderate risk of refeeding.   Glucose / Insulin : A1c 7.1, CBGs <180, mSSI 20u in 24h  Electrolytes: Na 144, K 3.2 (after ), ca 8.9, Phos 2.9 (after 30 mmol), Mag 2.3, Cl 105, Co2 28 Renal: Scr 1.29 (decreasing) BUN 23 Hepatic:  Intake / Output; MIVF: UOP 0.8 ml/kg/min, net +600cc since admission, NG set to decompress -2L, LR running @75 /hr in addition to TPN  GI Imaging:  8/11 CT abd- Extensive intramural hematoma involving the descending thoracic aorta extending throughout the abdominal aorta and into the right inflow vessels terminating in the mid right external iliac artery. 8/14 DG abd- There is increased amount of gas throughout the small bowel and colon, likely manifestation of adynamic ileus 8/17 DG abd- Persistent but improved gaseous gastric distension  8/19 DG abd- unchanged  GI Surgeries / Procedures:   Central access: PICC  TPN start date: 8/19  Nutritional Goals: Goal TPN rate is 80 mL/hr (provides 100 g of protein and 1994 kcals per day)  RD Assessment: Estimated Needs Total Energy Estimated Needs: 1900-2100 kcals Total Protein Estimated Needs: 95-105 g Total Fluid Estimated Needs: >/= 2L (not taking into account high NG output)  Current Nutrition:  NPO + LR @75 /hr   Plan:  Increase TPN to 60 mL/hr at 1800 due to refeeding which provides 75% of estimated daily caloric needs  Electrolytes in TPN: Na 0 mEq/L, increase K to 65mEq/L, decrease Ca  to 65mEq/L, Mg 3Eq/L, and increase Phos 60mmol/L. Cl:Ac 1:1 KCL 40 mEq IV replaced per primary Give additional KCL 40 mEq IV  Add standard MVI and trace elements to TPN Continue Moderate q4h SSI and adjust as needed  MIVF 10 mL/hr at 1800 Thiamine  x 5 days IV per discussion with RD  Monitor TPN labs on Mon/Thurs  Nicholas Burke Nicholas Burke 12/16/2023,9:59 AM

## 2023-12-16 NOTE — Progress Notes (Signed)
 eLink Physician-Brief Progress Note Patient Name: Nicholas Burke DOB: 1946-03-16 MRN: [id number]   Date of Service  12/16/2023  HPI/Events of Note  Patient with oliguria.  eICU Interventions  In / Out catheterization ordered.        Brynnly Bonet U Hulet Ehrmann 12/16/2023, 8:30 PM

## 2023-12-17 DIAGNOSIS — E440 Moderate protein-calorie malnutrition: Secondary | ICD-10-CM | POA: Diagnosis not present

## 2023-12-17 DIAGNOSIS — I161 Hypertensive emergency: Secondary | ICD-10-CM | POA: Diagnosis not present

## 2023-12-17 DIAGNOSIS — I7103 Dissection of thoracoabdominal aorta: Secondary | ICD-10-CM

## 2023-12-17 DIAGNOSIS — E870 Hyperosmolality and hypernatremia: Secondary | ICD-10-CM

## 2023-12-17 DIAGNOSIS — K567 Ileus, unspecified: Secondary | ICD-10-CM | POA: Diagnosis not present

## 2023-12-17 LAB — MAGNESIUM: Magnesium: 2.4 mg/dL (ref 1.7–2.4)

## 2023-12-17 LAB — GLUCOSE, CAPILLARY
Glucose-Capillary: 178 mg/dL — ABNORMAL HIGH (ref 70–99)
Glucose-Capillary: 142 mg/dL — ABNORMAL HIGH (ref 70–99)
Glucose-Capillary: 165 mg/dL — ABNORMAL HIGH (ref 70–99)
Glucose-Capillary: 126 mg/dL — ABNORMAL HIGH (ref 70–99)
Glucose-Capillary: 125 mg/dL — ABNORMAL HIGH (ref 70–99)
Glucose-Capillary: 135 mg/dL — ABNORMAL HIGH (ref 70–99)

## 2023-12-17 LAB — PHOSPHORUS: Phosphorus: 3.7 mg/dL (ref 2.5–4.6)

## 2023-12-17 LAB — BASIC METABOLIC PANEL WITH GFR
Anion gap: 8 (ref 5–15)
BUN: 35 mg/dL — ABNORMAL HIGH (ref 8–23)
CO2: 29 mmol/L (ref 22–32)
Calcium: 8 mg/dL — ABNORMAL LOW (ref 8.9–10.3)
Chloride: 110 mmol/L (ref 98–111)
Creatinine, Ser: 1.94 mg/dL — ABNORMAL HIGH (ref 0.61–1.24)
GFR, Estimated: 35 mL/min — ABNORMAL LOW (ref >60)
Glucose, Bld: 177 mg/dL — ABNORMAL HIGH (ref 70–99)
Potassium: 3.7 mmol/L (ref 3.5–5.1)
Sodium: 147 mmol/L — ABNORMAL HIGH (ref 135–145)

## 2023-12-17 LAB — CBC
HCT: 35.3 % — ABNORMAL LOW (ref 39.0–52.0)
Hemoglobin: 11 g/dL — ABNORMAL LOW (ref 13.0–17.0)
MCH: 27.1 pg (ref 26.0–34.0)
MCHC: 31.2 g/dL (ref 30.0–36.0)
MCV: 86.9 fL (ref 80.0–100.0)
Platelets: 316 K/uL (ref 150–400)
RBC: 4.06 MIL/uL — ABNORMAL LOW (ref 4.22–5.81)
RDW: 15.4 % (ref 11.5–15.5)
WBC: 15.9 K/uL — ABNORMAL HIGH (ref 4.0–10.5)
nRBC: 0 % (ref 0.0–0.2)

## 2023-12-17 MED ORDER — SODIUM CHLORIDE 0.9 % IV SOLN
250.0000 mg | Freq: Four times a day (QID) | INTRAVENOUS | Status: DC
  Filled 2023-12-17 (×2): qty 5

## 2023-12-17 MED ORDER — BISACODYL 10 MG RE SUPP
10.0000 mg | Freq: Every day | RECTAL | Status: DC
  Administered 2023-12-17 – 2023-12-19 (×3): 10 mg via RECTAL
  Filled 2023-12-17 (×4): qty 1

## 2023-12-17 MED ORDER — LACTATED RINGERS IV BOLUS
2000.0000 mL | Freq: Once | INTRAVENOUS | Status: AC
  Administered 2023-12-17: 2000 mL via INTRAVENOUS

## 2023-12-17 MED ORDER — POTASSIUM CHLORIDE 10 MEQ/50ML IV SOLN
10.0000 meq | INTRAVENOUS | Status: DC
  Administered 2023-12-17: 10 meq via INTRAVENOUS
  Filled 2023-12-17 (×2): qty 50

## 2023-12-17 MED ORDER — POTASSIUM CHLORIDE 10 MEQ/50ML IV SOLN
10.0000 meq | INTRAVENOUS | Status: DC
  Administered 2023-12-17 (×4): 10 meq via INTRAVENOUS
  Filled 2023-12-17 (×3): qty 50

## 2023-12-17 MED ORDER — POTASSIUM CHLORIDE 10 MEQ/50ML IV SOLN: 10.0000 meq | INTRAVENOUS | Status: DC

## 2023-12-17 MED ORDER — SODIUM CHLORIDE 0.9 % IV SOLN
250.0000 mg | Freq: Three times a day (TID) | INTRAVENOUS | Status: AC
  Administered 2023-12-17 – 2023-12-19 (×6): 250 mg via INTRAVENOUS
  Filled 2023-12-17 (×7): qty 5

## 2023-12-17 MED ORDER — HYDRALAZINE HCL 20 MG/ML IJ SOLN
10.0000 mg | INTRAMUSCULAR | Status: DC | PRN
  Administered 2023-12-17 – 2023-12-25 (×6): 10 mg via INTRAVENOUS
  Filled 2023-12-17 (×6): qty 1

## 2023-12-17 MED ORDER — INSULIN ASPART 100 UNIT/ML IJ SOLN
0.0000 [IU] | INTRAMUSCULAR | Status: DC
  Administered 2023-12-17 (×3): 3 [IU] via SUBCUTANEOUS
  Administered 2023-12-17 – 2023-12-18 (×2): 4 [IU] via SUBCUTANEOUS
  Administered 2023-12-18: 3 [IU] via SUBCUTANEOUS
  Administered 2023-12-18: 4 [IU] via SUBCUTANEOUS
  Administered 2023-12-18 – 2023-12-19 (×2): 3 [IU] via SUBCUTANEOUS

## 2023-12-17 MED ORDER — POTASSIUM CHLORIDE 10 MEQ/50ML IV SOLN
10.0000 meq | INTRAVENOUS | Status: AC
  Administered 2023-12-17 (×2): 10 meq via INTRAVENOUS
  Filled 2023-12-17 (×2): qty 50

## 2023-12-17 MED ORDER — TRAVASOL 10 % IV SOLN
INTRAVENOUS | Status: AC
  Filled 2023-12-17: qty 998.4

## 2023-12-17 MED ORDER — PANTOPRAZOLE SODIUM 40 MG IV SOLR
40.0000 mg | Freq: Every day | INTRAVENOUS | Status: DC
  Administered 2023-12-17 – 2023-12-19 (×3): 40 mg via INTRAVENOUS
  Filled 2023-12-17 (×3): qty 10

## 2023-12-17 NOTE — Progress Notes (Signed)
 NAME:  Nicholas Burke, MRN:  [id number], DOB:  19-Sep-1945, LOS: 11 ADMISSION DATE:  12/06/2023, CONSULTATION DATE:  12/06/2023 REFERRING MD: Dr. Celinda - TRH, CHIEF COMPLAINT: Intramural hematoma  History of Present Illness:  Nicholas Burke is a 78 year old male with a past medical history significant for prediabetes, hypertension, and prostate cancer who presented to the ED at [facility] 8/11 for complaints of epigastric pain.  On ED arrival patient was seen hypertensive with all other vital signs within normal limit.  CT abdomen pelvis obtained and revealed extensive intramural hematoma involving the descending thoracic aorta extending throughout the abdominal aorta and into the right external iliac artery.  CTA chest again confirms intramural hematoma beginning at the transverse aorta origin of the left subclavian artery and extending throughout the thoracic aorta and into the abdominal aorta.  Lab work significant for potassium 2.9.  Vascular surgery evaluated patient in ED and recommended tight blood pressure and heart rate control, PCCM consulted in this setting.   Pertinent  Medical History  prediabetes, hypertension, and prostate cancer  Significant Hospital Events: Including procedures, antibiotic start and stop dates in addition to other pertinent events   8/11 presented with epigastric abdominal pain found to have large intramural hematoma 8/14 remains on esmolol , no indication for urgent surgical intervention 8/15 ongoing issues with ileus 8/16 NGT placed to decompress 8/19 continued high output from NGT. Still on Cleviprex  and esmolol . Seen By GI in consult. Stopped CCB due to risk of constipation. Started TPN, PICC placed. 8/20 new tremor stopped the reglan . Still trying to get control of BP and HR. Started scheduled IV hydral + esmolol .   Interim History / Subjective:  No overnight issues Feeling frustrated Urine output has decreased again it was 650 cc in last 24 hours, NGT output was  2.1 L Denies abdominal pain, nausea and vomiting  Objective    Blood pressure 123/71, pulse 76, temperature 98.3 F (36.8 C), temperature source Oral, resp. rate 19, height 5' 10 (1.778 m), weight 88.9 kg, SpO2 95%.        Intake/Output Summary (Last 24 hours) at 12/17/2023 0907 Last data filed at 12/17/2023 0800 Gross per 24 hour  Intake 3144.54 ml  Output 2800 ml  Net 344.54 ml   Filed Weights   12/15/23 0452 12/16/23 0500 12/17/23 0500  Weight: 89.8 kg 89.5 kg 88.9 kg   Physical exam: General: Elderly male, lying on the bed HEENT: [place]/AT, eyes anicteric.  moist mucus membranes.  NGT in place Neuro: Alert, awake following commands Chest: Coarse breath sounds, no wheezes or rhonchi Heart: Regular rate and rhythm, no murmurs or gallops Abdomen: Soft, nontender, distended, absent bowel sounds  Labs and images reviewed  Patient Lines/Drains/Airways Status     Active Line/Drains/Airways     Name Placement date Placement time Site Days   PICC Triple Lumen 12/14/23 Left Basilic 48 cm 0 cm 12/14/23  8242  -- 3   NG/OG Vented/Dual Lumen Left nare External length of tube 58 cm 12/11/23  0045  Left nare  6         Resolved    Lactic acidosis on 8/17 Hypophosphatemia/hypokalemia Assessment and Plan  Type B aortic dissection with intramural hematoma Hypertensive emergency Appreciate vascular surgery follow-up, recommend continue to manage conservatively by keeping systolic blood pressure less than 120 and heart rate less than 60 Currently on labetalol  infusion Can be changed to hydralazine  as needed   Severe ileus Patient has been on neostigmine  for almost 1 week without  much improvement Stop lactulose  enema and neostigmine  Started on Dulcolax suppository Started on erythromycin  for short period of time Continue NGT on low intermittent wall suction Okay for ice chips and chewing gum for GI stimulation Continue aggressive IV fluid resuscitation to match GI losses We  will try to keep serum potassium 4-4.5 with aggressive supplementation  Refeeding syndrome, moderate protein energy malnutrition Continue TPN   AKI, likely prerenal due to dehydration from GI losses Will give 2 L of IV fluid Monitor intake and output Avoid nephrotoxic agent Serum creatinine trended up from 1.3 now to 1.9   Acute respiratory failure with hypoxia-- suspect he had aspiration when NGT was not working that led to aspiration pneumonitis He remained afebrile, hold off on antibiotics Continue to titrate nasal cannula oxygen with O2 sat goal 92%   Hypernatremia, due to dehydration from GI losses Continue aggressive IV fluid resuscitation   Diabetes type 2 Patient's hemoglobin A1c 7.1 Continue sliding scale insulin  with CBG goal 140-180 He is also getting insulin  in TPN  Hx prostate cancer; in remission; had local radiation treatment Outpatient follow-up with oncology  Patient's wife was updated at bedside   The patient is critically ill due to type B aortic dissection/hypertensive emergency/severe ileus.  Critical care was necessary to treat or prevent imminent or life-threatening deterioration.  Critical care was time spent personally by me on the following activities: development of treatment plan with patient and/or surrogate as well as nursing, discussions with consultants, evaluation of patient's response to treatment, examination of patient, obtaining history from patient or surrogate, ordering and performing treatments and interventions, ordering and review of laboratory studies, ordering and review of radiographic studies, pulse oximetry, re-evaluation of patient's condition and participation in multidisciplinary rounds.   During this encounter critical care time was devoted to patient care services described in this note for 39 minutes.     Valinda Novas, MD [place] [facility] See Amion for pager If no response to pager, please call [phone] until  7pm After 7pm, Please call E-link [phone]

## 2023-12-17 NOTE — Progress Notes (Signed)
 Physical Therapy Treatment Patient Details Name: Nicholas Burke MRN: [id number] DOB: 08-Dec-1945 Today's Date: 12/17/2023   History of Present Illness Pt is 78 year old presented to [facility] on  12/06/23 for Type B aortic dissection with intramural hematoma. Developed severe ileus due to dissection. Developed acute respiratory failure with hypoxia suspected due to aspiration from nonfunctioning NGT. PMH - htn, prostate CA    PT Comments  Session limited by bowel incontinence each time pt stood today (small amount ~2-4oz.) RN notified. Required min assist to stand and to step pivot transfer with AD. CGA to stand from [facility] with arm rests to push up from. Assisted with peri-care. RN reports pt able to walk 2 laps around unit earlier today. Very encouraging. Suggest pt continue to mobilize with staff as able. Will follow and progress acutely. Patient will continue to benefit from skilled physical therapy services to further improve independence with functional mobility.     If plan is discharge home, recommend the following: Assist for transportation;Help with stairs or ramp for entrance;Assistance with cooking/housework;A little help with walking and/or transfers;A little help with bathing/dressing/bathroom;Supervision due to cognitive status   Can travel by private vehicle        Equipment Recommendations  Other (comment) (Possibly RW - pending progress)    Recommendations for Other Services       Precautions / Restrictions Precautions Precautions: Other (comment) Precaution/Restrictions Comments: multiple lines/tubes Restrictions Weight Bearing Restrictions Per Provider Order: No     Mobility  Bed Mobility               General bed mobility comments: in recliner    Transfers Overall transfer level: Needs assistance Equipment used: Bilateral platform walker Transfers: Sit to/from Stand, Bed to chair/wheelchair/BSC Sit to Stand: Min assist   Step pivot transfers: Min assist        General transfer comment: Min assist to stand from recilner, CGA from [facility]. Cues for hand placement with each transition. Bowel incontinence ea time. Min assist for control of Elyn walker with step pivot.    Ambulation/Gait               General Gait Details: Deferred due to bowel incontience with 2 attempts to ambulate today.   Stairs             Wheelchair Mobility     Tilt Bed    Modified Rankin (Stroke Patients Only)       Balance Overall balance assessment: Mild deficits observed, not formally tested                                          Communication Communication Communication: No apparent difficulties  Cognition Arousal: Alert Behavior During Therapy: Flat affect   PT - Cognitive impairments: No family/caregiver present to determine baseline                         Following commands: Intact      Cueing Cueing Techniques: Verbal cues  Exercises      General Comments General comments (skin integrity, edema, etc.): BP 118/70 on 8L HFNC at 95% SpO2.      Pertinent Vitals/Pain Pain Assessment Pain Assessment: No/denies pain    Home Living  Prior Function            PT Goals (current goals can now be found in the care plan section) Acute Rehab PT Goals Patient Stated Goal: return home PT Goal Formulation: With patient Time For Goal Achievement: 12/29/23 Potential to Achieve Goals: Good Progress towards PT goals: Progressing toward goals    Frequency    Min 2X/week      PT Plan      Co-evaluation              AM-PAC PT 6 Clicks Mobility   Outcome Measure  Help needed turning from your back to your side while in a flat bed without using bedrails?: None Help needed moving from lying on your back to sitting on the side of a flat bed without using bedrails?: A Little Help needed moving to and from a bed to a chair (including a wheelchair)?: A Little Help  needed standing up from a chair using your arms (e.g., wheelchair or bedside chair)?: A Little Help needed to walk in hospital room?: A Little Help needed climbing 3-5 steps with a railing? : A Little 6 Click Score: 19    End of Session Equipment Utilized During Treatment: Oxygen Activity Tolerance: Other (comment) (Bowel incontinence in standing.) Patient left: in chair;with call bell/phone within reach;with chair alarm set;with nursing/sitter in room;with family/visitor present Nurse Communication: Mobility status (Nurse present) PT Visit Diagnosis: Other abnormalities of gait and mobility (R26.89);Muscle weakness (generalized) (M62.81)     Time: 8455-8395 PT Time Calculation (min) (ACUTE ONLY): 20 min  Charges:    $Therapeutic Activity: 8-22 mins PT General Charges $$ ACUTE PT VISIT: 1 Visit                     Nicholas Burke, PT, DPT [facility]  Rehabilitation Services Physical Therapist Office: [phone] Website: [email or web address]    Nicholas Burke 12/17/2023, 4:46 PM

## 2023-12-17 NOTE — Progress Notes (Signed)
 PHARMACY - TOTAL PARENTERAL NUTRITION CONSULT NOTE   Indication: Prolonged ileus  Patient Measurements: Height: 5' 10 (177.8 cm) Weight: 88.9 kg (195 lb 15.8 oz) IBW/kg (Calculated) : 73 TPN AdjBW (KG): 90.7 Body mass index is 28.12 kg/m. Usual Weight: 91.2 kg   Assessment: Patient is a 78 yo M admitted with aortic dissection with intramural hematoma. Patient developed severe ileus secondary to dissection.   Patient was active and had good nutrition PTA. Without nutrition for 7 days at the start of TPN. Patient at moderate risk of refeeding.   Glucose / Insulin : A1c 7.1, CBGs <180, mSSI 15u in 24h  Electrolytes: Na 147, K 3.2 > 3.7 (after total with TPN, goal 4.5 per primary), CoCa 9.6, Phos 3.7, Mag 2.4, Cl 110, Co2 29 Renal: Scr 1.94 (worsening) BUN 35 Hepatic: AST/ALT wnl, T bili 1.0  Intake / Output; MIVF: UOP 0.3 ml/kg/min, net +1.3L since admission, NG set to decompress -2L, LR 2L bolus + running @40 /hr in addition to TPN  GI Imaging:  8/11 CT abd- Extensive intramural hematoma involving the descending thoracic aorta extending throughout the abdominal aorta and into the right inflow vessels terminating in the mid right external iliac artery. 8/14 DG abd- There is increased amount of gas throughout the small bowel and colon, likely manifestation of adynamic ileus 8/17 DG abd- Persistent but improved gaseous gastric distension  8/19 DG abd- unchanged  GI Surgeries / Procedures: N/A  Central access: PICC  TPN start date: 8/19  Nutritional Goals: Goal TPN rate is 80 mL/hr (provides 100 g of protein and 1994 kcals per day)  RD Assessment: Estimated Needs Total Energy Estimated Needs: 1900-2100 kcals Total Protein Estimated Needs: 95-105 g Total Fluid Estimated Needs: >/= 2L (not taking into account high NG output)  Current Nutrition:  NPO   Plan:  Increase TPN to 80 mL/hr at 1800 due to refeeding which provides 100% of estimated daily caloric needs   Electrolytes in TPN: Na 0 mEq/L, decrease K to 43mEq/L due to AKI, increase Ca to 93mEq/L, decrease Mg 25mEq/L, and decrease Phos 82mmol/L due to no Na or K in TPN. Maximum Cl:Ac ratio which calculates to 1:2 KCL 60 mEq IV replaced per primary Add standard MVI and trace elements to TPN Increase to Resistant q4h SSI and adjust as needed  Thiamine  x 5 days IV per discussion with RD  Monitor TPN labs on Mon/Thurs  Nicholas Burke 12/17/2023,10:28 AM

## 2023-12-18 DIAGNOSIS — K567 Ileus, unspecified: Secondary | ICD-10-CM | POA: Diagnosis not present

## 2023-12-18 DIAGNOSIS — N179 Acute kidney failure, unspecified: Secondary | ICD-10-CM | POA: Diagnosis not present

## 2023-12-18 DIAGNOSIS — I161 Hypertensive emergency: Secondary | ICD-10-CM | POA: Diagnosis not present

## 2023-12-18 DIAGNOSIS — I7103 Dissection of thoracoabdominal aorta: Secondary | ICD-10-CM | POA: Diagnosis not present

## 2023-12-18 LAB — BASIC METABOLIC PANEL WITH GFR
Anion gap: 4 — ABNORMAL LOW (ref 5–15)
Anion gap: 6 (ref 5–15)
Anion gap: 7 (ref 5–15)
BUN: 26 mg/dL — ABNORMAL HIGH (ref 8–23)
BUN: 30 mg/dL — ABNORMAL HIGH (ref 8–23)
BUN: 31 mg/dL — ABNORMAL HIGH (ref 8–23)
CO2: 24 mmol/L (ref 22–32)
CO2: 25 mmol/L (ref 22–32)
CO2: 27 mmol/L (ref 22–32)
Calcium: 7.6 mg/dL — ABNORMAL LOW (ref 8.9–10.3)
Calcium: 7.8 mg/dL — ABNORMAL LOW (ref 8.9–10.3)
Calcium: 7.8 mg/dL — ABNORMAL LOW (ref 8.9–10.3)
Chloride: 111 mmol/L (ref 98–111)
Chloride: 112 mmol/L — ABNORMAL HIGH (ref 98–111)
Chloride: 114 mmol/L — ABNORMAL HIGH (ref 98–111)
Creatinine, Ser: 1.32 mg/dL — ABNORMAL HIGH (ref 0.61–1.24)
Creatinine, Ser: 1.54 mg/dL — ABNORMAL HIGH (ref 0.61–1.24)
Creatinine, Ser: 1.56 mg/dL — ABNORMAL HIGH (ref 0.61–1.24)
GFR, Estimated: 46 mL/min — ABNORMAL LOW (ref >60)
GFR, Estimated: 46 mL/min — ABNORMAL LOW (ref >60)
GFR, Estimated: 56 mL/min — ABNORMAL LOW (ref >60)
Glucose, Bld: 132 mg/dL — ABNORMAL HIGH (ref 70–99)
Glucose, Bld: 173 mg/dL — ABNORMAL HIGH (ref 70–99)
Glucose, Bld: 178 mg/dL — ABNORMAL HIGH (ref 70–99)
Potassium: 3.7 mmol/L (ref 3.5–5.1)
Potassium: 3.9 mmol/L (ref 3.5–5.1)
Potassium: 3.9 mmol/L (ref 3.5–5.1)
Sodium: 142 mmol/L (ref 135–145)
Sodium: 143 mmol/L (ref 135–145)
Sodium: 145 mmol/L (ref 135–145)

## 2023-12-18 LAB — GLUCOSE, CAPILLARY
Glucose-Capillary: 118 mg/dL — ABNORMAL HIGH (ref 70–99)
Glucose-Capillary: 118 mg/dL — ABNORMAL HIGH (ref 70–99)
Glucose-Capillary: 131 mg/dL — ABNORMAL HIGH (ref 70–99)
Glucose-Capillary: 132 mg/dL — ABNORMAL HIGH (ref 70–99)
Glucose-Capillary: 155 mg/dL — ABNORMAL HIGH (ref 70–99)
Glucose-Capillary: 156 mg/dL — ABNORMAL HIGH (ref 70–99)

## 2023-12-18 LAB — RENAL FUNCTION PANEL
Albumin: 1.9 g/dL — ABNORMAL LOW (ref 3.5–5.0)
Anion gap: 6 (ref 5–15)
BUN: 29 mg/dL — ABNORMAL HIGH (ref 8–23)
CO2: 24 mmol/L (ref 22–32)
Calcium: 7.6 mg/dL — ABNORMAL LOW (ref 8.9–10.3)
Chloride: 113 mmol/L — ABNORMAL HIGH (ref 98–111)
Creatinine, Ser: 1.52 mg/dL — ABNORMAL HIGH (ref 0.61–1.24)
GFR, Estimated: 47 mL/min — ABNORMAL LOW (ref >60)
Glucose, Bld: 175 mg/dL — ABNORMAL HIGH (ref 70–99)
Phosphorus: 2.5 mg/dL (ref 2.5–4.6)
Potassium: 3.8 mmol/L (ref 3.5–5.1)
Sodium: 143 mmol/L (ref 135–145)

## 2023-12-18 LAB — MAGNESIUM: Magnesium: 2 mg/dL (ref 1.7–2.4)

## 2023-12-18 MED ORDER — POTASSIUM CHLORIDE 10 MEQ/50ML IV SOLN: 10.0000 meq | INTRAVENOUS | Status: DC

## 2023-12-18 MED ORDER — POTASSIUM CHLORIDE 10 MEQ/50ML IV SOLN
10.0000 meq | INTRAVENOUS | Status: AC
  Administered 2023-12-18 – 2023-12-19 (×6): 10 meq via INTRAVENOUS
  Filled 2023-12-18 (×6): qty 50

## 2023-12-18 MED ORDER — POTASSIUM CHLORIDE 10 MEQ/50ML IV SOLN
10.0000 meq | INTRAVENOUS | Status: AC
  Administered 2023-12-18 (×6): 10 meq via INTRAVENOUS
  Filled 2023-12-18: qty 50

## 2023-12-18 MED ORDER — TRAVASOL 10 % IV SOLN
INTRAVENOUS | Status: AC
  Filled 2023-12-18: qty 998.4

## 2023-12-18 MED ORDER — POTASSIUM CHLORIDE 10 MEQ/50ML IV SOLN
10.0000 meq | INTRAVENOUS | Status: AC
  Administered 2023-12-18 (×4): 10 meq via INTRAVENOUS
  Filled 2023-12-18 (×4): qty 50

## 2023-12-18 MED ORDER — LACTATED RINGERS IV BOLUS
2000.0000 mL | Freq: Once | INTRAVENOUS | Status: AC
  Administered 2023-12-18: 2000 mL via INTRAVENOUS

## 2023-12-18 MED ORDER — LACTATED RINGERS IV SOLN: INTRAVENOUS | Status: AC

## 2023-12-18 NOTE — Progress Notes (Signed)
 eLink Physician-Brief Progress Note Patient Name: Nicholas Burke DOB: 1945/11/13 MRN: [id number]   Date of Service  12/18/2023  HPI/Events of Note  Complaining of indigestion but NG is to suction.  Productive cough- clear but lungs sounds with crackles, febile earlier this shift but now 98.6  eICU Interventions  On pantoprazole  already, receiving TPN  N.p.o.  For now, no intervention indicated.  Came in with similar pain initially and has been experiencing it intermittently from chart review     Intervention Category Minor Interventions: Routine modifications to care plan (e.g. PRN medications for pain, fever)  Meri Pelot 12/18/2023, 11:17 PM

## 2023-12-18 NOTE — Progress Notes (Signed)
 NAME:  Nicholas Burke, MRN:  [id number], DOB:  [birth year], LOS: 12 ADMISSION DATE:  12/06/2023, CONSULTATION DATE:  12/06/2023 REFERRING MD: Dr. Celinda - TRH, CHIEF COMPLAINT: Intramural hematoma  History of Present Illness:  Nicholas Burke is a 78 year old male with a past medical history significant for prediabetes, hypertension, and prostate cancer who presented to the ED at [facility] 8/11 for complaints of epigastric pain.  On ED arrival patient was seen hypertensive with all other vital signs within normal limit.  CT abdomen pelvis obtained and revealed extensive intramural hematoma involving the descending thoracic aorta extending throughout the abdominal aorta and into the right external iliac artery.  CTA chest again confirms intramural hematoma beginning at the transverse aorta origin of the left subclavian artery and extending throughout the thoracic aorta and into the abdominal aorta.  Lab work significant for potassium 2.9.  Vascular surgery evaluated patient in ED and recommended tight blood pressure and heart rate control, PCCM consulted in this setting.   Pertinent  Medical History  prediabetes, hypertension, and prostate cancer  Significant Hospital Events: Including procedures, antibiotic start and stop dates in addition to other pertinent events   8/11 presented with epigastric abdominal pain found to have large intramural hematoma 8/14 remains on esmolol , no indication for urgent surgical intervention 8/15 ongoing issues with ileus 8/16 NGT placed to decompress 8/19 continued high output from NGT. Still on Cleviprex  and esmolol . Seen By GI in consult. Stopped CCB due to risk of constipation. Started TPN, PICC placed. 8/20 new tremor stopped the reglan . Still trying to get control of BP and HR. Started scheduled IV hydral + esmolol . 8/21 Patient became hypotensive, clonidine  patch was removed, hydralazine , esmolol  were stopped 8/22 on low-dose labetalol  infusion  Interim History /  Subjective:  Patient had very small 7 bowel movements in last 24 hours Remain on labetalol  infusion at 1.5 mg/h NG tube output was 550 cc as well as urine output was 550 cc Denies abdominal pain, nausea and vomiting  Objective    Blood pressure 119/74, pulse 83, temperature 99.2 F (37.3 C), temperature source Oral, resp. rate (!) 22, height 5' 10 (1.778 m), weight 88.9 kg, SpO2 93%.        Intake/Output Summary (Last 24 hours) at 12/18/2023 0906 Last data filed at 12/18/2023 0900 Gross per 24 hour  Intake 5569.04 ml  Output 1200 ml  Net 4369.04 ml   Filed Weights   12/15/23 0452 12/16/23 0500 12/17/23 0500  Weight: 89.8 kg 89.5 kg 88.9 kg    Physical exam General: Elderly male, sitting on recliner HEENT: [place]/AT, eyes anicteric.  moist mucus membranes.  NGT in place, draining bilious fluid Neuro: Alert, awake following commands Chest: Coarse breath sounds, no wheezes or rhonchi Heart: Regular rate and rhythm, no murmurs or gallops Abdomen: Soft, distended, nontender, absent bowel sounds  Labs and images reviewed  Patient Lines/Drains/Airways Status     Active Line/Drains/Airways     Name Placement date Placement time Site Days   PICC Triple Lumen 12/14/23 Left Basilic 48 cm 0 cm 12/14/23  8242  -- 3   NG/OG Vented/Dual Lumen Left nare External length of tube 58 cm 12/11/23  0045  Left nare  6         Resolved    Lactic acidosis on 8/17 Hypophosphatemia/hypokalemia Assessment and Plan  Type B aortic dissection with intramural hematoma Hypertensive emergency Appreciate vascular surgery follow-up, recommend continue to manage conservatively by keeping systolic blood pressure less than 120 and heart  rate less than 60 Patient continued to require labetalol  infusion, currently on 1.5 mg/h Cannot start oral medications, until bowel functions returns Continue as needed hydralazine    Severe ileus Patient has been on neostigmine  for almost 1 week without much  improvement Lactulose  enema and neostigmine  were stopped yesterday Continue Dulcolax suppository, had a 7 small bowel movements yesterday Continue erythromycin  for 48 hours Continue NGT on low intermittent wall suction, output was 550 cc in last 24 hours Okay for ice chips and chewing gum for GI stimulation Continue aggressive IV fluid resuscitation to match GI losses Still serum potassium remains 3.8 We will try to keep serum potassium 4-4.5 with aggressive supplementation  Refeeding syndrome, moderate protein energy malnutrition Continue TPN   AKI, likely prerenal due to dehydration from GI losses Patient serum creatinine started improving, will give 2 more liters IV fluid Monitor intake and output Avoid nephrotoxic agent Serum creatinine trended down from 1.9 down to 1.5   Acute respiratory failure with hypoxia-- suspect he had aspiration when NGT was not working that led to aspiration pneumonitis He remained afebrile, hold off on antibiotics Continue to titrate nasal cannula oxygen with O2 sat goal 92%   Hypernatremia, due to dehydration from GI losses Serum sodium has corrected   Diabetes type 2 Patient's hemoglobin A1c 7.1 Continue sliding scale insulin  with CBG goal 140-180 He is also getting insulin  in TPN  Hx prostate cancer; in remission; had local radiation treatment Outpatient follow-up with oncology  The patient is critically ill due to type B aortic dissection/hypertensive emergency/severe ileus.  Critical care was necessary to treat or prevent imminent or life-threatening deterioration.  Critical care was time spent personally by me on the following activities: development of treatment plan with patient and/or surrogate as well as nursing, discussions with consultants, evaluation of patient's response to treatment, examination of patient, obtaining history from patient or surrogate, ordering and performing treatments and interventions, ordering and review of  laboratory studies, ordering and review of radiographic studies, pulse oximetry, re-evaluation of patient's condition and participation in multidisciplinary rounds.   During this encounter critical care time was devoted to patient care services described in this note for 36 minutes.     Valinda Novas, MD [place] [facility] See Amion for pager If no response to pager, please call [phone] until 7pm After 7pm, Please call E-link [phone]

## 2023-12-18 NOTE — Progress Notes (Addendum)
 PHARMACY - TOTAL PARENTERAL NUTRITION CONSULT NOTE   Indication: Prolonged ileus  Patient Measurements: Height: 5' 10 (177.8 cm) Weight: 88.9 kg (195 lb 15.8 oz) IBW/kg (Calculated) : 73 TPN AdjBW (KG): 90.7 Body mass index is 28.12 kg/m. Usual Weight: 91.2 kg   Assessment:  Patient is a 78 yo M admitted with aortic dissection with intramural hematoma. Patient developed severe ileus secondary to dissection. Patient was active and had good nutrition PTA. Without nutrition for 7 days at the start of TPN. Patient at moderate risk of refeeding.   Glucose / Insulin : A1c 7.1, CBGs <180, SSI 19u in 24h  Electrolytes: Na 143, K 3.9 (after 110 mEq outside TPN, goal 4.5 per primary), CoCa 9.3., Phos 2.5, Mag 2.0, Cl 113, Co2 25 Renal: Scr 1.54 trending down, BUN 30 Hepatic: AST/ALT wnl, T bili 1.0, Alb 1.9 Intake / Output; MIVF: UOP 0.3 ml/kg/hr, NG -550 mL, net +5.9L since admission,   GI Imaging:  8/11 CT abd- Extensive intramural hematoma involving the descending thoracic aorta extending throughout the abdominal aorta and into the right inflow vessels terminating in the mid right external iliac artery. 8/14 DG abd- There is increased amount of gas throughout the small bowel and colon, likely manifestation of adynamic ileus 8/17 DG abd- Persistent but improved gaseous gastric distension  8/19 DG abd- unchanged  GI Surgeries / Procedures: N/A  Central access: PICC  TPN start date: 8/19  Nutritional Goals: Goal TPN rate is 80 mL/hr (provides 100 g of protein and 1994 kcals per day)  RD Assessment: Estimated Needs Total Energy Estimated Needs: 1900-2100 kcals Total Protein Estimated Needs: 95-105 g Total Fluid Estimated Needs: >/= 2L (not taking into account high NG output)  Current Nutrition:  NPO   Plan:  Continue TPN at 80 mL/hr which provides 100% of estimated daily caloric needs  Electrolytes in TPN (slowly adding back due to AKI),: increase Na to 10 mEq/L, increase K to 25  mEq/L, Ca 28mEq/L, increase to Mg 5 mEq/L, and increase to Phos 5 mmol/L. Cl:Ac 1:2 Add standard MVI and trace elements to TPN Adjust to Moderate q4h SSI and adjust as needed  Add insulin  regular 10 units to TPN Thiamine  x 5 days IV per discussion with RD  Monitor TPN labs on Mon/Thurs  Thank you for allowing pharmacy to be a part of this patient's care.  Shelba Collier, PharmD, BCPS Clinical Pharmacist

## 2023-12-18 NOTE — Progress Notes (Signed)
 Patient's temperature is 100.5 but he is currently refusing tylenol  and ice packs. Will continue to monitor.

## 2023-12-19 ENCOUNTER — Inpatient Hospital Stay (HOSPITAL_COMMUNITY)

## 2023-12-19 DIAGNOSIS — K567 Ileus, unspecified: Secondary | ICD-10-CM | POA: Diagnosis not present

## 2023-12-19 DIAGNOSIS — I161 Hypertensive emergency: Secondary | ICD-10-CM | POA: Diagnosis not present

## 2023-12-19 DIAGNOSIS — I7103 Dissection of thoracoabdominal aorta: Secondary | ICD-10-CM | POA: Diagnosis not present

## 2023-12-19 DIAGNOSIS — E440 Moderate protein-calorie malnutrition: Secondary | ICD-10-CM | POA: Diagnosis not present

## 2023-12-19 LAB — MAGNESIUM: Magnesium: 1.6 mg/dL — ABNORMAL LOW (ref 1.7–2.4)

## 2023-12-19 LAB — BASIC METABOLIC PANEL WITH GFR
Anion gap: 7 (ref 5–15)
BUN: 21 mg/dL (ref 8–23)
CO2: 20 mmol/L — ABNORMAL LOW (ref 22–32)
Calcium: 7.4 mg/dL — ABNORMAL LOW (ref 8.9–10.3)
Chloride: 114 mmol/L — ABNORMAL HIGH (ref 98–111)
Creatinine, Ser: 0.97 mg/dL (ref 0.61–1.24)
GFR, Estimated: >60 mL/min (ref >60)
Glucose, Bld: 97 mg/dL (ref 70–99)
Potassium: 4.3 mmol/L (ref 3.5–5.1)
Sodium: 141 mmol/L (ref 135–145)

## 2023-12-19 LAB — GLUCOSE, CAPILLARY
Glucose-Capillary: 125 mg/dL — ABNORMAL HIGH (ref 70–99)
Glucose-Capillary: 142 mg/dL — ABNORMAL HIGH (ref 70–99)
Glucose-Capillary: 143 mg/dL — ABNORMAL HIGH (ref 70–99)
Glucose-Capillary: 151 mg/dL — ABNORMAL HIGH (ref 70–99)
Glucose-Capillary: 152 mg/dL — ABNORMAL HIGH (ref 70–99)

## 2023-12-19 LAB — PHOSPHORUS: Phosphorus: 1.9 mg/dL — ABNORMAL LOW (ref 2.5–4.6)

## 2023-12-19 MED ORDER — INSULIN ASPART 100 UNIT/ML IJ SOLN
0.0000 [IU] | Freq: Four times a day (QID) | INTRAMUSCULAR | Status: DC
  Administered 2023-12-19: 2 [IU] via SUBCUTANEOUS
  Administered 2023-12-19: 3 [IU] via SUBCUTANEOUS
  Administered 2023-12-19: 2 [IU] via SUBCUTANEOUS
  Administered 2023-12-20: 3 [IU] via SUBCUTANEOUS

## 2023-12-19 MED ORDER — POTASSIUM PHOSPHATES 15 MMOLE/5ML IV SOLN
30.0000 mmol | Freq: Once | INTRAVENOUS | Status: AC
  Administered 2023-12-19: 30 mmol via INTRAVENOUS
  Filled 2023-12-19: qty 10

## 2023-12-19 MED ORDER — LACTATED RINGERS IV BOLUS
1000.0000 mL | Freq: Once | INTRAVENOUS | Status: AC
  Administered 2023-12-19: 1000 mL via INTRAVENOUS

## 2023-12-19 MED ORDER — MAGNESIUM SULFATE 4 GM/100ML IV SOLN
4.0000 g | Freq: Once | INTRAVENOUS | Status: AC
  Administered 2023-12-19: 4 g via INTRAVENOUS
  Filled 2023-12-19: qty 100

## 2023-12-19 MED ORDER — TRAVASOL 10 % IV SOLN
INTRAVENOUS | Status: AC
  Filled 2023-12-19: qty 998.4

## 2023-12-19 MED ORDER — SODIUM CHLORIDE 0.9 % IV SOLN
250.0000 mg | Freq: Four times a day (QID) | INTRAVENOUS | Status: AC
  Administered 2023-12-19 – 2023-12-21 (×8): 250 mg via INTRAVENOUS
  Filled 2023-12-19 (×8): qty 5

## 2023-12-19 NOTE — Progress Notes (Signed)
 NAME:  Nicholas Burke, MRN:  [id number], DOB:  18-Feb-1946, LOS: 13 ADMISSION DATE:  12/06/2023, CONSULTATION DATE:  12/06/2023 REFERRING MD: Dr. Celinda - [facility], CHIEF COMPLAINT: Intramural hematoma  History of Present Illness:  Nicholas Burke is a 78 year old male with a past medical history significant for prediabetes, hypertension, and prostate cancer who presented to the ED at [facility] 8/11 for complaints of epigastric pain.  On ED arrival patient was seen hypertensive with all other vital signs within normal limit.  CT abdomen pelvis obtained and revealed extensive intramural hematoma involving the descending thoracic aorta extending throughout the abdominal aorta and into the right external iliac artery.  CTA chest again confirms intramural hematoma beginning at the transverse aorta origin of the left subclavian artery and extending throughout the thoracic aorta and into the abdominal aorta.  Lab work significant for potassium 2.9.  Vascular surgery evaluated patient in ED and recommended tight blood pressure and heart rate control, PCCM consulted in this setting.   Pertinent  Medical History  prediabetes, hypertension, and prostate cancer  Significant Hospital Events: Including procedures, antibiotic start and stop dates in addition to other pertinent events   8/11 presented with epigastric abdominal pain found to have large intramural hematoma 8/14 remains on esmolol , no indication for urgent surgical intervention 8/15 ongoing issues with ileus 8/16 NGT placed to decompress 8/19 continued high output from NGT. Still on Cleviprex  and esmolol . Seen By GI in consult. Stopped CCB due to risk of constipation. Started TPN, PICC placed. 8/20 new tremor stopped the reglan . Still trying to get control of BP and HR. Started scheduled IV hydral + esmolol . 8/21 Patient became hypotensive, clonidine  patch was removed, hydralazine , esmolol  were stopped 8/22 on low-dose labetalol  infusion 8/23 had multiple small  bowel movements, denies abdominal pain, NG output was 550 cc  Interim History / Subjective:  Patient continued to have multiple bowel movements Remain on labetalol  infusion, currently at 1 mg/h NG output went up to 1200 cc in last 24 hours No abdominal pain, nausea and vomiting Complaining of cough Had low-grade fever with Tmax 100.5 only once yesterday  Objective    Blood pressure 118/75, pulse 80, temperature 98.6 F (37 C), temperature source Oral, resp. rate (!) 23, height 5' 10 (1.778 m), weight 90.1 kg, SpO2 94%. CVP:  [8 mmHg] 8 mmHg      Intake/Output Summary (Last 24 hours) at 12/19/2023 0923 Last data filed at 12/19/2023 0800 Gross per 24 hour  Intake 7947.16 ml  Output 2150 ml  Net 5797.16 ml   Filed Weights   12/16/23 0500 12/17/23 0500 12/19/23 0426  Weight: 89.5 kg 88.9 kg 90.1 kg    Physical exam General: Chronically ill-appearing male, sitting on recliner HEENT: [place]/AT, eyes anicteric.  moist mucus membranes.  NGT in place with brown fluid Neuro: Alert, awake following commands Chest: Coarse breath sounds, no wheezes or rhonchi Heart: Regular rate and rhythm, no murmurs or gallops Abdomen: Soft, nontender, distended, sluggish bowel sounds present  Labs and images reviewed  Patient Lines/Drains/Airways Status     Active Line/Drains/Airways     Name Placement date Placement time Site Days   PICC Triple Lumen 12/14/23 Left Basilic 48 cm 0 cm 12/14/23  8242  -- 3   NG/OG Vented/Dual Lumen Left nare External length of tube 58 cm 12/11/23  0045  Left nare  6         Resolved    Lactic acidosis on 8/17 Hypernatremia Hypokalemia Assessment and Plan  Type  B aortic dissection with intramural hematoma Hypertensive emergency Continue conservative management by controlling blood pressure and heart rate with goal SBP less than 120 and heart rate less than 60 Continue labetalol  infusion, currently on 1 mg/h Cannot restart oral meds yet due to severe ileus  though started improving  Continue as needed hydralazine    Severe ileus Patient has been on neostigmine  for almost 1 week without much improvement Lactulose  enema and neostigmine  were stopped  He is having multiple bowel movements now and sluggish bowel movements noted on abdominal exam Will repeat KUB Continue Dulcolax suppository and erythromycin  for 48 hours more Continue NGT on low intermittent wall suction, output was 1200 cc in last 24 hours Continue ice chips and chewing gum for GI stimulation Continue aggressive IV fluid resuscitation to match GI losses Serum potassium finally improved to 4.3, goal to keep between 4-4.5  Refeeding syndrome, moderate protein energy malnutrition Hypophosphatemia/hypomagnesemia Continue TPN Continue aggressive electrolyte replacement   AKI, likely prerenal due to dehydration from GI losses Serum creatinine continued to improve, down to 0.97 Monitor intake and output Give 1 more liter of IV fluid to match GI losses Avoid nephrotoxic agent   Acute respiratory failure with hypoxia-- suspect he had aspiration when NGT was not working that led to aspiration pneumonitis Will get x-ray chest today as he spiked 100.5 only once overnight Now he is afebrile, will hold off on antibiotics for now Continue to titrate nasal cannula oxygen with O2 sat goal 92%   Diabetes type 2 Patient's hemoglobin A1c 7.1 Continue sliding scale insulin  with CBG goal 140-180 He is also getting insulin  in TPN  Hx prostate cancer; in remission; had local radiation treatment Outpatient follow-up with oncology     The patient is critically ill due to type B aortic dissection/hypertensive emergency/severe ileus.  Critical care was necessary to treat or prevent imminent or life-threatening deterioration.  Critical care was time spent personally by me on the following activities: development of treatment plan with patient and/or surrogate as well as nursing, discussions with  consultants, evaluation of patient's response to treatment, examination of patient, obtaining history from patient or surrogate, ordering and performing treatments and interventions, ordering and review of laboratory studies, ordering and review of radiographic studies, pulse oximetry, re-evaluation of patient's condition and participation in multidisciplinary rounds.   During this encounter critical care time was devoted to patient care services described in this note for 35 minutes.     Valinda Novas, MD [facility] See Amion for pager If no response to pager, please call [phone] until 7pm After 7pm, Please call E-link [phone]

## 2023-12-19 NOTE — Progress Notes (Signed)
 PHARMACY - TOTAL PARENTERAL NUTRITION CONSULT NOTE   Indication: Prolonged ileus  Patient Measurements: Height: 5' 10 (177.8 cm) Weight: 90.1 kg (198 lb 10.2 oz) IBW/kg (Calculated) : 73 TPN AdjBW (KG): 90.7 Body mass index is 28.5 kg/m. Usual Weight: 91.2 kg   Assessment:  Patient is a 78 yo M admitted with aortic dissection with intramural hematoma. Patient developed severe ileus secondary to dissection. Patient was active and had good nutrition PTA. Without nutrition for 7 days at the start of TPN. Patient at moderate risk of refeeding.   Glucose / Insulin : A1c 7.1, CBGs <180, SSI 19u in 24h  Electrolytes: Na 141 K 4.3 (110 mEq outside TPN, goal 4-4.5 per primary), CoCa 9.1., Phos 1.9, Mag 1.6, Cl 114, CO2 20 Renal: Scr 0.97 trending down, BUN 21 Hepatic: AST/ALT wnl, T bili 1.0, Alb 1.9 Intake / Output; MIVF: UOP 0.5 ml/kg/hr, NG -1200 mL, net +11.6L since admission  GI Imaging:  8/11 CT abd- Extensive intramural hematoma involving the descending thoracic aorta extending throughout the abdominal aorta and into the right inflow vessels terminating in the mid right external iliac artery. 8/14 DG abd- There is increased amount of gas throughout the small bowel and colon, likely manifestation of adynamic ileus 8/17 DG abd- Persistent but improved gaseous gastric distension  8/19 DG abd- unchanged  GI Surgeries / Procedures: N/A  Central access: PICC  TPN start date: 8/19  Nutritional Goals: Goal TPN rate is 80 mL/hr (provides 100 g of protein and 1994 kcals per day)  RD Assessment: Estimated Needs Total Energy Estimated Needs: 1900-2100 kcals Total Protein Estimated Needs: 95-105 g Total Fluid Estimated Needs: >/= 2L (not taking into account high NG output)  Current Nutrition:  NPO   Plan: Continue TPN at 80 mL/hr which provides 100% of estimated daily caloric needs  Electrolytes in TPN (slowly adding back due to resolving AKI),: increase Na to 20 mEq/L, increase K to  40 mEq/L, Ca 2 mEq/L, increase to Mg 10 mEq/L, and increase to Phos 10 mmol/L. Cl:Ac max acetate Repleting with additional IV Kcl, Kphos, and Mg IV outside of TPN Add standard MVI and trace elements to TPN Adjust to Moderate q6h SSI and adjust as needed  Add insulin  regular 10 units to TPN Thiamine  x 5 days IV per discussion with RD  Monitor TPN labs on Mon/Thurs  Thank you for allowing pharmacy to be a part of this patient's care.  Shelba Collier, PharmD, BCPS Clinical Pharmacist

## 2023-12-20 ENCOUNTER — Encounter (HOSPITAL_COMMUNITY)
Admission: EM | Disposition: A | Discharge status: Home / Self Care | Payer: Self-pay | Source: Home / Self Care | Attending: Internal Medicine

## 2023-12-20 ENCOUNTER — Encounter (HOSPITAL_COMMUNITY): Payer: Self-pay | Admitting: Pulmonary Disease

## 2023-12-20 ENCOUNTER — Inpatient Hospital Stay (HOSPITAL_COMMUNITY)

## 2023-12-20 DIAGNOSIS — G9341 Metabolic encephalopathy: Secondary | ICD-10-CM

## 2023-12-20 DIAGNOSIS — D62 Acute posthemorrhagic anemia: Secondary | ICD-10-CM

## 2023-12-20 DIAGNOSIS — K567 Ileus, unspecified: Secondary | ICD-10-CM | POA: Diagnosis not present

## 2023-12-20 DIAGNOSIS — D649 Anemia, unspecified: Secondary | ICD-10-CM | POA: Diagnosis not present

## 2023-12-20 DIAGNOSIS — I7100 Dissection of unspecified site of aorta: Secondary | ICD-10-CM | POA: Diagnosis not present

## 2023-12-20 DIAGNOSIS — K922 Gastrointestinal hemorrhage, unspecified: Secondary | ICD-10-CM

## 2023-12-20 HISTORY — PX: ESOPHAGOGASTRODUODENOSCOPY: SHX5428

## 2023-12-20 LAB — COMPREHENSIVE METABOLIC PANEL WITH GFR
ALT: 28 U/L (ref 0–44)
AST: 44 U/L — ABNORMAL HIGH (ref 15–41)
Albumin: <1.5 g/dL — ABNORMAL LOW (ref 3.5–5.0)
Alkaline Phosphatase: 44 U/L (ref 38–126)
Anion gap: 5 (ref 5–15)
BUN: 34 mg/dL — ABNORMAL HIGH (ref 8–23)
CO2: 21 mmol/L — ABNORMAL LOW (ref 22–32)
Calcium: 7 mg/dL — ABNORMAL LOW (ref 8.9–10.3)
Chloride: 113 mmol/L — ABNORMAL HIGH (ref 98–111)
Creatinine, Ser: 1.35 mg/dL — ABNORMAL HIGH (ref 0.61–1.24)
GFR, Estimated: 54 mL/min — ABNORMAL LOW (ref >60)
Glucose, Bld: 184 mg/dL — ABNORMAL HIGH (ref 70–99)
Potassium: 5 mmol/L (ref 3.5–5.1)
Sodium: 139 mmol/L (ref 135–145)
Total Bilirubin: 0.9 mg/dL (ref 0.0–1.2)
Total Protein: 4.1 g/dL — ABNORMAL LOW (ref 6.5–8.1)

## 2023-12-20 LAB — DIC (DISSEMINATED INTRAVASCULAR COAGULATION)PANEL
D-Dimer, Quant: 3.71 ug{FEU}/mL — ABNORMAL HIGH (ref 0.00–0.50)
Fibrinogen: 330 mg/dL (ref 210–475)
INR: 1.4 — ABNORMAL HIGH (ref 0.8–1.2)
Platelets: 221 K/uL (ref 150–400)
Prothrombin Time: 17.9 s — ABNORMAL HIGH (ref 11.4–15.2)
Smear Review: NONE SEEN
aPTT: 42 s — ABNORMAL HIGH (ref 24–36)

## 2023-12-20 LAB — CBC WITH DIFFERENTIAL/PLATELET
Abs Immature Granulocytes: 0.35 K/uL — ABNORMAL HIGH (ref 0.00–0.07)
Basophils Absolute: 0 K/uL (ref 0.0–0.1)
Basophils Relative: 0 %
Eosinophils Absolute: 0.1 K/uL (ref 0.0–0.5)
Eosinophils Relative: 1 %
HCT: 16.7 % — ABNORMAL LOW (ref 39.0–52.0)
Hemoglobin: 5.3 g/dL — CL (ref 13.0–17.0)
Immature Granulocytes: 2 %
Lymphocytes Relative: 7 %
Lymphs Abs: 1.2 K/uL (ref 0.7–4.0)
MCH: 29.1 pg (ref 26.0–34.0)
MCHC: 31.7 g/dL (ref 30.0–36.0)
MCV: 91.8 fL (ref 80.0–100.0)
Monocytes Absolute: 0.7 K/uL (ref 0.1–1.0)
Monocytes Relative: 4 %
Neutro Abs: 13.9 K/uL — ABNORMAL HIGH (ref 1.7–7.7)
Neutrophils Relative %: 86 %
Platelets: 234 K/uL (ref 150–400)
RBC: 1.82 MIL/uL — ABNORMAL LOW (ref 4.22–5.81)
RDW: 16.4 % — ABNORMAL HIGH (ref 11.5–15.5)
WBC: 16.3 K/uL — ABNORMAL HIGH (ref 4.0–10.5)
nRBC: 0 % (ref 0.0–0.2)

## 2023-12-20 LAB — BASIC METABOLIC PANEL WITH GFR
Anion gap: 3 — ABNORMAL LOW (ref 5–15)
BUN: 37 mg/dL — ABNORMAL HIGH (ref 8–23)
CO2: 22 mmol/L (ref 22–32)
Calcium: 6.9 mg/dL — ABNORMAL LOW (ref 8.9–10.3)
Chloride: 116 mmol/L — ABNORMAL HIGH (ref 98–111)
Creatinine, Ser: 1.18 mg/dL (ref 0.61–1.24)
GFR, Estimated: >60 mL/min (ref >60)
Glucose, Bld: 106 mg/dL — ABNORMAL HIGH (ref 70–99)
Potassium: 5 mmol/L (ref 3.5–5.1)
Sodium: 141 mmol/L (ref 135–145)

## 2023-12-20 LAB — GLUCOSE, CAPILLARY
Glucose-Capillary: 173 mg/dL — ABNORMAL HIGH (ref 70–99)
Glucose-Capillary: 120 mg/dL — ABNORMAL HIGH (ref 70–99)
Glucose-Capillary: 153 mg/dL — ABNORMAL HIGH (ref 70–99)
Glucose-Capillary: 110 mg/dL — ABNORMAL HIGH (ref 70–99)

## 2023-12-20 LAB — PREPARE RBC (CROSSMATCH)

## 2023-12-20 LAB — HEMOGLOBIN AND HEMATOCRIT, BLOOD
HCT: 16.3 % — ABNORMAL LOW (ref 39.0–52.0)
HCT: 23.9 % — ABNORMAL LOW (ref 39.0–52.0)
HCT: 21.6 % — ABNORMAL LOW (ref 39.0–52.0)
Hemoglobin: 5.2 g/dL — CL (ref 13.0–17.0)
Hemoglobin: 7.8 g/dL — ABNORMAL LOW (ref 13.0–17.0)
Hemoglobin: 7.1 g/dL — ABNORMAL LOW (ref 13.0–17.0)

## 2023-12-20 LAB — ABO/RH: ABO/RH(D): A POS

## 2023-12-20 LAB — PROCALCITONIN: Procalcitonin: 0.15 ng/mL

## 2023-12-20 LAB — TRIGLYCERIDES: Triglycerides: 82 mg/dL (ref <150)

## 2023-12-20 LAB — PHOSPHORUS: Phosphorus: 3 mg/dL (ref 2.5–4.6)

## 2023-12-20 LAB — MAGNESIUM: Magnesium: 2.5 mg/dL — ABNORMAL HIGH (ref 1.7–2.4)

## 2023-12-20 SURGERY — EGD (ESOPHAGOGASTRODUODENOSCOPY)
Anesthesia: Moderate Sedation

## 2023-12-20 MED ORDER — LEVALBUTEROL HCL 0.63 MG/3ML IN NEBU
INHALATION_SOLUTION | RESPIRATORY_TRACT | Status: AC
  Administered 2023-12-20: 0.63 mg via RESPIRATORY_TRACT
  Filled 2023-12-20: qty 3

## 2023-12-20 MED ORDER — INSULIN ASPART 100 UNIT/ML IJ SOLN
0.0000 [IU] | INTRAMUSCULAR | Status: DC
  Administered 2023-12-20: 3 [IU] via SUBCUTANEOUS
  Administered 2023-12-21 – 2023-12-22 (×9): 2 [IU] via SUBCUTANEOUS

## 2023-12-20 MED ORDER — MIDAZOLAM HCL (PF) 10 MG/2ML IJ SOLN
INTRAMUSCULAR | Status: DC | PRN
  Administered 2023-12-20: 1 mg via INTRAVENOUS
  Administered 2023-12-20: 2 mg via INTRAVENOUS

## 2023-12-20 MED ORDER — IOHEXOL 350 MG/ML SOLN
75.0000 mL | Freq: Once | INTRAVENOUS | Status: AC | PRN
  Administered 2023-12-20: 75 mL via INTRAVENOUS

## 2023-12-20 MED ORDER — BUTAMBEN-TETRACAINE-BENZOCAINE 2-2-14 % EX AERO
INHALATION_SPRAY | CUTANEOUS | Status: DC | PRN
  Administered 2023-12-20: 2 via TOPICAL

## 2023-12-20 MED ORDER — SODIUM CHLORIDE 0.9% IV SOLUTION
Freq: Once | INTRAVENOUS | Status: AC
  Administered 2023-12-20: 10 mL via INTRAVENOUS

## 2023-12-20 MED ORDER — PANTOPRAZOLE SODIUM 40 MG IV SOLR
40.0000 mg | Freq: Two times a day (BID) | INTRAVENOUS | Status: DC
  Administered 2023-12-20 – 2023-12-26 (×14): 40 mg via INTRAVENOUS
  Filled 2023-12-20 (×14): qty 10

## 2023-12-20 MED ORDER — LACTATED RINGERS IV SOLN: INTRAVENOUS | Status: AC

## 2023-12-20 MED ORDER — BUTAMBEN-TETRACAINE-BENZOCAINE 2-2-14 % EX AERO
INHALATION_SPRAY | CUTANEOUS | Status: AC
  Filled 2023-12-20: qty 20

## 2023-12-20 MED ORDER — SODIUM CHLORIDE 0.9 % IV SOLN: INTRAVENOUS | Status: DC

## 2023-12-20 MED ORDER — FENTANYL CITRATE (PF) 100 MCG/2ML IJ SOLN
INTRAMUSCULAR | Status: AC
  Filled 2023-12-20: qty 4

## 2023-12-20 MED ORDER — SODIUM CHLORIDE 0.9 % IV SOLN
3.0000 g | Freq: Four times a day (QID) | INTRAVENOUS | Status: AC
  Administered 2023-12-20 – 2023-12-25 (×20): 3 g via INTRAVENOUS
  Filled 2023-12-20 (×20): qty 8

## 2023-12-20 MED ORDER — FENTANYL CITRATE (PF) 100 MCG/2ML IJ SOLN
INTRAMUSCULAR | Status: DC | PRN
  Administered 2023-12-20: 25 ug via INTRAVENOUS
  Administered 2023-12-20: 12.5 ug via INTRAVENOUS

## 2023-12-20 MED ORDER — EPINEPHRINE 1 MG/10ML IJ SOSY
PREFILLED_SYRINGE | INTRAMUSCULAR | Status: AC
  Filled 2023-12-20: qty 10

## 2023-12-20 MED ORDER — TRAVASOL 10 % IV SOLN
INTRAVENOUS | Status: AC
  Filled 2023-12-20: qty 998.4

## 2023-12-20 MED ORDER — SODIUM CHLORIDE 0.9% IV SOLUTION: Freq: Once | INTRAVENOUS | Status: AC

## 2023-12-20 MED ORDER — LEVALBUTEROL HCL 0.63 MG/3ML IN NEBU
0.6300 mg | INHALATION_SOLUTION | RESPIRATORY_TRACT | Status: DC | PRN
  Administered 2023-12-27 – 2024-01-01 (×8): 0.63 mg via RESPIRATORY_TRACT
  Filled 2023-12-20 (×8): qty 3

## 2023-12-20 MED ORDER — MIDAZOLAM HCL 2 MG/2ML IJ SOLN
INTRAMUSCULAR | Status: AC
  Filled 2023-12-20: qty 4

## 2023-12-20 NOTE — Progress Notes (Addendum)
 NAME:  [name], MRN:  [id number], DOB:  May 05, 1945, LOS: 14 ADMISSION DATE:  12/06/2023, CONSULTATION DATE:  12/06/2023 REFERRING MD: Dr. Celinda - [facility], CHIEF COMPLAINT: Intramural hematoma  History of Present Illness:  Nicholas Burke is a 78 year old male with a past medical history significant for prediabetes, hypertension, and prostate cancer who presented to the ED at [facility] 8/11 for complaints of epigastric pain.  On ED arrival patient was seen hypertensive with all other vital signs within normal limit.  CT abdomen pelvis obtained and revealed extensive intramural hematoma involving the descending thoracic aorta extending throughout the abdominal aorta and into the right external iliac artery.  CTA chest again confirms intramural hematoma beginning at the transverse aorta origin of the left subclavian artery and extending throughout the thoracic aorta and into the abdominal aorta.  Lab work significant for potassium 2.9.  Vascular surgery evaluated patient in ED and recommended tight blood pressure and heart rate control, PCCM consulted in this setting.   Pertinent  Medical History  prediabetes, hypertension, and prostate cancer  Significant Hospital Events: Including procedures, antibiotic start and stop dates in addition to other pertinent events   8/11 presented with epigastric abdominal pain found to have large intramural hematoma 8/14 remains on esmolol , no indication for urgent surgical intervention 8/15 ongoing issues with ileus 8/16 NGT placed to decompress 8/19 continued high output from NGT. Still on Cleviprex  and esmolol . Seen By GI in consult. Stopped CCB due to risk of constipation. Started TPN, PICC placed. 8/20 new tremor stopped the reglan . Still trying to get control of BP and HR. Started scheduled IV hydral + esmolol . 8/21 Patient became hypotensive, clonidine  patch was removed, hydralazine , esmolol  were stopped 8/22 on low-dose labetalol  infusion 8/23 had multiple small  bowel movements, denies abdominal pain, NG output was 550 cc 8/24 BMs  8/25 hgb drop 5.3, 2 PRBC. AKI   Interim History / Subjective:  Hgb 5.3 2 PRBC ordered this morning  Cr bump to 1.35  Off labetalol  gtt   Objective    Blood pressure 136/79, pulse (!) 102, temperature 98 F (36.7 C), temperature source Oral, resp. rate (!) 30, height 5' 10 (1.778 m), weight 90.2 kg, SpO2 99%.        Intake/Output Summary (Last 24 hours) at 12/20/2023 1015 Last data filed at 12/20/2023 0944 Gross per 24 hour  Intake 6299.92 ml  Output 1360 ml  Net 4939.92 ml   Filed Weights   12/17/23 0500 12/19/23 0426 12/20/23 0327  Weight: 88.9 kg 90.1 kg 90.2 kg    Physical exam General: Ill appearing M NAD  HEENT: NCAT. NGT with thin bloodied maroon output  Neuro: AAOx4  Chest: even unlabored, clear  Heart: rrr Abdomen: distended soft.  MSK: no obvious acute deformity    Resolved    Lactic acidosis on 8/17 Hypernatremia Hypokalemia HTN emergency  Assessment and Plan    Type B aortic dissection w intramural hematoma  -incr tachycardia 8/25 associated hgb drop to 5.3   P -SBP < 120 HR 60 ideally but hasn't been achieved in days  -if need to give antihypertensives before blood would favor labetalol  w incr tachycardia. Hoping blood will help tachy  -with anemia below, also get dissection study   Anemia  -hgb 5.3 on 8/25. Last checked 8/22 was 11. Bloody OGT output and wife said stool last night smelled bloody  P -agree w 2 PRBC, post transfusion and serial h/h -sending for CTA GI protocol 8/25 with bloody OGT and  wife c/f metallic/bloody smelling stools  + CTA dissection study    Ileus GIB (8/25)  Refeeding syndrome P -cont bowel reg - on erythromycin  dulcolax -NGT LIWS  -on TPN  - NGT output is slowing but it is bloody 8/25 -- not sure when that started.. Cont PPI. Imaging as above  -optimize lytes   Acute hypoxic resp failure LLL ASD -- PNA v atelectasis C/f aspiration PNA  v HCAP  -spiked temp x 1 on 8/24, abx deferred P -sending a PCT. If + or if +add'l temps will start unasyn   -pulm hygiene -O2 as needed goal > 92    AKI  -up to 1.35 from 0.97. Associated hgb drop  P -repeat BMP this afternoon after contrast    DM2 -SSI   Hx prostate cancer; in remission; had local radiation treatment Outpatient follow-up with oncology     CRITICAL CARE Performed by: Ronnald FORBES Gave   Total critical care time: 49 minutes  Critical care time was exclusive of separately billable procedures and treating other patients. Critical care was necessary to treat or prevent imminent or life-threatening deterioration.  Critical care was time spent personally by me on the following activities: development of treatment plan with patient and/or surrogate as well as nursing, discussions with consultants, evaluation of patient's response to treatment, examination of patient, obtaining history from patient or surrogate, ordering and performing treatments and interventions, ordering and review of laboratory studies, ordering and review of radiographic studies, pulse oximetry and re-evaluation of patient's condition.  Ronnald Gave MSN, AGACNP-BC [facility] Amion for pager 12/20/2023, 10:15 AM

## 2023-12-20 NOTE — Consult Note (Signed)
 Reason for Consult: Upper GI bleed Referring Physician: ICU team  Nicholas Masden is an 78 y.o. male.  HPI: Patient seen and examined and discussed with his wife as well as the ICU team and he has had blood in his NG since yesterday as well as black stools since yesterday and is not on any aspirin or blood thinners and his hospital computer chart reviewed and his office computer chart reviewed and his last colonoscopy was in 21 with only some mild radiation proctitis and he has not had any stomach problems and no history of ulcers and has no other complaints and we answered all of the patient and the family's questions  Past Medical History:  Diagnosis Date   Hx of radiation therapy 01/02/13- 02/24/13   prostate 7800 cGy/40 sessions, seminal vesicles 5600 cGy/40 sessions   Hypertension    Pre-diabetes    Prostate cancer (HCC) 08/30/2012   gleason 6, vol 65.7 cc    Past Surgical History:  Procedure Laterality Date   CATARACT EXTRACTION, BILATERAL Bilateral    COLONOSCOPY WITH PROPOFOL  N/A 10/18/2014   Procedure: COLONOSCOPY WITH PROPOFOL ;  Surgeon: [name], MD;  Location: [facility];  Service: Endoscopy;  Laterality: N/A;   CRYOABLATION N/A 12/24/2020   Procedure: CRYO ABLATION PROSTATE;  Surgeon: [name], MD;  Location: [facility];  Service: Urology;  Laterality: N/A;   EYE SURGERY Bilateral    cataract surgery   HOT HEMOSTASIS N/A 10/18/2014   Procedure: HOT HEMOSTASIS (ARGON PLASMA COAGULATION/BICAP);  Surgeon: [name], MD;  Location: [facility];  Service: Endoscopy;  Laterality: N/A;   PROSTATE BIOPSY  08/2012    Family History  Problem Relation Age of Onset   Prostate cancer Father    Prostate cancer Brother     Social History:  reports that he has never smoked. He has never used smokeless tobacco. He reports that he does not drink alcohol and does not use drugs.  Allergies: No Known Allergies  Medications: I have reviewed the patient's current  medications.  Results for orders placed or performed during the hospital encounter of 12/06/23 (from the past 48 hours)  Glucose, capillary     Status: Abnormal   Collection Time: 12/18/23  7:54 PM  Result Value Ref Range   Glucose-Capillary 131 (H) 70 - 99 mg/dL    Comment: Glucose reference range applies only to samples taken after fasting for at least 8 hours.  Basic metabolic panel with GFR     Status: Abnormal   Collection Time: 12/18/23  9:25 PM  Result Value Ref Range   Sodium 142 135 - 145 mmol/L   Potassium 3.9 3.5 - 5.1 mmol/L   Chloride 112 (H) 98 - 111 mmol/L   CO2 24 22 - 32 mmol/L   Glucose, Bld 132 (H) 70 - 99 mg/dL    Comment: Glucose reference range applies only to samples taken after fasting for at least 8 hours.   BUN 26 (H) 8 - 23 mg/dL   Creatinine, Ser 8.67 (H) 0.61 - 1.24 mg/dL   Calcium  7.8 (L) 8.9 - 10.3 mg/dL   GFR, Estimated 56 (L) >60 mL/min    Comment: (NOTE) Calculated using the CKD-EPI Creatinine Equation (2021)    Anion gap 6 5 - 15    Comment: Performed at [facility], [address], [place], [place] [address]  Glucose, capillary     Status: Abnormal   Collection Time: 12/18/23 11:31 PM  Result Value Ref Range   Glucose-Capillary  118 (H) 70 - 99 mg/dL    Comment: Glucose reference range applies only to samples taken after fasting for at least 8 hours.  Glucose, capillary     Status: Abnormal   Collection Time: 12/19/23  3:44 AM  Result Value Ref Range   Glucose-Capillary 143 (H) 70 - 99 mg/dL    Comment: Glucose reference range applies only to samples taken after fasting for at least 8 hours.  Basic metabolic panel with GFR     Status: Abnormal   Collection Time: 12/19/23  5:27 AM  Result Value Ref Range   Sodium 141 135 - 145 mmol/L   Potassium 4.3 3.5 - 5.1 mmol/L   Chloride 114 (H) 98 - 111 mmol/L   CO2 20 (L) 22 - 32 mmol/L   Glucose, Bld 97 70 - 99 mg/dL    Comment: Glucose reference range applies only to samples taken after  fasting for at least 8 hours.   BUN 21 8 - 23 mg/dL   Creatinine, Ser 9.02 0.61 - 1.24 mg/dL   Calcium  7.4 (L) 8.9 - 10.3 mg/dL   GFR, Estimated >39 >39 mL/min    Comment: (NOTE) Calculated using the CKD-EPI Creatinine Equation (2021)    Anion gap 7 5 - 15    Comment: Performed at [facility], [address], [place], [place] [address]  Magnesium      Status: Abnormal   Collection Time: 12/19/23  5:27 AM  Result Value Ref Range   Magnesium  1.6 (L) 1.7 - 2.4 mg/dL    Comment: Performed at [facility], [address], [place], [place] [address]  Phosphorus     Status: Abnormal   Collection Time: 12/19/23  5:27 AM  Result Value Ref Range   Phosphorus 1.9 (L) 2.5 - 4.6 mg/dL    Comment: Performed at [facility], [address], [place], [place] [address]  Glucose, capillary     Status: Abnormal   Collection Time: 12/19/23  8:44 AM  Result Value Ref Range   Glucose-Capillary 152 (H) 70 - 99 mg/dL    Comment: Glucose reference range applies only to samples taken after fasting for at least 8 hours.  Glucose, capillary     Status: Abnormal   Collection Time: 12/19/23 11:38 AM  Result Value Ref Range   Glucose-Capillary 151 (H) 70 - 99 mg/dL    Comment: Glucose reference range applies only to samples taken after fasting for at least 8 hours.  Glucose, capillary     Status: Abnormal   Collection Time: 12/19/23  4:38 PM  Result Value Ref Range   Glucose-Capillary 125 (H) 70 - 99 mg/dL    Comment: Glucose reference range applies only to samples taken after fasting for at least 8 hours.  Glucose, capillary     Status: Abnormal   Collection Time: 12/19/23 11:50 PM  Result Value Ref Range   Glucose-Capillary 142 (H) 70 - 99 mg/dL    Comment: Glucose reference range applies only to samples taken after fasting for at least 8 hours.  Comprehensive metabolic panel     Status: Abnormal   Collection Time: 12/20/23  3:40 AM  Result Value Ref Range   Sodium 139 135 - 145  mmol/L   Potassium 5.0 3.5 - 5.1 mmol/L   Chloride 113 (H) 98 - 111 mmol/L   CO2 21 (L) 22 - 32 mmol/L   Glucose, Bld 184 (H) 70 - 99 mg/dL    Comment: Glucose reference  range applies only to samples taken after fasting for at least 8 hours.   BUN 34 (H) 8 - 23 mg/dL   Creatinine, Ser 8.64 (H) 0.61 - 1.24 mg/dL   Calcium  7.0 (L) 8.9 - 10.3 mg/dL   Total Protein 4.1 (L) 6.5 - 8.1 g/dL   Albumin  <1.5 (L) 3.5 - 5.0 g/dL   AST 44 (H) 15 - 41 U/L   ALT 28 0 - 44 U/L   Alkaline Phosphatase 44 38 - 126 U/L   Total Bilirubin 0.9 0.0 - 1.2 mg/dL   GFR, Estimated 54 (L) >60 mL/min    Comment: (NOTE) Calculated using the CKD-EPI Creatinine Equation (2021)    Anion gap 5 5 - 15    Comment: Performed at [facility], [address], [place], [place] [address]  Phosphorus     Status: None   Collection Time: 12/20/23  3:40 AM  Result Value Ref Range   Phosphorus 3.0 2.5 - 4.6 mg/dL    Comment: Performed at [facility], [address] [place], [place] [address]  Triglycerides     Status: None   Collection Time: 12/20/23  3:40 AM  Result Value Ref Range   Triglycerides 82 <150 mg/dL    Comment: Performed at [facility], [address], [place], [place] [address]  Magnesium      Status: Abnormal   Collection Time: 12/20/23  3:40 AM  Result Value Ref Range   Magnesium  2.5 (H) 1.7 - 2.4 mg/dL    Comment: Performed at [facility], [address] [place], [place] [address]  Procalcitonin     Status: None   Collection Time: 12/20/23  3:40 AM  Result Value Ref Range   Procalcitonin 0.15 ng/mL    Comment:        Interpretation: PCT (Procalcitonin) <= 0.5 ng/mL: Systemic infection (sepsis) is not likely. Local bacterial infection is possible. (NOTE)       Sepsis PCT Algorithm           Lower Respiratory Tract                                      Infection PCT Algorithm    ----------------------------     ----------------------------         PCT < 0.25 ng/mL                 PCT < 0.10 ng/mL          Strongly encourage             Strongly discourage   discontinuation of antibiotics    initiation of antibiotics    ----------------------------     -----------------------------       PCT 0.25 - 0.50 ng/mL            PCT 0.10 - 0.25 ng/mL               OR       >80% decrease in PCT            Discourage initiation of                                            antibiotics      Encourage discontinuation  of antibiotics    ----------------------------     -----------------------------         PCT >= 0.50 ng/mL              PCT 0.26 - 0.50 ng/mL               AND        <80% decrease in PCT             Encourage initiation of                                             antibiotics       Encourage continuation           of antibiotics    ----------------------------     -----------------------------        PCT >= 0.50 ng/mL                  PCT > 0.50 ng/mL               AND         increase in PCT                  Strongly encourage                                      initiation of antibiotics    Strongly encourage escalation           of antibiotics                                     -----------------------------                                           PCT <= 0.25 ng/mL                                                 OR                                        > 80% decrease in PCT                                      Discontinue / Do not initiate                                             antibiotics  Performed at [facility], [address] [place], [place] [address]   CBC with Differential/Platelet     Status: Abnormal   Collection Time: 12/20/23  4:07 AM  Result Value Ref Range   WBC 16.3 (H) 4.0 - 10.5  K/uL   RBC 1.82 (L) 4.22 - 5.81 MIL/uL   Hemoglobin 5.3 (LL) 13.0 - 17.0 g/dL    Comment: REPEATED TO VERIFY New sample This critical result has been called to M. PRICHARD, RN by [facility] on 12/20/2023 04:55:57,  and has been read back.    HCT 16.7 (L) 39.0 - 52.0 %   MCV 91.8 80.0 - 100.0 fL   MCH 29.1 26.0 - 34.0 pg   MCHC 31.7 30.0 - 36.0 g/dL   RDW 83.5 (H) 88.4 - 84.4 %   Platelets 234 150 - 400 K/uL   nRBC 0.0 0.0 - 0.2 %   Neutrophils Relative % 86 %   Neutro Abs 13.9 (H) 1.7 - 7.7 K/uL   Lymphocytes Relative 7 %   Lymphs Abs 1.2 0.7 - 4.0 K/uL   Monocytes Relative 4 %   Monocytes Absolute 0.7 0.1 - 1.0 K/uL   Eosinophils Relative 1 %   Eosinophils Absolute 0.1 0.0 - 0.5 K/uL   Basophils Relative 0 %   Basophils Absolute 0.0 0.0 - 0.1 K/uL   Immature Granulocytes 2 %   Abs Immature Granulocytes 0.35 (H) 0.00 - 0.07 K/uL    Comment: Performed at [facility], [address] [place] [address], [place], [place] [address]  ABO/Rh     Status: None   Collection Time: 12/20/23  5:16 AM  Result Value Ref Range   ABO/RH(D)      A POS Performed at [facility], [address] [place], [place] [address]   Hemoglobin and hematocrit, blood     Status: Abnormal   Collection Time: 12/20/23  5:33 AM  Result Value Ref Range   Hemoglobin 5.2 (LL) 13.0 - 17.0 g/dL    Comment: REPEATED TO VERIFY CRITICAL VALUE NOTED.  VALUE IS CONSISTENT WITH PREVIOUSLY REPORTED AND CALLED VALUE.    HCT 16.3 (L) 39.0 - 52.0 %    Comment: Performed at [facility], [address], [place], [place] [address]  DIC Panel Daily     Status: Abnormal   Collection Time: 12/20/23  5:33 AM  Result Value Ref Range   Prothrombin Time 17.9 (H) 11.4 - 15.2 seconds   INR 1.4 (H) 0.8 - 1.2    Comment: (NOTE) INR goal varies based on device and disease states.    aPTT 42 (H) 24 - 36 seconds    Comment:        IF BASELINE aPTT IS ELEVATED, SUGGEST PATIENT RISK ASSESSMENT BE USED TO DETERMINE APPROPRIATE ANTICOAGULANT THERAPY.    Fibrinogen 330 210 - 475 mg/dL    Comment: (NOTE) Fibrinogen results may be underestimated in patients receiving thrombolytic therapy.    D-Dimer, Quant 3.71 (H) 0.00 - 0.50 ug/mL-FEU     Comment: (NOTE) At the manufacturer cut-off value of 0.5 g/mL FEU, this assay has a negative predictive value of 95-100%.This assay is intended for use in conjunction with a clinical pretest probability (PTP) assessment model to exclude pulmonary embolism (PE) and deep venous thrombosis (DVT) in outpatients suspected of PE or DVT. Results should be correlated with clinical presentation.    Platelets 221 150 - 400 K/uL   Smear Review NO SCHISTOCYTES SEEN     Comment: Performed at [facility], [address] [place], [place] [address]  Type and screen [facility]     Status: None (Preliminary result)   Collection Time: 12/20/23  5:33 AM  Result Value Ref Range  ABO/RH(D) A POS    Antibody Screen NEG    Sample Expiration 12/23/2023,2359    Unit Number [id number]    Blood Component Type RED CELLS,LR    Unit division 00    Status of Unit ISSUED    Transfusion Status OK TO TRANSFUSE    Crossmatch Result Compatible    Unit Number [id number]    Blood Component Type RED CELLS,LR    Unit division 00    Status of Unit ISSUED    Transfusion Status OK TO TRANSFUSE    Crossmatch Result Compatible    Unit Number [id number]    Blood Component Type RED CELLS,LR    Unit division 00    Status of Unit ISSUED    Transfusion Status OK TO TRANSFUSE    Crossmatch Result      Compatible Performed at [facility], [address], [place], [place] [address]    Unit Number [id number]    Blood Component Type RED CELLS,LR    Unit division 00    Status of Unit ALLOCATED    Transfusion Status OK TO TRANSFUSE    Crossmatch Result Compatible   Prepare RBC (crossmatch)     Status: None   Collection Time: 12/20/23  5:33 AM  Result Value Ref Range   Order Confirmation      ORDER PROCESSED BY BLOOD BANK Performed at [facility], [address] [place], [place] [address]   Glucose, capillary     Status: Abnormal   Collection Time: 12/20/23  6:37 AM   Result Value Ref Range   Glucose-Capillary 173 (H) 70 - 99 mg/dL    Comment: Glucose reference range applies only to samples taken after fasting for at least 8 hours.  Glucose, capillary     Status: Abnormal   Collection Time: 12/20/23 11:09 AM  Result Value Ref Range   Glucose-Capillary 120 (H) 70 - 99 mg/dL    Comment: Glucose reference range applies only to samples taken after fasting for at least 8 hours.  CBC     Status: Abnormal   Collection Time: 12/20/23 12:30 PM  Result Value Ref Range   WBC 14.8 (H) 4.0 - 10.5 K/uL   RBC 2.45 (L) 4.22 - 5.81 MIL/uL   Hemoglobin 6.9 (LL) 13.0 - 17.0 g/dL    Comment: REPEATED TO VERIFY CRITICAL VALUE NOTED.  VALUE IS CONSISTENT WITH PREVIOUSLY REPORTED AND CALLED VALUE. This critical result has been called to MARIA TIN by Gabriela Sorto on 12/20/2023 12:59:37, and has been read back.    HCT 21.8 (L) 39.0 - 52.0 %   MCV 89.0 80.0 - 100.0 fL   MCH 28.2 26.0 - 34.0 pg   MCHC 31.7 30.0 - 36.0 g/dL   RDW 84.5 88.4 - 84.4 %   Platelets 204 150 - 400 K/uL   nRBC 0.0 0.0 - 0.2 %    Comment: Performed at [facility], [address] [place], [place] [address]  Prepare RBC (crossmatch)     Status: None   Collection Time: 12/20/23  1:14 PM  Result Value Ref Range   Order Confirmation      ORDER PROCESSED BY BLOOD BANK Performed at [facility], [address] [place], [place] [address]   Glucose, capillary     Status: Abnormal   Collection Time: 12/20/23  3:59 PM  Result Value Ref Range   Glucose-Capillary 153 (H) 70 - 99 mg/dL    Comment: Glucose reference range  applies only to samples taken after fasting for at least 8 hours.    CT Angio Chest/Abd/Pel for Dissection W and/or W/WO Result Date: 12/20/2023 CLINICAL DATA:  Follow-up intramural hematoma beginning at the origin of the left subclavian artery and extending inferiorly into the right common and external iliac arteries with associated aneurysmal dilatation of the  descending thoracic aorta. Gastrointestinal bleeding. EXAM: CT ANGIOGRAPHY CHEST, ABDOMEN AND PELVIS TECHNIQUE: Non-contrast CT of the chest was initially obtained. Multidetector CT imaging through the chest, abdomen and pelvis was performed using the standard protocol during bolus administration of intravenous contrast. Multiplanar reconstructed images and MIPs were obtained and reviewed to evaluate the vascular anatomy. RADIATION DOSE REDUCTION: This exam was performed according to the departmental dose-optimization program which includes automated exposure control, adjustment of the mA and/or kV according to patient size and/or use of iterative reconstruction technique. CONTRAST:  75mL OMNIPAQUE  IOHEXOL  350 MG/ML SOLN COMPARISON:  12/08/2023 FINDINGS: CTA CHEST FINDINGS Cardiovascular: No significant change in the previously demonstrated intramural hematoma extending from the origin of the left subclavian artery into the abdomen. This continues to cause aneurysmal dilatation of the descending thoracic aorta, measuring 4.9 cm in the proximal descending thoracic aorta on image number 38/21. This measured 4.5 cm on 12/08/2023 and 4.3 cm on 12/06/2023. The heart remains near the upper limit of normal in size. No pericardial effusion. Atheromatous calcifications, including the coronary arteries and aorta. Mediastinum/Nodes: No enlarged mediastinal, hilar, or axillary lymph nodes. Thyroid gland, trachea, and esophagus demonstrate no significant findings. Lungs/Pleura: Interval small amount of patchy density in the posterior right upper lobe. No significant change in bilateral lower lobe atelectasis. Small to moderate-sized left pleural effusion, increased. Interval small right pleural effusion. Musculoskeletal: Thoracic spine degenerative changes. Review of the MIP images confirms the above findings. CTA ABDOMEN AND PELVIS FINDINGS VASCULAR Aorta: The intramural hematoma extends throughout the abdominal aorta, without  significant change in without aneurysm. Celiac: Patent without evidence of aneurysm, dissection, vasculitis or significant stenosis. SMA: Patent without evidence of aneurysm, dissection, vasculitis or significant stenosis. Renals: Both renal arteries are patent without evidence of aneurysm, dissection, vasculitis, fibromuscular dysplasia or significant stenosis. IMA: Stable non flow-limiting narrowing at the origin by the aortic intramural hematoma. Inflow: Stable intramural hematoma extending into the right common iliac artery and currently ending in the distal right common iliac artery. This previously ended in the right external iliac artery. Atheromatous calcifications. No significant luminal stenosis. Veins: No obvious venous abnormality within the limitations of this arterial phase study. Review of the MIP images confirms the above findings. NON-VASCULAR Hepatobiliary: No focal liver abnormality is seen. No gallstones, gallbladder wall thickening, or biliary dilatation. Pancreas: Unremarkable. No pancreatic ductal dilatation or surrounding inflammatory changes. Spleen: Normal in size without focal abnormality. Adrenals/Urinary Tract: Normal-appearing adrenal glands. Small simple appearing right renal cysts not requiring imaging follow-up. Unremarkable left kidney, ureters and urinary bladder. Mild bilateral perinephric edema. Stomach/Bowel: No contrast in the stomach or bowel to indicate active gastrointestinal bleeding. Interval dilatation of the stomach and mild dilatation of the proximal and mid small bowel loops with some low-density wall thickening in the pelvis. The distal loops are normal in caliber as is the colon. No stool visible in the colon. There is fluid throughout the stomach, small bowel and colon. Normal-appearing appendix. Lymphatic: No enlarged lymph nodes. Reproductive: Stable surgical clips in the region of the prostate gland and minimal enlargement of the prostate gland. Other: No free  peritoneal fluid. Diffuse subcutaneous edema. Large right inguinal  hernia containing fat and fluid. Moderate-sized left inguinal hernia containing fat. Small umbilical hernia containing fat. Musculoskeletal: Lumbar spine degenerative changes. Review of the MIP images confirms the above findings. IMPRESSION: 1. No significant change in the previously demonstrated intramural hematoma extending from the origin of the left subclavian artery into the abdomen and right common and external iliac arteries. 2. Interval increase in aneurysmal dilatation of the proximal descending thoracic aorta, measuring 4.9 cm in diameter. 3. Interval small amount of patchy density in the posterior right upper lobe, compatible with pneumonia. 4. Interval small right pleural effusion and increased size of the previously demonstrated small left pleural effusion, currently small to moderate in size. 5. Interval dilatation of the stomach and mild dilatation of the proximal and mid small bowel loops with some low-density wall thickening in the pelvis. This is compatible with enteritis with partial small bowel obstruction or gastric and small-bowel ileus. 6. No evidence of active gastrointestinal bleeding. 7. Large right inguinal hernia containing fat and fluid, moderate-sized left inguinal hernia containing fat and small umbilical hernia containing fat. 8.  Calcific coronary artery and aortic atherosclerosis. Aortic Atherosclerosis (ICD10-I70.0). Electronically Signed   By: Elspeth Bathe M.D.   On: 12/20/2023 12:02   DG CHEST PORT 1 VIEW Result Date: 12/19/2023 CLINICAL DATA:  Pneumonia. EXAM: PORTABLE CHEST 1 VIEW COMPARISON:  Chest radiograph dated 12/14/2023. FINDINGS: Interval advancement of the left-sided PICC with tip close to cavoatrial junction. Enteric tube extends below the diaphragm with tip beyond the inferior margin of the image. Shallow inspiration. Left lung base atelectasis or pneumonia. No pleural effusion pneumothorax. Stable  cardiac silhouette no acute osseous pathology. IMPRESSION: 1. Interval advancement of the left-sided PICC with tip close to cavoatrial junction. 2. Left lung base atelectasis or pneumonia. Electronically Signed   By: Vanetta Chou M.D.   On: 12/19/2023 13:45   DG Abd 1 View Result Date: 12/19/2023 CLINICAL DATA:  Ileus. EXAM: ABDOMEN - 1 VIEW COMPARISON:  Abdominal radiograph dated 12/14/2023. FINDINGS: Diffuse air distension of the small bowel relatively similar to prior radiograph. Degenerative changes of the spine. No acute osseous pathology. IMPRESSION: Persistent ileus. Electronically Signed   By: Vanetta Chou M.D.   On: 12/19/2023 13:44    Review of Systems negative except above Blood pressure 109/62, pulse 90, temperature 98.5 F (36.9 C), temperature source Oral, resp. rate (!) 25, height 5' 10 (1.778 m), weight 90.2 kg, SpO2 94%. Physical Exam vital signs stable afebrile no acute distress exam please see preprocedure assessment labs reviewed UN above her baseline hemoglobin dropped from 11 3 days ago to 5.3 INR up a little as was D-dimer  Assessment/Plan: Upper GI bleeding in patient with an NG tube for a week or 2 Plan: The risk benefits methods of endoscopy was discussed with the patient and his wife and we compared it to the colonoscopies he has had and will proceed today with moderate sedation since anesthesia is not available at this time  [facility] 12/20/2023, 4:48 PM

## 2023-12-20 NOTE — Progress Notes (Addendum)
 eLink Physician-Brief Progress Note Patient Name: Nicholas Burke DOB: Jan 11, 1946 MRN: [id number]   Date of Service  12/20/2023  HPI/Events of Note  Seems to be chronic with every breath, more in the inspiratory no obvious stridor.  No cough, head of bed is elevated.  NG tube was removed and he has had a similar breathing pattern since.  No obvious nidus of bleeding on EGD, concern for tube associated trauma  eICU Interventions  Maintain current plan of care intervention for now unless he has obvious aspiration or change in respiratory status.   2310 - Hg 7.1, trend reviewed, may need transfusion with next check, continue observation. Coags non-actionable. Remains sonorous.     Intervention Category Intermediate Interventions: Respiratory distress - evaluation and management  Martine Trageser 12/20/2023, 9:36 PM

## 2023-12-20 NOTE — Progress Notes (Signed)
 PT Cancellation Note  Patient Details Name: Nicholas Burke MRN: [id number] DOB: [birth year]   Cancelled Treatment:    Reason Eval/Treat Not Completed: Other (comment)  Spoke with RN. Requested to hold PT visit today. Pt having frequent bloody stools, planning to get another transfusion of blood shortly. Will continue to follow acutely.  Nicholas Burke, PT, DPT [facility]  Rehabilitation Services Physical Therapist Office: [phone] Website: [email or web address]   Nicholas Burke 12/20/2023, 2:33 PM

## 2023-12-20 NOTE — Op Note (Signed)
 [facility] Patient Name: Nicholas Burke Procedure Date : 12/20/2023 MRN: [id number] Attending MD: Oliva Boots , MD, [id number] Date of Birth: [birth year] CSN: [id number] Age: 78 Admit Type: Inpatient Procedure:                Upper GI endoscopy Indications:              Acute post hemorrhagic anemia, Coffee-ground                            material in NG tube, Melena Providers:                Oliva Boots, MD, Hoy Penner, RN, Lorrayne Kitty,                            Technician Referring MD:              Medicines:                Fentanyl  30 micrograms IV, Midazolam  3 mg IV,                            Cetacaine  spray Complications:            No immediate complications. Estimated Blood Loss:     Estimated blood loss: none. Procedure:                Pre-Anesthesia Assessment:                           - Prior to the procedure, a History and Physical                            was performed, and patient medications and                            allergies were reviewed. The patient's tolerance of                            previous anesthesia was also reviewed. The risks                            and benefits of the procedure and the sedation                            options and risks were discussed with the patient.                            All questions were answered, and informed consent                            was obtained. Prior Anticoagulants: The patient has                            taken heparin , last dose was day of procedure. ASA  Grade Assessment: III - A patient with severe                            systemic disease. After reviewing the risks and                            benefits, the patient was deemed in satisfactory                            condition to undergo the procedure.                           After obtaining informed consent, the endoscope was                            passed under direct vision. Throughout  the                            procedure, the patient's blood pressure, pulse, and                            oxygen saturations were monitored continuously. The                            [id number]-H190 ([id number]) Olympus endoscope was introduced                            through the mouth, and advanced to the third part                            of duodenum. The upper GI endoscopy was                            accomplished without difficulty. The patient                            tolerated the procedure well. Scope In: Scope Out: Findings:      The larynx was normal.      A Tiny hiatal hernia was present. With minimal NG tube trauma in the       distal esophagus      old clotted blood was found in the gastric fundus and in the gastric       body. We were unable to suction it out but the mid and distal stomach       was fine and no active bleeding was seen      The duodenal bulb, first portion of the duodenum, second portion of the       duodenum and third portion of the duodenum were normal.      The exam was otherwise without abnormality. Impression:               - Normal larynx.                           - Tiny hiatal hernia.                           -  Clotted blood in the gastric fundus and in the                            proximal gastric body.                           - Normal duodenal bulb, first portion of the                            duodenum, second portion of the duodenum and third                            portion of the duodenum.                           - The examination was otherwise normal.                           - No specimens collected. Recommendation:           - NPO today.                           - Continue present medications.                           - Repeat upper endoscopy PRN If signs of bleeding                            continue.                           - Telephone GI clinic if symptomatic PRN. Will                            check on  tomorrow                           - Check PT/INR With next blood draw and if INR                            greater than 1.5 consider vitamin K or FFP. Procedure Code(s):        --- Professional ---                           [phone], Esophagogastroduodenoscopy, flexible,                            transoral; diagnostic, including collection of                            specimen(s) by brushing or washing, when performed                            (separate procedure) Diagnosis Code(s):        --- Professional ---  K44.9, Diaphragmatic hernia without obstruction or                            gangrene                           K92.2, Gastrointestinal hemorrhage, unspecified                           D62, Acute posthemorrhagic anemia                           K92.0, Hematemesis                           K92.1, Melena (includes Hematochezia) CPT copyright 2022 American Medical Association. All rights reserved. The codes documented in this report are preliminary and upon coder review may  be revised to meet current compliance requirements. Oliva Boots, MD 12/20/2023 5:44:50 PM This report has been signed electronically. Number of Addenda: 0

## 2023-12-20 NOTE — Progress Notes (Signed)
 Went for CTA, read pending.   Has starting having melena (which aligns w what wife was describing from overnight BM), bloodied OGT output as noted on exam this morning    Have paged out to [facility] GI for reconsultation re GIB    Ronnald Gave MSN, AGACNP-BC [facility] Pulmonary/Critical Care Medicine 12/20/2023, 11:54 AM

## 2023-12-20 NOTE — Progress Notes (Signed)
 eLink Physician-Brief Progress Note Patient Name: Nicholas Burke DOB: 1946-01-30 MRN: [id number]   Date of Service  12/20/2023  HPI/Events of Note  Hemoglobin this morning is 5.3 had a melanotic stool earlier 50-75 cc of bloody NG drainage yesterday   eICU Interventions  Transfused 2 units PRBCs.  Trend hemoglobin every 6 hours.  Coagulation studies sent.  May need to repeat CT angiography of the abdomen/pelvis if bleeding persists.     Intervention Category Intermediate Interventions: Bleeding - evaluation and treatment with blood products  Tayjah Lobdell 12/20/2023, 5:11 AM

## 2023-12-20 NOTE — Progress Notes (Signed)
 PHARMACY - TOTAL PARENTERAL NUTRITION CONSULT NOTE   Indication: Prolonged ileus  Patient Measurements: Height: 5' 10 (177.8 cm) Weight: 90.2 kg (198 lb 13.7 oz) IBW/kg (Calculated) : 73 TPN AdjBW (KG): 90.7 Body mass index is 28.53 kg/m. Usual Weight: 91.2 kg   Assessment:  Patient is a 78 yo M admitted with aortic dissection with intramural hematoma. Patient developed severe ileus secondary to dissection. Patient was active and had good nutrition PTA. Without nutrition for 7 days at the start of TPN. Patient at moderate risk of refeeding.   Glucose / Insulin : A1c 7.1, CBGs <180, SSI 10u in 24h  Electrolytes: Na 139; K 5.0 (110 mEq outside TPN, goal 4-4.5 per primary), CoCa 9., Phos 3.0, Mag 2.5, Cl 113, CO2 21 Renal: Scr incr 0.97 to 1.35, BUN up 34 Hepatic: AST/ALT wnl, T bili 0.9, Alb <1.5 Intake / Output; MIVF: UOP 0.5 ml/kg/hr, NG -260 mL, net +16.6L since admission, LR@75mL /hr  GI Imaging:  8/11 CT abd- Extensive intramural hematoma involving the descending thoracic aorta extending throughout the abdominal aorta and into the right inflow vessels terminating in the mid right external iliac artery. 8/14 DG abd- There is increased amount of gas throughout the small bowel and colon, likely manifestation of adynamic ileus 8/17 DG abd- Persistent but improved gaseous gastric distension  8/19 DG abd- unchanged 8/24 DG abd: Diffuse air distension of the small bowel relatively similar to prior radiograph  GI Surgeries / Procedures: N/A  Central access: PICC  TPN start date: 8/19  Nutritional Goals: Goal TPN rate is 80 mL/hr (provides 100 g of protein and 1994 kcals per day)  RD Assessment: Estimated Needs Total Energy Estimated Needs: 1900-2100 kcals Total Protein Estimated Needs: 95-105 g Total Fluid Estimated Needs: >/= 2L (not taking into account high NG output)  Current Nutrition:  NPO   Plan: Continue TPN at 80 mL/hr which provides 100% of estimated daily caloric  needs  Electrolytes in TPN (slowly adding back due to resolving AKI),: Na to 20 mEq/L, decrease K to 25 mEq/L, Ca 2 mEq/L, decrease Mg 8 mEq/L, Phos 10 mmol/L. Cl:Ac max acetate Add standard MVI and trace elements to TPN Increase Moderate SSI to q4h and adjust as needed  Add insulin  regular 10 units to TPN Thiamine  x 5 days completed  Monitor TPN labs on Mon/Thurs Follow up CTA result   Thank you for allowing pharmacy to be a part of this patient's care.  Vermell Mccallum, PharmD CCM Pharmacy Resident

## 2023-12-20 NOTE — Plan of Care (Signed)
  Problem: Coping: Goal: Ability to adjust to condition or change in health will improve Outcome: Progressing   Problem: Nutritional: Goal: Maintenance of adequate nutrition will improve Outcome: Progressing   Problem: Skin Integrity: Goal: Risk for impaired skin integrity will decrease Outcome: Progressing   Problem: Tissue Perfusion: Goal: Adequacy of tissue perfusion will improve Outcome: Progressing   Problem: Health Behavior/Discharge Planning: Goal: Ability to manage health-related needs will improve Outcome: Progressing   Problem: Coping: Goal: Level of anxiety will decrease Outcome: Progressing   Problem: Elimination: Goal: Will not experience complications related to bowel motility Outcome: Not Progressing Goal: Will not experience complications related to urinary retention Outcome: Progressing   Problem: Pain Managment: Goal: General experience of comfort will improve and/or be controlled Outcome: Progressing   Problem: Safety: Goal: Ability to remain free from injury will improve Outcome: Progressing   Problem: Skin Integrity: Goal: Risk for impaired skin integrity will decrease Outcome: Progressing

## 2023-12-21 ENCOUNTER — Inpatient Hospital Stay (HOSPITAL_COMMUNITY)

## 2023-12-21 ENCOUNTER — Encounter (HOSPITAL_COMMUNITY): Payer: Self-pay | Admitting: Gastroenterology

## 2023-12-21 DIAGNOSIS — K567 Ileus, unspecified: Secondary | ICD-10-CM | POA: Diagnosis not present

## 2023-12-21 DIAGNOSIS — D62 Acute posthemorrhagic anemia: Secondary | ICD-10-CM | POA: Diagnosis not present

## 2023-12-21 DIAGNOSIS — I7103 Dissection of thoracoabdominal aorta: Secondary | ICD-10-CM | POA: Diagnosis not present

## 2023-12-21 DIAGNOSIS — K922 Gastrointestinal hemorrhage, unspecified: Secondary | ICD-10-CM | POA: Diagnosis not present

## 2023-12-21 LAB — CBC
HCT: 21.8 % — ABNORMAL LOW (ref 39.0–52.0)
HCT: 22.5 % — ABNORMAL LOW (ref 39.0–52.0)
HCT: 21.6 % — ABNORMAL LOW (ref 39.0–52.0)
HCT: 21.8 % — ABNORMAL LOW (ref 39.0–52.0)
Hemoglobin: 6.9 g/dL — CL (ref 13.0–17.0)
Hemoglobin: 7.2 g/dL — ABNORMAL LOW (ref 13.0–17.0)
Hemoglobin: 7.1 g/dL — ABNORMAL LOW (ref 13.0–17.0)
Hemoglobin: 7.1 g/dL — ABNORMAL LOW (ref 13.0–17.0)
MCH: 28.2 pg (ref 26.0–34.0)
MCH: 28.5 pg (ref 26.0–34.0)
MCH: 29.7 pg (ref 26.0–34.0)
MCH: 29.8 pg (ref 26.0–34.0)
MCHC: 31.7 g/dL (ref 30.0–36.0)
MCHC: 32 g/dL (ref 30.0–36.0)
MCHC: 32.9 g/dL (ref 30.0–36.0)
MCHC: 32.6 g/dL (ref 30.0–36.0)
MCV: 89 fL (ref 80.0–100.0)
MCV: 88.9 fL (ref 80.0–100.0)
MCV: 90.4 fL (ref 80.0–100.0)
MCV: 91.6 fL (ref 80.0–100.0)
Platelets: 204 K/uL (ref 150–400)
Platelets: 218 K/uL (ref 150–400)
Platelets: 208 K/uL (ref 150–400)
Platelets: 214 K/uL (ref 150–400)
RBC: 2.45 MIL/uL — ABNORMAL LOW (ref 4.22–5.81)
RBC: 2.53 MIL/uL — ABNORMAL LOW (ref 4.22–5.81)
RBC: 2.39 MIL/uL — ABNORMAL LOW (ref 4.22–5.81)
RBC: 2.38 MIL/uL — ABNORMAL LOW (ref 4.22–5.81)
RDW: 15.4 % (ref 11.5–15.5)
RDW: 16.1 % — ABNORMAL HIGH (ref 11.5–15.5)
RDW: 16.4 % — ABNORMAL HIGH (ref 11.5–15.5)
RDW: 16.8 % — ABNORMAL HIGH (ref 11.5–15.5)
WBC: 14.8 K/uL — ABNORMAL HIGH (ref 4.0–10.5)
WBC: 12.4 K/uL — ABNORMAL HIGH (ref 4.0–10.5)
WBC: 11.1 K/uL — ABNORMAL HIGH (ref 4.0–10.5)
WBC: 10.9 K/uL — ABNORMAL HIGH (ref 4.0–10.5)
nRBC: 0 % (ref 0.0–0.2)
nRBC: 0 % (ref 0.0–0.2)
nRBC: 0 % (ref 0.0–0.2)
nRBC: 0.2 % (ref 0.0–0.2)

## 2023-12-21 LAB — MAGNESIUM: Magnesium: 2.2 mg/dL (ref 1.7–2.4)

## 2023-12-21 LAB — DIC (DISSEMINATED INTRAVASCULAR COAGULATION)PANEL
D-Dimer, Quant: 3.98 ug{FEU}/mL — ABNORMAL HIGH (ref 0.00–0.50)
Fibrinogen: 437 mg/dL (ref 210–475)
INR: 1.3 — ABNORMAL HIGH (ref 0.8–1.2)
Platelets: 232 K/uL (ref 150–400)
Prothrombin Time: 16.8 s — ABNORMAL HIGH (ref 11.4–15.2)
Smear Review: NONE SEEN
aPTT: 45 s — ABNORMAL HIGH (ref 24–36)

## 2023-12-21 LAB — RENAL FUNCTION PANEL
Albumin: <1.5 g/dL — ABNORMAL LOW (ref 3.5–5.0)
Anion gap: 5 (ref 5–15)
BUN: 33 mg/dL — ABNORMAL HIGH (ref 8–23)
CO2: 22 mmol/L (ref 22–32)
Calcium: 7.1 mg/dL — ABNORMAL LOW (ref 8.9–10.3)
Chloride: 113 mmol/L — ABNORMAL HIGH (ref 98–111)
Creatinine, Ser: 1.05 mg/dL (ref 0.61–1.24)
GFR, Estimated: >60 mL/min (ref >60)
Glucose, Bld: 185 mg/dL — ABNORMAL HIGH (ref 70–99)
Phosphorus: 2.8 mg/dL (ref 2.5–4.6)
Potassium: 4.7 mmol/L (ref 3.5–5.1)
Sodium: 140 mmol/L (ref 135–145)

## 2023-12-21 LAB — GLUCOSE, CAPILLARY
Glucose-Capillary: 118 mg/dL — ABNORMAL HIGH (ref 70–99)
Glucose-Capillary: 136 mg/dL — ABNORMAL HIGH (ref 70–99)
Glucose-Capillary: 138 mg/dL — ABNORMAL HIGH (ref 70–99)
Glucose-Capillary: 141 mg/dL — ABNORMAL HIGH (ref 70–99)
Glucose-Capillary: 142 mg/dL — ABNORMAL HIGH (ref 70–99)
Glucose-Capillary: 147 mg/dL — ABNORMAL HIGH (ref 70–99)
Glucose-Capillary: 147 mg/dL — ABNORMAL HIGH (ref 70–99)
Glucose-Capillary: 149 mg/dL — ABNORMAL HIGH (ref 70–99)

## 2023-12-21 LAB — HEMOGLOBIN AND HEMATOCRIT, BLOOD
HCT: 22 % — ABNORMAL LOW (ref 39.0–52.0)
Hemoglobin: 7 g/dL — ABNORMAL LOW (ref 13.0–17.0)

## 2023-12-21 LAB — PROTIME-INR
INR: 1.2 (ref 0.8–1.2)
Prothrombin Time: 16.1 s — ABNORMAL HIGH (ref 11.4–15.2)

## 2023-12-21 MED ORDER — NEOSTIGMINE METHYLSULFATE 10 MG/10ML IV SOLN
0.2500 mg | Freq: Four times a day (QID) | INTRAVENOUS | Status: AC
  Administered 2023-12-21 – 2023-12-23 (×8): 0.25 mg via SUBCUTANEOUS
  Filled 2023-12-21 (×8): qty 0.25

## 2023-12-21 MED ORDER — CALCIUM GLUCONATE-NACL 1-0.675 GM/50ML-% IV SOLN
1.0000 g | Freq: Once | INTRAVENOUS | Status: AC
  Administered 2023-12-21: 1000 mg via INTRAVENOUS
  Filled 2023-12-21: qty 50

## 2023-12-21 MED ORDER — FUROSEMIDE 10 MG/ML IJ SOLN
40.0000 mg | Freq: Once | INTRAMUSCULAR | Status: AC
  Administered 2023-12-21: 40 mg via INTRAVENOUS
  Filled 2023-12-21: qty 4

## 2023-12-21 MED ORDER — TRAVASOL 10 % IV SOLN
INTRAVENOUS | Status: AC
  Filled 2023-12-21: qty 998.4

## 2023-12-21 NOTE — TOC Progression Note (Addendum)
 Transition of Care [facility]) - Progression Note    Patient Details  Name: Nicholas Burke MRN: [id number] Date of Birth: 12-Jun-1945  [facility] of [facility]) CM/SW Contact  Graves-Bigelow, Erminio Deems, RN Phone Number: 12/21/2023, 4:21 PM  Clinical Narrative:  Patient post Type B aortic dissection- post ileus-GI is following. Case Manager spoke with spouse and she did not want to make a decision on home health at this time. Case Manager will discuss home health services with the patient as he gets closer to transition home. Case Manager will continue to follow as the patient progresses.   Expected Discharge Plan: Home w Home Health Services Barriers to Discharge: Continued Medical Work up  Expected Discharge Plan and Services In-house Referral: NA Discharge Planning Services: CM Consult Post Acute Care Choice: NA Living arrangements for the past 2 months: Single Family Home                   DME Agency: NA  Social Drivers of Health (SDOH) Interventions SDOH Screenings   Food Insecurity: No Food Insecurity (12/06/2023)  Housing: Low Risk  (12/06/2023)  Transportation Needs: No Transportation Needs (12/06/2023)  Utilities: Not At Risk (12/06/2023)  Social Connections: Socially Integrated (12/06/2023)  Tobacco Use: Low Risk  (12/20/2023)    Readmission Risk Interventions     No data to display

## 2023-12-21 NOTE — Progress Notes (Signed)
 Nicholas Burke 3:53 PM  Subjective: Patient doing better than yesterday no signs of continued bleeding seems to be okay without NG only passing minimal gas and case discussed with family member and we rediscussed yesterday's procedure  Objective: Vital signs stable afebrile no acute distress abdomen is soft nontender hemoglobin stable BUN down a little  Assessment: Upper GI bleed questionable etiology  Plan: Sips of clear liquids okay with me and replace NG as needed nausea vomiting or significant distention and consider repeat endoscopy to reevaluate the stomach just to be sure and will check on tomorrow  [facility]  office [phone] After 5PM or if no answer call [phone]

## 2023-12-21 NOTE — Progress Notes (Signed)
 PHARMACY - TOTAL PARENTERAL NUTRITION CONSULT NOTE   Indication: Prolonged ileus  Patient Measurements: Height: 5' 10 (177.8 cm) Weight: 102 kg (224 lb 13.9 oz) IBW/kg (Calculated) : 73 TPN AdjBW (KG): 90.7 Body mass index is 32.27 kg/m. Usual Weight: 91.2 kg   Assessment:  Patient is a 78 yo M admitted with aortic dissection with intramural hematoma. Patient developed severe ileus secondary to dissection. Patient was active and had good nutrition PTA. Without nutrition for 7 days at the start of TPN. Patient at moderate risk of refeeding.   Glucose / Insulin : A1c 7.1, CBGs >180 x 1, SSI 7u in 24h  Electrolytes: Na 140; K 4.7 (goal 4-4.5 per primary), CoCa 9.1, Phos 2.8, Mag 2.2, Cl 113, CO2 22 Renal: Scr normalizing 1.05, BUN 33  Hepatic: AST/ALT wnl, T bili 0.9, Alb <1.5 Intake / Output; MIVF: UOP 1 ml/kg/hr, net +18.8L since admission, LR@75mL /hr, s/p lasix  40mg  x 1  GI Imaging:  8/11 CT abd- Extensive intramural hematoma involving the descending thoracic aorta extending throughout the abdominal aorta and into the right inflow vessels terminating in the mid right external iliac artery. 8/14 DG abd- There is increased amount of gas throughout the small bowel and colon, likely manifestation of adynamic ileus 8/17 DG abd- Persistent but improved gaseous gastric distension  8/19 DG abd- unchanged 8/24 DG abd: Diffuse air distension of the small bowel relatively similar to prior radiograph  GI Surgeries / Procedures:  8/25 EGD - minimal NG tube trauma, old clotted blood was found in the gastric fundus and in the gastric body  Central access: PICC  TPN start date: 8/19  Nutritional Goals: Goal TPN rate is 80 mL/hr (provides 100 g of protein and 1994 kcals per day)  RD Assessment: Estimated Needs Total Energy Estimated Needs: 1900-2100 kcals Total Protein Estimated Needs: 95-105 g Total Fluid Estimated Needs: >/= 2L (not taking into account high NG output)  Current Nutrition:   NPO   Plan: Continue TPN at 80 mL/hr which provides 100% of estimated daily caloric needs  Electrolytes in TPN: Na to 20 mEq/L, K to 25 mEq/L, Ca 2 mEq/L, Mg 8 mEq/L, Phos 15 mmol/L. Cl:Ac max acetate Add standard MVI and trace elements to TPN Moderate SSI q4h and adjust as needed  Add insulin  regular 10 units to TPN Thiamine  x 5 days completed  Monitor TPN labs on Mon/Thurs Follow up starting feeds  Thank you for allowing pharmacy to be a part of this patient's care.  Vermell Mccallum, PharmD CCM Pharmacy Resident

## 2023-12-21 NOTE — Progress Notes (Signed)
 Physical Therapy Treatment Patient Details Name: Nicholas Burke MRN: [id number] DOB: 03-Jul-1945 Today's Date: 12/21/2023   History of Present Illness Pt is 78 year old presented to [facility] on  12/06/23 for Type B aortic dissection with intramural hematoma. Developed severe ileus due to dissection. Developed acute respiratory failure with hypoxia suspected due to aspiration from nonfunctioning NGT. Developed GIB. PMH - htn, prostate CA    PT Comments  Tolerated session well. Extensive review of LE exercises in bed, progressing to EOB activities and transfer training. Pt required CGA with transfer to [facility] and back. Gait deferred due to GIB with bowel incontinence. Patient will continue to benefit from skilled physical therapy services to further improve independence with functional mobility.     If plan is discharge home, recommend the following: Assist for transportation;Help with stairs or ramp for entrance;Assistance with cooking/housework;A little help with walking and/or transfers;A little help with bathing/dressing/bathroom;Supervision due to cognitive status   Can travel by private vehicle        Equipment Recommendations  Other (comment) (Possibly RW - pending progress)    Recommendations for Other Services       Precautions / Restrictions Precautions Precautions: Other (comment) Recall of Precautions/Restrictions: Intact Precaution/Restrictions Comments: multiple lines/tubes. GIB Restrictions Weight Bearing Restrictions Per Provider Order: No     Mobility  Bed Mobility Overal bed mobility: Needs Assistance Bed Mobility: Rolling, Sidelying to Sit, Sit to Sidelying Rolling: Contact guard assist Sidelying to sit: Contact guard assist     Sit to sidelying: Min assist General bed mobility comments: CGA to roll and rise to EOB. Min assist for light LE suppor back into bed. Managed lines leads and cues for technique.    Transfers Overall transfer level: Needs assistance Equipment  used: Rolling walker (2 wheels) Transfers: Sit to/from Stand, Bed to chair/wheelchair/BSC Sit to Stand: Contact guard assist   Step pivot transfers: Contact guard assist       General transfer comment: CGA to stand from bed and BSC, cues for hand placement and technique. Mild dizziness initially but improved. Step pivot to and from BSC/Bed with RW.    Ambulation/Gait               General Gait Details: deferred due to GIB with bowel incontinence   Stairs             Wheelchair Mobility     Tilt Bed    Modified Rankin (Stroke Patients Only)       Balance Overall balance assessment: Mild deficits observed, not formally tested                                          Communication Communication Communication: No apparent difficulties  Cognition Arousal: Alert Behavior During Therapy: Flat affect   PT - Cognitive impairments: No apparent impairments                         Following commands: Intact      Cueing Cueing Techniques: Verbal cues  Exercises General Exercises - Lower Extremity Ankle Circles/Pumps: AROM, Both, 15 reps, Supine Quad Sets: Strengthening, Both, 10 reps, Supine Gluteal Sets: Strengthening, Both, 10 reps, Supine Short Arc Quad: Strengthening, Both, 10 reps, Supine Long Arc Quad: Strengthening, Both, 10 reps, Seated Heel Slides: Strengthening, Both, 10 reps, Supine Hip ABduction/ADduction: Strengthening, Both, 10 reps, Supine Hip Flexion/Marching: Strengthening, Both,  10 reps, Standing Other Exercises Other Exercises: Sit to stand x5 from edge of bed.    General Comments General comments (skin integrity, edema, etc.): bp 99/58, HR 83, SpO2 96% on 6L.      Pertinent Vitals/Pain Pain Assessment Pain Assessment: No/denies pain    Home Living                          Prior Function            PT Goals (current goals can now be found in the care plan section) Acute Rehab PT  Goals Patient Stated Goal: return home PT Goal Formulation: With patient Time For Goal Achievement: 12/29/23 Potential to Achieve Goals: Good Progress towards PT goals: Progressing toward goals    Frequency    Min 2X/week      PT Plan      Co-evaluation              AM-PAC PT 6 Clicks Mobility   Outcome Measure  Help needed turning from your back to your side while in a flat bed without using bedrails?: None Help needed moving from lying on your back to sitting on the side of a flat bed without using bedrails?: A Little Help needed moving to and from a bed to a chair (including a wheelchair)?: A Little Help needed standing up from a chair using your arms (e.g., wheelchair or bedside chair)?: A Little Help needed to walk in hospital room?: A Little Help needed climbing 3-5 steps with a railing? : A Little 6 Click Score: 19    End of Session Equipment Utilized During Treatment: Oxygen Activity Tolerance: Other (comment);Patient tolerated treatment well (Bowel incontinence in standing.) Patient left: with call bell/phone within reach;with family/visitor present;in bed;with bed alarm set;with SCD's reapplied Nurse Communication: Mobility status (Nurse present) PT Visit Diagnosis: Other abnormalities of gait and mobility (R26.89);Muscle weakness (generalized) (M62.81);Difficulty in walking, not elsewhere classified (R26.2)     Time: 8794-8757 PT Time Calculation (min) (ACUTE ONLY): 37 min  Charges:    $Therapeutic Exercise: 8-22 mins $Therapeutic Activity: 8-22 mins PT General Charges $$ ACUTE PT VISIT: 1 Visit                     Leontine Roads, PT, DPT [facility]  Rehabilitation Services Physical Therapist Office: [phone] Website: [email or web address]    Leontine GORMAN Roads 12/21/2023, 1:36 PM

## 2023-12-21 NOTE — Progress Notes (Signed)
 NAME:  Nicholas Burke, MRN:  [id number], DOB:  Aug 28, 1945, LOS: 15 ADMISSION DATE:  12/06/2023, CONSULTATION DATE:  12/06/2023 REFERRING MD: Dr. Celinda - TRH, CHIEF COMPLAINT: Intramural hematoma  History of Present Illness:  Nicholas Burke is a 78 year old male with a past medical history significant for prediabetes, hypertension, and prostate cancer who presented to the ED at [facility] 8/11 for complaints of epigastric pain.  On ED arrival patient was seen hypertensive with all other vital signs within normal limit.  CT abdomen pelvis obtained and revealed extensive intramural hematoma involving the descending thoracic aorta extending throughout the abdominal aorta and into the right external iliac artery.  CTA chest again confirms intramural hematoma beginning at the transverse aorta origin of the left subclavian artery and extending throughout the thoracic aorta and into the abdominal aorta.  Lab work significant for potassium 2.9.  Vascular surgery evaluated patient in ED and recommended tight blood pressure and heart rate control, PCCM consulted in this setting.   Pertinent  Medical History  prediabetes, hypertension, and prostate cancer  Significant Hospital Events: Including procedures, antibiotic start and stop dates in addition to other pertinent events   8/11 presented with epigastric abdominal pain found to have large intramural hematoma 8/14 remains on esmolol , no indication for urgent surgical intervention 8/15 ongoing issues with ileus 8/16 NGT placed to decompress 8/19 continued high output from NGT. Still on Cleviprex  and esmolol . Seen By GI in consult. Stopped CCB due to risk of constipation. Started TPN, PICC placed. 8/20 new tremor stopped the reglan . Still trying to get control of BP and HR. Started scheduled IV hydral + esmolol . 8/21 Patient became hypotensive, clonidine  patch was removed, hydralazine , esmolol  were stopped 8/22 on low-dose labetalol  infusion 8/23 had multiple small  bowel movements, denies abdominal pain, NG output was 550 cc 8/24 BMs  8/25 hgb drop 5.3, 2 PRBC. AKI  8/25 EGD - Normal larynx. - Tiny hiatal hernia. - Clotted blood in the gastric fundus and in the proximal gastric body. - Normal duodenal bulb, first portion of the duodenum, second portion of the duodenum 8/25 CTA chest/abd/pelvis>>1. No significant change in the previously demonstrated intramural hematoma extending from the origin of the left subclavian artery into the abdomen and right common and external iliac arteries. 2. Interval increase in aneurysmal dilatation of the proximal descending thoracic aorta, measuring 4.9 cm in diameter. 3. Interval small amount of patchy density in the posterior right upper lobe, compatible with pneumonia. 4. Interval small right pleural effusion and increased size of the previously demonstrated small left pleural effusion, currently small to moderate in size.  5. Interval dilatation of the stomach and mild dilatation of the proximal and mid small bowel loops with some low-density wall thickening in the pelvis. This is compatible with enteritis with partial small bowel obstruction or gastric and small-bowel ileus. 6. No evidence of active gastrointestinal bleeding. 7. Large right inguinal hernia containing fat and fluid, moderate-sized left inguinal hernia containing fat and small umbilical hernia containing fat. 8.  Calcific coronary artery and aortic atherosclerosis.  Interim History / Subjective:  EGD yesterday evening - ?tube associated trauma no obvious cause of bleeding.  Ongoing dark red stools overnight per RN Labetalol  gtt on overnight, just turned off. Responded well to PRN hydralazine    Objective    Blood pressure (!) 103/53, pulse 83, temperature 98.6 F (37 C), temperature source Oral, resp. rate (!) 35, height 5' 10 (1.778 m), weight 102 kg, SpO2 96%.  Intake/Output Summary (Last 24 hours) at 12/21/2023 0836 Last data filed at 12/21/2023  0800 Gross per 24 hour  Intake 4914.14 ml  Output 2500 ml  Net 2414.14 ml   Filed Weights   12/19/23 0426 12/20/23 0327 12/21/23 0413  Weight: 90.1 kg 90.2 kg 102 kg    Physical exam General:  acutely ill appearing male, NAD HEENT: MM pink/moist, dry Neuro:  drowsy, wakes easily, appropriate, MAE, gen weakness  CV: s1s2 rrr, no m/r/g PULM:  resps even, non labored although slightly tachypneic, 3L [place] GI: distended, soft, non tender, hypoactive bs  Extremities: warm/dry, scant edema  Skin: no rashes or lesions   Resolved    Lactic acidosis on 8/17 Hypernatremia Hypokalemia HTN emergency  Assessment and Plan   Type B aortic dissection w intramural hematoma  HTN emergency P -goal SBP < 120 and HR <60 ideally  -labetalol  gtt, PRN hydralazine   -Not tol oral meds well r/t ileus   Anemia  GI bleeding -no obvious source of bleed on EGD 8/25 P -serial h&h -transfuse PRN  -keeping NG tube out unless hematemesis -SCD -GI following    Refractory Ileus GIB (8/25)  Refeeding syndrome P -cont bowel reg - on erythromycin  dulcolax -NGT LIWS  -on TPN until able to meet nutritional needs  - PPI  - GI following  -optimize lytes  -mobilize   Acute hypoxic resp failure LLL ASD -- PNA v atelectasis C/f aspiration PNA v HCAP  P -f/u CXR  -aggressive pulm hygiene -continue unasyn  for ?aspiration PNA - 5 days with stop date  -O2 as needed goal > 92  - mobilize  - continue to assess L>R effusion - holding off on thoracentesis as able given nearby dissection   AKI  stable P -trend chem    DM2 -SSI   Hx prostate cancer; in remission; had local radiation treatment Outpatient follow-up with oncology   Pt and wife updated at bedside 8/26  CRITICAL CARE Performed by: Lamarr Myers   Total critical care time: 33 minutes  Critical care time was exclusive of separately billable procedures and treating other patients. Critical care was necessary to treat or  prevent imminent or life-threatening deterioration.  Critical care was time spent personally by me on the following activities: development of treatment plan with patient and/or surrogate as well as nursing, discussions with consultants, evaluation of patient's response to treatment, examination of patient, obtaining history from patient or surrogate, ordering and performing treatments and interventions, ordering and review of laboratory studies, ordering and review of radiographic studies, pulse oximetry and re-evaluation of patient's condition.  Rockie Myers, NP Pulmonary/Critical Care Medicine  12/21/2023  8:36 AM   See Tracey for personal pager PCCM on call pager [phone] until 7pm. Please call Elink 7p-7a. [phone]

## 2023-12-21 NOTE — Plan of Care (Signed)

## 2023-12-21 NOTE — Progress Notes (Signed)
 Nutrition Follow-up  DOCUMENTATION CODES:   Non-severe (moderate) malnutrition in context of acute illness/injury  INTERVENTION:   Continue TPN to meet nutritional needs   NUTRITION DIAGNOSIS:   Moderate Malnutrition related to acute illness (Type B aortic dissection with hematoma causing prolonged SB ileus) as evidenced by energy intake < 75% for > 7 days, mild fat depletion, mild muscle depletion.  Being addressed via TPN  GOAL:   Patient will meet greater than or equal to 90% of their needs  Progressing  MONITOR:   Diet advancement, Weight trends, Labs, Skin, I & O's (TPN)  REASON FOR ASSESSMENT:   Consult New TPN/TNA  ASSESSMENT:   78 yo male admitted with type B aortic dissection with intramural hematoma likely secondary to poorly controlled HTN; +HTN emergency requiring cleviprex . +AKI. Pt developed severe ileus due to dissection.   PMH includes DM, HTN, prostate cancer  8/11 Admitted with abd pain, +large intramural hematoma, type B aortic dissection 8/16 NGT inserted for decompression 8/18 Abd xray with improved gaseous distention of stomach, Diffuse gaseous distention of small bowel evident with small bowel loops measuring up to 4.4 cm diameter in the mid abdomen 8/19 TPN intiiated 8/20 New tremor, reglan  stopped 8/21 Code Stroke, hypotensive and confused 8/25 EGD: Clotted blood in gastric fundus and proximal gastric body  TPN at 80 ml/hr providing 100 g of protein and 1994 kcals   NG remains out post EGD. Pt denies nausea or abd pain/discomfort Remains NPO   Hgb dropped to 5.2 yesterday, received PRBCs. EGD with clotted blood in stomach but no obvious source of bleeding.  +black BM this AM  Current wt 102 kg, lowest wt 88.7 kg. Admission wt around 90 kg  Labs: Sodium 140 (wdl) Potassium 4.7 (wdl) BUN 33 Creatinine 1.05 Albumin  <1.5 Corrected calcium  9.2  CBGs 110-147 (goal <180)  Meds:  Dulcolax  Lasix  x 1 SS novolog  Neostigmine   IV  Unasyn   NUTRITION - FOCUSED PHYSICAL EXAM:  Repeat exam looks similar to previous exam except pt more edematous  Diet Order:   Diet Order             Diet NPO time specified Except for: Sips with Meds  Diet effective midnight                   EDUCATION NEEDS:   Education needs have been addressed  Skin:  Skin Assessment: Reviewed RN Assessment  Last BM:  8/26 large BM  Height:   Ht Readings from Last 1 Encounters:  12/06/23 5' 10 (1.778 m)    Weight:   Wt Readings from Last 1 Encounters:  12/21/23 102 kg    BMI:  Body mass index is 32.27 kg/m.  Estimated Nutritional Needs:   Kcal:  1900-2100 kcals  Protein:  95-105 g  Fluid:  >/= 2L (not taking into account high NG output)   Betsey Finger MS, RDN, LDN, CNSC Registered Dietitian 3 Clinical Nutrition RD Inpatient Contact Info in Amion

## 2023-12-22 DIAGNOSIS — K567 Ileus, unspecified: Secondary | ICD-10-CM | POA: Diagnosis not present

## 2023-12-22 DIAGNOSIS — D62 Acute posthemorrhagic anemia: Secondary | ICD-10-CM | POA: Diagnosis not present

## 2023-12-22 DIAGNOSIS — I7103 Dissection of thoracoabdominal aorta: Secondary | ICD-10-CM | POA: Diagnosis not present

## 2023-12-22 DIAGNOSIS — K922 Gastrointestinal hemorrhage, unspecified: Secondary | ICD-10-CM | POA: Diagnosis not present

## 2023-12-22 LAB — CBC
HCT: 19.9 % — ABNORMAL LOW (ref 39.0–52.0)
HCT: 22.4 % — ABNORMAL LOW (ref 39.0–52.0)
HCT: 25.1 % — ABNORMAL LOW (ref 39.0–52.0)
Hemoglobin: 6.4 g/dL — CL (ref 13.0–17.0)
Hemoglobin: 7.3 g/dL — ABNORMAL LOW (ref 13.0–17.0)
Hemoglobin: 8 g/dL — ABNORMAL LOW (ref 13.0–17.0)
MCH: 29.1 pg (ref 26.0–34.0)
MCH: 30.4 pg (ref 26.0–34.0)
MCH: 32.5 pg (ref 26.0–34.0)
MCHC: 31.9 g/dL (ref 30.0–36.0)
MCHC: 32.2 g/dL (ref 30.0–36.0)
MCHC: 32.6 g/dL (ref 30.0–36.0)
MCV: 102 fL — ABNORMAL HIGH (ref 80.0–100.0)
MCV: 90.5 fL (ref 80.0–100.0)
MCV: 93.3 fL (ref 80.0–100.0)
Platelets: 200 K/uL (ref 150–400)
Platelets: 215 K/uL (ref 150–400)
Platelets: 235 K/uL (ref 150–400)
RBC: 2.2 MIL/uL — ABNORMAL LOW (ref 4.22–5.81)
RBC: 2.4 MIL/uL — ABNORMAL LOW (ref 4.22–5.81)
RBC: 2.46 MIL/uL — ABNORMAL LOW (ref 4.22–5.81)
RDW: 16.8 % — ABNORMAL HIGH (ref 11.5–15.5)
RDW: 16.9 % — ABNORMAL HIGH (ref 11.5–15.5)
RDW: 18.4 % — ABNORMAL HIGH (ref 11.5–15.5)
WBC: 10 K/uL (ref 4.0–10.5)
WBC: 9 K/uL (ref 4.0–10.5)
WBC: 9.3 K/uL (ref 4.0–10.5)
nRBC: 0.2 % (ref 0.0–0.2)
nRBC: 0.2 % (ref 0.0–0.2)
nRBC: 0.4 % — ABNORMAL HIGH (ref 0.0–0.2)

## 2023-12-22 LAB — PROTIME-INR
INR: 1.2 (ref 0.8–1.2)
INR: 1.2 (ref 0.8–1.2)
Prothrombin Time: 15.5 s — ABNORMAL HIGH (ref 11.4–15.2)
Prothrombin Time: 16.3 s — ABNORMAL HIGH (ref 11.4–15.2)

## 2023-12-22 LAB — RENAL FUNCTION PANEL
Albumin: 1.7 g/dL — ABNORMAL LOW (ref 3.5–5.0)
Anion gap: 5 (ref 5–15)
BUN: 25 mg/dL — ABNORMAL HIGH (ref 8–23)
CO2: 23 mmol/L (ref 22–32)
Calcium: 7.4 mg/dL — ABNORMAL LOW (ref 8.9–10.3)
Chloride: 111 mmol/L (ref 98–111)
Creatinine, Ser: 1.17 mg/dL (ref 0.61–1.24)
GFR, Estimated: >60 mL/min (ref >60)
Glucose, Bld: 177 mg/dL — ABNORMAL HIGH (ref 70–99)
Phosphorus: 3.8 mg/dL (ref 2.5–4.6)
Potassium: 4.2 mmol/L (ref 3.5–5.1)
Sodium: 139 mmol/L (ref 135–145)

## 2023-12-22 LAB — GLUCOSE, CAPILLARY
Glucose-Capillary: 139 mg/dL — ABNORMAL HIGH (ref 70–99)
Glucose-Capillary: 138 mg/dL — ABNORMAL HIGH (ref 70–99)
Glucose-Capillary: 147 mg/dL — ABNORMAL HIGH (ref 70–99)
Glucose-Capillary: 121 mg/dL — ABNORMAL HIGH (ref 70–99)
Glucose-Capillary: 122 mg/dL — ABNORMAL HIGH (ref 70–99)

## 2023-12-22 LAB — MAGNESIUM: Magnesium: 2.2 mg/dL (ref 1.7–2.4)

## 2023-12-22 LAB — PREPARE RBC (CROSSMATCH)

## 2023-12-22 MED ORDER — CALCIUM GLUCONATE-NACL 2-0.675 GM/100ML-% IV SOLN
2.0000 g | Freq: Once | INTRAVENOUS | Status: DC
  Filled 2023-12-22: qty 100

## 2023-12-22 MED ORDER — CALCIUM GLUCONATE-NACL 1-0.675 GM/50ML-% IV SOLN
1.0000 g | Freq: Once | INTRAVENOUS | Status: AC
  Administered 2023-12-22: 1000 mg via INTRAVENOUS
  Filled 2023-12-22: qty 50

## 2023-12-22 MED ORDER — INSULIN ASPART 100 UNIT/ML IJ SOLN
0.0000 [IU] | INTRAMUSCULAR | Status: DC
  Administered 2023-12-22 – 2023-12-24 (×9): 1 [IU] via SUBCUTANEOUS
  Administered 2023-12-24: 2 [IU] via SUBCUTANEOUS
  Administered 2023-12-24 – 2023-12-25 (×6): 1 [IU] via SUBCUTANEOUS
  Administered 2023-12-26 (×4): 2 [IU] via SUBCUTANEOUS
  Administered 2023-12-26: 1 [IU] via SUBCUTANEOUS
  Administered 2023-12-27 (×2): 2 [IU] via SUBCUTANEOUS

## 2023-12-22 MED ORDER — SODIUM CHLORIDE 0.9% IV SOLUTION: Freq: Once | INTRAVENOUS | Status: AC

## 2023-12-22 MED ORDER — FUROSEMIDE 10 MG/ML IJ SOLN
80.0000 mg | Freq: Once | INTRAMUSCULAR | Status: AC
  Administered 2023-12-22: 80 mg via INTRAVENOUS
  Filled 2023-12-22: qty 8

## 2023-12-22 MED ORDER — TRAVASOL 10 % IV SOLN
INTRAVENOUS | Status: AC
  Filled 2023-12-22: qty 998.4

## 2023-12-22 MED ORDER — FUROSEMIDE 10 MG/ML IJ SOLN
60.0000 mg | Freq: Once | INTRAMUSCULAR | Status: AC
  Administered 2023-12-22: 60 mg via INTRAVENOUS
  Filled 2023-12-22: qty 6

## 2023-12-22 MED ORDER — POTASSIUM CHLORIDE 10 MEQ/50ML IV SOLN
10.0000 meq | INTRAVENOUS | Status: AC
  Administered 2023-12-22 (×2): 10 meq via INTRAVENOUS
  Filled 2023-12-22 (×2): qty 50

## 2023-12-22 NOTE — Progress Notes (Signed)
 eLink Physician-Brief Progress Note Patient Name: Nicholas Burke DOB: 1946-04-12 MRN: [id number]   Date of Service  12/22/2023  HPI/Events of Note  Hemoglobin 6.4 gm / dl, no evidence of active bleeding.  eICU Interventions  Two units of PRBC ordered transfused.        [name] [name] 12/22/2023, 12:22 AM

## 2023-12-22 NOTE — Progress Notes (Signed)
 NAME:  Nicholas Burke, MRN:  [id number], DOB:  1945/09/02, LOS: 16 ADMISSION DATE:  12/06/2023, CONSULTATION DATE:  12/06/2023 REFERRING MD: Dr. Celinda - TRH, CHIEF COMPLAINT: Intramural hematoma  History of Present Illness:  Nicholas Burke is a 78 year old male with a past medical history significant for prediabetes, hypertension, and prostate cancer who presented to the ED at [facility] 8/11 for complaints of epigastric pain.  On ED arrival patient was seen hypertensive with all other vital signs within normal limit.  CT abdomen pelvis obtained and revealed extensive intramural hematoma involving the descending thoracic aorta extending throughout the abdominal aorta and into the right external iliac artery.  CTA chest again confirms intramural hematoma beginning at the transverse aorta origin of the left subclavian artery and extending throughout the thoracic aorta and into the abdominal aorta.  Lab work significant for potassium 2.9.  Vascular surgery evaluated patient in ED and recommended tight blood pressure and heart rate control, PCCM consulted in this setting.   Pertinent  Medical History  prediabetes, hypertension, and prostate cancer  Significant Hospital Events: Including procedures, antibiotic start and stop dates in addition to other pertinent events   8/11 presented with epigastric abdominal pain found to have large intramural hematoma 8/14 remains on esmolol , no indication for urgent surgical intervention 8/15 ongoing issues with ileus 8/16 NGT placed to decompress 8/19 continued high output from NGT. Still on Cleviprex  and esmolol . Seen By GI in consult. Stopped CCB due to risk of constipation. Started TPN, PICC placed. 8/20 new tremor stopped the reglan . Still trying to get control of BP and HR. Started scheduled IV hydral + esmolol . 8/21 Patient became hypotensive, clonidine  patch was removed, hydralazine , esmolol  were stopped 8/22 on low-dose labetalol  infusion 8/23 had multiple small  bowel movements, denies abdominal pain, NG output was 550 cc 8/24 BMs  8/25 hgb drop 5.3, 2 PRBC. AKI  8/25 EGD - Normal larynx. - Tiny hiatal hernia. - Clotted blood in the gastric fundus and in the proximal gastric body. - Normal duodenal bulb, first portion of the duodenum, second portion of the duodenum 8/25 CTA chest/abd/pelvis>>1. No significant change in the previously demonstrated intramural hematoma extending from the origin of the left subclavian artery into the abdomen and right common and external iliac arteries. 2. Interval increase in aneurysmal dilatation of the proximal descending thoracic aorta, measuring 4.9 cm in diameter. 3. Interval small amount of patchy density in the posterior right upper lobe, compatible with pneumonia. 4. Interval small right pleural effusion and increased size of the previously demonstrated small left pleural effusion, currently small to moderate in size.  5. Interval dilatation of the stomach and mild dilatation of the proximal and mid small bowel loops with some low-density wall thickening in the pelvis. This is compatible with enteritis with partial small bowel obstruction or gastric and small-bowel ileus. 6. No evidence of active gastrointestinal bleeding. 7. Large right inguinal hernia containing fat and fluid, moderate-sized left inguinal hernia containing fat and small umbilical hernia containing fat. 8.  Calcific coronary artery and aortic atherosclerosis.  Interim History / Subjective:  Feels about the same-- abdomen feels a little more distended today. Overnight had darker BM, no BRB.  2 units pRBC ordered overnight.  Still off and on labetalol .   Objective    Blood pressure 125/82, pulse 84, temperature 98 F (36.7 C), temperature source Oral, resp. rate (!) 22, height 5' 10 (1.778 m), weight 102.6 kg, SpO2 96%.        Intake/Output Summary (  Last 24 hours) at 12/22/2023 0804 Last data filed at 12/22/2023 0645 Gross per 24 hour  Intake 3091.7 ml   Output 2450 ml  Net 641.7 ml   Filed Weights   12/20/23 0327 12/21/23 0413 12/22/23 0416  Weight: 90.2 kg 102 kg 102.6 kg    Physical exam General:  elderly man lying in bed in NAD HEENT: [place]/AT, eyes anicteric Neuro:  awake, alert, answering questions appropriately, moving all extremities CV: S1S2, RRR PULM:  breathing comfortably on , reduced basilar breath sounds, no rhales or rhonchi GI: distended, + bowel sounds today, NT Extremities: minimal pedal edema  WBC 10 H/H 7.3/22.4 Platelets 215   Resolved    Lactic acidosis on 8/17 Hypernatremia Hypokalemia HTN emergency  Assessment and Plan   Type B aortic dissection with intramural hematoma  HTN emergency -impulse control- goal SBP <120 & HR <60 -labetalol  gtt and hydralazine  PRN  -unable to take oral meds so far due to ileus  Acute blood loss anemia  Acute upper GI bleeding- blood in stomach but no obvious source  on EGD 8/25 -CBC q6h; got 2 units pRBC overnight and had recheck that was appropriate after the first unit -keeping NGT out unlness hematemesis per GI -PPI BID -SCD; unfortunately still holding DVT prophylaxis (problematic with how much time this has been held this admission)    Refractory Ileus Refeeding syndrome, on TPN -con't bowel regimen- neostigmine  -can retry erythromycin  if needed -walk as able -TPN -avoiding opiates  Acute hypoxic respiratory failure; oxygen requirements back down today. He most likely has been getting shunt from atelectasis when he has spent more time in bed. LLL ASD -- PNA v atelectasis C/f aspiration PNA v HCAP  -no plans to tap left effusion with the periaortic inflammation and relatively small size -pulmonary hygiene -diuresis   AKI ; resolved -strict I/O -tolerating diuresis -renally dose meds, avoid nephrotoxic meds  DM2 -SSI PRN -goal BG 140-180  Hx prostate cancer; in remission; had local radiation treatment -Outpatient follow-up with oncology as  previously scheduled  Family updated with patient at bedside during rounds.    Leita SHAUNNA Gaskins, DO 12/22/23 5:51 PM [place] [facility]  For contact information, see Amion. If no response to pager, please call PCCM consult pager. After hours, 7PM- 7AM, please call Elink.

## 2023-12-22 NOTE — Progress Notes (Signed)
 PHARMACY - TOTAL PARENTERAL NUTRITION CONSULT NOTE   Indication: Prolonged ileus  Patient Measurements: Height: 5' 10 (177.8 cm) Weight: 102.6 kg (226 lb 3.1 oz) IBW/kg (Calculated) : 73 TPN AdjBW (KG): 90.7 Body mass index is 32.46 kg/m. Usual Weight: 91.2 kg   Assessment:  Patient is a 78 yo M admitted with aortic dissection with intramural hematoma. Patient developed severe ileus secondary to dissection. Patient was active and had good nutrition PTA. Without nutrition for 7 days at the start of TPN. Patient at moderate risk of refeeding.   8/27 Patient is passing minimal gas. NG tube is removed and patient has not had significant N/V or distention per GI.   Glucose / Insulin : A1c 7.1, CBGs <180, SSI 10u in 24h  Electrolytes: Na 139; K 4.2 (goal 4-4.5 per primary), CoCa 9.2 (ca 2g IV given due to blood product transf), Phos 3.8, Mag 2.2, Cl 111, CO2 23 Renal: Scr normalizing 1.17, BUN 25  Hepatic: AST/ALT wnl, T bili 0.9, Alb 1.7 Intake / Output; MIVF: UOP 1 ml/kg/hr, net +19.8L since admission, fluids stopped, s/p lasix  80mg  x 1  GI Imaging:  8/11 CT abd- Extensive intramural hematoma involving the descending thoracic aorta extending throughout the abdominal aorta and into the right inflow vessels terminating in the mid right external iliac artery. 8/14 DG abd- There is increased amount of gas throughout the small bowel and colon, likely manifestation of adynamic ileus 8/17 DG abd- Persistent but improved gaseous gastric distension  8/19 DG abd- unchanged 8/24 DG abd: Diffuse air distension of the small bowel relatively similar to prior radiograph  GI Surgeries / Procedures:  8/25 EGD - minimal NG tube trauma, old clotted blood was found in the gastric fundus  Central access: PICC  TPN start date: 8/19  Nutritional Goals: Goal TPN rate is 80 mL/hr (provides 100 g of protein and 1994 kcals per day)  RD Assessment: Estimated Needs Total Energy Estimated Needs: 1900-2100  kcals Total Protein Estimated Needs: 95-105 g Total Fluid Estimated Needs: >/= 2L (not taking into account high NG output)  Current Nutrition:  Clear liquids  Plan: Continue TPN at 80 mL/hr which provides 100% of estimated daily caloric needs  Electrolytes in TPN: Na to 20 mEq/L, Incr K to 30 mEq/L (to account for lasix ), Ca 2 mEq/L, Mg 8 mEq/L, decrease Phos 12 mmol/L. Cl:Ac max acetate Add standard MVI and trace elements to TPN Decrease to Sensitive SSI q4h and adjust as needed  Increase Insulin  regular to 17 units in TPN Give KCL 10 meq IV x2 to account for lasix   Thiamine  x 5 days completed  Monitor TPN labs on Mon/Thurs Follow up starting feeds  Thank you for allowing pharmacy to be a part of this patient's care.  Vermell Mccallum, PharmD CCM Pharmacy Resident

## 2023-12-22 NOTE — Progress Notes (Signed)
 Nicholas Burke 5:40 PM  Subjective: The patient is doing fine without signs of bleeding and is tolerating not having his NG tube but currently he is just on ice chips and according to the nurse he had to brownish-black bowel movements no other new complaints  Objective: Vital signs stable afebrile no acute distress abdomen is soft nontender BUN is actually decreased from 37-25 and hemoglobin decreased from 7.1-6.4 probably hydration all increased to 8.0 with transfusion  Assessment: Multiple medical problems including recent upper GI bleed of questionable etiology  Plan: Consider repeat endoscopy just to be sure or if concerns over ongoing bleeding and will check on tomorrow but if no signs of bleeding clear liquids okay with me  [facility]  office [phone] After 5PM or if no answer call [phone]

## 2023-12-23 DIAGNOSIS — D62 Acute posthemorrhagic anemia: Secondary | ICD-10-CM | POA: Diagnosis not present

## 2023-12-23 DIAGNOSIS — I7103 Dissection of thoracoabdominal aorta: Secondary | ICD-10-CM | POA: Diagnosis not present

## 2023-12-23 DIAGNOSIS — K567 Ileus, unspecified: Secondary | ICD-10-CM | POA: Diagnosis not present

## 2023-12-23 DIAGNOSIS — K922 Gastrointestinal hemorrhage, unspecified: Secondary | ICD-10-CM | POA: Diagnosis not present

## 2023-12-23 LAB — COMPREHENSIVE METABOLIC PANEL WITH GFR
ALT: 32 U/L (ref 0–44)
AST: 37 U/L (ref 15–41)
Albumin: 1.7 g/dL — ABNORMAL LOW (ref 3.5–5.0)
Alkaline Phosphatase: 71 U/L (ref 38–126)
Anion gap: 7 (ref 5–15)
BUN: 22 mg/dL (ref 8–23)
CO2: 22 mmol/L (ref 22–32)
Calcium: 7.6 mg/dL — ABNORMAL LOW (ref 8.9–10.3)
Chloride: 113 mmol/L — ABNORMAL HIGH (ref 98–111)
Creatinine, Ser: 1.29 mg/dL — ABNORMAL HIGH (ref 0.61–1.24)
GFR, Estimated: 57 mL/min — ABNORMAL LOW (ref >60)
Glucose, Bld: 185 mg/dL — ABNORMAL HIGH (ref 70–99)
Potassium: 3.9 mmol/L (ref 3.5–5.1)
Sodium: 142 mmol/L (ref 135–145)
Total Bilirubin: 0.7 mg/dL (ref 0.0–1.2)
Total Protein: 5.1 g/dL — ABNORMAL LOW (ref 6.5–8.1)

## 2023-12-23 LAB — TYPE AND SCREEN
ABO/RH(D): A POS
Antibody Screen: NEGATIVE
Unit division: 0
Unit division: 0

## 2023-12-23 LAB — CBC
HCT: 22.9 % — ABNORMAL LOW (ref 39.0–52.0)
HCT: 24.5 % — ABNORMAL LOW (ref 39.0–52.0)
Hemoglobin: 7.6 g/dL — ABNORMAL LOW (ref 13.0–17.0)
Hemoglobin: 7.7 g/dL — ABNORMAL LOW (ref 13.0–17.0)
MCH: 31.8 pg (ref 26.0–34.0)
MCH: 30.8 pg (ref 26.0–34.0)
MCHC: 33.2 g/dL (ref 30.0–36.0)
MCHC: 31.4 g/dL (ref 30.0–36.0)
MCV: 95.8 fL (ref 80.0–100.0)
MCV: 98 fL (ref 80.0–100.0)
Platelets: 219 K/uL (ref 150–400)
Platelets: 226 K/uL (ref 150–400)
RBC: 2.39 MIL/uL — ABNORMAL LOW (ref 4.22–5.81)
RBC: 2.5 MIL/uL — ABNORMAL LOW (ref 4.22–5.81)
RDW: 18 % — ABNORMAL HIGH (ref 11.5–15.5)
RDW: 18 % — ABNORMAL HIGH (ref 11.5–15.5)
WBC: 8 K/uL (ref 4.0–10.5)
WBC: 7.9 K/uL (ref 4.0–10.5)
nRBC: 0.4 % — ABNORMAL HIGH (ref 0.0–0.2)
nRBC: 0.3 % — ABNORMAL HIGH (ref 0.0–0.2)

## 2023-12-23 LAB — GLUCOSE, CAPILLARY
Glucose-Capillary: 118 mg/dL — ABNORMAL HIGH (ref 70–99)
Glucose-Capillary: 121 mg/dL — ABNORMAL HIGH (ref 70–99)
Glucose-Capillary: 121 mg/dL — ABNORMAL HIGH (ref 70–99)
Glucose-Capillary: 122 mg/dL — ABNORMAL HIGH (ref 70–99)
Glucose-Capillary: 132 mg/dL — ABNORMAL HIGH (ref 70–99)
Glucose-Capillary: 141 mg/dL — ABNORMAL HIGH (ref 70–99)

## 2023-12-23 LAB — BPAM RBC
Blood Product Expiration Date: 202509012359
Blood Product Expiration Date: 202509012359
ISSUE DATE / TIME: 202508270211
ISSUE DATE / TIME: 202508270211
Unit Type and Rh: 9500
Unit Type and Rh: 9500

## 2023-12-23 LAB — PHOSPHORUS: Phosphorus: 3.4 mg/dL (ref 2.5–4.6)

## 2023-12-23 LAB — MAGNESIUM: Magnesium: 1.9 mg/dL (ref 1.7–2.4)

## 2023-12-23 MED ORDER — MAGNESIUM SULFATE 2 GM/50ML IV SOLN
2.0000 g | Freq: Once | INTRAVENOUS | Status: AC
  Administered 2023-12-23: 2 g via INTRAVENOUS
  Filled 2023-12-23: qty 50

## 2023-12-23 MED ORDER — TRAVASOL 10 % IV SOLN
INTRAVENOUS | Status: AC
  Filled 2023-12-23: qty 998.4

## 2023-12-23 MED ORDER — SALINE SPRAY 0.65 % NA SOLN
1.0000 | NASAL | Status: DC | PRN
  Administered 2023-12-24: 1 via NASAL
  Filled 2023-12-23 (×2): qty 44

## 2023-12-23 NOTE — Progress Notes (Signed)
 PHARMACY - TOTAL PARENTERAL NUTRITION CONSULT NOTE   Indication: Prolonged ileus  Patient Measurements: Height: 5' 10 (177.8 cm) Weight: 102.3 kg (225 lb 8.5 oz) IBW/kg (Calculated) : 73 TPN AdjBW (KG): 90.7 Body mass index is 32.36 kg/m. Usual Weight: 91.2 kg   Assessment:  Patient is a 78 yo M admitted with aortic dissection with intramural hematoma. Patient developed severe ileus secondary to dissection. Patient was active and had good nutrition PTA. Without nutrition for 7 days at the start of TPN. Patient at moderate risk of refeeding.   8/27 Patient is passing minimal gas. NG tube is removed and patient has not had significant N/V or distention per GI.   Glucose / Insulin : A1c 7.1, CBGs <180, SSI 7u in 24h  Electrolytes: Na 142; K 3.9 (20 meq yesterday, goal 4-4.5 per primary), CoCa 9.4 (ca 1 g IV given due to blood product transf), Phos 3.4, increase Mag 1.9, Cl 113, CO2 22 Renal: Scr normalizing 1.29, BUN 22  Hepatic: AST/ALT wnl, T bili 0.9, Alb 1.7 Intake / Output; MIVF: UOP 1.2 ml/kg/hr, net +19.6L since admission, Lasix  80mg  x 1 yesterday  GI Imaging:  8/11 CT abd- Extensive intramural hematoma involving the descending thoracic aorta extending throughout the abdominal aorta and into the right inflow vessels terminating in the mid right external iliac artery. 8/14 DG abd- There is increased amount of gas throughout the small bowel and colon, likely manifestation of adynamic ileus 8/17 DG abd- Persistent but improved gaseous gastric distension  8/19 DG abd- unchanged 8/24 DG abd: Diffuse air distension of the small bowel relatively similar to prior radiograph  GI Surgeries / Procedures:  8/25 EGD - minimal NG tube trauma, old clotted blood was found in the gastric fundus 8/28 plan for repeat EGD  Central access: PICC  TPN start date: 8/19  Nutritional Goals: Goal TPN rate is 80 mL/hr (provides 100 g of protein and 1994 kcals per day)  RD Assessment: Estimated  Needs Total Energy Estimated Needs: 1900-2100 kcals Total Protein Estimated Needs: 95-105 g Total Fluid Estimated Needs: >/= 2L (not taking into account high NG output)  Current Nutrition:  NPO for procedure   Plan: Continue TPN at 80 mL/hr which provides 100% of estimated daily caloric needs  Electrolytes in TPN:  No changes- Na to 20 mEq/L, K 30 mEq/L, Ca 2 mEq/L, Mg 8 mEq/L, Phos 12 mmol/L. Cl:Ac max acetate Add standard MVI and trace elements to TPN Sensitive SSI q4h and adjust as needed  Add Insulin  regular to 17 units in TPN Thiamine  x 5 days completed  Monitor TPN labs on Mon/Thurs Follow up starting feeds  Thank you for allowing pharmacy to be a part of this patient's care.  Vermell Mccallum, PharmD CCM Pharmacy Resident

## 2023-12-23 NOTE — Progress Notes (Signed)
 NAME:  Nicholas Burke, MRN:  [id number], DOB:  [birth year], LOS: 17 ADMISSION DATE:  12/06/2023, CONSULTATION DATE:  12/06/2023 REFERRING MD: Dr. Celinda - TRH, CHIEF COMPLAINT: Intramural hematoma  History of Present Illness:  Nicholas Burke is a 78 year old male with a past medical history significant for prediabetes, hypertension, and prostate cancer who presented to the ED at [facility] 8/11 for complaints of epigastric pain.  On ED arrival patient was seen hypertensive with all other vital signs within normal limit.  CT abdomen pelvis obtained and revealed extensive intramural hematoma involving the descending thoracic aorta extending throughout the abdominal aorta and into the right external iliac artery.  CTA chest again confirms intramural hematoma beginning at the transverse aorta origin of the left subclavian artery and extending throughout the thoracic aorta and into the abdominal aorta.  Lab work significant for potassium 2.9.  Vascular surgery evaluated patient in ED and recommended tight blood pressure and heart rate control, PCCM consulted in this setting.   Pertinent  Medical History  prediabetes, hypertension, and prostate cancer  Significant Hospital Events: Including procedures, antibiotic start and stop dates in addition to other pertinent events   8/11 presented with epigastric abdominal pain found to have large intramural hematoma 8/14 remains on esmolol , no indication for urgent surgical intervention 8/15 ongoing issues with ileus 8/16 NGT placed to decompress 8/19 continued high output from NGT. Still on Cleviprex  and esmolol . Seen By GI in consult. Stopped CCB due to risk of constipation. Started TPN, PICC placed. 8/20 new tremor stopped the reglan . Still trying to get control of BP and HR. Started scheduled IV hydral + esmolol . 8/21 Patient became hypotensive, clonidine  patch was removed, hydralazine , esmolol  were stopped 8/22 on low-dose labetalol  infusion 8/23 had multiple small  bowel movements, denies abdominal pain, NG output was 550 cc 8/24 BMs  8/25 hgb drop 5.3, 2 PRBC. AKI  8/25 EGD - Normal larynx. - Tiny hiatal hernia. - Clotted blood in the gastric fundus and in the proximal gastric body. - Normal duodenal bulb, first portion of the duodenum, second portion of the duodenum 8/25 CTA chest/abd/pelvis>>1. No significant change in the previously demonstrated intramural hematoma extending from the origin of the left subclavian artery into the abdomen and right common and external iliac arteries. 2. Interval increase in aneurysmal dilatation of the proximal descending thoracic aorta, measuring 4.9 cm in diameter. 3. Interval small amount of patchy density in the posterior right upper lobe, compatible with pneumonia. 4. Interval small right pleural effusion and increased size of the previously demonstrated small left pleural effusion, currently small to moderate in size.  5. Interval dilatation of the stomach and mild dilatation of the proximal and mid small bowel loops with some low-density wall thickening in the pelvis. This is compatible with enteritis with partial small bowel obstruction or gastric and small-bowel ileus. 6. No evidence of active gastrointestinal bleeding. 7. Large right inguinal hernia containing fat and fluid, moderate-sized left inguinal hernia containing fat and small umbilical hernia containing fat. 8.  Calcific coronary artery and aortic atherosclerosis.  Interim History / Subjective:  Feels much better this morning. Looks great OOB in chair. Wants ice chips. Abd feels slightly less tight. Walked this morning. Remains on and off labetalol  gtt - currently on.   Last several stools loose and brown, non bloody   Objective    Blood pressure 109/71, pulse 77, temperature 97.7 F (36.5 C), temperature source Oral, resp. rate 18, height 5' 10 (1.778 m), weight 102.3 kg, SpO2 ROLLEN)  87%.        Intake/Output Summary (Last 24 hours) at 12/23/2023  0839 Last data filed at 12/23/2023 0800 Gross per 24 hour  Intake 2874.27 ml  Output 3000 ml  Net -125.73 ml   Filed Weights   12/21/23 0413 12/22/23 0416 12/23/23 0300  Weight: 102 kg 102.6 kg 102.3 kg    Physical exam General:  chronically ill appearing male, NAD in chair  HEENT: [place]/AT, eyes anicteric Neuro:  awake, alert, appropriate, OOB in chair  CV: S1S2, RRR PULM: resps even non labored on RA, diminished bases otherwise clear   GI: distended but less so, some audible hypoactive bs, NT Extremities: minimal pedal edema  WBC 10 H/H 7.3/22.4 Platelets 215  Resolved    Lactic acidosis on 8/17 Hypernatremia Hypokalemia HTN emergency  Assessment and Plan   Type B aortic dissection with intramural hematoma  HTN emergency -impulse control- goal SBP <120 & HR <60 -labetalol  gtt and hydralazine  PRN  -goal oral meds once ileus improves   Acute blood loss anemia - improving  Acute upper GI bleeding- blood in stomach but no obvious source  on EGD 8/25 -last transfusion 2 units 8/27 am for hgb 6.4 -keeping NGT out unlness hematemesis per GI -PPI BID -SCD; unfortunately still holding DVT prophylaxis (problematic with how much time this has been held this admission)  -GI following - considering f/u EGD given ongoing bleeding concerns    Refractory Ileus Refeeding syndrome, on TPN -con't bowel regimen- neostigmine  -can retry erythromycin  if needed -walk and mobilize as able -TPN -avoiding opiates  Acute hypoxic respiratory failure; improving.  LLL ASD -- PNA v atelectasis C/f aspiration PNA v HCAP  -no plans to tap left effusion with the periaortic inflammation and relatively small size -pulmonary hygiene -diuresis PRN    AKI ; resolved -strict I/O - although suspect NET I/O fairly inaccurate given large amount liquid stool loss  -tolerating diuresis -renally dose meds, avoid nephrotoxic meds  DM2 -SSI PRN -goal BG 140-180  Hx prostate cancer; in remission;  had local radiation treatment -Outpatient follow-up with oncology as previously scheduled  Family updated with patient at bedside 8/28.   Rockie Myers, NP Pulmonary/Critical Care Medicine  12/23/2023  8:39 AM   See Amion for personal pager PCCM on call pager [phone] until 7pm. Please call Elink 7p-7a. [phone]

## 2023-12-23 NOTE — Progress Notes (Addendum)
 Physical Therapy Treatment Patient Details Name: Nicholas Burke MRN: [id number] DOB: 06/22/1945 Today's Date: 12/23/2023   History of Present Illness Pt is 78 year old presented to [facility] on  12/06/23 for Type B aortic dissection with intramural hematoma. Developed severe ileus due to dissection. Developed acute respiratory failure with hypoxia suspected due to aspiration from nonfunctioning NGT. Developed GIB. PMH - htn, prostate CA    PT Comments  Excellent progress today. Transfer training completed from various surfaces at a supervision level. Light AD use from RW for support, ambulating around CVICU unit at a CGA-SUP level without overt instability this date. Reviewed LE exercises. Encouraged OOB multiple times per day with staff. SpO2 maintained 91% on RA while ambulating. Minimal fatigue with activity. May benefit from OPPT if still a bit deconditioned prior to d/c. Patient will continue to benefit from skilled physical therapy services to further improve independence with functional mobility.     If plan is discharge home, recommend the following: Assist for transportation;Help with stairs or ramp for entrance;Assistance with cooking/housework;A little help with walking and/or transfers;A little help with bathing/dressing/bathroom;Supervision due to cognitive status   Can travel by private vehicle        Equipment Recommendations  Other (comment) (Possibly Rollator - pending progress)    Recommendations for Other Services       Precautions / Restrictions Precautions Precautions: Other (comment) Recall of Precautions/Restrictions: Intact Precaution/Restrictions Comments: multiple lines/tubes. GIB Restrictions Weight Bearing Restrictions Per Provider Order: No     Mobility  Bed Mobility               General bed mobility comments: in recliner    Transfers Overall transfer level: Needs assistance Equipment used: Rolling walker (2 wheels) Transfers: Sit to/from Stand, Bed  to chair/wheelchair/BSC Sit to Stand: Supervision           General transfer comment: Supervision for safety with cues for hand placement. Practiced from recliner and toilet.    Ambulation/Gait Ambulation/Gait assistance: Contact guard assist, +2 safety/equipment Gait Distance (Feet): 500 Feet Assistive device: Rolling walker (2 wheels) Gait Pattern/deviations: Step-through pattern, Decreased stride length, Drifts right/left Gait velocity: decr Gait velocity interpretation: <1.8 ft/sec, indicate of risk for recurrent falls   General Gait Details: Minor sway initially but improved with distance. Cues for upright posture within RW to maximize safety and support. No buckling noted. SpO2 91% on RA when good waveform obtained.. Mildly fatigued.   Stairs             Wheelchair Mobility     Tilt Bed    Modified Rankin (Stroke Patients Only)       Balance Overall balance assessment: Mild deficits observed, not formally tested                                          Communication Communication Communication: No apparent difficulties  Cognition Arousal: Alert Behavior During Therapy: Flat affect   PT - Cognitive impairments: No apparent impairments                         Following commands: Intact      Cueing Cueing Techniques: Verbal cues  Exercises General Exercises - Lower Extremity Ankle Circles/Pumps: AROM, Both, 15 reps, Seated Long Arc Quad: Strengthening, Both, 10 reps, Seated    General Comments General comments (skin integrity, edema, etc.): VSS throughout  on RA.      Pertinent Vitals/Pain Pain Assessment Pain Assessment: No/denies pain    Home Living                          Prior Function            PT Goals (current goals can now be found in the care plan section) Acute Rehab PT Goals Patient Stated Goal: return home PT Goal Formulation: With patient Time For Goal Achievement:  12/29/23 Potential to Achieve Goals: Good Progress towards PT goals: Progressing toward goals    Frequency    Min 2X/week      PT Plan      Co-evaluation              AM-PAC PT 6 Clicks Mobility   Outcome Measure  Help needed turning from your back to your side while in a flat bed without using bedrails?: None Help needed moving from lying on your back to sitting on the side of a flat bed without using bedrails?: A Little Help needed moving to and from a bed to a chair (including a wheelchair)?: A Little Help needed standing up from a chair using your arms (e.g., wheelchair or bedside chair)?: A Little Help needed to walk in hospital room?: A Little Help needed climbing 3-5 steps with a railing? : A Little 6 Click Score: 19    End of Session   Activity Tolerance: Patient tolerated treatment well Patient left: with call bell/phone within reach;with family/visitor present;in chair;with chair alarm set (Declined SCDs) Nurse Communication: Mobility status PT Visit Diagnosis: Other abnormalities of gait and mobility (R26.89);Muscle weakness (generalized) (M62.81);Difficulty in walking, not elsewhere classified (R26.2)     Time: 8559-8495 PT Time Calculation (min) (ACUTE ONLY): 24 min  Charges:    $Gait Training: 8-22 mins $Therapeutic Activity: 8-22 mins PT General Charges $$ ACUTE PT VISIT: 1 Visit                    Addended for visit time 8/28 - now correct in chart   Leontine Roads, PT, DPT [facility]  Rehabilitation Services Physical Therapist Office: [phone] Website: [email or web address]    Leontine GORMAN Roads 12/23/2023, 4:05 PM

## 2023-12-23 NOTE — Progress Notes (Signed)
 Nicholas Burke 5:48 PM  Subjective: Patient seen and examined and case discussed with his nurse and he has no GI complaints and he had 2 brown bowel movements no abdominal pain  Objective: Vital signs stable afebrile no acute distress abdomen is soft nontender BUN continues to decrease creatinine slight bump from previous hemoglobin is stable  Assessment: Resolved ileus and GI bleeding  Plan: Will allow clear liquids and will leave to Dr. Saintclair this weekend to decide if repeat endoscopy is warranted but certainly if signs of increased bleeding please call sooner and I am okay with oral medicines as well  [facility] E  office [phone] After 5PM or if no answer call [phone]

## 2023-12-24 DIAGNOSIS — J90 Pleural effusion, not elsewhere classified: Secondary | ICD-10-CM

## 2023-12-24 DIAGNOSIS — D62 Acute posthemorrhagic anemia: Secondary | ICD-10-CM | POA: Diagnosis not present

## 2023-12-24 DIAGNOSIS — I7100 Dissection of unspecified site of aorta: Secondary | ICD-10-CM | POA: Diagnosis not present

## 2023-12-24 DIAGNOSIS — J690 Pneumonitis due to inhalation of food and vomit: Secondary | ICD-10-CM

## 2023-12-24 DIAGNOSIS — K567 Ileus, unspecified: Secondary | ICD-10-CM | POA: Diagnosis not present

## 2023-12-24 DIAGNOSIS — K922 Gastrointestinal hemorrhage, unspecified: Secondary | ICD-10-CM | POA: Diagnosis not present

## 2023-12-24 LAB — BPAM RBC
Blood Product Expiration Date: 202509012359
Blood Product Expiration Date: 202509232359
Blood Product Expiration Date: 202509232359
Blood Product Expiration Date: 202509242359
ISSUE DATE / TIME: 202508250648
ISSUE DATE / TIME: 202508250648
ISSUE DATE / TIME: 202508251438
Unit Type and Rh: 6200
Unit Type and Rh: 6200
Unit Type and Rh: 6200
Unit Type and Rh: 6200

## 2023-12-24 LAB — TYPE AND SCREEN
ABO/RH(D): A POS
Antibody Screen: NEGATIVE
Unit division: 0
Unit division: 0
Unit division: 0
Unit division: 0

## 2023-12-24 LAB — BASIC METABOLIC PANEL WITH GFR
Anion gap: 10 (ref 5–15)
BUN: 21 mg/dL (ref 8–23)
CO2: 22 mmol/L (ref 22–32)
Calcium: 7.8 mg/dL — ABNORMAL LOW (ref 8.9–10.3)
Chloride: 109 mmol/L (ref 98–111)
Creatinine, Ser: 1.46 mg/dL — ABNORMAL HIGH (ref 0.61–1.24)
GFR, Estimated: 50 mL/min — ABNORMAL LOW (ref >60)
Glucose, Bld: 141 mg/dL — ABNORMAL HIGH (ref 70–99)
Potassium: 4 mmol/L (ref 3.5–5.1)
Sodium: 141 mmol/L (ref 135–145)

## 2023-12-24 LAB — GLUCOSE, CAPILLARY
Glucose-Capillary: 115 mg/dL — ABNORMAL HIGH (ref 70–99)
Glucose-Capillary: 133 mg/dL — ABNORMAL HIGH (ref 70–99)
Glucose-Capillary: 139 mg/dL — ABNORMAL HIGH (ref 70–99)
Glucose-Capillary: 144 mg/dL — ABNORMAL HIGH (ref 70–99)
Glucose-Capillary: 146 mg/dL — ABNORMAL HIGH (ref 70–99)
Glucose-Capillary: 152 mg/dL — ABNORMAL HIGH (ref 70–99)
Glucose-Capillary: 86 mg/dL (ref 70–99)

## 2023-12-24 LAB — CBC
HCT: 23.3 % — ABNORMAL LOW (ref 39.0–52.0)
HCT: 23 % — ABNORMAL LOW (ref 39.0–52.0)
Hemoglobin: 7.4 g/dL — ABNORMAL LOW (ref 13.0–17.0)
Hemoglobin: 7.5 g/dL — ABNORMAL LOW (ref 13.0–17.0)
MCH: 30.8 pg (ref 26.0–34.0)
MCH: 30.6 pg (ref 26.0–34.0)
MCHC: 31.8 g/dL (ref 30.0–36.0)
MCHC: 32.6 g/dL (ref 30.0–36.0)
MCV: 97.1 fL (ref 80.0–100.0)
MCV: 93.9 fL (ref 80.0–100.0)
Platelets: 244 K/uL (ref 150–400)
Platelets: 262 K/uL (ref 150–400)
RBC: 2.4 MIL/uL — ABNORMAL LOW (ref 4.22–5.81)
RBC: 2.45 MIL/uL — ABNORMAL LOW (ref 4.22–5.81)
RDW: 18.1 % — ABNORMAL HIGH (ref 11.5–15.5)
RDW: 17.5 % — ABNORMAL HIGH (ref 11.5–15.5)
WBC: 7.3 K/uL (ref 4.0–10.5)
WBC: 7.4 K/uL (ref 4.0–10.5)
nRBC: 0.3 % — ABNORMAL HIGH (ref 0.0–0.2)
nRBC: 0 % (ref 0.0–0.2)

## 2023-12-24 MED ORDER — LABETALOL HCL 5 MG/ML IV SOLN: 5.0000 mg | INTRAVENOUS | Status: DC | PRN

## 2023-12-24 MED ORDER — TRAVASOL 10 % IV SOLN
INTRAVENOUS | Status: AC
  Filled 2023-12-24: qty 998.4

## 2023-12-24 MED ORDER — CARVEDILOL 6.25 MG PO TABS
6.2500 mg | ORAL_TABLET | Freq: Two times a day (BID) | ORAL | Status: DC
  Administered 2023-12-24 (×2): 6.25 mg via ORAL
  Filled 2023-12-24 (×2): qty 1

## 2023-12-24 MED ORDER — POLYETHYLENE GLYCOL 3350 17 G PO PACK
17.0000 g | PACK | Freq: Every day | ORAL | Status: DC
  Administered 2023-12-24 – 2023-12-28 (×4): 17 g via ORAL
  Filled 2023-12-24 (×5): qty 1

## 2023-12-24 MED ORDER — SODIUM CHLORIDE 0.9 % IV SOLN: INTRAVENOUS | Status: AC

## 2023-12-24 MED ORDER — GERHARDT'S BUTT CREAM
TOPICAL_CREAM | Freq: Two times a day (BID) | CUTANEOUS | Status: DC
  Administered 2023-12-24 – 2023-12-29 (×3): 1 via TOPICAL
  Filled 2023-12-24 (×2): qty 60

## 2023-12-24 NOTE — Progress Notes (Signed)
 NAME:  Nicholas Burke, MRN:  [id number], DOB:  09/11/1945, LOS: 18 ADMISSION DATE:  12/06/2023, CONSULTATION DATE:  12/06/2023 REFERRING MD: Dr. Celinda - TRH, CHIEF COMPLAINT: Intramural hematoma  History of Present Illness:  Nicholas Burke is a 78 year old male with a past medical history significant for prediabetes, hypertension, and prostate cancer who presented to the ED at [facility] 8/11 for complaints of epigastric pain.  On ED arrival patient was seen hypertensive with all other vital signs within normal limit.  CT abdomen pelvis obtained and revealed extensive intramural hematoma involving the descending thoracic aorta extending throughout the abdominal aorta and into the right external iliac artery.  CTA chest again confirms intramural hematoma beginning at the transverse aorta origin of the left subclavian artery and extending throughout the thoracic aorta and into the abdominal aorta.  Lab work significant for potassium 2.9.  Vascular surgery evaluated patient in ED and recommended tight blood pressure and heart rate control, PCCM consulted in this setting.   Pertinent  Medical History  prediabetes, hypertension, and prostate cancer  Significant Hospital Events: Including procedures, antibiotic start and stop dates in addition to other pertinent events   8/11 presented with epigastric abdominal pain found to have large intramural hematoma 8/14 remains on esmolol , no indication for urgent surgical intervention 8/15 ongoing issues with ileus 8/16 NGT placed to decompress 8/19 continued high output from NGT. Still on Cleviprex  and esmolol . Seen By GI in consult. Stopped CCB due to risk of constipation. Started TPN, PICC placed. 8/20 new tremor stopped the reglan . Still trying to get control of BP and HR. Started scheduled IV hydral + esmolol . 8/21 Patient became hypotensive, clonidine  patch was removed, hydralazine , esmolol  were stopped 8/22 on low-dose labetalol  infusion 8/23 had multiple small  bowel movements, denies abdominal pain, NG output was 550 cc 8/24 BMs  8/25 hgb drop 5.3, 2 PRBC. AKI  8/25 EGD - Normal larynx. - Tiny hiatal hernia. - Clotted blood in the gastric fundus and in the proximal gastric body. - Normal duodenal bulb, first portion of the duodenum, second portion of the duodenum 8/25 CTA chest/abd/pelvis>>1. No significant change in the previously demonstrated intramural hematoma extending from the origin of the left subclavian artery into the abdomen and right common and external iliac arteries. 2. Interval increase in aneurysmal dilatation of the proximal descending thoracic aorta, measuring 4.9 cm in diameter. 3. Interval small amount of patchy density in the posterior right upper lobe, compatible with pneumonia. 4. Interval small right pleural effusion and increased size of the previously demonstrated small left pleural effusion, currently small to moderate in size.  5. Interval dilatation of the stomach and mild dilatation of the proximal and mid small bowel loops with some low-density wall thickening in the pelvis. This is compatible with enteritis with partial small bowel obstruction or gastric and small-bowel ileus. 6. No evidence of active gastrointestinal bleeding. 7. Large right inguinal hernia containing fat and fluid, moderate-sized left inguinal hernia containing fat and small umbilical hernia containing fat. 8.  Calcific coronary artery and aortic atherosclerosis. 8/29 adding PO antiHTN   Interim History / Subjective:  Ongoing brown BM, now formed No nausea Hgb stable   Objective    Blood pressure 125/79, pulse 78, temperature (!) 97.4 F (36.3 C), temperature source Oral, resp. rate (!) 21, height 5' 10 (1.778 m), weight 101.1 kg, SpO2 94%.        Intake/Output Summary (Last 24 hours) at 12/24/2023 0912 Last data filed at 12/24/2023 0815 Gross per  24 hour  Intake 2699.49 ml  Output 900 ml  Net 1799.49 ml   Filed Weights   12/22/23 0416 12/23/23  0300 12/24/23 0545  Weight: 102.6 kg 102.3 kg 101.1 kg    Physical exam General:  Ill appearing M  HEENT: NCAt pink mm  Neuro:  Lethargic, AAOx4  CV: rrr PULM: evn unlabored  GI: round soft  Extremities: no acute joint deformity   Resolved    Lactic acidosis on 8/17 Hypernatremia Hypokalemia HTN emergency  Assessment and Plan    Type B dissection, intramural hematoma HTN  P -on labetalol  gtt still with goal SBP < 120 and HR ideally < 60 but have achieved 70s consistently  -GI has okayed PO meds   UGIB ABLA -improving -last transfused 8/27 P -follow CBC transfuse as needed -BID PPI -GI to follow this weekend, possible follow up EGD  -consider chem vte ppx if hgb remains stable   Ileus, improving Inadequate PO intake P -Cont TPN -GI has cleared to allow clears and oral meds  -will change suppository to PO bowel reg   Acute hypoxic resp failure LLL ASD -- c/f aspiration pneumonia Atelectasis L pleural effusion (small)  P -unasyn  -pulm hygiene -no plans to tap effusion, has received some diuresis but holding 8/29 as is on RA   AKI  P -follow renal indices UOP  -defer diuresis 8/29 AM   DM2 -SSI   Hx prostate cancer, in remission  -Outpatient follow-up with oncology as previously scheduled   CRITICAL CARE Performed by: Ronnald FORBES Gave   Total critical care time: 38 minutes  Critical care time was exclusive of separately billable procedures and treating other patients. Critical care was necessary to treat or prevent imminent or life-threatening deterioration.  Critical care was time spent personally by me on the following activities: development of treatment plan with patient and/or surrogate as well as nursing, discussions with consultants, evaluation of patient's response to treatment, examination of patient, obtaining history from patient or surrogate, ordering and performing treatments and interventions, ordering and review of laboratory  studies, ordering and review of radiographic studies, pulse oximetry and re-evaluation of patient's condition.  Ronnald Gave MSN, AGACNP-BC [place] Pulmonary/Critical Care Medicine Amion for pager  12/24/2023, 9:12 AM

## 2023-12-24 NOTE — Progress Notes (Signed)
 PHARMACY - TOTAL PARENTERAL NUTRITION CONSULT NOTE   Indication: Prolonged ileus  Patient Measurements: Height: 5' 10 (177.8 cm) Weight: 101.1 kg (222 lb 14.2 oz) IBW/kg (Calculated) : 73 TPN AdjBW (KG): 90.7 Body mass index is 31.98 kg/m. Usual Weight: 91.2 kg   Assessment:  Patient is a 78 yo M admitted with aortic dissection with intramural hematoma. Patient developed severe ileus secondary to dissection. Patient was active and had good nutrition PTA. Without nutrition for 7 days at the start of TPN. Patient at moderate risk of refeeding.   8/27 Patient is passing minimal gas. NG tube is removed and patient has not had significant N/V or distention per GI.   Glucose / Insulin : A1c 7.1, CBGs 120-140s with 17 units inTPN +  SSI (6u in 24h) Electrolytes: Na 141; K 4 (goal 4-4.5 per primary), CoCa 9.6, Cl 109, CO2 22 Renal: Scr bumped at 1.46 (last lasix  8/27), BUN 21 Hepatic: AST/ALT wnl, T bili 0.7, Alb 1.7 Intake / Output; MIVF: UOP not accurately measures (x4 unmeasured), Stool x3 yesterday (liquid)  GI Imaging:  8/11 CT abd- Extensive intramural hematoma involving the descending thoracic aorta extending throughout the abdominal aorta and into the right inflow vessels terminating in the mid right external iliac artery. 8/14 DG abd- There is increased amount of gas throughout the small bowel and colon, likely manifestation of adynamic ileus 8/17 DG abd- Persistent but improved gaseous gastric distension  8/19 DG abd- unchanged 8/24 DG abd: Diffuse air distension of the small bowel relatively similar to prior radiograph  GI Surgeries / Procedures:  8/25 EGD - minimal NG tube trauma, old clotted blood was found in the gastric fundus 8/28 plan for repeat EGD  Central access: PICC  TPN start date: 8/19  Nutritional Goals: Goal TPN rate is 80 mL/hr (provides 100 g of protein and 1994 kcals per day)  RD Assessment: Estimated Needs Total Energy Estimated Needs: 1900-2100  kcals Total Protein Estimated Needs: 95-105 g Total Fluid Estimated Needs: >/= 2L (not taking into account high NG output)  Current Nutrition:  Clear liquid diet TPN  Plan: Continue TPN at 80 mL/hr which provides 100% of estimated daily caloric needs  Electrolytes in TPN:  No changes- Na to 20 mEq/L, K 30 mEq/L, Ca 2 mEq/L, Mg 8 mEq/L, Phos 12 mmol/L.  Cl:Ac adjusted to 1:2 Add standard MVI and trace elements to TPN Sensitive SSI q4h and adjust as needed  Reduce Insulin  regular to 14 units in TPN Thiamine  x 5 days completed  Monitor TPN labs on Mon/Thurs Monitor diet advancement as tolerated  Thank you for allowing pharmacy to be a part of this patient's care.  Harlene Boga, PharmD, BCPS, BCCCP Clinical Pharmacist Please refer to [facility] for [facility] numbers 12/24/2023, 10:50 AM

## 2023-12-24 NOTE — Progress Notes (Signed)
 Physical Therapy Treatment Patient Details Name: Nicholas Burke MRN: [id number] DOB: 1946/03/12 Today's Date: 12/24/2023   History of Present Illness Pt is 78 year old presented to [facility] on  12/06/23 for Type B aortic dissection with intramural hematoma. Developed severe ileus due to dissection. Developed acute respiratory failure with hypoxia suspected due to aspiration from nonfunctioning NGT. Developed GIB. PMH - htn, prostate CA    PT Comments  Functionally doing quite well with good restoration of strength from early visits. Pt able to transfer safely with RW at a supervision level, some minor instability present. Progression with gait training limited by frequent liquid bowel incontinence today. Tolerated standing >10 min for assist with peri-care and hygiene. VSS throughout on room air today. Family bringing briefs to contains stools and enable more efforts with higher level mobility. Patient will continue to benefit from skilled physical therapy services to further improve independence with functional mobility.     If plan is discharge home, recommend the following: Assist for transportation;Help with stairs or ramp for entrance;Assistance with cooking/housework;A little help with walking and/or transfers;A little help with bathing/dressing/bathroom;Supervision due to cognitive status   Can travel by private vehicle        Equipment Recommendations  Other (comment) (Possibly Rollator - pending progress)    Recommendations for Other Services       Precautions / Restrictions Precautions Precautions: Other (comment) Recall of Precautions/Restrictions: Intact Precaution/Restrictions Comments: multiple lines/tubes. GIB Restrictions Weight Bearing Restrictions Per Provider Order: No     Mobility  Bed Mobility               General bed mobility comments: in recliner    Transfers Overall transfer level: Needs assistance Equipment used: Rolling walker (2 wheels) Transfers: Sit  to/from Stand, Bed to chair/wheelchair/BSC Sit to Stand: Supervision           General transfer comment: Supervision for safety with cues for hand placement. Practiced from recliner. Fair control with descent.    Ambulation/Gait             Pre-gait activities: Stationary march.     Stairs             Wheelchair Mobility     Tilt Bed    Modified Rankin (Stroke Patients Only)       Balance Overall balance assessment: Mild deficits observed, not formally tested                                          Communication Communication Communication: No apparent difficulties  Cognition Arousal: Alert Behavior During Therapy: Flat affect   PT - Cognitive impairments: No apparent impairments                         Following commands: Intact      Cueing Cueing Techniques: Verbal cues  Exercises General Exercises - Lower Extremity Ankle Circles/Pumps: AROM, Both, 15 reps, Seated Quad Sets: Strengthening, Both, 10 reps, Supine Gluteal Sets: Strengthening, Both, 10 reps, Supine Long Arc Quad: Strengthening, Both, 10 reps, Seated    General Comments General comments (skin integrity, edema, etc.): Frequent bowel incontience while assisting pt in standing. Assisted with peri-care. Pt able to stand >10 min while performing hygiene.      Pertinent Vitals/Pain Pain Assessment Pain Assessment: No/denies pain    Home Living  Prior Function            PT Goals (current goals can now be found in the care plan section) Acute Rehab PT Goals Patient Stated Goal: return home PT Goal Formulation: With patient Time For Goal Achievement: 12/29/23 Potential to Achieve Goals: Good Progress towards PT goals: Progressing toward goals    Frequency    Min 2X/week      PT Plan      Co-evaluation              AM-PAC PT 6 Clicks Mobility   Outcome Measure  Help needed turning from your  back to your side while in a flat bed without using bedrails?: None Help needed moving from lying on your back to sitting on the side of a flat bed without using bedrails?: A Little Help needed moving to and from a bed to a chair (including a wheelchair)?: A Little Help needed standing up from a chair using your arms (e.g., wheelchair or bedside chair)?: A Little Help needed to walk in hospital room?: A Little Help needed climbing 3-5 steps with a railing? : A Little 6 Click Score: 19    End of Session   Activity Tolerance: Patient tolerated treatment well Patient left: with call bell/phone within reach;with family/visitor present;in chair;with chair alarm set;with SCD's reapplied Nurse Communication: Mobility status PT Visit Diagnosis: Other abnormalities of gait and mobility (R26.89);Muscle weakness (generalized) (M62.81);Difficulty in walking, not elsewhere classified (R26.2)     Time: 8993-8972 PT Time Calculation (min) (ACUTE ONLY): 21 min  Charges:    $Therapeutic Activity: 8-22 mins PT General Charges $$ ACUTE PT VISIT: 1 Visit                     Nicholas Burke, PT, DPT [facility]  Rehabilitation Services Physical Therapist Office: [phone] Website: [email or web address]    Nicholas Burke 12/24/2023, 11:59 AM

## 2023-12-24 NOTE — Progress Notes (Signed)
 Subjective: Patient seen and examined at bedside in presence of his sister.  Denies abdominal pain.  States he has been having brown stools.  Objective: Vital signs in last 24 hours: Temp:  [97 F (36.1 C)-98.1 F (36.7 C)] 97 F (36.1 C) (08/29 1100) Pulse Rate:  [71-107] 77 (08/29 1058) Resp:  [14-30] 24 (08/29 1000) BP: (98-134)/(57-88) 124/77 (08/29 1058) SpO2:  [71 %-100 %] 94 % (08/29 1000) Weight:  [101.1 kg] 101.1 kg (08/29 0545) Weight change: -1.2 kg Last BM Date : 12/24/23  PE: Sitting on bedside chair GENERAL: Prominent pallor ABDOMEN: Distended abdomen but nontender EXTREMITIES: No deformity  Lab Results: Results for orders placed or performed during the hospital encounter of 12/06/23 (from the past 48 hours)  Glucose, capillary     Status: Abnormal   Collection Time: 12/22/23 11:25 AM  Result Value Ref Range   Glucose-Capillary 147 (H) 70 - 99 mg/dL    Comment: Glucose reference range applies only to samples taken after fasting for at least 8 hours.  Glucose, capillary     Status: Abnormal   Collection Time: 12/22/23  4:01 PM  Result Value Ref Range   Glucose-Capillary 121 (H) 70 - 99 mg/dL    Comment: Glucose reference range applies only to samples taken after fasting for at least 8 hours.  Glucose, capillary     Status: Abnormal   Collection Time: 12/22/23  8:05 PM  Result Value Ref Range   Glucose-Capillary 122 (H) 70 - 99 mg/dL    Comment: Glucose reference range applies only to samples taken after fasting for at least 8 hours.  Glucose, capillary     Status: Abnormal   Collection Time: 12/23/23 12:01 AM  Result Value Ref Range   Glucose-Capillary 118 (H) 70 - 99 mg/dL    Comment: Glucose reference range applies only to samples taken after fasting for at least 8 hours.  Glucose, capillary     Status: Abnormal   Collection Time: 12/23/23  4:04 AM  Result Value Ref Range   Glucose-Capillary 141 (H) 70 - 99 mg/dL    Comment: Glucose reference range  applies only to samples taken after fasting for at least 8 hours.  CBC     Status: Abnormal   Collection Time: 12/23/23  4:12 AM  Result Value Ref Range   WBC 8.0 4.0 - 10.5 K/uL   RBC 2.39 (L) 4.22 - 5.81 MIL/uL   Hemoglobin 7.6 (L) 13.0 - 17.0 g/dL   HCT 77.0 (L) 60.9 - 47.9 %   MCV 95.8 80.0 - 100.0 fL   MCH 31.8 26.0 - 34.0 pg   MCHC 33.2 30.0 - 36.0 g/dL   RDW 81.9 (H) 88.4 - 84.4 %   Platelets 219 150 - 400 K/uL   nRBC 0.4 (H) 0.0 - 0.2 %    Comment: Performed at [facility], [address], [place], [place] [address]  Glucose, capillary     Status: Abnormal   Collection Time: 12/23/23  7:53 AM  Result Value Ref Range   Glucose-Capillary 132 (H) 70 - 99 mg/dL    Comment: Glucose reference range applies only to samples taken after fasting for at least 8 hours.  Comprehensive metabolic panel with GFR     Status: Abnormal   Collection Time: 12/23/23  9:05 AM  Result Value Ref Range   Sodium 142 135 - 145 mmol/L   Potassium 3.9 3.5 - 5.1 mmol/L   Chloride 113 (H) 98 - 111 mmol/L  CO2 22 22 - 32 mmol/L   Glucose, Bld 185 (H) 70 - 99 mg/dL    Comment: Glucose reference range applies only to samples taken after fasting for at least 8 hours.   BUN 22 8 - 23 mg/dL   Creatinine, Ser 8.70 (H) 0.61 - 1.24 mg/dL   Calcium  7.6 (L) 8.9 - 10.3 mg/dL   Total Protein 5.1 (L) 6.5 - 8.1 g/dL   Albumin  1.7 (L) 3.5 - 5.0 g/dL   AST 37 15 - 41 U/L   ALT 32 0 - 44 U/L   Alkaline Phosphatase 71 38 - 126 U/L   Total Bilirubin 0.7 0.0 - 1.2 mg/dL   GFR, Estimated 57 (L) >60 mL/min    Comment: (NOTE) Calculated using the CKD-EPI Creatinine Equation (2021)    Anion gap 7 5 - 15    Comment: Performed at [facility], [address] [place], [place] [address]  Magnesium      Status: None   Collection Time: 12/23/23  9:05 AM  Result Value Ref Range   Magnesium  1.9 1.7 - 2.4 mg/dL    Comment: Performed at [facility], [address], [place], [place] [address]  Phosphorus      Status: None   Collection Time: 12/23/23  9:05 AM  Result Value Ref Range   Phosphorus 3.4 2.5 - 4.6 mg/dL    Comment: Performed at [facility], [address], [place], [place] [address]  Glucose, capillary     Status: Abnormal   Collection Time: 12/23/23 11:09 AM  Result Value Ref Range   Glucose-Capillary 122 (H) 70 - 99 mg/dL    Comment: Glucose reference range applies only to samples taken after fasting for at least 8 hours.  Glucose, capillary     Status: Abnormal   Collection Time: 12/23/23  3:34 PM  Result Value Ref Range   Glucose-Capillary 121 (H) 70 - 99 mg/dL    Comment: Glucose reference range applies only to samples taken after fasting for at least 8 hours.  CBC     Status: Abnormal   Collection Time: 12/23/23  5:48 PM  Result Value Ref Range   WBC 7.9 4.0 - 10.5 K/uL   RBC 2.50 (L) 4.22 - 5.81 MIL/uL   Hemoglobin 7.7 (L) 13.0 - 17.0 g/dL   HCT 75.4 (L) 60.9 - 47.9 %   MCV 98.0 80.0 - 100.0 fL   MCH 30.8 26.0 - 34.0 pg   MCHC 31.4 30.0 - 36.0 g/dL   RDW 81.9 (H) 88.4 - 84.4 %   Platelets 226 150 - 400 K/uL   nRBC 0.3 (H) 0.0 - 0.2 %    Comment: Performed at [facility], [address] [place], [place] [address]  Glucose, capillary     Status: Abnormal   Collection Time: 12/23/23  7:38 PM  Result Value Ref Range   Glucose-Capillary 121 (H) 70 - 99 mg/dL    Comment: Glucose reference range applies only to samples taken after fasting for at least 8 hours.  Glucose, capillary     Status: Abnormal   Collection Time: 12/24/23 12:00 AM  Result Value Ref Range   Glucose-Capillary 146 (H) 70 - 99 mg/dL    Comment: Glucose reference range applies only to samples taken after fasting for at least 8 hours.  Glucose, capillary     Status: Abnormal   Collection Time: 12/24/23  3:57 AM  Result Value Ref Range   Glucose-Capillary  144 (H) 70 - 99 mg/dL    Comment: Glucose reference range applies only to samples taken after fasting for at least 8 hours.  CBC      Status: Abnormal   Collection Time: 12/24/23  4:14 AM  Result Value Ref Range   WBC 7.3 4.0 - 10.5 K/uL   RBC 2.40 (L) 4.22 - 5.81 MIL/uL   Hemoglobin 7.4 (L) 13.0 - 17.0 g/dL   HCT 76.6 (L) 60.9 - 47.9 %   MCV 97.1 80.0 - 100.0 fL   MCH 30.8 26.0 - 34.0 pg   MCHC 31.8 30.0 - 36.0 g/dL   RDW 81.8 (H) 88.4 - 84.4 %   Platelets 244 150 - 400 K/uL   nRBC 0.3 (H) 0.0 - 0.2 %    Comment: Performed at [facility], [address] [place], [place] [address]  CBC     Status: Abnormal   Collection Time: 12/24/23  5:26 AM  Result Value Ref Range   WBC 7.4 4.0 - 10.5 K/uL   RBC 2.45 (L) 4.22 - 5.81 MIL/uL   Hemoglobin 7.5 (L) 13.0 - 17.0 g/dL   HCT 76.9 (L) 60.9 - 47.9 %   MCV 93.9 80.0 - 100.0 fL   MCH 30.6 26.0 - 34.0 pg   MCHC 32.6 30.0 - 36.0 g/dL   RDW 82.4 (H) 88.4 - 84.4 %   Platelets 262 150 - 400 K/uL   nRBC 0.0 0.0 - 0.2 %    Comment: Performed at [facility], [address], [place], [place] [address]  Basic metabolic panel with GFR     Status: Abnormal   Collection Time: 12/24/23  6:20 AM  Result Value Ref Range   Sodium 141 135 - 145 mmol/L   Potassium 4.0 3.5 - 5.1 mmol/L   Chloride 109 98 - 111 mmol/L   CO2 22 22 - 32 mmol/L   Glucose, Bld 141 (H) 70 - 99 mg/dL    Comment: Glucose reference range applies only to samples taken after fasting for at least 8 hours.   BUN 21 8 - 23 mg/dL   Creatinine, Ser 8.53 (H) 0.61 - 1.24 mg/dL   Calcium  7.8 (L) 8.9 - 10.3 mg/dL   GFR, Estimated 50 (L) >60 mL/min    Comment: (NOTE) Calculated using the CKD-EPI Creatinine Equation (2021)    Anion gap 10 5 - 15    Comment: Performed at [facility], [address], [place], [place] [address]  Glucose, capillary     Status: Abnormal   Collection Time: 12/24/23  7:34 AM  Result Value Ref Range   Glucose-Capillary 139 (H) 70 - 99 mg/dL    Comment: Glucose reference range applies only to samples taken after fasting for at least 8 hours.    Studies/Results: No  results found.  Medications: I have reviewed the patient's current medications.  Assessment: EGD 12/20/2023 showed clotted blood in gastric body and gastric fundus with interference of mucosal visualization to identify site of bleeding Hemoglobin 7.4 yesterday and 7.5 today  Intramural hematoma extending from origin of left subclavian artery into abdomen, right common and external iliac arteries with  increase in aneurysmal dilation of proximal descending aorta measuring 4.9 cm-on IV labetalol   small right pleural effusion, small left pleural effusion-was given diuretics Large right inguinal hernia History of prostate cancer   Plan: Discussed with patient and his sister at bedside regarding options of either advancing diet versus repeating EGD. They would  like to proceed with endoscopic evaluation of gastric body and fundus. Continue clear liquid diet today and keep n.p.o. postmidnight for procedure to be performed in a.m. tomorrow. Continue pantoprazole  40 mg IV every 12 hours for now.  Estelita Manas, MD 12/24/2023, 11:18 AM

## 2023-12-25 ENCOUNTER — Encounter (HOSPITAL_COMMUNITY)
Admission: EM | Disposition: A | Discharge status: Home / Self Care | Payer: Self-pay | Source: Home / Self Care | Attending: Internal Medicine

## 2023-12-25 ENCOUNTER — Encounter (HOSPITAL_COMMUNITY): Payer: Self-pay | Admitting: Anesthesiology

## 2023-12-25 DIAGNOSIS — I7100 Dissection of unspecified site of aorta: Secondary | ICD-10-CM | POA: Diagnosis not present

## 2023-12-25 DIAGNOSIS — K567 Ileus, unspecified: Secondary | ICD-10-CM | POA: Diagnosis not present

## 2023-12-25 DIAGNOSIS — K922 Gastrointestinal hemorrhage, unspecified: Secondary | ICD-10-CM | POA: Diagnosis not present

## 2023-12-25 DIAGNOSIS — J690 Pneumonitis due to inhalation of food and vomit: Secondary | ICD-10-CM | POA: Diagnosis not present

## 2023-12-25 LAB — BASIC METABOLIC PANEL WITH GFR
Anion gap: 8 (ref 5–15)
BUN: 18 mg/dL (ref 8–23)
CO2: 21 mmol/L — ABNORMAL LOW (ref 22–32)
Calcium: 7.8 mg/dL — ABNORMAL LOW (ref 8.9–10.3)
Chloride: 113 mmol/L — ABNORMAL HIGH (ref 98–111)
Creatinine, Ser: 1.26 mg/dL — ABNORMAL HIGH (ref 0.61–1.24)
GFR, Estimated: 59 mL/min — ABNORMAL LOW (ref >60)
Glucose, Bld: 110 mg/dL — ABNORMAL HIGH (ref 70–99)
Potassium: 4.3 mmol/L (ref 3.5–5.1)
Sodium: 142 mmol/L (ref 135–145)

## 2023-12-25 LAB — CBC
HCT: 24.6 % — ABNORMAL LOW (ref 39.0–52.0)
Hemoglobin: 7.8 g/dL — ABNORMAL LOW (ref 13.0–17.0)
MCH: 28.9 pg (ref 26.0–34.0)
MCHC: 31.7 g/dL (ref 30.0–36.0)
MCV: 91.1 fL (ref 80.0–100.0)
Platelets: 259 K/uL (ref 150–400)
RBC: 2.7 MIL/uL — ABNORMAL LOW (ref 4.22–5.81)
RDW: 16.8 % — ABNORMAL HIGH (ref 11.5–15.5)
WBC: 8.2 K/uL (ref 4.0–10.5)
nRBC: 0.2 % (ref 0.0–0.2)

## 2023-12-25 LAB — GLUCOSE, CAPILLARY
Glucose-Capillary: 101 mg/dL — ABNORMAL HIGH (ref 70–99)
Glucose-Capillary: 114 mg/dL — ABNORMAL HIGH (ref 70–99)
Glucose-Capillary: 136 mg/dL — ABNORMAL HIGH (ref 70–99)
Glucose-Capillary: 143 mg/dL — ABNORMAL HIGH (ref 70–99)
Glucose-Capillary: 144 mg/dL — ABNORMAL HIGH (ref 70–99)
Glucose-Capillary: 147 mg/dL — ABNORMAL HIGH (ref 70–99)

## 2023-12-25 SURGERY — CANCELLED PROCEDURE

## 2023-12-25 MED ORDER — METOCLOPRAMIDE HCL 5 MG/ML IJ SOLN
10.0000 mg | Freq: Three times a day (TID) | INTRAMUSCULAR | Status: DC
  Administered 2023-12-25: 10 mg via INTRAVENOUS
  Filled 2023-12-25: qty 2

## 2023-12-25 MED ORDER — HYDRALAZINE HCL 20 MG/ML IJ SOLN
10.0000 mg | INTRAMUSCULAR | Status: DC | PRN
  Administered 2023-12-25: 10 mg via INTRAVENOUS
  Filled 2023-12-25: qty 1

## 2023-12-25 MED ORDER — HYDRALAZINE HCL 25 MG PO TABS
25.0000 mg | ORAL_TABLET | Freq: Three times a day (TID) | ORAL | Status: DC
  Administered 2023-12-25 – 2023-12-26 (×4): 25 mg via ORAL
  Filled 2023-12-25 (×4): qty 1

## 2023-12-25 MED ORDER — LABETALOL HCL 5 MG/ML IV SOLN
5.0000 mg | INTRAVENOUS | Status: DC | PRN
  Administered 2023-12-25: 10 mg via INTRAVENOUS
  Filled 2023-12-25: qty 4

## 2023-12-25 MED ORDER — TRAVASOL 10 % IV SOLN
INTRAVENOUS | Status: AC
  Filled 2023-12-25: qty 998.4

## 2023-12-25 MED ORDER — NEOSTIGMINE METHYLSULFATE 10 MG/10ML IV SOLN
0.2500 mg | Freq: Once | INTRAVENOUS | Status: AC
  Administered 2023-12-25: 0.25 mg via SUBCUTANEOUS
  Filled 2023-12-25: qty 0.25

## 2023-12-25 MED ORDER — SODIUM CHLORIDE 0.9 % IV SOLN: INTRAVENOUS | Status: DC

## 2023-12-25 MED ORDER — SODIUM CHLORIDE 0.9 % IV SOLN
250.0000 mg | Freq: Three times a day (TID) | INTRAVENOUS | Status: AC
  Administered 2023-12-25 – 2023-12-27 (×6): 250 mg via INTRAVENOUS
  Filled 2023-12-25 (×7): qty 5

## 2023-12-25 MED ORDER — CARVEDILOL 12.5 MG PO TABS
12.5000 mg | ORAL_TABLET | Freq: Two times a day (BID) | ORAL | Status: DC
  Administered 2023-12-25 – 2023-12-26 (×3): 12.5 mg via ORAL
  Filled 2023-12-25 (×3): qty 1

## 2023-12-25 MED ORDER — MENTHOL 3 MG MT LOZG
1.0000 | LOZENGE | OROMUCOSAL | Status: DC | PRN
  Administered 2023-12-26: 3 mg via ORAL
  Filled 2023-12-25: qty 9

## 2023-12-25 MED ORDER — SIMETHICONE 80 MG PO CHEW
80.0000 mg | CHEWABLE_TABLET | Freq: Four times a day (QID) | ORAL | Status: DC | PRN
  Administered 2023-12-25: 80 mg via ORAL
  Filled 2023-12-25: qty 1

## 2023-12-25 NOTE — Progress Notes (Addendum)
 NAME:  Nicholas Burke, MRN:  [id number], DOB:  03-27-1946, LOS: 19 ADMISSION DATE:  12/06/2023, CONSULTATION DATE:  12/06/2023 REFERRING MD: Dr. Celinda - TRH, CHIEF COMPLAINT: Intramural hematoma  History of Present Illness:  Nicholas Burke is a 78 year old male with a past medical history significant for prediabetes, hypertension, and prostate cancer who presented to the ED at [facility] 8/11 for complaints of epigastric pain.  On ED arrival patient was seen hypertensive with all other vital signs within normal limit.  CT abdomen pelvis obtained and revealed extensive intramural hematoma involving the descending thoracic aorta extending throughout the abdominal aorta and into the right external iliac artery.  CTA chest again confirms intramural hematoma beginning at the transverse aorta origin of the left subclavian artery and extending throughout the thoracic aorta and into the abdominal aorta.  Lab work significant for potassium 2.9.  Vascular surgery evaluated patient in ED and recommended tight blood pressure and heart rate control, PCCM consulted in this setting.   Pertinent  Medical History  prediabetes, hypertension, and prostate cancer  Significant Hospital Events: Including procedures, antibiotic start and stop dates in addition to other pertinent events   8/11 presented with epigastric abdominal pain found to have large intramural hematoma 8/14 remains on esmolol , no indication for urgent surgical intervention 8/15 ongoing issues with ileus 8/16 NGT placed to decompress 8/19 continued high output from NGT. Still on Cleviprex  and esmolol . Seen By GI in consult. Stopped CCB due to risk of constipation. Started TPN, PICC placed. 8/20 new tremor stopped the reglan . Still trying to get control of BP and HR. Started scheduled IV hydral + esmolol . 8/21 Patient became hypotensive, clonidine  patch was removed, hydralazine , esmolol  were stopped 8/22 on low-dose labetalol  infusion 8/23 had multiple small  bowel movements, denies abdominal pain, NG output was 550 cc 8/24 BMs  8/25 hgb drop 5.3, 2 PRBC. AKI  8/25 EGD - Normal larynx. - Tiny hiatal hernia. - Clotted blood in the gastric fundus and in the proximal gastric body. - Normal duodenal bulb, first portion of the duodenum, second portion of the duodenum 8/25 CTA chest/abd/pelvis>>1. No significant change in the previously demonstrated intramural hematoma extending from the origin of the left subclavian artery into the abdomen and right common and external iliac arteries. 2. Interval increase in aneurysmal dilatation of the proximal descending thoracic aorta, measuring 4.9 cm in diameter. 3. Interval small amount of patchy density in the posterior right upper lobe, compatible with pneumonia. 4. Interval small right pleural effusion and increased size of the previously demonstrated small left pleural effusion, currently small to moderate in size.  5. Interval dilatation of the stomach and mild dilatation of the proximal and mid small bowel loops with some low-density wall thickening in the pelvis. This is compatible with enteritis with partial small bowel obstruction or gastric and small-bowel ileus. 6. No evidence of active gastrointestinal bleeding. 7. Large right inguinal hernia containing fat and fluid, moderate-sized left inguinal hernia containing fat and small umbilical hernia containing fat. 8.  Calcific coronary artery and aortic atherosclerosis. 8/29 adding PO antiHTN   Interim History / Subjective:  BM x 3 yesterday. Back on labetalol  drip Abd feels more distended this am per him  Objective    Blood pressure (!) 140/86, pulse 78, temperature 97.6 F (36.4 C), temperature source Axillary, resp. rate (!) 28, height 5' 10 (1.778 m), weight 98.9 kg, SpO2 97%.        Intake/Output Summary (Last 24 hours) at 12/25/2023 0847 Last data  filed at 12/25/2023 0800 Gross per 24 hour  Intake 2862.77 ml  Output 2225 ml  Net 637.77 ml   Filed  Weights   12/23/23 0300 12/24/23 0545 12/25/23 0335  Weight: 102.3 kg 101.1 kg 98.9 kg    Physical exam No distress Abd is protuberant and a little firmer, does have BS Lungs sound clear Aox3 Globally weak but stable TPN ongoing  Resolved    Lactic acidosis on 8/17 Hypernatremia Hypokalemia HTN emergency  Aspiration PNA RLL  Assessment and Plan    Type B dissection, intramural hematoma HTN  Postop ileus Postop UGIB- suspect 2/2 suction trauma but mucosa not well-visualized on EGD 8/25 Postop deconditioning  - Trial of reg diet - Discussed with anesthesia/GI, would like off labetalol  gtt if possible for relook EGD as nonurgent - Increase coreg , start PO hydralazine ; wean labetalol  for SBP < 160 - Check KUB, give reglan  and neostigmine  x 1 empirically as suspect belly backing up a bit - PPI, trend H/H, relook EGD tentatively tomorrow - TPN until caloric intake improves - OP VVS f/u eventually - Push mobility - Daughter and patient updated at bedside  34 min cc time Rolan Sharps MD PCCM

## 2023-12-25 NOTE — Progress Notes (Signed)
 Subjective: Patient was restarted on IV labetalol  drip and EGD was canceled for today, due to anesthesia concerns that stimulation during EGD might worsen elevation in blood pressure.  Objective: Vital signs in last 24 hours: Temp:  [97 F (36.1 C)-98.4 F (36.9 C)] 97.6 F (36.4 C) (08/30 0735) Pulse Rate:  [75-94] 82 (08/30 0930) Resp:  [11-34] 25 (08/30 0930) BP: (113-157)/(60-122) 123/75 (08/30 0930) SpO2:  [83 %-100 %] 96 % (08/30 0930) Weight:  [98.9 kg] 98.9 kg (08/30 0335) Weight change: -2.2 kg Last BM Date : 12/24/23  PE: Sitting comfortably by bedside chair GENERAL: Prominent pallor  ABDOMEN: Distended abdomen, nontender, sluggish bowel sounds EXTREMITIES: No deformity  Lab Results: Results for orders placed or performed during the hospital encounter of 12/06/23 (from the past 48 hours)  Glucose, capillary     Status: Abnormal   Collection Time: 12/23/23 11:09 AM  Result Value Ref Range   Glucose-Capillary 122 (H) 70 - 99 mg/dL    Comment: Glucose reference range applies only to samples taken after fasting for at least 8 hours.  Glucose, capillary     Status: Abnormal   Collection Time: 12/23/23  3:34 PM  Result Value Ref Range   Glucose-Capillary 121 (H) 70 - 99 mg/dL    Comment: Glucose reference range applies only to samples taken after fasting for at least 8 hours.  CBC     Status: Abnormal   Collection Time: 12/23/23  5:48 PM  Result Value Ref Range   WBC 7.9 4.0 - 10.5 K/uL   RBC 2.50 (L) 4.22 - 5.81 MIL/uL   Hemoglobin 7.7 (L) 13.0 - 17.0 g/dL   HCT 75.4 (L) 60.9 - 47.9 %   MCV 98.0 80.0 - 100.0 fL   MCH 30.8 26.0 - 34.0 pg   MCHC 31.4 30.0 - 36.0 g/dL   RDW 81.9 (H) 88.4 - 84.4 %   Platelets 226 150 - 400 K/uL   nRBC 0.3 (H) 0.0 - 0.2 %    Comment: Performed at [facility], [address], [place], [place] [address]  Glucose, capillary     Status: Abnormal   Collection Time: 12/23/23  7:38 PM  Result Value Ref Range   Glucose-Capillary 121  (H) 70 - 99 mg/dL    Comment: Glucose reference range applies only to samples taken after fasting for at least 8 hours.  Glucose, capillary     Status: Abnormal   Collection Time: 12/24/23 12:00 AM  Result Value Ref Range   Glucose-Capillary 146 (H) 70 - 99 mg/dL    Comment: Glucose reference range applies only to samples taken after fasting for at least 8 hours.  Glucose, capillary     Status: Abnormal   Collection Time: 12/24/23  3:57 AM  Result Value Ref Range   Glucose-Capillary 144 (H) 70 - 99 mg/dL    Comment: Glucose reference range applies only to samples taken after fasting for at least 8 hours.  CBC     Status: Abnormal   Collection Time: 12/24/23  4:14 AM  Result Value Ref Range   WBC 7.3 4.0 - 10.5 K/uL   RBC 2.40 (L) 4.22 - 5.81 MIL/uL   Hemoglobin 7.4 (L) 13.0 - 17.0 g/dL   HCT 76.6 (L) 60.9 - 47.9 %   MCV 97.1 80.0 - 100.0 fL   MCH 30.8 26.0 - 34.0 pg   MCHC 31.8 30.0 - 36.0 g/dL   RDW 81.8 (H) 88.4 - 84.4 %   Platelets  244 150 - 400 K/uL   nRBC 0.3 (H) 0.0 - 0.2 %    Comment: Performed at [facility], [address], [place], [place] [address]  CBC     Status: Abnormal   Collection Time: 12/24/23  5:26 AM  Result Value Ref Range   WBC 7.4 4.0 - 10.5 K/uL   RBC 2.45 (L) 4.22 - 5.81 MIL/uL   Hemoglobin 7.5 (L) 13.0 - 17.0 g/dL   HCT 76.9 (L) 60.9 - 47.9 %   MCV 93.9 80.0 - 100.0 fL   MCH 30.6 26.0 - 34.0 pg   MCHC 32.6 30.0 - 36.0 g/dL   RDW 82.4 (H) 88.4 - 84.4 %   Platelets 262 150 - 400 K/uL   nRBC 0.0 0.0 - 0.2 %    Comment: Performed at [facility], [address] [place], [place] [address]  Basic metabolic panel with GFR     Status: Abnormal   Collection Time: 12/24/23  6:20 AM  Result Value Ref Range   Sodium 141 135 - 145 mmol/L   Potassium 4.0 3.5 - 5.1 mmol/L   Chloride 109 98 - 111 mmol/L   CO2 22 22 - 32 mmol/L   Glucose, Bld 141 (H) 70 - 99 mg/dL    Comment: Glucose reference range applies only to samples taken after fasting  for at least 8 hours.   BUN 21 8 - 23 mg/dL   Creatinine, Ser 8.53 (H) 0.61 - 1.24 mg/dL   Calcium  7.8 (L) 8.9 - 10.3 mg/dL   GFR, Estimated 50 (L) >60 mL/min    Comment: (NOTE) Calculated using the CKD-EPI Creatinine Equation (2021)    Anion gap 10 5 - 15    Comment: Performed at [facility], [address] [place], [place] [address]  Glucose, capillary     Status: Abnormal   Collection Time: 12/24/23  7:34 AM  Result Value Ref Range   Glucose-Capillary 139 (H) 70 - 99 mg/dL    Comment: Glucose reference range applies only to samples taken after fasting for at least 8 hours.  Glucose, capillary     Status: Abnormal   Collection Time: 12/24/23 11:12 AM  Result Value Ref Range   Glucose-Capillary 152 (H) 70 - 99 mg/dL    Comment: Glucose reference range applies only to samples taken after fasting for at least 8 hours.  Glucose, capillary     Status: Abnormal   Collection Time: 12/24/23  3:37 PM  Result Value Ref Range   Glucose-Capillary 133 (H) 70 - 99 mg/dL    Comment: Glucose reference range applies only to samples taken after fasting for at least 8 hours.  Glucose, capillary     Status: None   Collection Time: 12/24/23  7:21 PM  Result Value Ref Range   Glucose-Capillary 86 70 - 99 mg/dL    Comment: Glucose reference range applies only to samples taken after fasting for at least 8 hours.  Glucose, capillary     Status: Abnormal   Collection Time: 12/24/23 11:57 PM  Result Value Ref Range   Glucose-Capillary 115 (H) 70 - 99 mg/dL    Comment: Glucose reference range applies only to samples taken after fasting for at least 8 hours.  Glucose, capillary     Status: Abnormal   Collection Time: 12/25/23  3:20 AM  Result Value Ref Range   Glucose-Capillary 136 (H) 70 - 99 mg/dL    Comment: Glucose reference range applies only  to samples taken after fasting for at least 8 hours.  CBC     Status: Abnormal   Collection Time: 12/25/23  3:26 AM  Result Value Ref Range   WBC  8.2 4.0 - 10.5 K/uL   RBC 2.70 (L) 4.22 - 5.81 MIL/uL   Hemoglobin 7.8 (L) 13.0 - 17.0 g/dL   HCT 75.3 (L) 60.9 - 47.9 %   MCV 91.1 80.0 - 100.0 fL   MCH 28.9 26.0 - 34.0 pg   MCHC 31.7 30.0 - 36.0 g/dL   RDW 83.1 (H) 88.4 - 84.4 %   Platelets 259 150 - 400 K/uL   nRBC 0.2 0.0 - 0.2 %    Comment: Performed at [facility], [address], [place], [place] [address]  Glucose, capillary     Status: Abnormal   Collection Time: 12/25/23  7:43 AM  Result Value Ref Range   Glucose-Capillary 147 (H) 70 - 99 mg/dL    Comment: Glucose reference range applies only to samples taken after fasting for at least 8 hours.    Studies/Results: No results found.  Medications: I have reviewed the patient's current medications.  Assessment: EGD from 12/20/2023 showed clotted blood in gastric body and gastric fundus Hemoglobin stable at 7.8, last bowel movement yesterday reported as brown BUN normal at 21 and trending down Continued on pantoprazole  40 mg every 12 hours  Intramural hematoma extending from origin of left subclavian artery into abdomen, right common and external iliac arteries  Increase in aneurysmal dilation of proximal descending aorta measuring 4.9 cm  Small right pleural effusion, small left pleural effusion Large inguinal hernia on right side History of prostate cancer  Distended abdomen, on Reglan  10 mg IV every 8 hours started by primary team for 2 days. Neostigmine  0.25 mg subcu x 1 ordered for today morning at 945am Continued on MiraLAX  17 g daily    Plan: EGD planned for today canceled as patient has been restarted on IV labetalol  since today morning. There have been changes made to oral antihypertensives, currently on Coreg  12.5 mg twice a day, hydralazine  25 mg every 8 hours and 10 mg IV every 4 hours as needed.  Patient does not have symptoms of ileus, he is passing gas, had bowel movement yesterday, does not have nausea or vomiting, however his abdominal  distention worsens, recommend getting an abdominal x-ray for further evaluation.  Discussed about mobilization, try to walk around hallways of possible.  Will reschedule EGD for tomorrow morning, hopefully patient will be off of IV labetalol  and hemodynamically stable for anesthesia and EGD tomorrow.  Estelita Manas, MD 12/25/2023, 9:44 AM

## 2023-12-25 NOTE — Progress Notes (Signed)
 PHARMACY - TOTAL PARENTERAL NUTRITION CONSULT NOTE   Indication: Prolonged ileus  Patient Measurements: Height: 5' 10 (177.8 cm) Weight: 98.9 kg (218 lb 0.6 oz) IBW/kg (Calculated) : 73 TPN AdjBW (KG): 90.7 Body mass index is 31.28 kg/m. Usual Weight: 91.2 kg   Assessment:  Patient is a 78 yo M admitted with aortic dissection with intramural hematoma. Patient developed severe ileus secondary to dissection. Patient was active and had good nutrition PTA. Without nutrition for 7 days at the start of TPN. Patient at moderate risk of refeeding.   8/27 Patient is passing minimal gas. NG tube is removed and patient has not had significant N/V or distention per GI.   Glucose / Insulin : A1c 7.1, CBGs 86-147 with 14 units inTPN +  SSI (5u in 24h) Electrolytes: Na 141; K 4  (goal 4-4.5 per primary), CoCa 9.6, Cl 109, CO2 22 [bmet drawn 8/30 hemolyzed. Second sample sent. In process x2 hours] Renal: Scr bumped at 1.46 (last lasix  8/27), BUN 21 Hepatic: AST/ALT wnl, T bili 0.7, Alb 1.7 Intake / Output; MIVF: UOP ~1 ml/kg/hr, Stool x2 yesterday (liquid)  GI Imaging:  8/11 CT abd- Extensive intramural hematoma involving the descending thoracic aorta extending throughout the abdominal aorta and into the right inflow vessels terminating in the mid right external iliac artery. 8/14 DG abd- There is increased amount of gas throughout the small bowel and colon, likely manifestation of adynamic ileus 8/17 DG abd- Persistent but improved gaseous gastric distension  8/19 DG abd- unchanged 8/24 DG abd: Diffuse air distension of the small bowel relatively similar to prior radiograph  GI Surgeries / Procedures:  8/25 EGD - minimal NG tube trauma, old clotted blood was found in the gastric fundus 8/28 plan for repeat EGD 8/31 EGD  Central access: PICC  TPN start date: 8/19  Nutritional Goals: Goal TPN rate is 80 mL/hr (provides 100 g of protein and 1994 kcals per day)  RD Assessment: Estimated  Needs Total Energy Estimated Needs: 1900-2100 kcals Total Protein Estimated Needs: 95-105 g Total Fluid Estimated Needs: >/= 2L (not taking into account high NG output)  Current Nutrition:  Clear liquid diet TPN  Plan: Continue TPN at 80 mL/hr which provides 100% of estimated daily caloric needs  Electrolytes in TPN:  No changes- Na to 20 mEq/L, K 30 mEq/L, Ca 2 mEq/L, Mg 8 mEq/L, Phos 12 mmol/L.  Cl:Ac adjusted to 1:2 Add standard MVI and trace elements to TPN Sensitive SSI q4h and adjust as needed  Insulin  regular to 14 units in TPN Monitor TPN labs on Mon/Thurs, 8/31 [bmet] Monitor diet advancement as tolerated  Thank you for allowing pharmacy to be a part of this patient's care.   Benedetta Heath BS, PharmD, BCPS Clinical Pharmacist 12/25/2023 12:18 PM  Contact: [phone] after 3 PM

## 2023-12-25 NOTE — H&P (View-Only) (Signed)
 Subjective: Patient was restarted on IV labetalol  drip and EGD was canceled for today, due to anesthesia concerns that stimulation during EGD might worsen elevation in blood pressure.  Objective: Vital signs in last 24 hours: Temp:  [97 F (36.1 C)-98.4 F (36.9 C)] 97.6 F (36.4 C) (08/30 0735) Pulse Rate:  [75-94] 82 (08/30 0930) Resp:  [11-34] 25 (08/30 0930) BP: (113-157)/(60-122) 123/75 (08/30 0930) SpO2:  [83 %-100 %] 96 % (08/30 0930) Weight:  [98.9 kg] 98.9 kg (08/30 0335) Weight change: -2.2 kg Last BM Date : 12/24/23  PE: Sitting comfortably by bedside chair GENERAL: Prominent pallor  ABDOMEN: Distended abdomen, nontender, sluggish bowel sounds EXTREMITIES: No deformity  Lab Results: Results for orders placed or performed during the hospital encounter of 12/06/23 (from the past 48 hours)  Glucose, capillary     Status: Abnormal   Collection Time: 12/23/23 11:09 AM  Result Value Ref Range   Glucose-Capillary 122 (H) 70 - 99 mg/dL    Comment: Glucose reference range applies only to samples taken after fasting for at least 8 hours.  Glucose, capillary     Status: Abnormal   Collection Time: 12/23/23  3:34 PM  Result Value Ref Range   Glucose-Capillary 121 (H) 70 - 99 mg/dL    Comment: Glucose reference range applies only to samples taken after fasting for at least 8 hours.  CBC     Status: Abnormal   Collection Time: 12/23/23  5:48 PM  Result Value Ref Range   WBC 7.9 4.0 - 10.5 K/uL   RBC 2.50 (L) 4.22 - 5.81 MIL/uL   Hemoglobin 7.7 (L) 13.0 - 17.0 g/dL   HCT 75.4 (L) 60.9 - 47.9 %   MCV 98.0 80.0 - 100.0 fL   MCH 30.8 26.0 - 34.0 pg   MCHC 31.4 30.0 - 36.0 g/dL   RDW 81.9 (H) 88.4 - 84.4 %   Platelets 226 150 - 400 K/uL   nRBC 0.3 (H) 0.0 - 0.2 %    Comment: Performed at [facility], [address], [place], [place] [address]  Glucose, capillary     Status: Abnormal   Collection Time: 12/23/23  7:38 PM  Result Value Ref Range   Glucose-Capillary 121  (H) 70 - 99 mg/dL    Comment: Glucose reference range applies only to samples taken after fasting for at least 8 hours.  Glucose, capillary     Status: Abnormal   Collection Time: 12/24/23 12:00 AM  Result Value Ref Range   Glucose-Capillary 146 (H) 70 - 99 mg/dL    Comment: Glucose reference range applies only to samples taken after fasting for at least 8 hours.  Glucose, capillary     Status: Abnormal   Collection Time: 12/24/23  3:57 AM  Result Value Ref Range   Glucose-Capillary 144 (H) 70 - 99 mg/dL    Comment: Glucose reference range applies only to samples taken after fasting for at least 8 hours.  CBC     Status: Abnormal   Collection Time: 12/24/23  4:14 AM  Result Value Ref Range   WBC 7.3 4.0 - 10.5 K/uL   RBC 2.40 (L) 4.22 - 5.81 MIL/uL   Hemoglobin 7.4 (L) 13.0 - 17.0 g/dL   HCT 76.6 (L) 60.9 - 47.9 %   MCV 97.1 80.0 - 100.0 fL   MCH 30.8 26.0 - 34.0 pg   MCHC 31.8 30.0 - 36.0 g/dL   RDW 81.8 (H) 88.4 - 84.4 %   Platelets  244 150 - 400 K/uL   nRBC 0.3 (H) 0.0 - 0.2 %    Comment: Performed at [facility], [address], [place], [place] [address]  CBC     Status: Abnormal   Collection Time: 12/24/23  5:26 AM  Result Value Ref Range   WBC 7.4 4.0 - 10.5 K/uL   RBC 2.45 (L) 4.22 - 5.81 MIL/uL   Hemoglobin 7.5 (L) 13.0 - 17.0 g/dL   HCT 76.9 (L) 60.9 - 47.9 %   MCV 93.9 80.0 - 100.0 fL   MCH 30.6 26.0 - 34.0 pg   MCHC 32.6 30.0 - 36.0 g/dL   RDW 82.4 (H) 88.4 - 84.4 %   Platelets 262 150 - 400 K/uL   nRBC 0.0 0.0 - 0.2 %    Comment: Performed at [facility], [address] [place], [place] [address]  Basic metabolic panel with GFR     Status: Abnormal   Collection Time: 12/24/23  6:20 AM  Result Value Ref Range   Sodium 141 135 - 145 mmol/L   Potassium 4.0 3.5 - 5.1 mmol/L   Chloride 109 98 - 111 mmol/L   CO2 22 22 - 32 mmol/L   Glucose, Bld 141 (H) 70 - 99 mg/dL    Comment: Glucose reference range applies only to samples taken after fasting  for at least 8 hours.   BUN 21 8 - 23 mg/dL   Creatinine, Ser 8.53 (H) 0.61 - 1.24 mg/dL   Calcium  7.8 (L) 8.9 - 10.3 mg/dL   GFR, Estimated 50 (L) >60 mL/min    Comment: (NOTE) Calculated using the CKD-EPI Creatinine Equation (2021)    Anion gap 10 5 - 15    Comment: Performed at [facility], [address] [place], [place] [address]  Glucose, capillary     Status: Abnormal   Collection Time: 12/24/23  7:34 AM  Result Value Ref Range   Glucose-Capillary 139 (H) 70 - 99 mg/dL    Comment: Glucose reference range applies only to samples taken after fasting for at least 8 hours.  Glucose, capillary     Status: Abnormal   Collection Time: 12/24/23 11:12 AM  Result Value Ref Range   Glucose-Capillary 152 (H) 70 - 99 mg/dL    Comment: Glucose reference range applies only to samples taken after fasting for at least 8 hours.  Glucose, capillary     Status: Abnormal   Collection Time: 12/24/23  3:37 PM  Result Value Ref Range   Glucose-Capillary 133 (H) 70 - 99 mg/dL    Comment: Glucose reference range applies only to samples taken after fasting for at least 8 hours.  Glucose, capillary     Status: None   Collection Time: 12/24/23  7:21 PM  Result Value Ref Range   Glucose-Capillary 86 70 - 99 mg/dL    Comment: Glucose reference range applies only to samples taken after fasting for at least 8 hours.  Glucose, capillary     Status: Abnormal   Collection Time: 12/24/23 11:57 PM  Result Value Ref Range   Glucose-Capillary 115 (H) 70 - 99 mg/dL    Comment: Glucose reference range applies only to samples taken after fasting for at least 8 hours.  Glucose, capillary     Status: Abnormal   Collection Time: 12/25/23  3:20 AM  Result Value Ref Range   Glucose-Capillary 136 (H) 70 - 99 mg/dL    Comment: Glucose reference range applies only  to samples taken after fasting for at least 8 hours.  CBC     Status: Abnormal   Collection Time: 12/25/23  3:26 AM  Result Value Ref Range   WBC  8.2 4.0 - 10.5 K/uL   RBC 2.70 (L) 4.22 - 5.81 MIL/uL   Hemoglobin 7.8 (L) 13.0 - 17.0 g/dL   HCT 75.3 (L) 60.9 - 47.9 %   MCV 91.1 80.0 - 100.0 fL   MCH 28.9 26.0 - 34.0 pg   MCHC 31.7 30.0 - 36.0 g/dL   RDW 83.1 (H) 88.4 - 84.4 %   Platelets 259 150 - 400 K/uL   nRBC 0.2 0.0 - 0.2 %    Comment: Performed at [facility], [address], [place], [place] [address]  Glucose, capillary     Status: Abnormal   Collection Time: 12/25/23  7:43 AM  Result Value Ref Range   Glucose-Capillary 147 (H) 70 - 99 mg/dL    Comment: Glucose reference range applies only to samples taken after fasting for at least 8 hours.    Studies/Results: No results found.  Medications: I have reviewed the patient's current medications.  Assessment: EGD from 12/20/2023 showed clotted blood in gastric body and gastric fundus Hemoglobin stable at 7.8, last bowel movement yesterday reported as brown BUN normal at 21 and trending down Continued on pantoprazole  40 mg every 12 hours  Intramural hematoma extending from origin of left subclavian artery into abdomen, right common and external iliac arteries  Increase in aneurysmal dilation of proximal descending aorta measuring 4.9 cm  Small right pleural effusion, small left pleural effusion Large inguinal hernia on right side History of prostate cancer  Distended abdomen, on Reglan  10 mg IV every 8 hours started by primary team for 2 days. Neostigmine  0.25 mg subcu x 1 ordered for today morning at 945am Continued on MiraLAX  17 g daily    Plan: EGD planned for today canceled as patient has been restarted on IV labetalol  since today morning. There have been changes made to oral antihypertensives, currently on Coreg  12.5 mg twice a day, hydralazine  25 mg every 8 hours and 10 mg IV every 4 hours as needed.  Patient does not have symptoms of ileus, he is passing gas, had bowel movement yesterday, does not have nausea or vomiting, however his abdominal  distention worsens, recommend getting an abdominal x-ray for further evaluation.  Discussed about mobilization, try to walk around hallways of possible.  Will reschedule EGD for tomorrow morning, hopefully patient will be off of IV labetalol  and hemodynamically stable for anesthesia and EGD tomorrow.  Estelita Manas, MD 12/25/2023, 9:44 AM

## 2023-12-25 NOTE — Progress Notes (Signed)
 eLink Physician-Brief Progress Note Patient Name: Nicholas Burke DOB: [birth year] MRN: [id number]   Date of Service  12/25/2023  HPI/Events of Note  78 year old male with a past medical history significant for prediabetes, hypertension, and prostate cancer who presented to the ED at [facility] 8/11 for complaints of epigastric pain found to have a type B dissection with intramural hematoma and a postop ileus.  Goal SBP less than 140, goal heart rate less than 185.  SBP 148, heart rate 94 currently and due for hydralazine  at 10 PM.  Patient is having significant gas buildup, simethicone  requested by family  eICU Interventions  Update all antihypertensives to match the same goal of systolic less than 140s.  Increase ceiling on labetalol  drip to 10 mg/h  Add simethicone      Intervention Category Intermediate Interventions: Arrhythmia - evaluation and management  Anden Bartolo 12/25/2023, 9:38 PM

## 2023-12-26 ENCOUNTER — Encounter (HOSPITAL_COMMUNITY): Payer: Self-pay | Admitting: Pulmonary Disease

## 2023-12-26 ENCOUNTER — Inpatient Hospital Stay (HOSPITAL_COMMUNITY)

## 2023-12-26 ENCOUNTER — Encounter (HOSPITAL_COMMUNITY)
Admission: EM | Disposition: A | Discharge status: Home / Self Care | Payer: Self-pay | Source: Home / Self Care | Attending: Internal Medicine

## 2023-12-26 ENCOUNTER — Inpatient Hospital Stay (HOSPITAL_COMMUNITY): Admitting: Anesthesiology

## 2023-12-26 DIAGNOSIS — E119 Type 2 diabetes mellitus without complications: Secondary | ICD-10-CM

## 2023-12-26 DIAGNOSIS — K259 Gastric ulcer, unspecified as acute or chronic, without hemorrhage or perforation: Secondary | ICD-10-CM

## 2023-12-26 DIAGNOSIS — K567 Ileus, unspecified: Secondary | ICD-10-CM | POA: Diagnosis not present

## 2023-12-26 DIAGNOSIS — E782 Mixed hyperlipidemia: Secondary | ICD-10-CM | POA: Diagnosis not present

## 2023-12-26 DIAGNOSIS — I10 Essential (primary) hypertension: Secondary | ICD-10-CM | POA: Diagnosis not present

## 2023-12-26 DIAGNOSIS — I7100 Dissection of unspecified site of aorta: Secondary | ICD-10-CM | POA: Diagnosis not present

## 2023-12-26 DIAGNOSIS — I169 Hypertensive crisis, unspecified: Secondary | ICD-10-CM

## 2023-12-26 DIAGNOSIS — J690 Pneumonitis due to inhalation of food and vomit: Secondary | ICD-10-CM | POA: Diagnosis not present

## 2023-12-26 HISTORY — PX: ESOPHAGOGASTRODUODENOSCOPY: SHX5428

## 2023-12-26 LAB — CBC
HCT: 24.8 % — ABNORMAL LOW (ref 39.0–52.0)
Hemoglobin: 7.6 g/dL — ABNORMAL LOW (ref 13.0–17.0)
MCH: 28.5 pg (ref 26.0–34.0)
MCHC: 30.6 g/dL (ref 30.0–36.0)
MCV: 92.9 fL (ref 80.0–100.0)
Platelets: 258 K/uL (ref 150–400)
RBC: 2.67 MIL/uL — ABNORMAL LOW (ref 4.22–5.81)
RDW: 16.9 % — ABNORMAL HIGH (ref 11.5–15.5)
WBC: 8.4 K/uL (ref 4.0–10.5)
nRBC: 0 % (ref 0.0–0.2)

## 2023-12-26 LAB — BASIC METABOLIC PANEL WITH GFR
Anion gap: 6 (ref 5–15)
Anion gap: 8 (ref 5–15)
BUN: 15 mg/dL (ref 8–23)
BUN: 21 mg/dL (ref 8–23)
CO2: 19 mmol/L — ABNORMAL LOW (ref 22–32)
CO2: 18 mmol/L — ABNORMAL LOW (ref 22–32)
Calcium: 7.2 mg/dL — ABNORMAL LOW (ref 8.9–10.3)
Calcium: 7.7 mg/dL — ABNORMAL LOW (ref 8.9–10.3)
Chloride: 110 mmol/L (ref 98–111)
Chloride: 115 mmol/L — ABNORMAL HIGH (ref 98–111)
Creatinine, Ser: 1.13 mg/dL (ref 0.61–1.24)
Creatinine, Ser: 1.36 mg/dL — ABNORMAL HIGH (ref 0.61–1.24)
GFR, Estimated: >60 mL/min (ref >60)
GFR, Estimated: 54 mL/min — ABNORMAL LOW (ref >60)
Glucose, Bld: 259 mg/dL — ABNORMAL HIGH (ref 70–99)
Glucose, Bld: 190 mg/dL — ABNORMAL HIGH (ref 70–99)
Potassium: 3.6 mmol/L (ref 3.5–5.1)
Potassium: 4.9 mmol/L (ref 3.5–5.1)
Sodium: 135 mmol/L (ref 135–145)
Sodium: 141 mmol/L (ref 135–145)

## 2023-12-26 LAB — GLUCOSE, CAPILLARY
Glucose-Capillary: 139 mg/dL — ABNORMAL HIGH (ref 70–99)
Glucose-Capillary: 153 mg/dL — ABNORMAL HIGH (ref 70–99)
Glucose-Capillary: 171 mg/dL — ABNORMAL HIGH (ref 70–99)
Glucose-Capillary: 161 mg/dL — ABNORMAL HIGH (ref 70–99)
Glucose-Capillary: 167 mg/dL — ABNORMAL HIGH (ref 70–99)

## 2023-12-26 SURGERY — EGD (ESOPHAGOGASTRODUODENOSCOPY)
Anesthesia: Monitor Anesthesia Care

## 2023-12-26 MED ORDER — TRAVASOL 10 % IV SOLN
INTRAVENOUS | Status: AC
  Filled 2023-12-26: qty 998.4

## 2023-12-26 MED ORDER — PROPOFOL 10 MG/ML IV BOLUS
INTRAVENOUS | Status: DC | PRN
  Administered 2023-12-26: 50 mg via INTRAVENOUS
  Administered 2023-12-26: 150 mg via INTRAVENOUS

## 2023-12-26 MED ORDER — DEXAMETHASONE SODIUM PHOSPHATE 10 MG/ML IJ SOLN
INTRAMUSCULAR | Status: DC | PRN
  Administered 2023-12-26: 10 mg via INTRAVENOUS

## 2023-12-26 MED ORDER — ONDANSETRON HCL 4 MG/2ML IJ SOLN
INTRAMUSCULAR | Status: DC | PRN
  Administered 2023-12-26: 4 mg via INTRAVENOUS

## 2023-12-26 MED ORDER — LABETALOL HCL 5 MG/ML IV SOLN
5.0000 mg | INTRAVENOUS | Status: AC | PRN
  Administered 2023-12-26: 5 mg via INTRAVENOUS
  Administered 2023-12-27 (×2): 10 mg via INTRAVENOUS
  Administered 2023-12-27: 5 mg via INTRAVENOUS
  Administered 2023-12-28 – 2023-12-29 (×5): 10 mg via INTRAVENOUS
  Filled 2023-12-26 (×5): qty 4

## 2023-12-26 MED ORDER — SODIUM CHLORIDE 0.9 % IV SOLN
INTRAVENOUS | Status: AC | PRN
  Administered 2023-12-26: 500 mL via INTRAVENOUS

## 2023-12-26 MED ORDER — EPHEDRINE SULFATE (PRESSORS) 50 MG/ML IJ SOLN
INTRAMUSCULAR | Status: DC | PRN
Start: 2023-12-26 — End: 2023-12-26
  Administered 2023-12-26 (×2): 5 mg via INTRAVENOUS

## 2023-12-26 MED ORDER — HYDRALAZINE HCL 25 MG PO TABS
25.0000 mg | ORAL_TABLET | Freq: Three times a day (TID) | ORAL | Status: DC
  Administered 2023-12-26 – 2023-12-27 (×3): 25 mg via ORAL
  Filled 2023-12-26 (×3): qty 1

## 2023-12-26 MED ORDER — HYDRALAZINE HCL 20 MG/ML IJ SOLN
10.0000 mg | INTRAMUSCULAR | Status: DC | PRN
  Administered 2023-12-27 – 2023-12-31 (×10): 10 mg via INTRAVENOUS
  Filled 2023-12-26 (×9): qty 1

## 2023-12-26 MED ORDER — LIDOCAINE 2% (20 MG/ML) 5 ML SYRINGE
INTRAMUSCULAR | Status: DC | PRN
  Administered 2023-12-26: 100 mg via INTRAVENOUS

## 2023-12-26 MED ORDER — PANTOPRAZOLE SODIUM 40 MG PO TBEC
40.0000 mg | DELAYED_RELEASE_TABLET | Freq: Two times a day (BID) | ORAL | Status: DC
  Administered 2023-12-27 – 2024-01-01 (×11): 40 mg via ORAL
  Filled 2023-12-26 (×11): qty 1

## 2023-12-26 MED ORDER — ALBUTEROL SULFATE HFA 108 (90 BASE) MCG/ACT IN AERS
INHALATION_SPRAY | RESPIRATORY_TRACT | Status: DC | PRN
  Administered 2023-12-26 (×2): 2 via RESPIRATORY_TRACT

## 2023-12-26 MED ORDER — CARVEDILOL 25 MG PO TABS
25.0000 mg | ORAL_TABLET | Freq: Two times a day (BID) | ORAL | Status: DC
  Administered 2023-12-26 – 2024-01-01 (×12): 25 mg via ORAL
  Filled 2023-12-26 (×12): qty 1

## 2023-12-26 MED ORDER — FENTANYL CITRATE (PF) 100 MCG/2ML IJ SOLN
INTRAMUSCULAR | Status: AC
  Filled 2023-12-26: qty 2

## 2023-12-26 MED ORDER — ESMOLOL HCL 100 MG/10ML IV SOLN
INTRAVENOUS | Status: DC | PRN
  Administered 2023-12-26: 40 mg via INTRAVENOUS

## 2023-12-26 MED ORDER — SUCCINYLCHOLINE CHLORIDE 200 MG/10ML IV SOSY
PREFILLED_SYRINGE | INTRAVENOUS | Status: DC | PRN
  Administered 2023-12-26: 100 mg via INTRAVENOUS

## 2023-12-26 MED ORDER — FUROSEMIDE 10 MG/ML IJ SOLN
60.0000 mg | Freq: Two times a day (BID) | INTRAMUSCULAR | Status: AC
  Administered 2023-12-26 (×2): 60 mg via INTRAVENOUS
  Filled 2023-12-26 (×2): qty 6

## 2023-12-26 MED ORDER — PROPOFOL 500 MG/50ML IV EMUL
INTRAVENOUS | Status: DC | PRN
  Administered 2023-12-26: 150 ug/kg/min via INTRAVENOUS

## 2023-12-26 MED ORDER — POTASSIUM CHLORIDE 10 MEQ/50ML IV SOLN
10.0000 meq | INTRAVENOUS | Status: AC
  Administered 2023-12-26 (×3): 10 meq via INTRAVENOUS
  Filled 2023-12-26: qty 50

## 2023-12-26 MED ORDER — DEXMEDETOMIDINE HCL IN NACL 80 MCG/20ML IV SOLN
INTRAVENOUS | Status: DC | PRN
  Administered 2023-12-26: 12 ug via INTRAVENOUS

## 2023-12-26 MED ORDER — HYDRALAZINE HCL 50 MG PO TABS: 50.0000 mg | ORAL_TABLET | Freq: Three times a day (TID) | ORAL | Status: DC

## 2023-12-26 MED ORDER — PHENYLEPHRINE 80 MCG/ML (10ML) SYRINGE FOR IV PUSH (FOR BLOOD PRESSURE SUPPORT)
PREFILLED_SYRINGE | INTRAVENOUS | Status: DC | PRN
  Administered 2023-12-26 (×6): 40 ug via INTRAVENOUS

## 2023-12-26 NOTE — Op Note (Signed)
 [facility] Patient Name: Nicholas Burke Procedure Date : 12/26/2023 MRN: [id number] Attending MD: Estelita Manas , MD, [id number] Date of Birth: [birth year] CSN: [id number] Age: 78 Admit Type: Inpatient Procedure:                Upper GI endoscopy Indications:              Recent gastrointestinal bleeding, Blood clots noted                            in cardia and fundus on EGD from 12/20/23 Providers:                Estelita Manas, MD, Clotilda Schmitz, RN, [facility],                            Technician Referring MD:             ICU Team Medicines:                Monitored Anesthesia Care Complications:            No immediate complications. Estimated blood loss:                            Minimal. Estimated Blood Loss:     Estimated blood loss was minimal. Procedure:                Pre-Anesthesia Assessment:                           - Prior to the procedure, a History and Physical                            was performed, and patient medications and                            allergies were reviewed. The patient's tolerance of                            previous anesthesia was also reviewed. The risks                            and benefits of the procedure and the sedation                            options and risks were discussed with the patient.                            All questions were answered, and informed consent                            was obtained. Prior Anticoagulants: The patient has                            taken no anticoagulant or antiplatelet agents. ASA  Grade Assessment: IV - A patient with severe                            systemic disease that is a constant threat to life.                            After reviewing the risks and benefits, the patient                            was deemed in satisfactory condition to undergo the                            procedure.                           After obtaining informed  consent, the endoscope was                            passed under direct vision. Throughout the                            procedure, the patient's blood pressure, pulse, and                            oxygen saturations were monitored continuously. The                            [id number]-H190 ([id number]) Olympus endoscope was introduced                            through the mouth, and advanced to the second part                            of duodenum. The upper GI endoscopy was                            accomplished without difficulty. The patient                            tolerated the procedure well. Scope In: Scope Out: Findings:      The examined esophagus was normal.      The Z-line was regular and was found 40 cm from the incisors.      Many non-bleeding cratered and superficial gastric ulcers with most with       a clean ulcer base (Forrest Class III) and some with pigmentation were       found in the cardia, in the gastric fundus and on the lesser curvature       of the stomach. The largest lesion was 8 mm in largest dimension.       Biopsies were taken with a cold forceps for Helicobacter pylori testing.      Patchy moderately erythematous mucosa without bleeding was found in the       gastric body and in the gastric antrum. Biopsies were taken with a cold  forceps for Helicobacter pylori testing.      The examined duodenum was normal. Impression:               - Normal esophagus.                           - Z-line regular, 40 cm from the incisors.                           - Non-bleeding gastric ulcers with a clean ulcer                            base (Forrest Class III) and pigmentation. Biopsied.                           - Erythematous mucosa in the gastric body and                            antrum. Biopsied.                           - Normal examined duodenum. Moderate Sedation:      Patient did not receive moderate sedation for this procedure, but       instead  received monitored anesthesia care. Recommendation:           - Resume regular diet.                           - Continue present medications. PPI BID for 2                            months.                           - Await pathology results. Procedure Code(s):        --- Professional ---                           [phone], Esophagogastroduodenoscopy, flexible,                            transoral; with biopsy, single or multiple Diagnosis Code(s):        --- Professional ---                           K25.9, Gastric ulcer, unspecified as acute or                            chronic, without hemorrhage or perforation                           K31.89, Other diseases of stomach and duodenum                           K92.2, Gastrointestinal hemorrhage, unspecified CPT copyright 2022 American [employer]. All rights reserved. The codes documented in this report are preliminary and upon coder review may  be revised to meet current compliance requirements. Estelita Manas, MD 12/26/2023 8:26:50 AM This report has been signed electronically. Number of Addenda: 0

## 2023-12-26 NOTE — Progress Notes (Signed)
 NAME:  Nicholas Burke, MRN:  [id number], DOB:  [birth year], LOS: 20 ADMISSION DATE:  12/06/2023, CONSULTATION DATE:  12/06/2023 REFERRING MD: Dr. Celinda - TRH, CHIEF COMPLAINT: Intramural hematoma  History of Present Illness:  Nicholas Burke is a 78 year old male with a past medical history significant for prediabetes, hypertension, and prostate cancer who presented to the ED at [facility] 8/11 for complaints of epigastric pain.  On ED arrival patient was seen hypertensive with all other vital signs within normal limit.  CT abdomen pelvis obtained and revealed extensive intramural hematoma involving the descending thoracic aorta extending throughout the abdominal aorta and into the right external iliac artery.  CTA chest again confirms intramural hematoma beginning at the transverse aorta origin of the left subclavian artery and extending throughout the thoracic aorta and into the abdominal aorta.  Lab work significant for potassium 2.9.  Vascular surgery evaluated patient in ED and recommended tight blood pressure and heart rate control, PCCM consulted in this setting.   Pertinent  Medical History  prediabetes, hypertension, and prostate cancer  Significant Hospital Events: Including procedures, antibiotic start and stop dates in addition to other pertinent events   8/11 presented with epigastric abdominal pain found to have large intramural hematoma 8/14 remains on esmolol , no indication for urgent surgical intervention 8/15 ongoing issues with ileus 8/16 NGT placed to decompress 8/19 continued high output from NGT. Still on Cleviprex  and esmolol . Seen By GI in consult. Stopped CCB due to risk of constipation. Started TPN, PICC placed. 8/20 new tremor stopped the reglan . Still trying to get control of BP and HR. Started scheduled IV hydral + esmolol . 8/21 Patient became hypotensive, clonidine  patch was removed, hydralazine , esmolol  were stopped 8/22 on low-dose labetalol  infusion 8/23 had multiple small  bowel movements, denies abdominal pain, NG output was 550 cc 8/24 BMs  8/25 hgb drop 5.3, 2 PRBC. AKI  8/25 EGD - Normal larynx. - Tiny hiatal hernia. - Clotted blood in the gastric fundus and in the proximal gastric body. - Normal duodenal bulb, first portion of the duodenum, second portion of the duodenum 8/25 CTA chest/abd/pelvis>>1. No significant change in the previously demonstrated intramural hematoma extending from the origin of the left subclavian artery into the abdomen and right common and external iliac arteries. 2. Interval increase in aneurysmal dilatation of the proximal descending thoracic aorta, measuring 4.9 cm in diameter. 3. Interval small amount of patchy density in the posterior right upper lobe, compatible with pneumonia. 4. Interval small right pleural effusion and increased size of the previously demonstrated small left pleural effusion, currently small to moderate in size.  5. Interval dilatation of the stomach and mild dilatation of the proximal and mid small bowel loops with some low-density wall thickening in the pelvis. This is compatible with enteritis with partial small bowel obstruction or gastric and small-bowel ileus. 6. No evidence of active gastrointestinal bleeding. 7. Large right inguinal hernia containing fat and fluid, moderate-sized left inguinal hernia containing fat and small umbilical hernia containing fat. 8.  Calcific coronary artery and aortic atherosclerosis. 8/29 adding PO antiHTN   Interim History / Subjective:  Breathing a little more labored today Just returned from EGD: multiple ulcerations  Objective    Blood pressure 109/67, pulse 77, temperature 97.8 F (36.6 C), temperature source Oral, resp. rate (!) 26, height 5' 10 (1.778 m), weight 100.9 kg, SpO2 96%. CVP:  [11 mmHg-17 mmHg] 17 mmHg      Intake/Output Summary (Last 24 hours) at 12/26/2023 0957 Last data  filed at 12/26/2023 0900 Gross per 24 hour  Intake 3057.19 ml  Output 850 ml  Net  2207.19 ml   Filed Weights   12/24/23 0545 12/25/23 0335 12/26/23 0352  Weight: 101.1 kg 98.9 kg 100.9 kg    Physical exam No distress A little more labored breathing Abd protuberant but soft, +BS RASS -1 after anesthesia Ext warm, trace edema  Labs/imaging/EGD reviewed  Resolved    Lactic acidosis on 8/17 Hypernatremia Hypokalemia HTN emergency  Aspiration PNA RLL  Assessment and Plan    Type B dissection, intramural hematoma Hypertensive crisis- keeps needing at bedtime labetelol gtt restarts Postop ileus- improved, some lingering gastroparesis PUD- thought 2/2 NGT suction trauma, no further GIB, remains on PPI; appreciate GI help Postop deconditioning- he is working on this Volume overloaded state of heart- 20 lbs up from admit weight  - DC labetalol  drip - Increase coreg  - PRN IVP for SBP > 150 - Continue 48h erythromycin  - Continue to push mobility - Start lasix , may help with gut as well - TPN until taking in enough calories - OP VVS f/u  33 min cc time Rolan Sharps MD PCCM

## 2023-12-26 NOTE — Anesthesia Preprocedure Evaluation (Addendum)
 Anesthesia Evaluation  Patient identified by MRN, date of birth, ID band Patient awake    Reviewed: Allergy & Precautions, H&P , NPO status , Patient's Chart, lab work & pertinent test results  History of Anesthesia Complications Negative for: history of anesthetic complications  Airway Mallampati: II  TM Distance: >3 FB Neck ROM: Full    Dental no notable dental hx.    Pulmonary neg pulmonary ROS   Pulmonary exam normal breath sounds clear to auscultation       Cardiovascular hypertension, (-) Past MI Normal cardiovascular exam Rhythm:Regular Rate:Normal  Type B aortic dissection. Currently being medically managed.   Severe HTN   Neuro/Psych negative neurological ROS  negative psych ROS   GI/Hepatic Neg liver ROS,,,Upper GI bleed  TPN   Endo/Other  diabetes    Renal/GU negative Renal ROS   Hx of prostate cancer    Musculoskeletal negative musculoskeletal ROS (+)    Abdominal   Peds negative pediatric ROS (+)  Hematology Hg 7.6   Anesthesia Other Findings   Reproductive/Obstetrics negative OB ROS                              Anesthesia Physical Anesthesia Plan  ASA: 4  Anesthesia Plan: MAC   Post-op Pain Management:    Induction: Intravenous  PONV Risk Score and Plan: 1 and Propofol  infusion and Treatment may vary due to age or medical condition  Airway Management Planned: Natural Airway  Additional Equipment:   Intra-op Plan:   Post-operative Plan:   Informed Consent: I have reviewed the patients History and Physical, chart, labs and discussed the procedure including the risks, benefits and alternatives for the proposed anesthesia with the patient or authorized representative who has indicated his/her understanding and acceptance.     Dental advisory given  Plan Discussed with: CRNA  Anesthesia Plan Comments:          Anesthesia Quick Evaluation

## 2023-12-26 NOTE — Interval H&P Note (Signed)
 History and Physical Interval Note: 77/male with EGD from 12/20/2023 showed clotted blood in gastric body and gastric fundus, for repeat EGD to assess gastric cardia and fundus, with propofol .  12/26/2023 7:35 AM  Nicholas Burke  has presented today for EGD with propofol , with the diagnosis of Blood in gastric cardia and fundus on EGD from 12/20/2023.  The various methods of treatment have been discussed with the patient and family. After consideration of risks, benefits and other options for treatment, the patient has consented to  Procedure(s): EGD (ESOPHAGOGASTRODUODENOSCOPY) (N/A) as a surgical intervention.  The patient's history has been reviewed, patient examined, no change in status, stable for surgery.  I have reviewed the patient's chart and labs.  Questions were answered to the patient's satisfaction.     Estelita Manas

## 2023-12-26 NOTE — Anesthesia Postprocedure Evaluation (Signed)
 Anesthesia Post Note  Patient: Nicholas Burke  Procedure(s) Performed: EGD (ESOPHAGOGASTRODUODENOSCOPY)     Patient location during evaluation: PACU Anesthesia Type: General Level of consciousness: awake and alert Pain management: pain level controlled Vital Signs Assessment: post-procedure vital signs reviewed and stable Respiratory status: spontaneous breathing, nonlabored ventilation, respiratory function stable and patient connected to nasal cannula oxygen Cardiovascular status: blood pressure returned to baseline and stable Postop Assessment: no apparent nausea or vomiting Anesthetic complications: no   No notable events documented.  Last Vitals:  Vitals:   12/26/23 0850 12/26/23 0900  BP: (!) 96/54 95/64  Pulse: 81 78  Resp: (!) 22 (!) 28  Temp:    SpO2: 95% 94%    Last Pain:  Vitals:   12/26/23 0850  TempSrc:   PainSc: 0-No pain                 Thom JONELLE Peoples

## 2023-12-26 NOTE — Transfer of Care (Signed)
 Immediate Anesthesia Transfer of Care Note  Patient: Nicholas Burke  Procedure(s) Performed: EGD (ESOPHAGOGASTRODUODENOSCOPY)  Patient Location: PACU  Anesthesia Type:General  Level of Consciousness: awake, alert , oriented, and lethargic  Airway & Oxygen Therapy: Patient Spontanous Breathing and Patient connected to face mask oxygen  Post-op Assessment: Report given to RN, Post -op Vital signs reviewed and stable, and Patient moving all extremities X 4  Post vital signs: Reviewed and stable  Last Vitals:  Vitals Value Taken Time  BP    Temp    Pulse 80 12/26/23 08:42  Resp 32 12/26/23 08:42  SpO2 96 % 12/26/23 08:42  Vitals shown include unfiled device data.  Last Pain:  Vitals:   12/26/23 0710  TempSrc: Temporal  PainSc: 4       Patients Stated Pain Goal: 0 (12/26/23 0400)  Complications: No notable events documented.

## 2023-12-26 NOTE — Anesthesia Procedure Notes (Signed)
 Procedure Name: Intubation Date/Time: 12/26/2023 7:58 AM  Performed by: Mollie Olivia SAUNDERS, CRNAPre-anesthesia Checklist: Patient identified, Emergency Drugs available, Suction available and Patient being monitored Patient Re-evaluated:Patient Re-evaluated prior to induction Oxygen Delivery Method: Circle system utilized Preoxygenation: Pre-oxygenation with 100% oxygen Induction Type: IV induction Laryngoscope Size: Glidescope and 4 Grade View: Grade I Tube type: Oral Tube size: 7.5 mm Number of attempts: 1 Airway Equipment and Method: Oral airway, Rigid stylet and Video-laryngoscopy Placement Confirmation: ETT inserted through vocal cords under direct vision, positive ETCO2 and breath sounds checked- equal and bilateral Secured at: 23 cm Tube secured with: Tape Dental Injury: Teeth and Oropharynx as per pre-operative assessment  Difficulty Due To: Difficulty was anticipated Comments: Large tight abdomen ; difficulty breathing ; tachypneic

## 2023-12-26 NOTE — Progress Notes (Addendum)
 PHARMACY - TOTAL PARENTERAL NUTRITION CONSULT NOTE   Indication: Prolonged ileus  Patient Measurements: Height: 5' 10 (177.8 cm) Weight: 100.9 kg (222 lb 7.1 oz) IBW/kg (Calculated) : 73 TPN AdjBW (KG): 90.7 Body mass index is 31.92 kg/m. Usual Weight: 91.2 kg   Assessment:  Patient is a 78 yo M admitted with aortic dissection with intramural hematoma. Patient developed severe ileus secondary to dissection. Patient was active and had good nutrition PTA. Without nutrition for 7 days at the start of TPN. Patient at moderate risk of refeeding.   8/27 Patient is passing minimal gas. NG tube is removed and patient has not had significant N/V or distention per GI.   Glucose / Insulin : A1c 7.1, CBGs 101-143 with 14 units inTPN +  SSI (5u in 24h) Electrolytes: Na 135; K 3.6  (goal 4-4.5 per primary), Ca 7.2  [CoCa 9.04], Cl 110, CO2 19 Renal: Scr 1.13 (last lasix  8/27), BUN 15 Hepatic: AST/ALT wnl, T bili 0.7, Alb 1.7 Intake / Output; MIVF: UOP 0.35 ml/kg/hr + 7 unmeasured, Stool x5  GI Imaging:  8/11 CT abd- Extensive intramural hematoma involving the descending thoracic aorta extending throughout the abdominal aorta and into the right inflow vessels terminating in the mid right external iliac artery. 8/14 DG abd- There is increased amount of gas throughout the small bowel and colon, likely manifestation of adynamic ileus 8/17 DG abd- Persistent but improved gaseous gastric distension  8/19 DG abd- unchanged 8/24 DG abd: Diffuse air distension of the small bowel relatively similar to prior radiograph  GI Surgeries / Procedures:  8/25 EGD - minimal NG tube trauma, old clotted blood was found in the gastric fundus 8/28 plan for repeat EGD 8/31 EGD  Central access: PICC  TPN start date: 8/19  Nutritional Goals: Goal TPN rate is 80 mL/hr (provides 100 g of protein and 1994 kcals per day)  RD Assessment: Estimated Needs Total Energy Estimated Needs: 1900-2100 kcals Total Protein  Estimated Needs: 95-105 g Total Fluid Estimated Needs: >/= 2L (not taking into account high NG output)  Current Nutrition:  Clear liquid diet TPN  Plan: Continue TPN at 80 mL/hr which provides 100% of estimated daily caloric needs  Electrolytes in TPN: Na to 30 mEq/L, K 40 mEq/L, Ca 3 mEq/L, Mg 9 mEq/L, Phos 13 mmol/L.  Cl:Ac adjusted to 1:2 [chloride is moving downward] Add standard MVI and trace elements to TPN Sensitive SSI q4h and adjust as needed  Insulin  regular to 14 units in TPN K 10 meq iv x4 runs Lasix  60 mg iv q12h x2 doses ordered ~10:45- f/up E-lyte effects 9/1 Monitor TPN labs on Mon/Thurs Monitor diet advancement as tolerated  Thank you for allowing pharmacy to be a part of this patient's care.   Benedetta Heath BS, PharmD, BCPS Clinical Pharmacist 12/26/2023 7:18 AM  Contact: [phone] after 3 PM

## 2023-12-27 DIAGNOSIS — I7103 Dissection of thoracoabdominal aorta: Secondary | ICD-10-CM | POA: Diagnosis not present

## 2023-12-27 DIAGNOSIS — J690 Pneumonitis due to inhalation of food and vomit: Secondary | ICD-10-CM | POA: Diagnosis not present

## 2023-12-27 DIAGNOSIS — J9601 Acute respiratory failure with hypoxia: Secondary | ICD-10-CM | POA: Diagnosis not present

## 2023-12-27 DIAGNOSIS — I10 Essential (primary) hypertension: Secondary | ICD-10-CM | POA: Diagnosis not present

## 2023-12-27 LAB — COMPREHENSIVE METABOLIC PANEL WITH GFR
ALT: 38 U/L (ref 0–44)
AST: 34 U/L (ref 15–41)
Albumin: 2.1 g/dL — ABNORMAL LOW (ref 3.5–5.0)
Alkaline Phosphatase: 101 U/L (ref 38–126)
Anion gap: 9 (ref 5–15)
BUN: 24 mg/dL — ABNORMAL HIGH (ref 8–23)
CO2: 22 mmol/L (ref 22–32)
Calcium: 8 mg/dL — ABNORMAL LOW (ref 8.9–10.3)
Chloride: 111 mmol/L (ref 98–111)
Creatinine, Ser: 1.44 mg/dL — ABNORMAL HIGH (ref 0.61–1.24)
GFR, Estimated: 50 mL/min — ABNORMAL LOW (ref >60)
Glucose, Bld: 178 mg/dL — ABNORMAL HIGH (ref 70–99)
Potassium: 4.6 mmol/L (ref 3.5–5.1)
Sodium: 142 mmol/L (ref 135–145)
Total Bilirubin: 0.6 mg/dL (ref 0.0–1.2)
Total Protein: 6.2 g/dL — ABNORMAL LOW (ref 6.5–8.1)

## 2023-12-27 LAB — CBC
HCT: 25.8 % — ABNORMAL LOW (ref 39.0–52.0)
Hemoglobin: 8 g/dL — ABNORMAL LOW (ref 13.0–17.0)
MCH: 28.5 pg (ref 26.0–34.0)
MCHC: 31 g/dL (ref 30.0–36.0)
MCV: 91.8 fL (ref 80.0–100.0)
Platelets: 279 K/uL (ref 150–400)
RBC: 2.81 MIL/uL — ABNORMAL LOW (ref 4.22–5.81)
RDW: 16.9 % — ABNORMAL HIGH (ref 11.5–15.5)
WBC: 10.2 K/uL (ref 4.0–10.5)
nRBC: 0 % (ref 0.0–0.2)

## 2023-12-27 LAB — MAGNESIUM: Magnesium: 2.2 mg/dL (ref 1.7–2.4)

## 2023-12-27 LAB — GLUCOSE, CAPILLARY
Glucose-Capillary: 145 mg/dL — ABNORMAL HIGH (ref 70–99)
Glucose-Capillary: 147 mg/dL — ABNORMAL HIGH (ref 70–99)
Glucose-Capillary: 153 mg/dL — ABNORMAL HIGH (ref 70–99)
Glucose-Capillary: 162 mg/dL — ABNORMAL HIGH (ref 70–99)
Glucose-Capillary: 164 mg/dL — ABNORMAL HIGH (ref 70–99)

## 2023-12-27 LAB — TRIGLYCERIDES: Triglycerides: 53 mg/dL (ref <150)

## 2023-12-27 LAB — PHOSPHORUS: Phosphorus: 3.5 mg/dL (ref 2.5–4.6)

## 2023-12-27 MED ORDER — ADULT MULTIVITAMIN W/MINERALS CH
1.0000 | ORAL_TABLET | Freq: Every day | ORAL | Status: DC
  Administered 2023-12-27 – 2024-01-01 (×6): 1 via ORAL
  Filled 2023-12-27 (×6): qty 1

## 2023-12-27 MED ORDER — ENSURE PLUS HIGH PROTEIN PO LIQD
237.0000 mL | Freq: Two times a day (BID) | ORAL | Status: DC
  Administered 2023-12-27 – 2023-12-31 (×9): 237 mL via ORAL

## 2023-12-27 MED ORDER — INSULIN ASPART 100 UNIT/ML IJ SOLN
0.0000 [IU] | Freq: Three times a day (TID) | INTRAMUSCULAR | Status: DC
  Administered 2023-12-27: 2 [IU] via SUBCUTANEOUS
  Administered 2023-12-27: 1 [IU] via SUBCUTANEOUS

## 2023-12-27 MED ORDER — TRAVASOL 10 % IV SOLN
INTRAVENOUS | Status: AC
  Filled 2023-12-27: qty 1026

## 2023-12-27 MED ORDER — TRAVASOL 10 % IV SOLN: INTRAVENOUS | Status: DC

## 2023-12-27 MED ORDER — CLONAZEPAM 0.5 MG PO TABS
0.5000 mg | ORAL_TABLET | Freq: Once | ORAL | Status: AC
  Administered 2023-12-27: 0.5 mg via ORAL
  Filled 2023-12-27: qty 1

## 2023-12-27 MED ORDER — HEPARIN SODIUM (PORCINE) 5000 UNIT/ML IJ SOLN
5000.0000 [IU] | Freq: Three times a day (TID) | INTRAMUSCULAR | Status: DC
  Administered 2023-12-27 – 2024-01-01 (×15): 5000 [IU] via SUBCUTANEOUS
  Filled 2023-12-27 (×15): qty 1

## 2023-12-27 MED ORDER — HYDRALAZINE HCL 50 MG PO TABS
50.0000 mg | ORAL_TABLET | Freq: Three times a day (TID) | ORAL | Status: DC
  Administered 2023-12-27 – 2023-12-28 (×3): 50 mg via ORAL
  Filled 2023-12-27 (×3): qty 1

## 2023-12-27 NOTE — Progress Notes (Addendum)
 NAME:  Nicholas Burke, MRN:  [id number], DOB:  11/22/1945, LOS: 21 ADMISSION DATE:  12/06/2023, CONSULTATION DATE:  12/06/2023 REFERRING MD: Dr. Celinda - [facility], CHIEF COMPLAINT: Intramural hematoma  History of Present Illness:  Nicholas Burke is a 78 year old male with a past medical history significant for prediabetes, hypertension, and prostate cancer who presented to the ED at [facility] 8/11 for complaints of epigastric pain.  On ED arrival patient was seen hypertensive with all other vital signs within normal limit.  CT abdomen pelvis obtained and revealed extensive intramural hematoma involving the descending thoracic aorta extending throughout the abdominal aorta and into the right external iliac artery.  CTA chest again confirms intramural hematoma beginning at the transverse aorta origin of the left subclavian artery and extending throughout the thoracic aorta and into the abdominal aorta.  Lab work significant for potassium 2.9.  Vascular surgery evaluated patient in ED and recommended tight blood pressure and heart rate control, PCCM consulted in this setting.   Pertinent  Medical History  prediabetes, hypertension, and prostate cancer  Significant Hospital Events: Including procedures, antibiotic start and stop dates in addition to other pertinent events   8/11 presented with epigastric abdominal pain found to have large intramural hematoma 8/14 remains on esmolol , no indication for urgent surgical intervention 8/15 ongoing issues with ileus 8/16 NGT placed to decompress 8/19 continued high output from NGT. Still on Cleviprex  and esmolol . Seen By GI in consult. Stopped CCB due to risk of constipation. Started TPN, PICC placed. 8/20 new tremor stopped the reglan . Still trying to get control of BP and HR. Started scheduled IV hydral + esmolol . 8/21 Patient became hypotensive, clonidine  patch was removed, hydralazine , esmolol  were stopped 8/22 on low-dose labetalol  infusion 8/23 had multiple small  bowel movements, denies abdominal pain, NG output was 550 cc 8/24 BMs  8/25 hgb drop 5.3, 2 PRBC. AKI  8/25 EGD - Normal larynx. - Tiny hiatal hernia. - Clotted blood in the gastric fundus and in the proximal gastric body. - Normal duodenal bulb, first portion of the duodenum, second portion of the duodenum 8/25 CTA chest/abd/pelvis>>1. No significant change in the previously demonstrated intramural hematoma extending from the origin of the left subclavian artery into the abdomen and right common and external iliac arteries. 2. Interval increase in aneurysmal dilatation of the proximal descending thoracic aorta, measuring 4.9 cm in diameter. 3. Interval small amount of patchy density in the posterior right upper lobe, compatible with pneumonia. 4. Interval small right pleural effusion and increased size of the previously demonstrated small left pleural effusion, currently small to moderate in size.  5. Interval dilatation of the stomach and mild dilatation of the proximal and mid small bowel loops with some low-density wall thickening in the pelvis. This is compatible with enteritis with partial small bowel obstruction or gastric and small-bowel ileus. 6. No evidence of active gastrointestinal bleeding. 7. Large right inguinal hernia containing fat and fluid, moderate-sized left inguinal hernia containing fat and small umbilical hernia containing fat. 8.  Calcific coronary artery and aortic atherosclerosis. 8/29 p.o. antihypertensive added 8/31 repeat EGD showing multiple stomach ulcers, nonbleeding  Interim History / Subjective:  No overnight issues Remained afebrile Tolerating diet, had 1 bowel movement this morning  Objective    Blood pressure 139/80, pulse 88, temperature 98 F (36.7 C), temperature source Oral, resp. rate (!) 21, height 5' 10 (1.778 m), weight 98.1 kg, SpO2 91%. CVP:  [13 mmHg-15 mmHg] 15 mmHg      Intake/Output Summary (  Last 24 hours) at 12/27/2023 1007 Last data filed at  12/27/2023 0800 Gross per 24 hour  Intake 1784.08 ml  Output 3425 ml  Net -1640.92 ml   Filed Weights   12/25/23 0335 12/26/23 0352 12/27/23 0645  Weight: 98.9 kg 100.9 kg 98.1 kg    Physical exam General: Elderly male, sitting on recliner HEENT: [place]/AT, eyes anicteric.  moist mucus membranes Neuro: Alert, awake following commands Chest: Coarse breath sounds, no wheezes or rhonchi Heart: Regular rate and rhythm, no murmurs or gallops Abdomen: Soft, nontender, distended, bowel sounds present  Labs and images reviewed  Patient Lines/Drains/Airways Status     Active Line/Drains/Airways     Name Placement date Placement time Site Days   PICC Triple Lumen 12/14/23 Left Basilic 48 cm 0 cm 12/14/23  8242  -- 13        Resolved   Lactic acidosis on 8/17 Hypernatremia Hypokalemia HTN emergency  Acute respiratory failure with hypoxia due to aspiration pneumonia Assessment and Plan  Type B aortic dissection with intramural hematoma  Hypertension, uncontrolled Continue conservative management by controlling blood pressure with SBP goal less than 140 and heart rate goal less than 80 Remain off IV antihypertensive infusions Continue Coreg  25 mg twice daily Increase hydralazine  to 50 mg 3 times daily Repeat CTA showing a stable type B aortic dissection  Acute blood loss anemia Acute upper GI bleeding due to peptic ulcer disease Blood in stomach but no obvious source  on EGD 8/25 Repeat EGD on 8/31 showing multiple stomach ulcers, no active bleeding H&H remained stable Continue Protonix  twice daily   Postop refractory Ileus Refeeding syndrome, on TPN Moderate malnutrition Continue erythromycin  to complete 48 hours therapy to stimulate gut motility Ileus has resolved Patient is able to tolerate oral diet Continue TPN for now Avoid opiates Encourage ambulation   AKI due to aggressive diuresis Creatinine trended up to 1.4 Hold off on further diuretics Monitor intake and  output Avoid nephrotoxic agent  DM2 Blood sugars are controlled Continue to monitor fingerstick with goal 140-180 Continue sliding scale insulin   Hx prostate cancer; in remission; had local radiation treatment Outpatient follow-up with oncology as previously scheduled    Valinda Novas, MD [facility] See Amion for pager If no response to pager, please call [phone] until 7pm After 7pm, Please call E-link [phone]

## 2023-12-27 NOTE — Plan of Care (Signed)
   Problem: Education: Goal: Ability to describe self-care measures that may prevent or decrease complications (Diabetes Survival Skills Education) will improve Outcome: Progressing Goal: Individualized Educational Video(s) Outcome: Progressing   Problem: Coping: Goal: Ability to adjust to condition or change in health will improve Outcome: Progressing

## 2023-12-27 NOTE — Progress Notes (Signed)
 CVP tubing / monitoring brought with patient from 2H. No CVP orders.  Per Harold, MD, CVP monitoring is no longer needed.  Charlet Harr L Travontae Freiberger, RN

## 2023-12-27 NOTE — Progress Notes (Signed)
 Physical Therapy Treatment Patient Details Name: Nicholas Burke MRN: [id number] DOB: [birth year] Today's Date: 12/27/2023   History of Present Illness Pt is 78 year old presented to [facility] on  12/06/23 for Type B aortic dissection with intramural hematoma. Developed severe ileus due to dissection. Developed acute respiratory failure with hypoxia suspected due to aspiration from nonfunctioning NGT. Developed GIB. PMH - htn, prostate CA    PT Comments  Good progress today - ambulating 500 feet with light support from RW. No overt LOB or buckling, stability seems improved from prior visits. Pre-activity BP 139/80; post activity BP 159/99 (RN aware, rechecking after pt sits down for a while after ambulating. SpO2 93% HR in 90s while ambulating. Denies CP or SOB with activity. Brief helpful for ambulating due pt prior episodes of bowel incontinence when working with rehab team. Patient will continue to benefit from skilled physical therapy services to further improve independence with functional mobility.     If plan is discharge home, recommend the following: Assist for transportation;Help with stairs or ramp for entrance;Assistance with cooking/housework;Supervision due to cognitive status   Can travel by private vehicle        Equipment Recommendations  Other (comment) (Possibly Rollator/SPC - pending progress)    Recommendations for Other Services       Precautions / Restrictions Precautions Precautions: Other (comment) Recall of Precautions/Restrictions: Intact Precaution/Restrictions Comments: bowel incontinence - has briefs now Restrictions Weight Bearing Restrictions Per Provider Order: No     Mobility  Bed Mobility               General bed mobility comments: in recliner    Transfers Overall transfer level: Needs assistance Equipment used: Rolling walker (2 wheels) Transfers: Sit to/from Stand Sit to Stand: Supervision           General transfer comment: Good power up  to stand from recliner, did require cues for hand placement.    Ambulation/Gait Ambulation/Gait assistance: Supervision Gait Distance (Feet): 500 Feet Assistive device: Rolling walker (2 wheels) Gait Pattern/deviations: Step-through pattern, Decreased stride length Gait velocity: decr Gait velocity interpretation: <1.8 ft/sec, indicate of risk for recurrent falls   General Gait Details: Improved stability today, supervision for safety with RW for support, with light reliance. Likely appropriate to being some closely guarded gait training without AD as tolerated in future visits. No overt buckling noted. VSS throughout SpO2 93% and greater on RA. HR 90s. Brief in place due to prior bowel incontinence episodes during therapy.   Stairs             Wheelchair Mobility     Tilt Bed    Modified Rankin (Stroke Patients Only)       Balance Overall balance assessment: Mild deficits observed, not formally tested                                          Communication Communication Communication: No apparent difficulties  Cognition Arousal: Alert Behavior During Therapy: WFL for tasks assessed/performed   PT - Cognitive impairments: No apparent impairments                         Following commands: Intact      Cueing Cueing Techniques: Verbal cues  Exercises      General Comments General comments (skin integrity, edema, etc.): BP 139/80 (end of session in recliner  159/99 RN aware). HR 91, SpO2 93-94% on RA.      Pertinent Vitals/Pain Pain Assessment Pain Assessment: No/denies pain    Home Living                          Prior Function            PT Goals (current goals can now be found in the care plan section) Acute Rehab PT Goals Patient Stated Goal: return home PT Goal Formulation: With patient Time For Goal Achievement: 12/29/23 Potential to Achieve Goals: Good Progress towards PT goals: Progressing toward  goals    Frequency    Min 2X/week      PT Plan      Co-evaluation              AM-PAC PT 6 Clicks Mobility   Outcome Measure  Help needed turning from your back to your side while in a flat bed without using bedrails?: None Help needed moving from lying on your back to sitting on the side of a flat bed without using bedrails?: A Little Help needed moving to and from a bed to a chair (including a wheelchair)?: A Little Help needed standing up from a chair using your arms (e.g., wheelchair or bedside chair)?: A Little Help needed to walk in hospital room?: A Little Help needed climbing 3-5 steps with a railing? : A Little 6 Click Score: 19    End of Session   Activity Tolerance: Patient tolerated treatment well Patient left: with call bell/phone within reach;in chair;with chair alarm set;with family/visitor present;with nursing/sitter in room Nurse Communication: Mobility status PT Visit Diagnosis: Other abnormalities of gait and mobility (R26.89);Muscle weakness (generalized) (M62.81);Difficulty in walking, not elsewhere classified (R26.2)     Time: 9046-8992 PT Time Calculation (min) (ACUTE ONLY): 14 min  Charges:    $Gait Training: 8-22 mins PT General Charges $$ ACUTE PT VISIT: 1 Visit                     Leontine Roads, PT, DPT [facility]  Rehabilitation Services Physical Therapist Office: [phone] Website: [email or web address]    Leontine GORMAN Roads 12/27/2023, 11:48 AM

## 2023-12-27 NOTE — Progress Notes (Signed)
 Patient requesting anxiety medication. MD notified.  Lejuan Botto L Classie Weng, RN

## 2023-12-27 NOTE — Progress Notes (Addendum)
 PHARMACY - TOTAL PARENTERAL NUTRITION CONSULT NOTE   Indication: Prolonged ileus  Patient Measurements: Height: 5' 10 (177.8 cm) Weight: 98.1 kg (216 lb 4.3 oz) IBW/kg (Calculated) : 73 TPN AdjBW (KG): 90.7 Body mass index is 31.03 kg/m. Usual Weight: 91.2 kg   Assessment:  Patient is a 78 yo M admitted with aortic dissection with intramural hematoma. Patient developed severe ileus secondary to dissection. Patient was active and had good nutrition PTA. Without nutrition for 7 days and with high output from NGT at the start of TPN. Patient at moderate risk of refeeding.   8/27 Patient is passing minimal gas. NG tube is removed and patient has not had significant N/V or distention per GI.   Glucose / Insulin : A1c 7.1, CBGs 153-190, dexamethasone  x1 8/31, used 16 units insulin  /24 hr, 14 units inTPN  Electrolytes: K 4.6 (received 40 mEq IV, goal 4-4.5 per primary),  CoCa 9.5  Renal: Scr 1.44 up,  BUN 24 up Hepatic: alk phos/AST/ALT/T bili wnl, Alb 2.1, TG 53  Intake / Output; MIVF: furosemide  60mg  IV q12; UOP 1.7 ml/kg/h. LBM 8/31 Stool x6; Net + 23 L (insensible losses not accounted for) 8/31 neostigmine  x1  GI Imaging:  8/11 CT abd- Extensive intramural hematoma involving the descending thoracic aorta extending throughout the abdominal aorta and into the right inflow vessels terminating in the mid right external iliac artery. 8/14 DG abd- There is increased amount of gas throughout the small bowel and colon, likely manifestation of adynamic ileus 8/17 DG abd- Persistent but improved gaseous gastric distension  8/19 DG abd- decrease in gaseous distention of the stomach, persistent distension of small bowel  8/24 DG abd: persistent ileus  8/25 CT abd: enteritis with pSBO or gastric and small bowel ileus   GI Surgeries / Procedures:  8/25 EGD - minimal NG tube trauma, old clotted blood found in gastric fundus  8/31 EGD- non-bleeding gastric ulcers biopsied   Central access: PICC   TPN start date: 8/19  Nutritional Goals: Goal TPN rate is 80 mL/hr (provides 103 g of protein and 1994 kcals per day)  RD Assessment: Estimated Needs Total Energy Estimated Needs: 1900-2100 kcals Total Protein Estimated Needs: 95-105 g Total Fluid Estimated Needs: >/= 2L (not taking into account high NG output)  Current Nutrition:  TPN 8/31 Reg diet- 25% charted 9/1 Reg diet- for breakfast had bites of sausage, scrambled eggs, and a bite of a muffin. Poor appetite per patient, flows right through him  Plan: Cycle TPN over 18 hrs to see if helps with appetite. Run at 53-106 mL/hr ( taper up/down 1 hr, GIR 1.35-2.7 mg/kg/min), provides 100% of estimated caloric needs  Electrolytes in TPN: Na to 30 mEq/L, decrease K 28 mEq/L, Ca 3 mEq/L, decrease Mg 8 mEq/L, Phos 14 mmol/L; Cl:Ac 1:2   Remove standard MVI and trace elements from TPN, give PO tablet Change to sensitive SSI TID with meals  Increase slightly Insulin  regular 15 units in TPN  Monitor TPN labs on Mon/Thurs and PRN Monitor po intake, add ensure, cycle/wean TPN as able   Thank you for allowing pharmacy to be a part of this patient's care.  Jinnie Door, PharmD, BCPS, BCCP Clinical Pharmacist  Please check AMION for all [facility] Pharmacy phone numbers After 10:00 PM, call Main Pharmacy [phone]

## 2023-12-27 NOTE — Progress Notes (Signed)
 Patient brought to 4E from 2H. VSS. Telemetry box applied, CCMD notified. Patient oriented to room and staff. Call bell in reach. Daughter present.  Kree Rafter L Lanita Stammen, RN

## 2023-12-28 ENCOUNTER — Inpatient Hospital Stay (HOSPITAL_COMMUNITY)

## 2023-12-28 ENCOUNTER — Encounter (HOSPITAL_COMMUNITY): Payer: Self-pay | Admitting: Gastroenterology

## 2023-12-28 DIAGNOSIS — I7100 Dissection of unspecified site of aorta: Secondary | ICD-10-CM | POA: Diagnosis not present

## 2023-12-28 LAB — GLUCOSE, CAPILLARY
Glucose-Capillary: 182 mg/dL — ABNORMAL HIGH (ref 70–99)
Glucose-Capillary: 154 mg/dL — ABNORMAL HIGH (ref 70–99)
Glucose-Capillary: 110 mg/dL — ABNORMAL HIGH (ref 70–99)
Glucose-Capillary: 107 mg/dL — ABNORMAL HIGH (ref 70–99)
Glucose-Capillary: 139 mg/dL — ABNORMAL HIGH (ref 70–99)
Glucose-Capillary: 207 mg/dL — ABNORMAL HIGH (ref 70–99)

## 2023-12-28 LAB — RENAL FUNCTION PANEL
Albumin: 2.1 g/dL — ABNORMAL LOW (ref 3.5–5.0)
Anion gap: 9 (ref 5–15)
BUN: 28 mg/dL — ABNORMAL HIGH (ref 8–23)
CO2: 23 mmol/L (ref 22–32)
Calcium: 8.1 mg/dL — ABNORMAL LOW (ref 8.9–10.3)
Chloride: 111 mmol/L (ref 98–111)
Creatinine, Ser: 1.22 mg/dL (ref 0.61–1.24)
GFR, Estimated: >60 mL/min (ref >60)
Glucose, Bld: 150 mg/dL — ABNORMAL HIGH (ref 70–99)
Phosphorus: 3.4 mg/dL (ref 2.5–4.6)
Potassium: 4.3 mmol/L (ref 3.5–5.1)
Sodium: 143 mmol/L (ref 135–145)

## 2023-12-28 LAB — MAGNESIUM: Magnesium: 2.3 mg/dL (ref 1.7–2.4)

## 2023-12-28 MED ORDER — LEVALBUTEROL HCL 1.25 MG/0.5ML IN NEBU
1.2500 mg | INHALATION_SOLUTION | Freq: Four times a day (QID) | RESPIRATORY_TRACT | Status: DC
  Administered 2023-12-29 – 2023-12-30 (×6): 1.25 mg via RESPIRATORY_TRACT
  Filled 2023-12-28 (×10): qty 0.5

## 2023-12-28 MED ORDER — LISINOPRIL 5 MG PO TABS
5.0000 mg | ORAL_TABLET | Freq: Every day | ORAL | Status: DC
  Administered 2023-12-28: 5 mg via ORAL
  Filled 2023-12-28: qty 1

## 2023-12-28 MED ORDER — FINASTERIDE 5 MG PO TABS
5.0000 mg | ORAL_TABLET | Freq: Every day | ORAL | Status: DC
  Administered 2023-12-28 – 2024-01-01 (×5): 5 mg via ORAL
  Filled 2023-12-28 (×5): qty 1

## 2023-12-28 MED ORDER — SODIUM CHLORIDE 0.9 % IV SOLN
1.5000 g | Freq: Four times a day (QID) | INTRAVENOUS | Status: DC
  Administered 2023-12-28 – 2024-01-01 (×16): 1.5 g via INTRAVENOUS
  Filled 2023-12-28 (×18): qty 4

## 2023-12-28 MED ORDER — LISINOPRIL 10 MG PO TABS
10.0000 mg | ORAL_TABLET | Freq: Every day | ORAL | Status: DC
  Administered 2023-12-29: 10 mg via ORAL
  Filled 2023-12-28: qty 1

## 2023-12-28 MED ORDER — SODIUM CHLORIDE 0.9 % IV SOLN: 100.0000 mg | Freq: Two times a day (BID) | INTRAVENOUS | Status: DC

## 2023-12-28 MED ORDER — HYDRALAZINE HCL 50 MG PO TABS
50.0000 mg | ORAL_TABLET | Freq: Four times a day (QID) | ORAL | Status: DC
  Administered 2023-12-28 – 2024-01-01 (×16): 50 mg via ORAL
  Filled 2023-12-28 (×16): qty 1

## 2023-12-28 MED ORDER — LISINOPRIL 5 MG PO TABS: 5.0000 mg | ORAL_TABLET | Freq: Every day | ORAL | Status: DC

## 2023-12-28 MED ORDER — INSULIN ASPART 100 UNIT/ML IJ SOLN
0.0000 [IU] | Freq: Three times a day (TID) | INTRAMUSCULAR | Status: DC
  Administered 2023-12-28: 3 [IU] via SUBCUTANEOUS
  Administered 2023-12-28: 2 [IU] via SUBCUTANEOUS
  Administered 2023-12-29: 5 [IU] via SUBCUTANEOUS

## 2023-12-28 MED ORDER — INSULIN ASPART 100 UNIT/ML IJ SOLN
0.0000 [IU] | Freq: Every day | INTRAMUSCULAR | Status: DC
  Administered 2023-12-28: 2 [IU] via SUBCUTANEOUS

## 2023-12-28 MED ORDER — METHYLPREDNISOLONE SODIUM SUCC 40 MG IJ SOLR
40.0000 mg | Freq: Two times a day (BID) | INTRAMUSCULAR | Status: DC
  Administered 2023-12-28 – 2023-12-29 (×3): 40 mg via INTRAVENOUS
  Filled 2023-12-28 (×3): qty 1

## 2023-12-28 MED ORDER — TRAVASOL 10 % IV SOLN
INTRAVENOUS | Status: AC
  Filled 2023-12-28: qty 1027.1

## 2023-12-28 NOTE — Plan of Care (Signed)
  Problem: Education: Goal: Ability to describe self-care measures that may prevent or decrease complications (Diabetes Survival Skills Education) will improve Outcome: Progressing Goal: Individualized Educational Video(s) Outcome: Progressing   Problem: Coping: Goal: Ability to adjust to condition or change in health will improve Outcome: Progressing   Problem: Fluid Volume: Goal: Ability to maintain a balanced intake and output will improve Outcome: Progressing   Problem: Health Behavior/Discharge Planning: Goal: Ability to identify and utilize available resources and services will improve Outcome: Progressing Goal: Ability to manage health-related needs will improve Outcome: Progressing   Problem: Metabolic: Goal: Ability to maintain appropriate glucose levels will improve Outcome: Progressing   Problem: Nutritional: Goal: Maintenance of adequate nutrition will improve Outcome: Progressing Goal: Progress toward achieving an optimal weight will improve Outcome: Progressing   Problem: Skin Integrity: Goal: Risk for impaired skin integrity will decrease Outcome: Progressing   Problem: Tissue Perfusion: Goal: Adequacy of tissue perfusion will improve Outcome: Progressing   Problem: Education: Goal: Knowledge of General Education information will improve Description: Including pain rating scale, medication(s)/side effects and non-pharmacologic comfort measures Outcome: Progressing   Problem: Health Behavior/Discharge Planning: Goal: Ability to manage health-related needs will improve Outcome: Progressing   Problem: Clinical Measurements: Goal: Ability to maintain clinical measurements within normal limits will improve Outcome: Progressing Goal: Will remain free from infection Outcome: Progressing Goal: Diagnostic test results will improve Outcome: Progressing Goal: Respiratory complications will improve Outcome: Progressing Goal: Cardiovascular complication will  be avoided Outcome: Progressing   Problem: Activity: Goal: Risk for activity intolerance will decrease Outcome: Progressing   Problem: Nutrition: Goal: Adequate nutrition will be maintained Outcome: Progressing   Problem: Coping: Goal: Level of anxiety will decrease Outcome: Progressing   Problem: Elimination: Goal: Will not experience complications related to bowel motility Outcome: Progressing   Problem: Pain Managment: Goal: General experience of comfort will improve and/or be controlled Outcome: Progressing   Problem: Safety: Goal: Ability to remain free from injury will improve Outcome: Progressing   Problem: Skin Integrity: Goal: Risk for impaired skin integrity will decrease Outcome: Progressing

## 2023-12-28 NOTE — Plan of Care (Signed)
   Problem: Education: Goal: Ability to describe self-care measures that may prevent or decrease complications (Diabetes Survival Skills Education) will improve Outcome: Progressing Goal: Individualized Educational Video(s) Outcome: Progressing   Problem: Coping: Goal: Ability to adjust to condition or change in health will improve Outcome: Progressing

## 2023-12-28 NOTE — Progress Notes (Addendum)
 [facility] PROGRESS NOTE    Progress Note  Nicholas Burke  FMW:[id number] DOB: [birth year] DOA: 12/06/2023 PCP: Hugh Charleston, MD (Inactive)     Brief Narrative:   Nicholas Burke is an 78 y.o. male past medical history significant for prediabetes mellitus, essential hypertension prostate cancer comes into the [facility] complaining of epigastric pain CT angio of the chest and abdomen revealed extensive intramural hematoma beginning at the transverse aorta origin, potassium of 2.9, vascular surgery was consulted who recommended tight blood pressure control  Significant events: 8/11 presented with epigastric abdominal pain found to have large intramural hematoma 8/14 remains on esmolol , no indication for urgent surgical intervention 8/15 ongoing issues with ileus 8/16 NGT placed to decompress 8/19 continued high output from NGT. Still on Cleviprex  and esmolol . Seen By GI in consult for melanotic stools. Stopped CCB due to risk of constipation. Started TPN, PICC placed. 8/20 new tremor stopped the reglan . Still trying to get control of BP and HR. Started scheduled IV hydral + esmolol . 8/21 Patient became hypotensive, clonidine  patch was removed, hydralazine , esmolol  were stopped 8/22 on low-dose labetalol  infusion 8/23 had multiple small bowel movements, denies abdominal pain, NG output was 550 cc 8/24 BMs  8/25 hgb drop 5.3, 2 PRBC. AKI  8/25 EGD - Normal larynx. - Tiny hiatal hernia. - Clotted blood in the gastric fundus and in the proximal gastric body. - Normal duodenal bulb, first portion of the duodenum, second portion of the duodenum 8/25 CTA chest/abd/pelvis>>1. No significant change in the previously demonstrated intramural hematoma extending from the origin of the left subclavian artery into the abdomen and right common and external iliac arteries.  Interval increase in aneurysmal dilatation of the proximal descending thoracic aorta, measuring 4.9 cm in diameter. Dilatation of  the stomach and mild dilatation of the proximal and mid small bowel loops with some low-density wall thickening in the pelvis. 8/29 p.o. antihypertensive added 8/31 repeat EGD showing multiple stomach ulcers, nonbleeding Assessment/Plan:   Type B aortic dissection with Intramural aortic hematoma (HCC)/essential hypertension: Blood pressure goal is systolic blood pressure less than 140 with a heart rate less than 80. Started on IV antihypertensive infusion. Has been transition to Coreg  and hydralazine .  Continue IV hydralazine  as needed for systolic blood pressure greater than 150. Blood pressures trending up will add low-dose lisinopril . If able to tolerate can add a diuretic before discharge or as an outpatient. Repeated CTA showed no significant changes of intramural hematoma and I type B aortic dissection. Renal artery duplex was unremarkable.  Acute blood loss anemia/acute upper GI bleed: Hemoglobin dropped you status post 2 units of packed red blood cells. EGD done on 12/20/2023 that showed multiple gastric ulcers without hemorrhage and erythematous gastric mucosa, Z-line regular at 40 cm from incisor. Continue PPI twice a day. Hemoglobin has remained relatively stable. Still has NG tube in place.  Refractory ileus/refeeding syndrome/moderate protein caloric malnutrition: Currently on TPN. Complete course of erythromycin  stimulate gut motility. Ileus is improved. Continue TPN for now. Try to keep potassium greater than 4, phosphorus greater than 3 magnesium  greater than 2. Avoid opiate. Encourage ambulation, continue to work with PT OT.  Acute respiratory failure with hypoxia: The nurse called me that he was short of breath, tight, moving poor air. Start Xopenex , start him on IV Unasyn . Will start him on IV steroids. Get a chest x-ray. Continue Xopenex  scheduled and as needed. Will do a swallowing evaluation rule out aspiration pneumonia.  Acute kidney injury: Likely due to  overdiuresis. Lasix  was held his creatinine is improving.  Diabetes mellitus type 2: Fairly controlled continue sliding scale insulin .  History of prostate cancer: Noted  DVT prophylaxis: scd Family Communication:wife Status is: Inpatient Remains inpatient appropriate because: Type B aortic dissection and new acute upper GI bleed due to multiple gastric ulcers    Code Status:     Code Status Orders  (From admission, onward)           Start     Ordered   12/06/23 1525  Full code  Continuous       Question:  By:  Answer:  Consent: discussion documented in EHR   12/06/23 1526           Code Status History     This patient has a current code status but no historical code status.         IV Access:   Peripheral IV   Procedures and diagnostic studies:   No results found.   Medical Consultants:   None.   Subjective:    Nicholas Burke no abdominal pain or chest pain, minimally 8  Objective:    Vitals:   12/28/23 0445 12/28/23 0446 12/28/23 0447 12/28/23 0500  BP:  (!) 156/98  (!) 152/89  Pulse: 83 85 84 85  Resp: (!) 30 (!) 28 (!) 23 (!) 21  Temp:  97.6 F (36.4 C)    TempSrc:  Oral    SpO2: 95% 96% 94% 97%  Weight:    101.8 kg  Height:       SpO2: 97 % O2 Flow Rate (L/min): 2 L/min   Intake/Output Summary (Last 24 hours) at 12/28/2023 0602 Last data filed at 12/27/2023 1802 Gross per 24 hour  Intake 1235.29 ml  Output --  Net 1235.29 ml   Filed Weights   12/26/23 0352 12/27/23 0645 12/28/23 0500  Weight: 100.9 kg 98.1 kg 101.8 kg    Exam: General exam: In no acute distress. Respiratory system: Good air movement and clear to auscultation. Cardiovascular system: S1 & S2 heard, RRR. No JVD. Gastrointestinal system: Abdomen is nondistended, soft and nontender.  Extremities: No pedal edema. Skin: No rashes, lesions or ulcers Psychiatry: Judgement and insight appear normal. Mood & affect appropriate.    Data Reviewed:     Labs: Basic Metabolic Panel: Recent Labs  Lab 12/22/23 0800 12/23/23 0905 12/24/23 0620 12/25/23 1340 12/26/23 0140 12/26/23 1559 12/27/23 0433 12/28/23 0517  NA 139 142   < > 142 135 141 142 143  K 4.2 3.9   < > 4.3 3.6 4.9 4.6 4.3  CL 111 113*   < > 113* 110 115* 111 111  CO2 23 22   < > 21* 19* 18* 22 23  GLUCOSE 177* 185*   < > 110* 259* 190* 178* 150*  BUN 25* 22   < > 18 15 21  24* 28*  CREATININE 1.17 1.29*   < > 1.26* 1.13 1.36* 1.44* 1.22  CALCIUM  7.4* 7.6*   < > 7.8* 7.2* 7.7* 8.0* 8.1*  MG 2.2 1.9  --   --   --   --  2.2 2.3  PHOS 3.8 3.4  --   --   --   --  3.5 3.4   < > = values in this interval not displayed.   GFR Estimated Creatinine Clearance: 61.6 mL/min (by C-G formula based on SCr of 1.22 mg/dL). Liver Function Tests: Recent Labs  Lab 12/22/23 0800 12/23/23 0905 12/27/23 0433 12/28/23  0517  AST  --  37 34  --   ALT  --  32 38  --   ALKPHOS  --  71 101  --   BILITOT  --  0.7 0.6  --   PROT  --  5.1* 6.2*  --   ALBUMIN  1.7* 1.7* 2.1* 2.1*   No results for input(s): LIPASE, AMYLASE in the last 168 hours. No results for input(s): AMMONIA in the last 168 hours. Coagulation profile Recent Labs  Lab 12/22/23 0500 12/22/23 1000  INR 1.2 1.2   COVID-19 Labs  No results for input(s): DDIMER, FERRITIN, LDH, CRP in the last 72 hours.  Lab Results  Component Value Date   SARSCOV2NAA NOT DETECTED 05/02/2019    CBC: Recent Labs  Lab 12/24/23 0414 12/24/23 0526 12/25/23 0326 12/26/23 0140 12/27/23 0433  WBC 7.3 7.4 8.2 8.4 10.2  HGB 7.4* 7.5* 7.8* 7.6* 8.0*  HCT 23.3* 23.0* 24.6* 24.8* 25.8*  MCV 97.1 93.9 91.1 92.9 91.8  PLT 244 262 259 258 279   Cardiac Enzymes: No results for input(s): CKTOTAL, CKMB, CKMBINDEX, TROPONINI in the last 168 hours. BNP (last 3 results) No results for input(s): PROBNP in the last 8760 hours. CBG: Recent Labs  Lab 12/27/23 0749 12/27/23 1117 12/27/23 1732 12/27/23 2052  12/28/23 0557  GLUCAP 147* 162* 145* 153* 154*   D-Dimer: No results for input(s): DDIMER in the last 72 hours. Hgb A1c: No results for input(s): HGBA1C in the last 72 hours. Lipid Profile: Recent Labs    12/27/23 0433  TRIG 53   Thyroid function studies: No results for input(s): TSH, T4TOTAL, T3FREE, THYROIDAB in the last 72 hours.  Invalid input(s): FREET3 Anemia work up: No results for input(s): VITAMINB12, FOLATE, FERRITIN, TIBC, IRON, RETICCTPCT in the last 72 hours. Sepsis Labs: Recent Labs  Lab 12/24/23 0526 12/25/23 0326 12/26/23 0140 12/27/23 0433  WBC 7.4 8.2 8.4 10.2   Microbiology No results found for this or any previous visit (from the past 240 hours).   Medications:    carvedilol   25 mg Oral BID WC   Chlorhexidine  Gluconate Cloth  6 each Topical Daily   feeding supplement  237 mL Oral BID BM   Gerhardt's butt cream   Topical BID   heparin  injection (subcutaneous)  5,000 Units Subcutaneous Q8H   hydrALAZINE   50 mg Oral Q8H   insulin  aspart  0-9 Units Subcutaneous TID WC   multivitamin with minerals  1 tablet Oral Daily   pantoprazole   40 mg Oral BID AC   polyethylene glycol  17 g Oral Daily   Continuous Infusions:  TPN CYCLIC-ADULT (ION) 106 mL/hr at 12/27/23 1905      LOS: 22 days   Nicholas Burke  [facility]  12/28/2023, 6:02 AM

## 2023-12-28 NOTE — Progress Notes (Addendum)
 PHARMACY - TOTAL PARENTERAL NUTRITION CONSULT NOTE  Indication: Prolonged ileus  Patient Measurements: Height: 5' 10 (177.8 cm) Weight: 101.8 kg (224 lb 6.9 oz) IBW/kg (Calculated) : 73 TPN AdjBW (KG): 90.7 Body mass index is 32.2 kg/m. Usual Weight: 91.2 kg   Assessment:  78 yo M admitted with aortic dissection with intramural hematoma. Patient developed severe ileus secondary to dissection. Without nutrition for 7 days and with high output from NGT at the start of TPN.   Patient reports he eats to live and does not eat much at home.  He typically eats breakfast and dinner and snacks at lunch time.  Glucose / Insulin : A1c 7.1% - CBGs < 180 Dexamethasone  x1 8/31 Used 7 units SSI in past 24 hrs and 15 units inTPN  Electrolytes: K 4.3 (goal 4-4.5 per primary), others WNL (Na and Mag high normal) Renal: SCr down 1.22, BUN 20s Hepatic: LFTs / tbili / TG WNL, albumin  2.1 Intake / Output; MIVF: UOP not charted (last Lasix  8/31), LBM 9/1 8/31 neostigmine  x1 GI Imaging:  8/11 CT - extensive intramural hematoma involving the descending thoracic aorta extending throughout abd aorta and into R inflow vessels 8/14 DG - adynamic ileus 8/17 DG - persistent but improved gaseous gastric distension  8/19 DG - decr gaseous distention of stomach, persistent distension of small bowel  8/24 DG: persistent ileus  8/25 CT: enteritis with pSBO or gastric and small bowel ileus  GI Surgeries / Procedures:  8/25 EGD - minimal NG tube trauma, old clotted blood found in gastric fundus  8/31 EGD- non-bleeding gastric ulcers biopsied   Central access: PICC placed 12/14/23 TPN start date: 12/14/23  Nutritional Goals: Goal TPN rate is 80 mL/hr (provides 103g AA and 1906 kcals per day)  RD Estimated Needs Total Energy Estimated Needs: 1900-2100 kcals Total Protein Estimated Needs: 95-105 g Total Fluid Estimated Needs: >/= 2L (not taking into account high NG output)  Current Nutrition:  TPN Ensure  Plus High Protein BID - 1 charted given on 9/1 8/31 regular diet - ate a bite or 2, no appetite and no taste  Plan: Continue 18-hr cyclic TPN (53-106 mL/hr with 1-hr taper up/down, GIR 1.3-2.6 mg/kg/min) to provide 100% of needs  Electrolytes in TPN: Na 25 mEq/L, K 47mEq/L, Ca 3 mEq/L, Mg 11mEq/L, Phos 14 mmol/L, Cl:Ac 1:2 - tweak lytes 9/2 (8/31 labs inconsistent) PO multivitamin daily (no MVI and trace elements in TPN) Continue moderate SSI TIDwm and 15 units regular insulin  in TPN Monitor TPN labs on Mon/Thurs - next labs 9/4 Monitor PO intake, tolerance to cyclic TPN to reduce cycle further  Monque Haggar D. Lendell, PharmD, BCPS, BCCCP 12/28/2023, 9:06 AM  =============  Addendum:  CBG off TPN acceptable, plan to reduce TPN cycle in AM  Dominick Zertuche D. Lendell, PharmD, BCPS, BCCCP 12/28/2023, 2:49 PM

## 2023-12-28 NOTE — Progress Notes (Addendum)
 Nutrition Follow-up  DOCUMENTATION CODES:   Non-severe (moderate) malnutrition in context of acute illness/injury  INTERVENTION:   Continue cyclic TPN to meet nutrition needs, okay to reduce TPN amount tomorrow Continue Ensure Plus High Protein po BID, each supplement provides 350 kcal and 20 grams of protein Add Magic cup TID with meals, each supplement provides 290 kcal and 9 grams of protein Continue MVI with minerals daily  NUTRITION DIAGNOSIS:   Moderate Malnutrition related to acute illness (Type B aortic dissection with hematoma causing prolonged SB ileus) as evidenced by energy intake < 75% for > 7 days, mild fat depletion, mild muscle depletion; ongoing.  GOAL:   Patient will meet greater than or equal to 90% of their needs; met with TPN + POs  MONITOR:   Diet advancement, Weight trends, Labs, Skin, I & O's (TPN)  REASON FOR ASSESSMENT:   Consult New TPN/TNA  ASSESSMENT:   78 yo male admitted with type B aortic dissection with intramural hematoma likely secondary to poorly controlled HTN; +HTN emergency requiring cleviprex . +AKI. Pt developed severe ileus due to dissection.   PMH includes DM, HTN, prostate cancer  8/11 Admitted with abd pain, +large intramural hematoma, type B aortic dissection 8/16 NGT inserted for decompression 8/18 Abd xray with improved gaseous distention of stomach, Diffuse gaseous distention of small bowel evident with small bowel loops measuring up to 4.4 cm diameter in the mid abdomen 8/19 TPN intiiated 8/20 New tremor, reglan  stopped 8/21 Code Stroke, hypotensive and confused 8/25 EGD: Clotted blood in gastric fundus and proximal gastric body; NG tube removed 8/31 repeat EGD: multiple non bleeding stomach ulcers; diet advanced to regular  Patient remains on a regular diet. Meal intakes variable at 15-100%.  Remains on TPN; cyclic regimen over 18 hours provides 1802 ml, 1906 kcal, 103 gm protein daily. This meets 100% of estimated  nutrition needs.   Labs reviewed.  CBG: 154-110-107  Medications reviewed and include miralax , MVI with minerals, solu-medrol , novolog .  Admit weight: 90.7 kg Current weight: 101.8 kg  Patient with mild pitting edema to BLE per RN assessment.  Diet Order:   Diet Order             Diet regular Room service appropriate? Yes; Fluid consistency: Thin  Diet effective now                   EDUCATION NEEDS:   Education needs have been addressed  Skin:  Skin Assessment: Reviewed RN Assessment  Last BM:  9/1 type 6  Height:   Ht Readings from Last 1 Encounters:  12/06/23 5' 10 (1.778 m)   Weight:   Wt Readings from Last 1 Encounters:  12/28/23 101.8 kg   BMI:  Body mass index is 32.2 kg/m.  Estimated Nutritional Needs:   Kcal:  1900-2100 kcals  Protein:  95-105 g  Fluid:  >/= 2 L   Suzen HUNT RD, LDN, CNSC Contact via secure chat. If unavailable, use group chat RD Inpatient.

## 2023-12-29 DIAGNOSIS — I7100 Dissection of unspecified site of aorta: Secondary | ICD-10-CM | POA: Diagnosis not present

## 2023-12-29 LAB — GLUCOSE, CAPILLARY
Glucose-Capillary: 227 mg/dL — ABNORMAL HIGH (ref 70–99)
Glucose-Capillary: 200 mg/dL — ABNORMAL HIGH (ref 70–99)
Glucose-Capillary: 158 mg/dL — ABNORMAL HIGH (ref 70–99)
Glucose-Capillary: 193 mg/dL — ABNORMAL HIGH (ref 70–99)

## 2023-12-29 LAB — BASIC METABOLIC PANEL WITH GFR
Anion gap: 9 (ref 5–15)
BUN: 32 mg/dL — ABNORMAL HIGH (ref 8–23)
CO2: 22 mmol/L (ref 22–32)
Calcium: 8.2 mg/dL — ABNORMAL LOW (ref 8.9–10.3)
Chloride: 110 mmol/L (ref 98–111)
Creatinine, Ser: 1.24 mg/dL (ref 0.61–1.24)
GFR, Estimated: >60 mL/min (ref >60)
Glucose, Bld: 235 mg/dL — ABNORMAL HIGH (ref 70–99)
Potassium: 4.8 mmol/L (ref 3.5–5.1)
Sodium: 141 mmol/L (ref 135–145)

## 2023-12-29 LAB — SURGICAL PATHOLOGY

## 2023-12-29 MED ORDER — FUROSEMIDE 10 MG/ML IJ SOLN
60.0000 mg | Freq: Once | INTRAMUSCULAR | Status: AC
  Administered 2023-12-29: 60 mg via INTRAVENOUS
  Filled 2023-12-29: qty 6

## 2023-12-29 MED ORDER — INSULIN ASPART 100 UNIT/ML IJ SOLN
0.0000 [IU] | Freq: Three times a day (TID) | INTRAMUSCULAR | Status: DC
  Administered 2023-12-29 – 2023-12-30 (×3): 4 [IU] via SUBCUTANEOUS
  Administered 2023-12-30: 7 [IU] via SUBCUTANEOUS
  Administered 2023-12-31: 3 [IU] via SUBCUTANEOUS
  Administered 2023-12-31: 4 [IU] via SUBCUTANEOUS

## 2023-12-29 MED ORDER — LISINOPRIL 20 MG PO TABS
20.0000 mg | ORAL_TABLET | Freq: Every day | ORAL | Status: DC
  Administered 2023-12-30 – 2023-12-31 (×2): 20 mg via ORAL
  Filled 2023-12-29 (×2): qty 1

## 2023-12-29 MED ORDER — METHYLPREDNISOLONE SODIUM SUCC 40 MG IJ SOLR
40.0000 mg | Freq: Every day | INTRAMUSCULAR | Status: DC
  Administered 2023-12-30 – 2024-01-01 (×3): 40 mg via INTRAVENOUS
  Filled 2023-12-29 (×3): qty 1

## 2023-12-29 MED ORDER — TRAVASOL 10 % IV SOLN
INTRAVENOUS | Status: AC
  Filled 2023-12-29: qty 1027.14
  Filled 2023-12-29: qty 1027.1

## 2023-12-29 NOTE — Evaluation (Signed)
 Clinical/Bedside Swallow Evaluation Patient Details  Name: Taahir Grisby MRN: [id number] Date of Birth: [birth year]  Today's Date: 12/29/2023 Time: SLP Start Time (ACUTE ONLY): 0810 SLP Stop Time (ACUTE ONLY): 0816 SLP Time Calculation (min) (ACUTE ONLY): 6 min  Past Medical History:  Past Medical History:  Diagnosis Date   Hx of radiation therapy 01/02/13- 02/24/13   prostate 7800 cGy/40 sessions, seminal vesicles 5600 cGy/40 sessions   Hypertension    Pre-diabetes    Prostate cancer (HCC) 08/30/2012   gleason 6, vol 65.7 cc   Past Surgical History:  Past Surgical History:  Procedure Laterality Date   CATARACT EXTRACTION, BILATERAL Bilateral    COLONOSCOPY WITH PROPOFOL  N/A 10/18/2014   Procedure: COLONOSCOPY WITH PROPOFOL ;  Surgeon: [name], MD;  Location: [facility];  Service: Endoscopy;  Laterality: N/A;   CRYOABLATION N/A 12/24/2020   Procedure: CRYO ABLATION PROSTATE;  Surgeon: [name], MD;  Location: [facility] OR[facility];  Service: Urology;  Laterality: N/A;   ESOPHAGOGASTRODUODENOSCOPY N/A 12/20/2023   Procedure: EGD (ESOPHAGOGASTRODUODENOSCOPY);  Surgeon: [name], MD;  Location: [facility] ENDOSCOPY;  Service: Gastroenterology;  Laterality: N/A;   ESOPHAGOGASTRODUODENOSCOPY N/A 12/26/2023   Procedure: EGD (ESOPHAGOGASTRODUODENOSCOPY);  Surgeon: [name], MD;  Location[facility] END[facility]COPY;  Service: Gastroenterology;  Laterality: N/A;   EYE SURGERY Bilateral    cataract surgery   HOT HEMOSTASIS N/A 10/18/2014   Procedure: HOT HEMOSTASIS (ARGON PLASMA COAGULATION/BICAP);  Surgeon: [name], MD;  Location: [facility];  Service: Endoscopy;  Laterality: N/A;   PROSTATE BIOPSY  08/2012   HPI:  Lyndon Chapel is an 78 y.o. male who presented to the ED complaining of epigastric pain CT angio of the chest and abdomen revealed extensive intramural hematoma beginning at the transverse aorta origin. Course complicated by UGI bleed, ileus. Has been on TPN. CXR 9/2: Low lung volumes.  Similar small left pleural effusion with patchy left basilar airspace opacities, likely atelectasis.  Pt with past medical history significant for prediabetes mellitus, essential hypertension prostate cancer.    Assessment / Plan / Recommendation  Clinical Impression  Pt prsesents with functional swallowing as assessed clinically.  Pt tolerated all consistencies trialed without any clinical s/s of aspiration, including straw sips of liquid, and exhibited excellent oral clearance of solids independently.  Pt reports some coughing with PO intake which was not observed today.  Pt reports no hx pna.  Counseled pt to let provider know if coughing with POs returns.  Recommend advancing diet per GI followign EGD.  Pt continuing TPN at this time, with diet orders for regular diet.  Had AM meal tray with soft foods (broth, grits) on arrival.  Pt has no further ST needs.  SLP will sign off.    Recommend regular texture diet with thin liquids.   SLP Visit Diagnosis: Dysphagia, unspecified (R13.10)    Aspiration Risk  No limitations    Diet Recommendation Regular;Thin liquid    Liquid Administration via: Cup;Straw Medication Administration: Whole meds with liquid Supervision: Patient able to self feed Compensations: Slow rate;Small sips/bites Postural Changes: Seated upright at 90 degrees    Other  Recommendations Oral Care Recommendations: Oral care BID     Assistance Recommended at Discharge    Functional Status Assessment Patient has not had a recent decline in their functional status  Frequency and Duration  (N/A)          Prognosis Prognosis for improved oropharyngeal function:  (N/A)      Swallow Study   General Date of Onset: 12/06/23 HPI: Caleel  Romanello is an 78 y.o. male who presented to the ED complaining of epigastric pain CT angio of the chest and abdomen revealed extensive intramural hematoma beginning at the transverse aorta origin. Course complicated by UGI bleed, ileus. Has  been on TPN. CXR 9/2: Low lung volumes. Similar small left pleural effusion with patchy left basilar airspace opacities, likely atelectasis.  Pt with past medical history significant for prediabetes mellitus, essential hypertension prostate cancer. Type of Study: Bedside Swallow Evaluation Previous Swallow Assessment: None Diet Prior to this Study: Regular;TPN;Thin liquids (Level 0) Temperature Spikes Noted: No Respiratory Status: Room air History of Recent Intubation: No Behavior/Cognition: Alert;Cooperative;Pleasant mood Oral Cavity Assessment: Within Functional Limits Oral Care Completed by SLP: No Oral Cavity - Dentition: Adequate natural dentition Vision: Functional for self-feeding Self-Feeding Abilities: Able to feed self Patient Positioning: Upright in chair Baseline Vocal Quality: Normal Volitional Cough: Strong Volitional Swallow: Able to elicit    Oral/Motor/Sensory Function Overall Oral Motor/Sensory Function: Within functional limits   Ice Chips Ice chips: Not tested   Thin Liquid Thin Liquid: Within functional limits Presentation: Straw    Nectar Thick Nectar Thick Liquid: Not tested   Honey Thick Honey Thick Liquid: Not tested   Puree Puree: Within functional limits Presentation: Spoon   Solid     Solid: Within functional limits Presentation: Self Fed      Anette FORBES Grippe, MA, CCC-SLP Acute Rehabilitation Services Office: [phone] 12/29/2023,8:27 AM

## 2023-12-29 NOTE — Progress Notes (Addendum)
 Patient seen and examined this afternoon.  He was ambulating with physical therapy, in great spirits.  Compared to when I saw him last, he looks much better.  Continues to deny abdominal pain back pain, chest pain.  He is aware that any intervention at this time would come with significant morbidity.  Will continue to allow the aorta to heal, and address aneurysmal dilation in the outpatient setting once the IMH is more chronic.  Will see an outpatient follow-up at this time.  Nicholas Burke E Nicholas Burke

## 2023-12-29 NOTE — Progress Notes (Signed)
 [facility] Gastroenterology Progress Note  Nicholas Burke 78 y.o. 1946-02-01   Subjective: About to eat solid food for lunch. Denies abdominal pain.  Objective: Vital signs: Vitals:   12/29/23 1135 12/29/23 1136  BP: (!) 162/102 (!) 152/86  Pulse: 88   Resp: (!) 30   Temp: 97.7 F (36.5 C)   SpO2: 96%     Physical Exam: Hzw:zoizmob, well-nourished, no acute distress  HEENT: anicteric sclera CV: RRR Chest: CTA B Abd: soft, nontender, nondistended, +BS Ext: no edema  Lab Results: Recent Labs    12/27/23 0433 12/28/23 0517 12/29/23 0424  NA 142 143 141  K 4.6 4.3 4.8  CL 111 111 110  CO2 22 23 22   GLUCOSE 178* 150* 235*  BUN 24* 28* 32*  CREATININE 1.44* 1.22 1.24  CALCIUM  8.0* 8.1* 8.2*  MG 2.2 2.3  --   PHOS 3.5 3.4  --    Recent Labs    12/27/23 0433 12/28/23 0517  AST 34  --   ALT 38  --   ALKPHOS 101  --   BILITOT 0.6  --   PROT 6.2*  --   ALBUMIN  2.1* 2.1*   Recent Labs    12/27/23 0433  WBC 10.2  HGB 8.0*  HCT 25.8*  MCV 91.8  PLT 279      Assessment/Plan: Gastric ulcers - biopsies pending. Agree with regular diet as recommended by Saintclair. No further GI recs. Will sign off. Call if questions.   Jerrell JAYSON Sol 12/29/2023, 2:58 PM  Questions please call [phone] ID: Nicholas Burke, male   DOB: 1946/02/26, 78 y.o.   MRN: [id number]

## 2023-12-29 NOTE — Progress Notes (Signed)
 PHARMACY - TOTAL PARENTERAL NUTRITION CONSULT NOTE  Indication: Prolonged ileus  Patient Measurements: Height: 5' 10 (177.8 cm) Weight: 101.8 kg (224 lb 6.9 oz) IBW/kg (Calculated) : 73 TPN AdjBW (KG): 90.7 Body mass index is 32.2 kg/m. Usual Weight: 91.2 kg   Assessment:  78 yo M admitted with aortic dissection with intramural hematoma. Patient developed severe ileus secondary to dissection. Without nutrition for 7 days and with high output from NGT at the start of TPN.   Patient reports he eats to live and does not eat much at home.  He typically eats breakfast and dinner and snacks at lunch time.  Glucose / Insulin : A1c 7.1% - CBGs 139-235 after start of steroids Dexamethasone  x1 8/31, Methylpred started 9/2 Used 4 units SSI in past 24 hrs and 15 units inTPN  Electrolytes: K 4.8 (goal 4-4.5 per primary), others WNL (Na and Mag high normal) Renal: SCr down 1.24, BUN 32 Hepatic: LFTs / tbili / TG WNL, albumin  2.1 Intake / Output; MIVF: UOP not charted (last Lasix  8/31), LBM 9/1 8/31 neostigmine  x1 GI Imaging:  8/11 CT - extensive intramural hematoma involving the descending thoracic aorta extending throughout abd aorta and into R inflow vessels 8/14 DG - adynamic ileus 8/17 DG - persistent but improved gaseous gastric distension  8/19 DG - decr gaseous distention of stomach, persistent distension of small bowel  8/24 DG: persistent ileus  8/25 CT: enteritis with pSBO or gastric and small bowel ileus  GI Surgeries / Procedures:  8/25 EGD - minimal NG tube trauma, old clotted blood found in gastric fundus  8/31 EGD- non-bleeding gastric ulcers biopsied   Central access: PICC placed 12/14/23 TPN start date: 12/14/23  Nutritional Goals: Goal TPN rate is 80 mL/hr (provides 103g AA and 1906 kcals per day)  RD Estimated Needs Total Energy Estimated Needs: 1900-2100 kcals Total Protein Estimated Needs: 95-105 g Total Fluid Estimated Needs: >/= 2 L  Current Nutrition:  TPN -  tolerated 18-hr cycle Ensure Plus High Protein BID - 2 charted given, consumed 1 per pt on 9/2 8/31 regular diet - ate a bite or 2, no appetite and no taste Magic cup TID - consumed 1 on 9/2 per pt  Plan: Begin 16-hr cyclic TPN (60-120 mL/hr with 1-hr taper up/down, GIR 1.47-2.95 mg/kg/min) to provide 100% of needs  Electrolytes in TPN: Na 25 mEq/L, K 91mEq/L, Ca 3 mEq/L, Mg 54mEq/L, Phos 14 mmol/L, Cl:Ac 1:2 - tweak lytes 9/2 (8/31 labs inconsistent) PO multivitamin daily (no MVI and trace elements in TPN) Increase to resistant SSI TIDwm and 15 units regular insulin  in TPN Monitor TPN labs on Mon/Thurs - next labs 9/4 Monitor PO intake, tolerance to cyclic TPN to reduce cycle further  Thank you for involving pharmacy in this patient's care.  Delon Sax, PharmD, BCPS Clinical Pharmacist Clinical phone for 12/29/2023 is [phone] 12/29/2023 7:01 AM

## 2023-12-29 NOTE — Progress Notes (Signed)
 Pts sister, Ivin [phone], is wanting to talk to MD about his BP control. MD notified.  Zyliah Schier L Xzavior Reinig, RN

## 2023-12-29 NOTE — Progress Notes (Signed)
 Physical Therapy Discharge Note  Patient Details Name: Renel Ende MRN: [id number] DOB: [birth year] Today's Date: 12/29/2023   History of Present Illness Pt is 78 year old presented to [facility] on  12/06/23 for Type B aortic dissection with intramural hematoma. Developed severe ileus due to dissection. Developed acute respiratory failure with hypoxia suspected due to aspiration from nonfunctioning NGT. Developed GIB. PMH - htn, prostate CA    PT Comments  Pt has met all of his goals. Pt currently is mod I for bed mobility, sit to stand and gait without an AD. Pt is supervision for descending stairs and mod I for ascending stairs per home set up. Spouse is supportive. Due to pt current functional status, home set up and available assistance at home recommending skilled physical therapy services 3x/week in order to address strength, balance and functional mobility to decrease risk for falls, injury and re-hospitalization.  Pt will be discharged from skilled physical therapy services at this time; please re-consult if further needs arise.         If plan is discharge home, recommend the following: Assist for transportation;Help with stairs or ramp for entrance;Assistance with cooking/housework;Supervision due to cognitive status     Equipment Recommendations  None recommended by PT       Precautions / Restrictions Precautions Precautions: Other (comment) Recall of Precautions/Restrictions: Intact Precaution/Restrictions Comments: bowel/urine incontinence - has briefs now Restrictions Weight Bearing Restrictions Per Provider Order: No     Mobility  Bed Mobility Overal bed mobility: Modified Independent      Transfers Overall transfer level: Modified independent Equipment used: None Transfers: Sit to/from Stand Sit to Stand: Modified independent (Device/Increase time)      Ambulation/Gait Ambulation/Gait assistance: Modified independent (Device/Increase time) Gait Distance (Feet): 300  Feet Assistive device: None Gait Pattern/deviations: Step-through pattern, Decreased stride length, Wide base of support Gait velocity: mildly decreased Gait velocity interpretation: 1.31 - 2.62 ft/sec, indicative of limited community ambulator   General Gait Details: slightly increased BOS, no significant deviations.   Stairs Stairs: Yes Stairs assistance: Supervision Stair Management: One rail Left, Step to pattern, Forwards, No rails Number of Stairs: 3 General stair comments: no rail ascending, one rail descending with step to gait pattern for navigating stairs per home set up.     Balance Overall balance assessment: Mild deficits observed, not formally tested          Communication Communication Communication: No apparent difficulties  Cognition Arousal: Alert Behavior During Therapy: WFL for tasks assessed/performed   PT - Cognitive impairments: No apparent impairments     Following commands: Intact      Cueing Cueing Techniques: Verbal cues     General Comments General comments (skin integrity, edema, etc.): spouse present and supportive.      Pertinent Vitals/Pain Pain Assessment Pain Assessment: No/denies pain     PT Goals (current goals can now be found in the care plan section) Acute Rehab PT Goals Patient Stated Goal: return home PT Goal Formulation: With patient Potential to Achieve Goals: Good Progress towards PT goals: Goals met/education completed, patient discharged from PT    Frequency    Min 2X/week      PT Plan  Pt will be discharged at this time.        AM-PAC PT 6 Clicks Mobility   Outcome Measure  Help needed turning from your back to your side while in a flat bed without using bedrails?: None Help needed moving from lying on your back to sitting on  the side of a flat bed without using bedrails?: None Help needed moving to and from a bed to a chair (including a wheelchair)?: None Help needed standing up from a chair using  your arms (e.g., wheelchair or bedside chair)?: None Help needed to walk in hospital room?: None Help needed climbing 3-5 steps with a railing? : A Little 6 Click Score: 23    End of Session Equipment Utilized During Treatment: Gait belt Activity Tolerance: Patient tolerated treatment well Patient left: in bed;with call bell/phone within reach;with family/visitor present Nurse Communication: Mobility status PT Visit Diagnosis: Other abnormalities of gait and mobility (R26.89);Muscle weakness (generalized) (M62.81);Difficulty in walking, not elsewhere classified (R26.2)     Time: 8450-8398 PT Time Calculation (min) (ACUTE ONLY): 12 min  Charges:    $Therapeutic Activity: 8-22 mins PT General Charges $$ ACUTE PT VISIT: 1 Visit                     Dorothyann Maier, DPT, CLT  Acute Rehabilitation Services Office: [phone] (Secure chat preferred)    Dorothyann VEAR Maier 12/29/2023, 4:26 PM

## 2023-12-29 NOTE — Progress Notes (Addendum)
 PROGRESS NOTE    Nicholas Burke  FMW:[id number] DOB: May 11, 1945 DOA: 12/06/2023 PCP: Hugh Charleston, MD (Inactive)   Brief Narrative:  78 y.o. male past medical history significant for prediabetes mellitus, essential hypertension, prostate cancer presented with epigastric pain. CT angio of the chest and abdomen revealed extensive intramural hematoma beginning at the transverse aorta origin.  Vascular surgery was consulted.  Hospital course as below.  Significant events: 8/11 presented with epigastric abdominal pain found to have large intramural hematoma 8/14 remains on esmolol , no indication for urgent surgical intervention 8/15 ongoing issues with ileus 8/16 NGT placed to decompress 8/19 continued high output from NGT. Still on Cleviprex  and esmolol . Seen By GI in consult for melanotic stools. Stopped CCB due to risk of constipation. Started TPN, PICC placed. 8/20 new tremor stopped the reglan . Still trying to get control of BP and HR. Started scheduled IV hydral + esmolol . 8/21 Patient became hypotensive, clonidine  patch was removed, hydralazine , esmolol  were stopped 8/22 on low-dose labetalol  infusion 8/23 had multiple small bowel movements, denies abdominal pain, NG output was 550 cc 8/24 BMs  8/25 hgb drop 5.3, 2 PRBC. AKI  8/25 EGD - Normal larynx. - Tiny hiatal hernia. - Clotted blood in the gastric fundus and in the proximal gastric body. - Normal duodenal bulb, first portion of the duodenum, second portion of the duodenum 8/25 CTA chest/abd/pelvis>>1. No significant change in the previously demonstrated intramural hematoma extending from the origin of the left subclavian artery into the abdomen and right common and external iliac arteries.  Interval increase in aneurysmal dilatation of the proximal descending thoracic aorta, measuring 4.9 cm in diameter. Dilatation of the stomach and mild dilatation of the proximal and mid small bowel loops with some low-density wall thickening in the  pelvis. 8/29 p.o. antihypertensive added 8/31 repeat EGD showing multiple stomach ulcers, nonbleeding  Assessment & Plan:   Type B aortic dissection with intramural aortic hematoma -Managed conservatively as per vascular surgery recommendations.  Blood pressure goal is SBP of less than 140 and heart rate of less than 80 - Initially treated with IV antihypertensive infusion: Subsequently discontinued - Currently on oral Coreg , hydralazine .  Lisinopril  also has been added.  Blood pressure continues to be on the higher side.  Will give 1 dose of IV Lasix  as patient has positive fluid balance - Repeat CTA showed no significant changes of intramural hematoma and type B dissection.  Renal artery duplex was unremarkable - Outpatient follow-up with vascular surgery  Acute blood loss anemia/upper GI bleeding Gastric ulcers -Has required 5 units packed red cell transfusion during this hospitalization.  Most recent hemoglobin was 8 on 12/27/2023.  Monitor H&H intermittently. - EGD on 12/20/2023 had shown multiple gastric ulcers without hemorrhage and erythematous gastric mucosa.  Patient had repeat EGD on 12/26/2023 which showed nonbleeding gastric ulcers with a clean ulcer base which was biopsied and erythematous mucosa in the gastric body and antrum, biopsied.  GI recommending to continue PPI twice daily for 2 months.  Pathology pending.  Acute respiratory with hypoxia Possible aspiration pneumonia - Chest x-ray on 12/28/23 had shown patchy left basilar airspace opacities, likely atelectasis.  He was started on IV Unasyn  and Solu-Medrol  along with nebs. -Diet as per SLP recommendations.  Diuretic plan as above - Requiring 3 L oxygen via nasal cannula intermittently.  Wean off as able  Refractory ileus Refeeding syndrome Protein calorie malnutrition -Currently on TPN.  Completed course of erythromycin  to stimulate gut motility.  Ileus has improved.  Had  bowel movements this morning. - Will ask dietitian  to do calorie count.  Wean off TPN if possible.  Encourage oral intake. - Encourage ambulation.  Continue to work with PT/OT  Acute kidney injury - Resolved.  Monitor  Acute metabolic acidosis - Resolved  Diabetes mellitus type 2 with hyperglycemia - Continue CBGs with SSI.    History of prostate cancer BPH - Continue finasteride .  Outpatient follow-up with urology  Obesity class I - Outpatient follow-up   DVT prophylaxis: Heparin  subcutaneous Code Status: Full Family Communication: None at bedside Disposition Plan: Status is: Inpatient Remains inpatient appropriate because: Of severity of illness  Consultants: Vascular surgery/PCCM/nephrology/GI/neurology  Procedures: As above  Antimicrobials:  Anti-infectives (From admission, onward)    Start     Dose/Rate Route Frequency Ordered Stop   12/28/23 1330  doxycycline (VIBRAMYCIN) 100 mg in sodium chloride  0.9 % 250 mL IVPB  Status:  Discontinued        100 mg 125 mL/hr over 120 Minutes Intravenous Every 12 hours 12/28/23 1231 12/28/23 1232   12/28/23 1330  ampicillin -sulbactam (UNASYN ) 1.5 g in sodium chloride  0.9 % 100 mL IVPB        1.5 g 200 mL/hr over 30 Minutes Intravenous Every 6 hours 12/28/23 1232     12/25/23 1400  erythromycin  250 mg in sodium chloride  0.9 % 100 mL IVPB        250 mg 100 mL/hr over 60 Minutes Intravenous Every 8 hours 12/25/23 1307 12/27/23 0636   12/20/23 1245  Ampicillin -Sulbactam (UNASYN ) 3 g in sodium chloride  0.9 % 100 mL IVPB        3 g 200 mL/hr over 30 Minutes Intravenous Every 6 hours 12/20/23 1147 12/25/23 0655   12/19/23 0945  erythromycin  250 mg in sodium chloride  0.9 % 100 mL IVPB        250 mg 100 mL/hr over 60 Minutes Intravenous Every 6 hours 12/19/23 0937 12/21/23 0649   12/17/23 1200  erythromycin  250 mg in sodium chloride  0.9 % 100 mL IVPB  Status:  Discontinued        250 mg 100 mL/hr over 60 Minutes Intravenous Every 6 hours 12/17/23 0910 12/17/23 0913   12/17/23 1000   erythromycin  250 mg in sodium chloride  0.9 % 100 mL IVPB        250 mg 100 mL/hr over 60 Minutes Intravenous Every 8 hours 12/17/23 0913 12/19/23 0713        Subjective: Patient seen and examined at bedside.  Feels slightly better.  No fever, vomiting, worsening abdominal pain reported.  Had bowel movements this morning.  Objective: Vitals:   12/29/23 0837 12/29/23 0849 12/29/23 1135 12/29/23 1136  BP: (!) 157/96 (!) 157/96 (!) 162/102 (!) 152/86  Pulse: 91 85 88   Resp: (!) 26 20 (!) 30   Temp: (!) 97.4 F (36.3 C) (!) 97.4 F (36.3 C) 97.7 F (36.5 C)   TempSrc: Oral  Oral   SpO2: 93% 98% 96%   Weight:      Height:        Intake/Output Summary (Last 24 hours) at 12/29/2023 1338 Last data filed at 12/29/2023 0904 Gross per 24 hour  Intake 120 ml  Output --  Net 120 ml   Filed Weights   12/26/23 0352 12/27/23 0645 12/28/23 0500  Weight: 100.9 kg 98.1 kg 101.8 kg    Examination:  General exam: Appears calm and comfortable.  Looks chronically ill and deconditioned. Respiratory system: Bilateral decreased breath sounds at  bases with scattered crackles and intermittent tachypnea Cardiovascular system: S1 & S2 heard, Rate controlled Gastrointestinal system: Abdomen is distended, soft and nontender. Normal bowel sounds heard. Extremities: No cyanosis, clubbing; trace lower extremity edema  Central nervous system: Alert and oriented.  Slow to respond.  No focal neurological deficits. Moving extremities Skin: No rashes, lesions or ulcers Psychiatry: Flat affect.  Not agitated.     Data Reviewed: I have personally reviewed following labs and imaging studies  CBC: Recent Labs  Lab 12/24/23 0414 12/24/23 0526 12/25/23 0326 12/26/23 0140 12/27/23 0433  WBC 7.3 7.4 8.2 8.4 10.2  HGB 7.4* 7.5* 7.8* 7.6* 8.0*  HCT 23.3* 23.0* 24.6* 24.8* 25.8*  MCV 97.1 93.9 91.1 92.9 91.8  PLT 244 262 259 258 279   Basic Metabolic Panel: Recent Labs  Lab 12/23/23 0905  12/24/23 0620 12/26/23 0140 12/26/23 1559 12/27/23 0433 12/28/23 0517 12/29/23 0424  NA 142   < > 135 141 142 143 141  K 3.9   < > 3.6 4.9 4.6 4.3 4.8  CL 113*   < > 110 115* 111 111 110  CO2 22   < > 19* 18* 22 23 22   GLUCOSE 185*   < > 259* 190* 178* 150* 235*  BUN 22   < > 15 21 24* 28* 32*  CREATININE 1.29*   < > 1.13 1.36* 1.44* 1.22 1.24  CALCIUM  7.6*   < > 7.2* 7.7* 8.0* 8.1* 8.2*  MG 1.9  --   --   --  2.2 2.3  --   PHOS 3.4  --   --   --  3.5 3.4  --    < > = values in this interval not displayed.   GFR: Estimated Creatinine Clearance: 60.6 mL/min (by C-G formula based on SCr of 1.24 mg/dL). Liver Function Tests: Recent Labs  Lab 12/23/23 0905 12/27/23 0433 12/28/23 0517  AST 37 34  --   ALT 32 38  --   ALKPHOS 71 101  --   BILITOT 0.7 0.6  --   PROT 5.1* 6.2*  --   ALBUMIN  1.7* 2.1* 2.1*   No results for input(s): LIPASE, AMYLASE in the last 168 hours. No results for input(s): AMMONIA in the last 168 hours. Coagulation Profile: No results for input(s): INR, PROTIME in the last 168 hours. Cardiac Enzymes: No results for input(s): CKTOTAL, CKMB, CKMBINDEX, TROPONINI in the last 168 hours. BNP (last 3 results) No results for input(s): PROBNP in the last 8760 hours. HbA1C: No results for input(s): HGBA1C in the last 72 hours. CBG: Recent Labs  Lab 12/28/23 1412 12/28/23 1649 12/28/23 2056 12/29/23 0621 12/29/23 1133  GLUCAP 107* 139* 207* 227* 200*   Lipid Profile: Recent Labs    12/27/23 0433  TRIG 53   Thyroid Function Tests: No results for input(s): TSH, T4TOTAL, FREET4, T3FREE, THYROIDAB in the last 72 hours. Anemia Panel: No results for input(s): VITAMINB12, FOLATE, FERRITIN, TIBC, IRON, RETICCTPCT in the last 72 hours. Sepsis Labs: No results for input(s): PROCALCITON, LATICACIDVEN in the last 168 hours.  No results found for this or any previous visit (from the past 240 hours).        Radiology Studies: DG CHEST PORT 1 VIEW Result Date: 12/28/2023 CLINICAL DATA:  [id number] Dyspnea [id number] EXAM: PORTABLE CHEST - 1 VIEW COMPARISON:  December 26, 2023 FINDINGS: Lower lung volumes. Central pulmonary vascular congestion. No pneumothorax. Small left pleural effusion with patchy left airspace opacities. Mild cardiomegaly. Tortuous aorta with  aortic atherosclerosis. Multilevel thoracic osteophytosis. IMPRESSION: 1. Low lung volumes. Similar small left pleural effusion with patchy left basilar airspace opacities, likely atelectasis. 2. Unchanged cardiomegaly. Electronically Signed   By: Rogelia Myers M.D.   On: 12/28/2023 16:37        Scheduled Meds:  carvedilol   25 mg Oral BID WC   Chlorhexidine  Gluconate Cloth  6 each Topical Daily   feeding supplement  237 mL Oral BID BM   finasteride   5 mg Oral Daily   Gerhardt's butt cream   Topical BID   heparin  injection (subcutaneous)  5,000 Units Subcutaneous Q8H   hydrALAZINE   50 mg Oral Q6H   insulin  aspart  0-20 Units Subcutaneous TID WC   insulin  aspart  0-5 Units Subcutaneous QHS   levalbuterol   1.25 mg Nebulization Q6H   lisinopril   10 mg Oral Daily   methylPREDNISolone  (SOLU-MEDROL ) injection  40 mg Intravenous Q12H   multivitamin with minerals  1 tablet Oral Daily   pantoprazole   40 mg Oral BID AC   polyethylene glycol  17 g Oral Daily   Continuous Infusions:  ampicillin -sulbactam (UNASYN ) IV 1.5 g (12/29/23 0902)   TPN CYCLIC-ADULT (ION)            Sophie Mao, MD [facility] 12/29/2023, 1:38 PM

## 2023-12-29 NOTE — Progress Notes (Signed)
 Nutrition Brief Note  Consult received for initiation of calorie count to assess ability to discontinue TPN. Reached out to RN to retrieve all meal tickets with percent consumed and place in envelope to be hung on patient's door.   RD to obtain meal tickets next date to assess adequacy of oral intake.   Please reach out in the meantime with additional nutrition related concerns.   Allie Jodeci Roarty, RDN, LDN Clinical Nutrition See AMiON for contact information.

## 2023-12-30 DIAGNOSIS — I7100 Dissection of unspecified site of aorta: Secondary | ICD-10-CM | POA: Diagnosis not present

## 2023-12-30 LAB — COMPREHENSIVE METABOLIC PANEL WITH GFR
ALT: 42 U/L (ref 0–44)
AST: 34 U/L (ref 15–41)
Albumin: 2 g/dL — ABNORMAL LOW (ref 3.5–5.0)
Alkaline Phosphatase: 90 U/L (ref 38–126)
Anion gap: 10 (ref 5–15)
BUN: 35 mg/dL — ABNORMAL HIGH (ref 8–23)
CO2: 21 mmol/L — ABNORMAL LOW (ref 22–32)
Calcium: 8.1 mg/dL — ABNORMAL LOW (ref 8.9–10.3)
Chloride: 108 mmol/L (ref 98–111)
Creatinine, Ser: 1.41 mg/dL — ABNORMAL HIGH (ref 0.61–1.24)
GFR, Estimated: 52 mL/min — ABNORMAL LOW (ref >60)
Glucose, Bld: 235 mg/dL — ABNORMAL HIGH (ref 70–99)
Potassium: 4.3 mmol/L (ref 3.5–5.1)
Sodium: 139 mmol/L (ref 135–145)
Total Bilirubin: 0.6 mg/dL (ref 0.0–1.2)
Total Protein: 5.9 g/dL — ABNORMAL LOW (ref 6.5–8.1)

## 2023-12-30 LAB — CBC WITH DIFFERENTIAL/PLATELET
Abs Immature Granulocytes: 0.08 K/uL — ABNORMAL HIGH (ref 0.00–0.07)
Basophils Absolute: 0 K/uL (ref 0.0–0.1)
Basophils Relative: 0 %
Eosinophils Absolute: 0 K/uL (ref 0.0–0.5)
Eosinophils Relative: 0 %
HCT: 26 % — ABNORMAL LOW (ref 39.0–52.0)
Hemoglobin: 8 g/dL — ABNORMAL LOW (ref 13.0–17.0)
Immature Granulocytes: 1 %
Lymphocytes Relative: 6 %
Lymphs Abs: 0.7 K/uL (ref 0.7–4.0)
MCH: 27.9 pg (ref 26.0–34.0)
MCHC: 30.8 g/dL (ref 30.0–36.0)
MCV: 90.6 fL (ref 80.0–100.0)
Monocytes Absolute: 0.6 K/uL (ref 0.1–1.0)
Monocytes Relative: 6 %
Neutro Abs: 9.8 K/uL — ABNORMAL HIGH (ref 1.7–7.7)
Neutrophils Relative %: 87 %
Platelets: 262 K/uL (ref 150–400)
RBC: 2.87 MIL/uL — ABNORMAL LOW (ref 4.22–5.81)
RDW: 17.1 % — ABNORMAL HIGH (ref 11.5–15.5)
WBC: 11.2 K/uL — ABNORMAL HIGH (ref 4.0–10.5)
nRBC: 0.5 % — ABNORMAL HIGH (ref 0.0–0.2)

## 2023-12-30 LAB — GLUCOSE, CAPILLARY
Glucose-Capillary: 221 mg/dL — ABNORMAL HIGH (ref 70–99)
Glucose-Capillary: 117 mg/dL — ABNORMAL HIGH (ref 70–99)
Glucose-Capillary: 191 mg/dL — ABNORMAL HIGH (ref 70–99)
Glucose-Capillary: 150 mg/dL — ABNORMAL HIGH (ref 70–99)

## 2023-12-30 LAB — PHOSPHORUS: Phosphorus: 3.8 mg/dL (ref 2.5–4.6)

## 2023-12-30 LAB — MAGNESIUM: Magnesium: 2.1 mg/dL (ref 1.7–2.4)

## 2023-12-30 MED ORDER — FUROSEMIDE 10 MG/ML IJ SOLN
40.0000 mg | Freq: Once | INTRAMUSCULAR | Status: AC
  Administered 2023-12-30: 40 mg via INTRAVENOUS
  Filled 2023-12-30: qty 4

## 2023-12-30 MED ORDER — TRAVASOL 10 % IV SOLN
INTRAVENOUS | Status: DC
  Filled 2023-12-30: qty 1027.14

## 2023-12-30 MED ORDER — TRAVASOL 10 % IV SOLN: INTRAVENOUS | Status: DC

## 2023-12-30 MED ORDER — LEVALBUTEROL HCL 1.25 MG/0.5ML IN NEBU
1.2500 mg | INHALATION_SOLUTION | Freq: Three times a day (TID) | RESPIRATORY_TRACT | Status: DC
  Administered 2023-12-30 – 2023-12-31 (×4): 1.25 mg via RESPIRATORY_TRACT
  Filled 2023-12-30 (×6): qty 0.5

## 2023-12-30 NOTE — Progress Notes (Addendum)
 PROGRESS NOTE    Nicholas Burke  FMW:[id number] DOB: 1945/12/04 DOA: 12/06/2023 PCP: Hugh Charleston, MD (Inactive)   Brief Narrative:  78 y.o. male past medical history significant for prediabetes mellitus, essential hypertension, prostate cancer presented with epigastric pain. CT angio of the chest and abdomen revealed extensive intramural hematoma beginning at the transverse aorta origin.  Vascular surgery was consulted.  Hospital course as below.  Significant events: 8/11 presented with epigastric abdominal pain found to have large intramural hematoma 8/14 remains on esmolol , no indication for urgent surgical intervention 8/15 ongoing issues with ileus 8/16 NGT placed to decompress 8/19 continued high output from NGT. Still on Cleviprex  and esmolol . Seen By GI in consult for melanotic stools. Stopped CCB due to risk of constipation. Started TPN, PICC placed. 8/20 new tremor stopped the reglan . Still trying to get control of BP and HR. Started scheduled IV hydral + esmolol . 8/21 Patient became hypotensive, clonidine  patch was removed, hydralazine , esmolol  were stopped 8/22 on low-dose labetalol  infusion 8/23 had multiple small bowel movements, denies abdominal pain, NG output was 550 cc 8/24 BMs  8/25 hgb drop 5.3, 2 PRBC. AKI  8/25 EGD - Normal larynx. - Tiny hiatal hernia. - Clotted blood in the gastric fundus and in the proximal gastric body. - Normal duodenal bulb, first portion of the duodenum, second portion of the duodenum 8/25 CTA chest/abd/pelvis>>1. No significant change in the previously demonstrated intramural hematoma extending from the origin of the left subclavian artery into the abdomen and right common and external iliac arteries.  Interval increase in aneurysmal dilatation of the proximal descending thoracic aorta, measuring 4.9 cm in diameter. Dilatation of the stomach and mild dilatation of the proximal and mid small bowel loops with some low-density wall thickening in the  pelvis. 8/29 p.o. antihypertensive added 8/31 repeat EGD showing multiple stomach ulcers, nonbleeding  Assessment & Plan:   Type B aortic dissection with intramural aortic hematoma -Managed conservatively as per vascular surgery recommendations.  Blood pressure goal is SBP of less than 140 and heart rate of less than 80 - Initially treated with IV antihypertensive infusion: Subsequently discontinued - Currently on oral Coreg , hydralazine .  Lisinopril  dose has been increased to 20 mg daily from today onwards.  Blood pressure is better compared to yesterday.  Received 1 dose of IV Lasix  yesterday.  Will give another dose of IV Lasix  today. - Repeat CTA showed no significant changes of intramural hematoma and type B dissection.  Renal artery duplex was unremarkable - Outpatient follow-up with vascular surgery  Acute blood loss anemia/upper GI bleeding Gastric ulcers -Has required 5 units packed red cell transfusion during this hospitalization.  Hemoglobin 8 today.  Monitor H&H intermittently. - EGD on 12/20/2023 had shown multiple gastric ulcers without hemorrhage and erythematous gastric mucosa.  Patient had repeat EGD on 12/26/2023 which showed nonbleeding gastric ulcers with a clean ulcer base which was biopsied and erythematous mucosa in the gastric body and antrum, biopsied.  GI recommending to continue PPI twice daily for 2 months.  Pathology pending.  Acute respiratory with hypoxia Possible aspiration pneumonia - Chest x-ray on 12/28/23 had shown patchy left basilar airspace opacities, likely atelectasis.  He was started on IV Unasyn  and Solu-Medrol  along with nebs. -Diet as per SLP recommendations.  - Requiring 1L oxygen via nasal cannula currently.  Wean off as able  Refractory ileus Refeeding syndrome Moderate protein calorie malnutrition Hypoalbuminemia -Currently on TPN.  Completed course of erythromycin  to stimulate gut motility.  Ileus has improved.  Currently having bowel  movements. -Undergoing calorie count: Dietitian following.  Wean off TPN if possible.  Encourage oral intake. - Encourage ambulation.  Continue to work with PT/OT  Acute kidney injury - Resolved.  Monitor  Acute metabolic acidosis - Resolved.  Monitor.  Diabetes mellitus type 2 with hyperglycemia - Continue CBGs with SSI.    History of prostate cancer BPH - Continue finasteride .  Outpatient follow-up with urology  Obesity class I - Outpatient follow-up   DVT prophylaxis: Heparin  subcutaneous Code Status: Full Family Communication: None at bedside. Called wife today, went straight to voicemail Disposition Plan: Status is: Inpatient Remains inpatient appropriate because: Of severity of illness  Consultants: Vascular surgery/PCCM/nephrology/GI/neurology  Procedures: As above  Antimicrobials:  Anti-infectives (From admission, onward)    Start     Dose/Rate Route Frequency Ordered Stop   12/28/23 1330  doxycycline (VIBRAMYCIN) 100 mg in sodium chloride  0.9 % 250 mL IVPB  Status:  Discontinued        100 mg 125 mL/hr over 120 Minutes Intravenous Every 12 hours 12/28/23 1231 12/28/23 1232   12/28/23 1330  ampicillin -sulbactam (UNASYN ) 1.5 g in sodium chloride  0.9 % 100 mL IVPB        1.5 g 200 mL/hr over 30 Minutes Intravenous Every 6 hours 12/28/23 1232     12/25/23 1400  erythromycin  250 mg in sodium chloride  0.9 % 100 mL IVPB        250 mg 100 mL/hr over 60 Minutes Intravenous Every 8 hours 12/25/23 1307 12/27/23 0636   12/20/23 1245  Ampicillin -Sulbactam (UNASYN ) 3 g in sodium chloride  0.9 % 100 mL IVPB        3 g 200 mL/hr over 30 Minutes Intravenous Every 6 hours 12/20/23 1147 12/25/23 0655   12/19/23 0945  erythromycin  250 mg in sodium chloride  0.9 % 100 mL IVPB        250 mg 100 mL/hr over 60 Minutes Intravenous Every 6 hours 12/19/23 0937 12/21/23 0649   12/17/23 1200  erythromycin  250 mg in sodium chloride  0.9 % 100 mL IVPB  Status:  Discontinued        250  mg 100 mL/hr over 60 Minutes Intravenous Every 6 hours 12/17/23 0910 12/17/23 0913   12/17/23 1000  erythromycin  250 mg in sodium chloride  0.9 % 100 mL IVPB        250 mg 100 mL/hr over 60 Minutes Intravenous Every 8 hours 12/17/23 0913 12/19/23 0713        Subjective: Patient seen and examined at bedside.  Denies worsening abdominal pain, fever or vomiting.  Had bowel movement yesterday.  No bowel movement this morning. Objective: Vitals:   12/30/23 0511 12/30/23 0600 12/30/23 0729 12/30/23 0814  BP: (!) 164/86 (!) 150/91 (!) 142/96   Pulse: 82 86 86 87  Resp: 20 (!) 24 (!) 24 (!) 22  Temp: 98.2 F (36.8 C)  (!) 97.3 F (36.3 C)   TempSrc: Oral  Oral   SpO2: 98% 93% 96%   Weight:      Height:        Intake/Output Summary (Last 24 hours) at 12/30/2023 0823 Last data filed at 12/30/2023 0600 Gross per 24 hour  Intake 3702.86 ml  Output --  Net 3702.86 ml   Filed Weights   12/27/23 0645 12/28/23 0500 12/30/23 0431  Weight: 98.1 kg 101.8 kg 103.9 kg    Examination:  General: On 1 L oxygen by nasal cannula.  No distress.  Chronically ill and deconditioned looking.  ENT/neck: No thyromegaly.  JVD is not elevated  respiratory: Decreased breath sounds at bases bilaterally with some crackles; no wheezing.  Intermittently tachypneic  CVS: S1-S2 heard, rate controlled currently Abdominal: Soft, nontender, slightly distended; no organomegaly, bowel sounds are heard Extremities: Mild lower extremity edema; no cyanosis  CNS: Awake and alert.  Remains slow to respond.  No focal neurologic deficit.  Moves extremities Lymph: No obvious lymphadenopathy Skin: No obvious ecchymosis/lesions  psych: Affect is mostly flat.  Not agitated currently. musculoskeletal: No obvious joint swelling/deformity     Data Reviewed: I have personally reviewed following labs and imaging studies  CBC: Recent Labs  Lab 12/24/23 0526 12/25/23 0326 12/26/23 0140 12/27/23 0433 12/30/23 0459  WBC  7.4 8.2 8.4 10.2 11.2*  NEUTROABS  --   --   --   --  9.8*  HGB 7.5* 7.8* 7.6* 8.0* 8.0*  HCT 23.0* 24.6* 24.8* 25.8* 26.0*  MCV 93.9 91.1 92.9 91.8 90.6  PLT 262 259 258 279 262   Basic Metabolic Panel: Recent Labs  Lab 12/23/23 0905 12/24/23 0620 12/26/23 1559 12/27/23 0433 12/28/23 0517 12/29/23 0424 12/30/23 0459  NA 142   < > 141 142 143 141 139  K 3.9   < > 4.9 4.6 4.3 4.8 4.3  CL 113*   < > 115* 111 111 110 108  CO2 22   < > 18* 22 23 22  21*  GLUCOSE 185*   < > 190* 178* 150* 235* 235*  BUN 22   < > 21 24* 28* 32* 35*  CREATININE 1.29*   < > 1.36* 1.44* 1.22 1.24 1.41*  CALCIUM  7.6*   < > 7.7* 8.0* 8.1* 8.2* 8.1*  MG 1.9  --   --  2.2 2.3  --  2.1  PHOS 3.4  --   --  3.5 3.4  --  3.8   < > = values in this interval not displayed.   GFR: Estimated Creatinine Clearance: 53.8 mL/min (A) (by C-G formula based on SCr of 1.41 mg/dL (H)). Liver Function Tests: Recent Labs  Lab 12/23/23 0905 12/27/23 0433 12/28/23 0517 12/30/23 0459  AST 37 34  --  34  ALT 32 38  --  42  ALKPHOS 71 101  --  90  BILITOT 0.7 0.6  --  0.6  PROT 5.1* 6.2*  --  5.9*  ALBUMIN  1.7* 2.1* 2.1* 2.0*   No results for input(s): LIPASE, AMYLASE in the last 168 hours. No results for input(s): AMMONIA in the last 168 hours. Coagulation Profile: No results for input(s): INR, PROTIME in the last 168 hours. Cardiac Enzymes: No results for input(s): CKTOTAL, CKMB, CKMBINDEX, TROPONINI in the last 168 hours. BNP (last 3 results) No results for input(s): PROBNP in the last 8760 hours. HbA1C: No results for input(s): HGBA1C in the last 72 hours. CBG: Recent Labs  Lab 12/29/23 0621 12/29/23 1133 12/29/23 1617 12/29/23 2101 12/30/23 0552  GLUCAP 227* 200* 158* 193* 221*   Lipid Profile: No results for input(s): CHOL, HDL, LDLCALC, TRIG, CHOLHDL, LDLDIRECT in the last 72 hours.  Thyroid Function Tests: No results for input(s): TSH, T4TOTAL, FREET4,  T3FREE, THYROIDAB in the last 72 hours. Anemia Panel: No results for input(s): VITAMINB12, FOLATE, FERRITIN, TIBC, IRON, RETICCTPCT in the last 72 hours. Sepsis Labs: No results for input(s): PROCALCITON, LATICACIDVEN in the last 168 hours.  No results found for this or any previous visit (from the past 240 hours).       Radiology  Studies: DG CHEST PORT 1 VIEW Result Date: 12/28/2023 CLINICAL DATA:  [id number] Dyspnea [id number] EXAM: PORTABLE CHEST - 1 VIEW COMPARISON:  December 26, 2023 FINDINGS: Lower lung volumes. Central pulmonary vascular congestion. No pneumothorax. Small left pleural effusion with patchy left airspace opacities. Mild cardiomegaly. Tortuous aorta with aortic atherosclerosis. Multilevel thoracic osteophytosis. IMPRESSION: 1. Low lung volumes. Similar small left pleural effusion with patchy left basilar airspace opacities, likely atelectasis. 2. Unchanged cardiomegaly. Electronically Signed   By: Rogelia Myers M.D.   On: 12/28/2023 16:37        Scheduled Meds:  carvedilol   25 mg Oral BID WC   Chlorhexidine  Gluconate Cloth  6 each Topical Daily   feeding supplement  237 mL Oral BID BM   finasteride   5 mg Oral Daily   Gerhardt's butt cream   Topical BID   heparin  injection (subcutaneous)  5,000 Units Subcutaneous Q8H   hydrALAZINE   50 mg Oral Q6H   insulin  aspart  0-20 Units Subcutaneous TID WC   insulin  aspart  0-5 Units Subcutaneous QHS   levalbuterol   1.25 mg Nebulization Q6H   lisinopril   20 mg Oral Daily   methylPREDNISolone  (SOLU-MEDROL ) injection  40 mg Intravenous Daily   multivitamin with minerals  1 tablet Oral Daily   pantoprazole   40 mg Oral BID AC   polyethylene glycol  17 g Oral Daily   Continuous Infusions:  ampicillin -sulbactam (UNASYN ) IV 1.5 g (12/30/23 0150)   TPN CYCLIC-ADULT (ION) 60 mL/hr at 12/30/23 0800          Sophie Mao, MD [facility] 12/30/2023, 8:23 AM

## 2023-12-30 NOTE — Plan of Care (Signed)
  Problem: Education: Goal: Ability to describe self-care measures that may prevent or decrease complications (Diabetes Survival Skills Education) will improve Outcome: Progressing   Problem: Coping: Goal: Ability to adjust to condition or change in health will improve Outcome: Progressing   Problem: Fluid Volume: Goal: Ability to maintain a balanced intake and output will improve Outcome: Progressing. Diuresing well.

## 2023-12-30 NOTE — Progress Notes (Signed)
 Attempted to obtain information on flu vac. for this season; Patient sleeping.

## 2023-12-30 NOTE — Progress Notes (Signed)
 PRN Hydralazine  10 mg IV additionally from the Hydralazine  50 mg PO scheduled given earlier. We will continue to monitor.    12/30/23 0511  Vitals  Temp 98.2 F (36.8 C)  Temp Source Oral  BP (!) 164/86  MAP (mmHg) 109  BP Location Right Arm  BP Method Automatic  Patient Position (if appropriate) Lying  Pulse Rate 82  Pulse Rate Source Monitor  ECG Heart Rate 81  Resp 20  Level of Consciousness  Level of Consciousness Alert  MEWS COLOR  MEWS Score Color Green  Oxygen Therapy  SpO2 98 %  O2 Device Nasal Cannula  O2 Flow Rate (L/min) 1 L/min  Pain Assessment  Pain Scale 0-10  Pain Score 0   Wendi Dash, RN

## 2023-12-30 NOTE — Progress Notes (Signed)
 Pt is alert and fully oriented x 4, hemodynamically stable, afebrile, on room air, normal respiratory effort, no obvious acute distress noted. He is able to rest well with no major complaints overnight. Pt has low appetite, but slowly increasing oral intake. Denies nausea, vomiting or abdominal pain. He has had on TPN. Will check labs at am. Pt is able to ambulate independently. Plan of care is reviewed. Pt has been medically stable and progressing. We will continue to monitor.    Problem: Fluid Volume: Goal: Ability to maintain a balanced intake and output will improve Outcome: Progressing   Problem: Health Behavior/Discharge Planning: Goal: Ability to identify and utilize available resources and services will improve Outcome: Progressing Goal: Ability to manage health-related needs will improve Outcome: Progressing   Problem: Metabolic: Goal: Ability to maintain appropriate glucose levels will improve Outcome: Progressing   Problem: Nutritional: Goal: Maintenance of adequate nutrition will improve Outcome: Progressing Goal: Progress toward achieving an optimal weight will improve Outcome: Progressing   Problem: Skin Integrity: Goal: Risk for impaired skin integrity will decrease Outcome: Progressing   Problem: Tissue Perfusion: Goal: Adequacy of tissue perfusion will improve Outcome: Progressing   Problem: Clinical Measurements: Goal: Ability to maintain clinical measurements within normal limits will improve Outcome: Progressing Goal: Will remain free from infection Outcome: Progressing Goal: Diagnostic test results will improve Outcome: Progressing Goal: Respiratory complications will improve Outcome: Progressing Goal: Cardiovascular complication will be avoided Outcome: Progressing   Problem: Pain Managment: Goal: General experience of comfort will improve and/or be controlled Outcome: Progressing   Problem: Safety: Goal: Ability to remain free from injury will  improve Outcome: Progressing   Problem: Elimination: Goal: Will not experience complications related to bowel motility Outcome: Progressing   Wendi Dash, RN

## 2023-12-30 NOTE — Progress Notes (Addendum)
 PHARMACY - TOTAL PARENTERAL NUTRITION CONSULT NOTE  Indication: Prolonged ileus  Patient Measurements: Height: 5' 10 (177.8 cm) Weight: 103.9 kg (229 lb 0.9 oz) IBW/kg (Calculated) : 73 TPN AdjBW (KG): 90.7 Body mass index is 32.87 kg/m. Usual Weight: 91.2 kg   Assessment:  78 yo M admitted with aortic dissection with intramural hematoma. Patient developed severe ileus secondary to dissection. Without nutrition for 7 days and with high output from NGT at the start of TPN.   Patient reports he eats to live and does not eat much at home.  He typically eats breakfast and dinner and snacks at lunch time.  Glucose / Insulin : A1c 7.1% - CBGs 193-221 while on TPN, 158 off TPN Dexamethasone  x1 8/31, Methylpred started 9/2 Used 20 units SSI in past 24 hrs and 15 units in TPN  Electrolytes: K 4.3 (goal 4-4.5 per primary), others WNL Renal: SCr up 1.41 s/p Lasix , BUN up 35 Hepatic: LFTs / tbili / TG WNL, albumin  2 Intake / Output; MIVF: UOP not charted (last Lasix  9/3), LBM 9/3 8/31 neostigmine  x1 GI Imaging:  8/11 CT - extensive intramural hematoma involving the descending thoracic aorta extending throughout abd aorta and into R inflow vessels 8/14 DG - adynamic ileus 8/17 DG - persistent but improved gaseous gastric distension  8/19 DG - decr gaseous distention of stomach, persistent distension of small bowel  8/24 DG: persistent ileus  8/25 CT: enteritis with pSBO or gastric and small bowel ileus  GI Surgeries / Procedures:  8/25 EGD - minimal NG tube trauma, old clotted blood found in gastric fundus  8/31 EGD- non-bleeding gastric ulcers biopsied   Central access: PICC placed 12/14/23 TPN start date: 12/14/23  Nutritional Goals: Goal TPN rate is 80 mL/hr (provides 103g AA and 1906 kcals per day)  RD Estimated Needs Total Energy Estimated Needs: 1900-2100 kcals Total Protein Estimated Needs: 95-105 g Total Fluid Estimated Needs: >/= 2 L  Current Nutrition:  TPN - tolerated  16-hr cycle Ensure Plus High Protein BID - 2 charted given, pt reports 1 consumed on 9/3 8/31 regular diet - ate a bite or 2, no appetite and no taste 9/3 reports eating 1/2 of meals Magic cup TID - consumed 1/2 on 9/3 per pt  Plan: Continue 16-hr cyclic TPN (60-120 mL/hr with 1-hr taper up/down, GIR 1.47-2.95 mg/kg/min) to provide 100% of needs  Electrolytes in TPN: Na 25 mEq/L, K 51mEq/L, Ca 3 mEq/L, Mg 26mEq/L, Phos 14 mmol/L, Cl:Ac 1:2 - tweak lytes 9/2 (8/31 labs inconsistent) PO multivitamin daily (no MVI and trace elements in TPN) Increase to resistant SSI TIDwm and increase 20 units regular insulin  in TPN Monitor TPN labs on Mon/Thurs - in AM Monitor PO intake and calorie count started on 9/3 and ability to transition to enteral feeds  Thank you for involving pharmacy in this patient's care.  Delon Sax, PharmD, BCPS Clinical Pharmacist Clinical phone for 12/30/2023 is [phone] 12/30/2023 7:00 AM  Update: After discussing calorie count with RD, patient eating enough and tolerating - RD recommended to d/c TPN. Dr Cheryle ok with this plan.  Delon Sax, PharmD, BCPS 12:54 PM

## 2023-12-31 DIAGNOSIS — I7100 Dissection of unspecified site of aorta: Secondary | ICD-10-CM | POA: Diagnosis not present

## 2023-12-31 LAB — GLUCOSE, CAPILLARY
Glucose-Capillary: 116 mg/dL — ABNORMAL HIGH (ref 70–99)
Glucose-Capillary: 138 mg/dL — ABNORMAL HIGH (ref 70–99)
Glucose-Capillary: 186 mg/dL — ABNORMAL HIGH (ref 70–99)
Glucose-Capillary: 173 mg/dL — ABNORMAL HIGH (ref 70–99)

## 2023-12-31 LAB — BASIC METABOLIC PANEL WITH GFR
Anion gap: 5 (ref 5–15)
BUN: 39 mg/dL — ABNORMAL HIGH (ref 8–23)
CO2: 25 mmol/L (ref 22–32)
Calcium: 8 mg/dL — ABNORMAL LOW (ref 8.9–10.3)
Chloride: 111 mmol/L (ref 98–111)
Creatinine, Ser: 1.56 mg/dL — ABNORMAL HIGH (ref 0.61–1.24)
GFR, Estimated: 46 mL/min — ABNORMAL LOW (ref >60)
Glucose, Bld: 113 mg/dL — ABNORMAL HIGH (ref 70–99)
Potassium: 4.2 mmol/L (ref 3.5–5.1)
Sodium: 141 mmol/L (ref 135–145)

## 2023-12-31 LAB — MAGNESIUM: Magnesium: 2 mg/dL (ref 1.7–2.4)

## 2023-12-31 MED ORDER — FUROSEMIDE 10 MG/ML IJ SOLN
40.0000 mg | Freq: Once | INTRAMUSCULAR | Status: AC
  Administered 2023-12-31: 40 mg via INTRAVENOUS
  Filled 2023-12-31: qty 4

## 2023-12-31 NOTE — Plan of Care (Signed)
 Pt is pleasant, alert and fully oriented x 4, hemodynamically stable, afebrile, on room air, normal respiratory effort, no obvious acute distress noted. He is able to rest well with no major complaints overnight. Pt has good appetite today. Pt stated that he ate 100% with his wife's food preparing from home. Pt does not like hospital's food. He denies nausea, vomiting or abdominal pain. He has bowel movement today. D/C TPN today. Follow labs at am. Pt is able to ambulate independently with his wife x 4 rounds in hallway, well tolerated. Plan of care is reviewed. Pt is progressing. We will continue to monitor.   Problem: Health Behavior/Discharge Planning: Goal: Ability to identify and utilize available resources and services will improve Outcome: Progressing Goal: Ability to manage health-related needs will improve Outcome: Progressing   Problem: Metabolic: Goal: Ability to maintain appropriate glucose levels will improve Outcome: Progressing   Problem: Nutritional: Goal: Maintenance of adequate nutrition will improve Outcome: Progressing Goal: Progress toward achieving an optimal weight will improve Outcome: Progressing   Problem: Clinical Measurements: Goal: Ability to maintain clinical measurements within normal limits will improve Outcome: Progressing Goal: Will remain free from infection Outcome: Progressing Goal: Diagnostic test results will improve Outcome: Progressing Goal: Respiratory complications will improve Outcome: Progressing Goal: Cardiovascular complication will be avoided Outcome: Progressing   Wendi Dash, RN

## 2023-12-31 NOTE — TOC Progression Note (Signed)
 Transition of Care (TOC) - Progression Note  Rayfield Gobble RN, BSN Inpatient Care Management Unit 4E- RN Case Manager See Treatment Team for direct phone #   Patient Details  Name: Nicholas Burke MRN: [id number] Date of Birth: 21-Aug-1945  Transition [facility] CM/SW Contact  Gobble, Rayfield Hurst, RN Phone Number: 12/31/2023, 2:57 PM  Clinical Narrative:    Noted consult by MD for outpt PT referral.   CM spoke with pt and wife who was also present at bedside.  Discussed outpt PT post discharge. Choice of location offered based on home address. Pt voiced he is agreeable to referral for outpt PT- and closest location is [facility] [address].   Pt voiced he has a 4-pronged cane at home if needed- declined any DME needs for discharge.   Wife will transport home.   Wife/pt did ask about nebulizer for home as pt has been receiving Neb treatments while here- and does not have nebulizer machine at home. Msg sent to MD regarding this question and he does not anticipate pt will go home on neb treatments. Should this change- pt will need order for nebulizer prior to discharge.   Referral made via epic to [facility] street outpt location for outpt PT- ref# [id number], info placed on AVS   CM to continue to follow for any further needs.   Expected Discharge Plan: Home w Home Health Services Barriers to Discharge: Continued Medical Work up               Expected Discharge Plan and Services In-house Referral: NA Discharge Planning Services: CM Consult Post Acute Care Choice: NA Living arrangements for the past 2 months: Single Family Home                 DME Arranged: N/A DME Agency: NA       HH Arranged: NA HH Agency: NA         Social Drivers of Health (SDOH) Interventions SDOH Screenings   Food Insecurity: No Food Insecurity (12/06/2023)  Housing: Low Risk  (12/06/2023)  Transportation Needs: No Transportation Needs (12/06/2023)  Utilities: Not At Risk (12/06/2023)   Social Connections: Socially Integrated (12/06/2023)  Tobacco Use: Low Risk  (12/26/2023)    Readmission Risk Interventions     No data to display

## 2023-12-31 NOTE — Plan of Care (Signed)
  Problem: Education: Goal: Ability to describe self-care measures that may prevent or decrease complications (Diabetes Survival Skills Education) will improve Outcome: Progressing   Problem: Coping: Goal: Ability to adjust to condition or change in health will improve Outcome: Progressing   Problem: Fluid Volume: Goal: Ability to maintain a balanced intake and output will improve Outcome: Progressing. Pt diuresing well.

## 2023-12-31 NOTE — Progress Notes (Signed)
 Calorie Count Note  48 hour calorie count ordered. Limited meal tray tickets obtained and limited documentation in diet summary during calorie count; however RN reported completion percentage to RD in person for each meal during calorie count. RN also reported supplement acceptance and supplements brought in by pt's wife. RN reports pt has made a lot of progress with appetite. Pt meeting his needs orally (132% kcal needs and 94% protein needs) and will no longer require nutrition support. Continue to encourage PO intake and acceptance of ONS (from supply or brought in by wife).   Diet: Regular Supplements: Ensure, Boost (brought in by wife from home), magic cups  Estimated Nutritional Needs:  Kcal:  1900-2100 kcals Protein:  95-105 g Fluid:  >/= 2 L  Day 1: 9/3 Breakfast: 15% chicken broth, crackers, juice, gelatin, fruit ice, grits, pudding (92 kcal, 1 g protein) Lunch: 75% tomato soup, salad, sandwich, soda, cake (685 kcal, 15 g protein) Dinner: 50% porkchop, roll, sweet potato, mac & cheese, green beans, beef broth, juice x 2, cake (573 kcal, 23 g protein) Supplements:  2 magic cups (580 kcal, 18 g protein), 1 Boost (250 kcal, 20 g protein)  Day 2: 9/4 Breakfast: 50% cheerios, bacon, french toast, juice (301 kcal, 10 g protein) Lunch: 75% tomato soup, salad, sandwich, soda, cake, banana (775 kcal, 23 g protein) Dinner: 100% salmon, corn, sweet potato, chips, gingerale, cake, graham crackers (920 kcal, 21 g protein) Supplements: 2 magic cups (580 kcal, 18 g protein), 1 Boost (250 kcal, 20 g protein)  2 day total: 5,006 kcal, 169 g protein  Average of 2 days Total intake: 2503 kcal (132% of minimum estimated needs)  85 protein (94% of minimum estimated needs)  Nutrition Dx:  Moderate Malnutrition related to acute illness (Type B aortic dissection with hematoma causing prolonged SB ileus) as evidenced by energy intake < 75% for > 7 days, mild fat depletion, mild muscle depletion;  ongoing.   Goal: Patient will meet greater than or equal to 90% of their needs; met with POs   Intervention:  Continue Ensure Plus High Protein po BID, each supplement provides 350 kcal and 20 grams of protein Add Magic cup TID with meals, each supplement provides 290 kcal and 9 grams of protein Continue MVI with minerals daily    Josette Glance, MS, RDN, LDN Clinical Dietitian I Please reach out via secure chat

## 2023-12-31 NOTE — Progress Notes (Signed)
 PROGRESS NOTE    Nicholas Burke  FMW:[id number] DOB: 12-May-1945 DOA: 12/06/2023 PCP: Hugh Charleston, MD (Inactive)   Brief Narrative:  78 y.o. male past medical history significant for prediabetes mellitus, essential hypertension, prostate cancer presented with epigastric pain. CT angio of the chest and abdomen revealed extensive intramural hematoma beginning at the transverse aorta origin.  Vascular surgery was consulted.  Hospital course as below.  Significant events: 8/11 presented with epigastric abdominal pain found to have large intramural hematoma 8/14 remains on esmolol , no indication for urgent surgical intervention 8/15 ongoing issues with ileus 8/16 NGT placed to decompress 8/19 continued high output from NGT. Still on Cleviprex  and esmolol . Seen By GI in consult for melanotic stools. Stopped CCB due to risk of constipation. Started TPN, PICC placed. 8/20 new tremor stopped the reglan . Still trying to get control of BP and HR. Started scheduled IV hydral + esmolol . 8/21 Patient became hypotensive, clonidine  patch was removed, hydralazine , esmolol  were stopped 8/22 on low-dose labetalol  infusion 8/23 had multiple small bowel movements, denies abdominal pain, NG output was 550 cc 8/24 BMs  8/25 hgb drop 5.3, 2 PRBC. AKI  8/25 EGD - Normal larynx. - Tiny hiatal hernia. - Clotted blood in the gastric fundus and in the proximal gastric body. - Normal duodenal bulb, first portion of the duodenum, second portion of the duodenum 8/25 CTA chest/abd/pelvis>>1. No significant change in the previously demonstrated intramural hematoma extending from the origin of the left subclavian artery into the abdomen and right common and external iliac arteries.  Interval increase in aneurysmal dilatation of the proximal descending thoracic aorta, measuring 4.9 cm in diameter. Dilatation of the stomach and mild dilatation of the proximal and mid small bowel loops with some low-density wall thickening in the  pelvis. 8/29 p.o. antihypertensive added 8/31 repeat EGD showing multiple stomach ulcers, nonbleeding  Assessment & Plan:   Type B aortic dissection with intramural aortic hematoma -Managed conservatively as per vascular surgery recommendations.  Blood pressure goal is SBP of less than 140 and heart rate of less than 80 - Initially treated with IV antihypertensive infusion: Subsequently discontinued - Currently on oral Coreg , hydralazine .  Lisinopril  dose has been increased to 20 mg daily from 12/30/2023 onwards.  Blood pressure still intermittently on the higher side.  Increase lisinopril  to 30 mg daily today if blood pressure continues to remain elevated. -Having good diuresis with IV Lasix  daily for the last 2 days.  Will give another dose of IV Lasix  today. - Repeat CTA showed no significant changes of intramural hematoma and type B dissection.  Renal artery duplex was unremarkable - Outpatient follow-up with vascular surgery  Acute blood loss anemia/upper GI bleeding Gastric ulcers -Has required 5 units packed red cell transfusion during this hospitalization.  Hemoglobin 8 today.  Monitor H&H intermittently. - EGD on 12/20/2023 had shown multiple gastric ulcers without hemorrhage and erythematous gastric mucosa.  Patient had repeat EGD on 12/26/2023 which showed nonbleeding gastric ulcers with a clean ulcer base which was biopsied and erythematous mucosa in the gastric body and antrum, biopsied.  GI recommending to continue PPI twice daily for 2 months.  Pathology pending.  Acute respiratory with hypoxia Possible aspiration pneumonia - Chest x-ray on 12/28/23 had shown patchy left basilar airspace opacities, likely atelectasis.  He was started on IV Unasyn  and Solu-Medrol  along with nebs.  Will complete 5-day course of antibiotics and steroids. -Diet as per SLP recommendations.  - Respiratory status improving: Currently on room air.  Refractory ileus  Refeeding syndrome Moderate protein  calorie malnutrition Hypoalbuminemia -Completed course of erythromycin  to stimulate gut motility.  Ileus has improved.  Currently having bowel movements. - TPN discontinued on 12/30/2023 as per dietary recommendations.  Encourage oral intake. - Encourage ambulation.  Continue to work with PT/OT  Acute kidney injury - Creatinine trending upwards.  Monitor.  Acute metabolic acidosis - Resolved.  Monitor.  Diabetes mellitus type 2 with hyperglycemia - Continue CBGs with SSI.    History of prostate cancer BPH - Continue finasteride .  Outpatient follow-up with urology  Obesity class I - Outpatient follow-up   DVT prophylaxis: Heparin  subcutaneous Code Status: Full Family Communication: None at bedside. Called wife on 12/30/2023; went straight to voicemail Disposition Plan: Status is: Inpatient Remains inpatient appropriate because: Of severity of illness.  Possible discharge in 1 to 2 days if remains stable.  Consultants: Vascular surgery/PCCM/nephrology/GI/neurology  Procedures: As above  Antimicrobials:  Anti-infectives (From admission, onward)    Start     Dose/Rate Route Frequency Ordered Stop   12/28/23 1330  doxycycline (VIBRAMYCIN) 100 mg in sodium chloride  0.9 % 250 mL IVPB  Status:  Discontinued        100 mg 125 mL/hr over 120 Minutes Intravenous Every 12 hours 12/28/23 1231 12/28/23 1232   12/28/23 1330  ampicillin -sulbactam (UNASYN ) 1.5 g in sodium chloride  0.9 % 100 mL IVPB        1.5 g 200 mL/hr over 30 Minutes Intravenous Every 6 hours 12/28/23 1232 01/02/24 1359   12/25/23 1400  erythromycin  250 mg in sodium chloride  0.9 % 100 mL IVPB        250 mg 100 mL/hr over 60 Minutes Intravenous Every 8 hours 12/25/23 1307 12/27/23 0636   12/20/23 1245  Ampicillin -Sulbactam (UNASYN ) 3 g in sodium chloride  0.9 % 100 mL IVPB        3 g 200 mL/hr over 30 Minutes Intravenous Every 6 hours 12/20/23 1147 12/25/23 0655   12/19/23 0945  erythromycin  250 mg in sodium chloride   0.9 % 100 mL IVPB        250 mg 100 mL/hr over 60 Minutes Intravenous Every 6 hours 12/19/23 0937 12/21/23 0649   12/17/23 1200  erythromycin  250 mg in sodium chloride  0.9 % 100 mL IVPB  Status:  Discontinued        250 mg 100 mL/hr over 60 Minutes Intravenous Every 6 hours 12/17/23 0910 12/17/23 0913   12/17/23 1000  erythromycin  250 mg in sodium chloride  0.9 % 100 mL IVPB        250 mg 100 mL/hr over 60 Minutes Intravenous Every 8 hours 12/17/23 0913 12/19/23 0713        Subjective: Patient seen and examined at bedside.  Denies worsening shortness of breath, abdominal pain, vomiting.  Had bowel movement yesterday.  Eating better.   Objective: Vitals:   12/31/23 0405 12/31/23 0439 12/31/23 0500 12/31/23 0600  BP: (!) 170/104 (!) 146/90 (!) 147/86 (!) 150/94  Pulse: 79     Resp: 20 18 18 20   Temp: 97.6 F (36.4 C)     TempSrc: Oral     SpO2: 96%     Weight:  95.7 kg    Height:        Intake/Output Summary (Last 24 hours) at 12/31/2023 0801 Last data filed at 12/31/2023 0753 Gross per 24 hour  Intake 550 ml  Output 3300 ml  Net -2750 ml   Filed Weights   12/28/23 0500 12/30/23 0431 12/31/23 0439  Weight:  101.8 kg 103.9 kg 95.7 kg    Examination:  General: No acute distress.  On room air currently.  Chronically ill and deconditioned looking. ENT/neck: No JVD elevation or palpable neck masses respiratory: Bilateral decreased breath sounds at bases with crackles  CVS: Rate mostly controlled; S1 and S2 are heard Abdominal: Soft, nontender, distended mildly; no organomegaly, bowel sounds are heard normally Extremities: No clubbing; bilateral lower extremity edema present CNS: Alert and oriented.  No focal neurologic deficit.  Able to move extremities Lymph: No obvious palpable lymphadenopathy Skin: No obvious petechiae/rashes psych: Currently not agitated.  Flat affect mostly. musculoskeletal: No obvious joint tenderness/erythema     Data Reviewed: I have personally  reviewed following labs and imaging studies  CBC: Recent Labs  Lab 12/25/23 0326 12/26/23 0140 12/27/23 0433 12/30/23 0459  WBC 8.2 8.4 10.2 11.2*  NEUTROABS  --   --   --  9.8*  HGB 7.8* 7.6* 8.0* 8.0*  HCT 24.6* 24.8* 25.8* 26.0*  MCV 91.1 92.9 91.8 90.6  PLT 259 258 279 262   Basic Metabolic Panel: Recent Labs  Lab 12/27/23 0433 12/28/23 0517 12/29/23 0424 12/30/23 0459 12/31/23 0400  NA 142 143 141 139 141  K 4.6 4.3 4.8 4.3 4.2  CL 111 111 110 108 111  CO2 22 23 22  21* 25  GLUCOSE 178* 150* 235* 235* 113*  BUN 24* 28* 32* 35* 39*  CREATININE 1.44* 1.22 1.24 1.41* 1.56*  CALCIUM  8.0* 8.1* 8.2* 8.1* 8.0*  MG 2.2 2.3  --  2.1 2.0  PHOS 3.5 3.4  --  3.8  --    GFR: Estimated Creatinine Clearance: 46.8 mL/min (A) (by C-G formula based on SCr of 1.56 mg/dL (H)). Liver Function Tests: Recent Labs  Lab 12/27/23 0433 12/28/23 0517 12/30/23 0459  AST 34  --  34  ALT 38  --  42  ALKPHOS 101  --  90  BILITOT 0.6  --  0.6  PROT 6.2*  --  5.9*  ALBUMIN  2.1* 2.1* 2.0*   No results for input(s): LIPASE, AMYLASE in the last 168 hours. No results for input(s): AMMONIA in the last 168 hours. Coagulation Profile: No results for input(s): INR, PROTIME in the last 168 hours. Cardiac Enzymes: No results for input(s): CKTOTAL, CKMB, CKMBINDEX, TROPONINI in the last 168 hours. BNP (last 3 results) No results for input(s): PROBNP in the last 8760 hours. HbA1C: No results for input(s): HGBA1C in the last 72 hours. CBG: Recent Labs  Lab 12/30/23 0552 12/30/23 1118 12/30/23 1703 12/30/23 2101 12/31/23 0557  GLUCAP 221* 117* 191* 150* 116*   Lipid Profile: No results for input(s): CHOL, HDL, LDLCALC, TRIG, CHOLHDL, LDLDIRECT in the last 72 hours.  Thyroid Function Tests: No results for input(s): TSH, T4TOTAL, FREET4, T3FREE, THYROIDAB in the last 72 hours. Anemia Panel: No results for input(s): VITAMINB12, FOLATE,  FERRITIN, TIBC, IRON, RETICCTPCT in the last 72 hours. Sepsis Labs: No results for input(s): PROCALCITON, LATICACIDVEN in the last 168 hours.  No results found for this or any previous visit (from the past 240 hours).       Radiology Studies: No results found.       Scheduled Meds:  carvedilol   25 mg Oral BID WC   Chlorhexidine  Gluconate Cloth  6 each Topical Daily   feeding supplement  237 mL Oral BID BM   finasteride   5 mg Oral Daily   Gerhardt's butt cream   Topical BID   heparin  injection (subcutaneous)  5,000  Units Subcutaneous Q8H   hydrALAZINE   50 mg Oral Q6H   insulin  aspart  0-20 Units Subcutaneous TID WC   insulin  aspart  0-5 Units Subcutaneous QHS   levalbuterol   1.25 mg Nebulization TID   lisinopril   20 mg Oral Daily   methylPREDNISolone  (SOLU-MEDROL ) injection  40 mg Intravenous Daily   multivitamin with minerals  1 tablet Oral Daily   pantoprazole   40 mg Oral BID AC   polyethylene glycol  17 g Oral Daily   Continuous Infusions:  ampicillin -sulbactam (UNASYN ) IV 1.5 g (12/31/23 0144)          Sophie Mao, MD [facility] 12/31/2023, 8:01 AM

## 2023-12-31 NOTE — Progress Notes (Signed)
 Mobility Specialist Progress Note:   12/31/23 1055  Mobility  Activity Ambulated with assistance  Level of Assistance Modified independent, requires aide device or extra time  Assistive Device Other (Comment) (IV pole)  Distance Ambulated (ft) 300 ft  Activity Response Tolerated well  Mobility Referral Yes  Mobility visit 1 Mobility  Mobility Specialist Start Time (ACUTE ONLY) 1043  Mobility Specialist Stop Time (ACUTE ONLY) 1055  Mobility Specialist Time Calculation (min) (ACUTE ONLY) 12 min   Pt received in bed, eager for mobility session. Ambulated in hallway with ModI via IV pole for support. Tolerated well, asx throughout. Returned to room, lying in bed, daughter in room, all needs met.    Eland Lamantia Mobility Specialist Please contact via Special educational needs teacher or  Rehab office at [phone]

## 2024-01-01 ENCOUNTER — Other Ambulatory Visit (HOSPITAL_COMMUNITY): Payer: Self-pay

## 2024-01-01 DIAGNOSIS — I7100 Dissection of unspecified site of aorta: Secondary | ICD-10-CM | POA: Diagnosis not present

## 2024-01-01 LAB — CBC WITH DIFFERENTIAL/PLATELET
Abs Immature Granulocytes: 0.06 K/uL (ref 0.00–0.07)
Basophils Absolute: 0 K/uL (ref 0.0–0.1)
Basophils Relative: 0 %
Eosinophils Absolute: 0 K/uL (ref 0.0–0.5)
Eosinophils Relative: 0 %
HCT: 27.5 % — ABNORMAL LOW (ref 39.0–52.0)
Hemoglobin: 8.6 g/dL — ABNORMAL LOW (ref 13.0–17.0)
Immature Granulocytes: 1 %
Lymphocytes Relative: 12 %
Lymphs Abs: 1 K/uL (ref 0.7–4.0)
MCH: 28 pg (ref 26.0–34.0)
MCHC: 31.3 g/dL (ref 30.0–36.0)
MCV: 89.6 fL (ref 80.0–100.0)
Monocytes Absolute: 0.7 K/uL (ref 0.1–1.0)
Monocytes Relative: 8 %
Neutro Abs: 7 K/uL (ref 1.7–7.7)
Neutrophils Relative %: 79 %
Platelets: 253 K/uL (ref 150–400)
RBC: 3.07 MIL/uL — ABNORMAL LOW (ref 4.22–5.81)
RDW: 17.3 % — ABNORMAL HIGH (ref 11.5–15.5)
WBC: 8.9 K/uL (ref 4.0–10.5)
nRBC: 0.3 % — ABNORMAL HIGH (ref 0.0–0.2)

## 2024-01-01 LAB — BASIC METABOLIC PANEL WITH GFR
Anion gap: 7 (ref 5–15)
BUN: 37 mg/dL — ABNORMAL HIGH (ref 8–23)
CO2: 24 mmol/L (ref 22–32)
Calcium: 7.7 mg/dL — ABNORMAL LOW (ref 8.9–10.3)
Chloride: 111 mmol/L (ref 98–111)
Creatinine, Ser: 1.4 mg/dL — ABNORMAL HIGH (ref 0.61–1.24)
GFR, Estimated: 52 mL/min — ABNORMAL LOW (ref >60)
Glucose, Bld: 107 mg/dL — ABNORMAL HIGH (ref 70–99)
Potassium: 3.8 mmol/L (ref 3.5–5.1)
Sodium: 142 mmol/L (ref 135–145)

## 2024-01-01 LAB — MAGNESIUM: Magnesium: 1.9 mg/dL (ref 1.7–2.4)

## 2024-01-01 LAB — GLUCOSE, CAPILLARY
Glucose-Capillary: 98 mg/dL (ref 70–99)
Glucose-Capillary: 157 mg/dL — ABNORMAL HIGH (ref 70–99)

## 2024-01-01 MED ORDER — ALBUTEROL SULFATE HFA 108 (90 BASE) MCG/ACT IN AERS
2.0000 | INHALATION_SPRAY | RESPIRATORY_TRACT | 0 refills | Status: DC | PRN
  Filled 2024-01-01: qty 6.7, 16d supply, fill #0

## 2024-01-01 MED ORDER — CARVEDILOL 25 MG PO TABS
25.0000 mg | ORAL_TABLET | Freq: Two times a day (BID) | ORAL | 0 refills | Status: DC
  Filled 2024-01-01: qty 60, 30d supply, fill #0

## 2024-01-01 MED ORDER — LISINOPRIL 20 MG PO TABS
20.0000 mg | ORAL_TABLET | Freq: Every day | ORAL | 0 refills | Status: DC
  Filled 2024-01-01: qty 30, 30d supply, fill #0

## 2024-01-01 MED ORDER — HYDRALAZINE HCL 50 MG PO TABS
50.0000 mg | ORAL_TABLET | Freq: Four times a day (QID) | ORAL | 0 refills | Status: DC
  Filled 2024-01-01: qty 120, 30d supply, fill #0

## 2024-01-01 MED ORDER — POLYETHYLENE GLYCOL 3350 17 GM/SCOOP PO POWD
17.0000 g | Freq: Every day | ORAL | 0 refills | Status: AC
  Filled 2024-01-01: qty 238, 14d supply, fill #0

## 2024-01-01 MED ORDER — FINASTERIDE 5 MG PO TABS
5.0000 mg | ORAL_TABLET | Freq: Every day | ORAL | 0 refills | Status: DC
Start: 2024-01-01 — End: 2024-01-27
  Filled 2024-01-01: qty 30, 30d supply, fill #0

## 2024-01-01 MED ORDER — PANTOPRAZOLE SODIUM 40 MG PO TBEC
40.0000 mg | DELAYED_RELEASE_TABLET | Freq: Two times a day (BID) | ORAL | 1 refills | Status: AC
  Filled 2024-01-01: qty 60, 30d supply, fill #0
  Filled 2024-01-27: qty 60, 30d supply, fill #1

## 2024-01-01 NOTE — Plan of Care (Signed)
  Problem: Education: Goal: Ability to describe self-care measures that may prevent or decrease complications (Diabetes Survival Skills Education) will improve Outcome: Progressing Goal: Individualized Educational Video(s) Outcome: Progressing   Problem: Coping: Goal: Ability to adjust to condition or change in health will improve Outcome: Progressing   Problem: Fluid Volume: Goal: Ability to maintain a balanced intake and output will improve Outcome: Progressing   Problem: Health Behavior/Discharge Planning: Goal: Ability to identify and utilize available resources and services will improve Outcome: Progressing Goal: Ability to manage health-related needs will improve Outcome: Progressing   Problem: Metabolic: Goal: Ability to maintain appropriate glucose levels will improve Outcome: Progressing   Problem: Nutritional: Goal: Maintenance of adequate nutrition will improve Outcome: Progressing Goal: Progress toward achieving an optimal weight will improve Outcome: Progressing   Problem: Skin Integrity: Goal: Risk for impaired skin integrity will decrease Outcome: Progressing   Problem: Tissue Perfusion: Goal: Adequacy of tissue perfusion will improve Outcome: Progressing   Problem: Education: Goal: Knowledge of General Education information will improve Description: Including pain rating scale, medication(s)/side effects and non-pharmacologic comfort measures Outcome: Progressing   Problem: Health Behavior/Discharge Planning: Goal: Ability to manage health-related needs will improve Outcome: Progressing   Problem: Clinical Measurements: Goal: Ability to maintain clinical measurements within normal limits will improve Outcome: Progressing Goal: Will remain free from infection Outcome: Progressing Goal: Diagnostic test results will improve Outcome: Progressing Goal: Respiratory complications will improve Outcome: Progressing Goal: Cardiovascular complication will  be avoided Outcome: Progressing   Problem: Activity: Goal: Risk for activity intolerance will decrease Outcome: Progressing   Problem: Nutrition: Goal: Adequate nutrition will be maintained Outcome: Progressing   Problem: Coping: Goal: Level of anxiety will decrease Outcome: Progressing   Problem: Elimination: Goal: Will not experience complications related to bowel motility Outcome: Progressing   Problem: Pain Managment: Goal: General experience of comfort will improve and/or be controlled Outcome: Progressing   Problem: Safety: Goal: Ability to remain free from injury will improve Outcome: Progressing   Problem: Skin Integrity: Goal: Risk for impaired skin integrity will decrease Outcome: Progressing

## 2024-01-01 NOTE — Discharge Summary (Signed)
 Physician Discharge Summary  Nicholas Burke FMW:[id number] DOB: 1945-06-17 DOA: 12/06/2023  PCP: Hugh Charleston, MD (Inactive)  Admit date: 12/06/2023 Discharge date: 01/01/2024  Admitted From: Home Disposition: Home  Recommendations for Outpatient Follow-up:  Follow up with PCP in 1 week with repeat CBC/BMP Outpatient follow-up with vascular surgery, GI Follow up in ED if symptoms worsen or new appear   Home Health: No Equipment/Devices: None  Discharge Condition: Stable CODE STATUS: Full Diet recommendation: Heart healthy  Brief/Interim Summary: 78 y.o. male past medical history significant for prediabetes mellitus, essential hypertension, prostate cancer presented with epigastric pain. CT angio of the chest and abdomen revealed extensive intramural hematoma beginning at the transverse aorta origin.  Vascular surgery was consulted.  Hospital course as below.   Significant events: 8/11 presented with epigastric abdominal pain found to have large intramural hematoma 8/14 remains on esmolol , no indication for urgent surgical intervention 8/15 ongoing issues with ileus 8/16 NGT placed to decompress 8/19 continued high output from NGT. Still on Cleviprex  and esmolol . Seen By GI in consult for melanotic stools. Stopped CCB due to risk of constipation. Started TPN, PICC placed. 8/20 new tremor stopped the reglan . Still trying to get control of BP and HR. Started scheduled IV hydral + esmolol . 8/21 Patient became hypotensive, clonidine  patch was removed, hydralazine , esmolol  were stopped 8/22 on low-dose labetalol  infusion 8/23 had multiple small bowel movements, denies abdominal pain, NG output was 550 cc 8/24 BMs  8/25 hgb drop 5.3, 2 PRBC. AKI  8/25 EGD - Normal larynx. - Tiny hiatal hernia. - Clotted blood in the gastric fundus and in the proximal gastric body. - Normal duodenal bulb, first portion of the duodenum, second portion of the duodenum 8/25 CTA chest/abd/pelvis>>1. No  significant change in the previously demonstrated intramural hematoma extending from the origin of the left subclavian artery into the abdomen and right common and external iliac arteries.  Interval increase in aneurysmal dilatation of the proximal descending thoracic aorta, measuring 4.9 cm in diameter. Dilatation of the stomach and mild dilatation of the proximal and mid small bowel loops with some low-density wall thickening in the pelvis. 8/29 p.o. antihypertensive added 8/31 repeat EGD showing multiple stomach ulcers, nonbleeding  Subsequently, his condition has improved.  He is currently tolerating diet, having bowel movements.  Vascular surgery recommended outpatient follow-up.  He feels okay to go home today.  He will be discharged home today with outpatient follow-up with PCP, vascular surgery and GI.  Discharge Diagnoses:   Type B aortic dissection with intramural aortic hematoma -Managed conservatively as per vascular surgery recommendations.  Blood pressure goal is SBP of less than 140 and heart rate of less than 80 - Initially treated with IV antihypertensive infusion: Subsequently discontinued - Currently on oral Coreg , hydralazine .  Lisinopril  was initiated during the hospitalization but was held on 12/31/2022 due to slightly increased creatinine.  Creatinine improving today.  Will resume lisinopril  20 mg daily on discharge.  Continue current dose of Coreg  and hydralazine .  Blood pressure improving. -Received IV Lasix  daily for 3 days with good diuresis and improvement in lower extremity edema.  Will hold off on any more diuresis on discharge.  Patient needs to follow-up with PCP regarding the same. - Repeat CTA showed no significant changes of intramural hematoma and type B dissection.  Renal artery duplex was unremarkable - Outpatient follow-up with vascular surgery -Discharge patient home today.   Acute blood loss anemia/upper GI bleeding Gastric ulcers -Has required 5 units packed  red cell  transfusion during this hospitalization.  Hemoglobin 8.6 today.  Monitor H&H intermittently as an outpatient. - EGD on 12/20/2023 had shown multiple gastric ulcers without hemorrhage and erythematous gastric mucosa.  Patient had repeat EGD on 12/26/2023 which showed nonbleeding gastric ulcers with a clean ulcer base which was biopsied and erythematous mucosa in the gastric body and antrum, biopsied.  GI recommending to continue PPI twice daily for 2 months.  Pathology negative for H. pylori, dysplasia or metaplasia  - Outpatient follow-up with GI.  acute respiratory with hypoxia Possible aspiration pneumonia - Chest x-ray on 12/28/23 had shown patchy left basilar airspace opacities, likely atelectasis.  Currently on IV Unasyn  and Solu-Medrol  along with nebs.  Today is day #5 of Unasyn  and Solu-Medrol .  No need for any more antibiotics and steroids. -Diet as per SLP recommendations.  - Respiratory status improving: Currently on room air.   Refractory ileus Refeeding syndrome Moderate protein calorie malnutrition Hypoalbuminemia -Completed course of erythromycin  to stimulate gut motility.  Ileus has improved.  Currently having bowel movements. - TPN discontinued on 12/30/2023 as per dietary recommendations.  Oral intake is much improved. - Encourage ambulation.     Acute kidney injury - Creatinine improving.  Monitor as an outpatient.   Acute metabolic acidosis - Resolved.     Diabetes mellitus type 2 with hyperglycemia - Outpatient follow-up.  Not on any medications at home.    History of prostate cancer BPH - Continue finasteride .  Outpatient follow-up with urology   Obesity class I - Outpatient follow-up   Discharge Instructions  Discharge Instructions     Diet - low sodium heart healthy   Complete by: As directed    Increase activity slowly   Complete by: As directed       Allergies as of 01/01/2024   No Known Allergies      Medication List     STOP taking  these medications    diltiazem  240 MG 24 hr capsule Commonly known as: CARDIZEM  CD       TAKE these medications    albuterol  108 (90 Base) MCG/ACT inhaler Commonly known as: VENTOLIN  HFA Inhale 2 puffs into the lungs every 4 (four) hours as needed for wheezing or shortness of breath.   carvedilol  25 MG tablet Commonly known as: COREG  Take 1 tablet (25 mg total) by mouth 2 (two) times daily with a meal.   finasteride  5 MG tablet Commonly known as: PROSCAR  Take 1 tablet (5 mg total) by mouth daily.   hydrALAZINE  50 MG tablet Commonly known as: APRESOLINE  Take 1 tablet (50 mg total) by mouth every 6 (six) hours.   lisinopril  20 MG tablet Commonly known as: ZESTRIL  Take 1 tablet (20 mg total) by mouth daily.   pantoprazole  40 MG tablet Commonly known as: PROTONIX  Take 1 tablet (40 mg total) by mouth 2 (two) times daily before a meal.   polyethylene glycol 17 g packet Commonly known as: MIRALAX  / GLYCOLAX  Take 17 g by mouth daily. Start taking on: January 02, 2024   rosuvastatin  5 MG tablet Commonly known as: CRESTOR  Take 5 mg by mouth 3 (three) times a week.          Follow-up Information     [facility] Follow up in 1 month(s).   Specialty: Vascular Surgery Why: The office will call you with your appointment Contact information: [address] [place]  [address] [phone]  [facility] Follow up.   Specialty: Rehabilitation Why: referral made for outpt Physical therapy- they will contact you to schedule or you may call them if you have not heard from them within 7 days post discharge Contact information: [address] [place]  [address] [phone]        Dianna Specking, MD Follow up in 1 month(s).   Specialty: Gastroenterology Contact information: [address] [place] [address] [phone]                No Known Allergies  Consultations: Vascular surgery/GI/neurology/PCCM/nephrology   Procedures/Studies: DG CHEST PORT 1 VIEW Result Date: 12/28/2023 CLINICAL DATA:  [id number] Dyspnea [id number] EXAM: PORTABLE CHEST - 1 VIEW COMPARISON:  December 26, 2023 FINDINGS: Lower lung volumes. Central pulmonary vascular congestion. No pneumothorax. Small left pleural effusion with patchy left airspace opacities. Mild cardiomegaly. Tortuous aorta with aortic atherosclerosis. Multilevel thoracic osteophytosis. IMPRESSION: 1. Low lung volumes. Similar small left pleural effusion with patchy left basilar airspace opacities, likely atelectasis. 2. Unchanged cardiomegaly. Electronically Signed   By: Rogelia Myers M.D.   On: 12/28/2023 16:37   DG CHEST PORT 1 VIEW Result Date: 12/26/2023 CLINICAL DATA:  CHF EXAM: PORTABLE CHEST 1 VIEW COMPARISON:  Chest x-ray 12/21/2023 FINDINGS: Left upper extremity PICC terminates in the SVC. The aorta is ectatic. The heart is enlarged. There central pulmonary vascular congestion. There are minimal patchy opacities in the right mid lung and left lung base. There is no pleural effusion or pneumothorax. No acute fractures are seen. IMPRESSION: 1. Cardiomegaly with central pulmonary vascular congestion. 2. Minimal patchy opacities in the right mid lung and left lung base may represent atelectasis or infection. Electronically Signed   By: Greig Pique M.D.   On: 12/26/2023 01:55   DG Chest Port 1 View Result Date: 12/21/2023 CLINICAL DATA:  Hypoxia. EXAM: PORTABLE CHEST 1 VIEW COMPARISON:  December 19, 2023. FINDINGS: Stable cardiomediastinal silhouette. Right lung is clear. Minimal left basilar subsegmental atelectasis is noted with small pleural effusion. Left-sided PICC line is unchanged. Nasogastric tube has been removed. Bony thorax is unremarkable. IMPRESSION: Minimal left basilar subsegmental atelectasis with small left  pleural effusion. Electronically Signed   By: Lynwood Landy Raddle M.D.   On: 12/21/2023 09:23   CT Angio Chest/Abd/Pel for Dissection W and/or W/WO Result Date: 12/20/2023 CLINICAL DATA:  Follow-up intramural hematoma beginning at the origin of the left subclavian artery and extending inferiorly into the right common and external iliac arteries with associated aneurysmal dilatation of the descending thoracic aorta. Gastrointestinal bleeding. EXAM: CT ANGIOGRAPHY CHEST, ABDOMEN AND PELVIS TECHNIQUE: Non-contrast CT of the chest was initially obtained. Multidetector CT imaging through the chest, abdomen and pelvis was performed using the standard protocol during bolus administration of intravenous contrast. Multiplanar reconstructed images and MIPs were obtained and reviewed to evaluate the vascular anatomy. RADIATION DOSE REDUCTION: This exam was performed according to the departmental dose-optimization program which includes automated exposure control, adjustment of the mA and/or kV according to patient size and/or use of iterative reconstruction technique. CONTRAST:  75mL OMNIPAQUE  IOHEXOL  350 MG/ML SOLN COMPARISON:  12/08/2023 FINDINGS: CTA CHEST FINDINGS Cardiovascular: No significant change in the previously demonstrated intramural hematoma extending from the origin of the left subclavian artery into the abdomen. This continues to cause aneurysmal dilatation of the descending thoracic aorta, measuring 4.9 cm in the proximal descending thoracic aorta on image number 38/21. This measured 4.5 cm on 12/08/2023 and 4.3  cm on 12/06/2023. The heart remains near the upper limit of normal in size. No pericardial effusion. Atheromatous calcifications, including the coronary arteries and aorta. Mediastinum/Nodes: No enlarged mediastinal, hilar, or axillary lymph nodes. Thyroid gland, trachea, and esophagus demonstrate no significant findings. Lungs/Pleura: Interval small amount of patchy density in the posterior right upper  lobe. No significant change in bilateral lower lobe atelectasis. Small to moderate-sized left pleural effusion, increased. Interval small right pleural effusion. Musculoskeletal: Thoracic spine degenerative changes. Review of the MIP images confirms the above findings. CTA ABDOMEN AND PELVIS FINDINGS VASCULAR Aorta: The intramural hematoma extends throughout the abdominal aorta, without significant change in without aneurysm. Celiac: Patent without evidence of aneurysm, dissection, vasculitis or significant stenosis. SMA: Patent without evidence of aneurysm, dissection, vasculitis or significant stenosis. Renals: Both renal arteries are patent without evidence of aneurysm, dissection, vasculitis, fibromuscular dysplasia or significant stenosis. IMA: Stable non flow-limiting narrowing at the origin by the aortic intramural hematoma. Inflow: Stable intramural hematoma extending into the right common iliac artery and currently ending in the distal right common iliac artery. This previously ended in the right external iliac artery. Atheromatous calcifications. No significant luminal stenosis. Veins: No obvious venous abnormality within the limitations of this arterial phase study. Review of the MIP images confirms the above findings. NON-VASCULAR Hepatobiliary: No focal liver abnormality is seen. No gallstones, gallbladder wall thickening, or biliary dilatation. Pancreas: Unremarkable. No pancreatic ductal dilatation or surrounding inflammatory changes. Spleen: Normal in size without focal abnormality. Adrenals/Urinary Tract: Normal-appearing adrenal glands. Small simple appearing right renal cysts not requiring imaging follow-up. Unremarkable left kidney, ureters and urinary bladder. Mild bilateral perinephric edema. Stomach/Bowel: No contrast in the stomach or bowel to indicate active gastrointestinal bleeding. Interval dilatation of the stomach and mild dilatation of the proximal and mid small bowel loops with some  low-density wall thickening in the pelvis. The distal loops are normal in caliber as is the colon. No stool visible in the colon. There is fluid throughout the stomach, small bowel and colon. Normal-appearing appendix. Lymphatic: No enlarged lymph nodes. Reproductive: Stable surgical clips in the region of the prostate gland and minimal enlargement of the prostate gland. Other: No free peritoneal fluid. Diffuse subcutaneous edema. Large right inguinal hernia containing fat and fluid. Moderate-sized left inguinal hernia containing fat. Small umbilical hernia containing fat. Musculoskeletal: Lumbar spine degenerative changes. Review of the MIP images confirms the above findings. IMPRESSION: 1. No significant change in the previously demonstrated intramural hematoma extending from the origin of the left subclavian artery into the abdomen and right common and external iliac arteries. 2. Interval increase in aneurysmal dilatation of the proximal descending thoracic aorta, measuring 4.9 cm in diameter. 3. Interval small amount of patchy density in the posterior right upper lobe, compatible with pneumonia. 4. Interval small right pleural effusion and increased size of the previously demonstrated small left pleural effusion, currently small to moderate in size. 5. Interval dilatation of the stomach and mild dilatation of the proximal and mid small bowel loops with some low-density wall thickening in the pelvis. This is compatible with enteritis with partial small bowel obstruction or gastric and small-bowel ileus. 6. No evidence of active gastrointestinal bleeding. 7. Large right inguinal hernia containing fat and fluid, moderate-sized left inguinal hernia containing fat and small umbilical hernia containing fat. 8.  Calcific coronary artery and aortic atherosclerosis. Aortic Atherosclerosis (ICD10-I70.0). Electronically Signed   By: Elspeth Bathe M.D.   On: 12/20/2023 12:02   DG CHEST PORT 1 VIEW Result Date:  12/19/2023 CLINICAL DATA:  Pneumonia. EXAM: PORTABLE CHEST 1 VIEW COMPARISON:  Chest radiograph dated 12/14/2023. FINDINGS: Interval advancement of the left-sided PICC with tip close to cavoatrial junction. Enteric tube extends below the diaphragm with tip beyond the inferior margin of the image. Shallow inspiration. Left lung base atelectasis or pneumonia. No pleural effusion pneumothorax. Stable cardiac silhouette no acute osseous pathology. IMPRESSION: 1. Interval advancement of the left-sided PICC with tip close to cavoatrial junction. 2. Left lung base atelectasis or pneumonia. Electronically Signed   By: Vanetta Chou M.D.   On: 12/19/2023 13:45   DG Abd 1 View Result Date: 12/19/2023 CLINICAL DATA:  Ileus. EXAM: ABDOMEN - 1 VIEW COMPARISON:  Abdominal radiograph dated 12/14/2023. FINDINGS: Diffuse air distension of the small bowel relatively similar to prior radiograph. Degenerative changes of the spine. No acute osseous pathology. IMPRESSION: Persistent ileus. Electronically Signed   By: Vanetta Chou M.D.   On: 12/19/2023 13:44   VAS US  RENAL ARTERY DUPLEX Result Date: 12/14/2023 ABDOMINAL VISCERAL Patient Name:  TONATIUH MALLON  Date of Exam:   12/14/2023 Medical Rec #: [id number]   Accession #:    [id number] Date of Birth: 1945-11-26  Patient Gender: M Patient Age:   7 years Exam Location:  [facility] Procedure:      VAS US  RENAL ARTERY DUPLEX Referring Phys: [id number] LEITA SHAUNNA GASKINS -------------------------------------------------------------------------------- Indications: R/O aneurysm / renal artery stenosis High Risk Factors: Hypertension, hyperlipidemia, Diabetes, no history of                    smoking. Other Factors: Aortic dissection / intramural hematoma of thoracic aorta. Limitations: Air/bowel gas and poor patient positioning. Comparison Study: No previous renal artery duplex exams, however CTA of abdomen                   on 12/08/2023 was NEGATIVE for any acute findings,  stenosis, or                   occlusion of bilateral renal arteries Performing Technologist: Ezzie Potters RVT, RDMS  Examination Guidelines: A complete evaluation includes B-mode imaging, spectral Doppler, color Doppler, and power Doppler as needed of all accessible portions of each vessel. Bilateral testing is considered an integral part of a complete examination. Limited examinations for reoccurring indications may be performed as noted.  Duplex Findings: +--------------------+--------+--------+------+--------+ Mesenteric          PSV cm/sEDV cm/sPlaqueComments +--------------------+--------+--------+------+--------+ Aorta Prox             80      16                  +--------------------+--------+--------+------+--------+ Celiac Artery Origin  175      52                  +--------------------+--------+--------+------+--------+ SMA Proximal          127      17                  +--------------------+--------+--------+------+--------+    +------------------+--------+--------+-------+ Right Renal ArteryPSV cm/sEDV cm/sComment +------------------+--------+--------+-------+ Origin               70      17           +------------------+--------+--------+-------+ Proximal             49      12           +------------------+--------+--------+-------+  Mid                  45      11           +------------------+--------+--------+-------+ Distal               39      10           +------------------+--------+--------+-------+ +-----------------+--------+--------+--------+ Left Renal ArteryPSV cm/sEDV cm/sComment  +-----------------+--------+--------+--------+ Origin              61      14   tortuous +-----------------+--------+--------+--------+ Proximal            73      15   tortuous +-----------------+--------+--------+--------+ Mid                 59      11   tortuous +-----------------+--------+--------+--------+ Distal              53      11    tortuous +-----------------+--------+--------+--------+  Technologist observations: Difficulty imaging left kidney due to patient inability to roll onto side and overlying bowel. Difficulty imaging RRA due to venous interference. No evidence of renal artery aneurysm in visualized areas, bilaterally. +------------+--------+--------+----+-----------+--------+--------+----+ Right KidneyPSV cm/sEDV cm/sRI  Left KidneyPSV cm/sEDV cm/sRI   +------------+--------+--------+----+-----------+--------+--------+----+ Upper Pole  18      7       0.62Upper Pole 17      5       0.68 +------------+--------+--------+----+-----------+--------+--------+----+ Mid         23      7       0.        15      5       0.62 +------------+--------+--------+----+-----------+--------+--------+----+ Lower Pole  21      7       0.67Lower Pole 14      4       0.68 +------------+--------+--------+----+-----------+--------+--------+----+ Hilar       20      7       0.66Hilar      21      7       0.68 +------------+--------+--------+----+-----------+--------+--------+----+ +------------------+-----------------+------------------+-----------------+ Right Kidney                       Left Kidney                         +------------------+-----------------+------------------+-----------------+ RAR                                RAR                                 +------------------+-----------------+------------------+-----------------+ RAR (manual)                       RAR (manual)                        +------------------+-----------------+------------------+-----------------+ Cortex            16/6 cm/s RI 0.64Cortex            16/5 cm/s RI 0.66 +------------------+-----------------+------------------+-----------------+ Cortex thickness                   Corex  thickness                     +------------------+-----------------+------------------+-----------------+  Kidney length (cm)11.38            Kidney length (cm)10.60             +------------------+-----------------+------------------+-----------------+  Summary: Renal:  Right: No evidence of right renal artery stenosis. Normal right        Resisitive Index. Normal size right kidney. RRV flow present. Left:  No evidence of left renal artery stenosis. Normal left        Resistive Index. Normal size of left kidney. LRV flow        present. Left renal artery appears tortuous. Mesenteric: Normal Celiac artery and Superior Mesenteric artery findings.  *See table(s) above for measurements and observations.  Diagnosing physician: Penne Colorado MD  Electronically signed by Penne Colorado MD on 12/14/2023 at 6:03:50 PM.    Final    DG Chest Port 1 View Result Date: 12/14/2023 CLINICAL DATA:  PICC line placement. EXAM: PORTABLE CHEST 1 VIEW COMPARISON:  Radiographs 12/14/2023 and 12/10/2023.  CT 12/08/2023. FINDINGS: 1528 hours. A new left arm PICC projects over the T4 vertebral body, near the expected confluence of the brachiocephalic veins. Enteric tube projects below the diaphragm, tip overlying the mid stomach. The heart size and mediastinal contours are stable with aortic atherosclerosis. No significant change in small left pleural effusion and associated bibasilar pulmonary opacities. No evidence of pneumothorax or acute osseous abnormality. IMPRESSION: 1. New left arm PICC tip projects over the T4 vertebral body, near the expected confluence of the brachiocephalic veins. 2. No significant change in small left pleural effusion and bibasilar pulmonary opacities. Electronically Signed   By: Elsie Perone M.D.   On: 12/14/2023 15:38   DG CHEST PORT 1 VIEW Result Date: 12/14/2023 CLINICAL DATA:  [id number] Hypoxia [id number] EXAM: PORTABLE CHEST 1 VIEW COMPARISON:  Radiographs 12/10/2023 and 12/06/2023.  CT 12/08/2023. FINDINGS: 1017 hours. Mild patient rotation to the right. Enteric tube has been placed, projecting below the  diaphragm, tip overlying the mid stomach. The heart size and mediastinal contours are stable with aortic atherosclerosis. No significant change in small left pleural effusion and bibasilar airspace opacities. No evidence of pneumothorax. The bones appear unchanged. IMPRESSION: 1. Interval placement of enteric tube, tip overlying the mid stomach. 2. No significant change in small left pleural effusion and bibasilar airspace opacities. Electronically Signed   By: Elsie Perone M.D.   On: 12/14/2023 11:05   US  EKG SITE RITE Result Date: 12/14/2023 If Site Rite image not attached, placement could not be confirmed due to current cardiac rhythm.  DG Abd 1 View Result Date: 12/14/2023 CLINICAL DATA:  Ileus EXAM: ABDOMEN - 1 VIEW COMPARISON:  12/13/2023 FINDINGS: NG tube tip is in the stomach. Interval decrease in gaseous distention of the stomach. Diffuse gaseous distention of small bowel evident with small bowel loops measuring up to 4.4 cm diameter in the mid abdomen, similar time minimally progressive in the interval. Degenerative changes are noted in the lumbar spine and both hips. IMPRESSION: 1. Interval decrease in gaseous distention of the stomach. 2. Persistent diffuse gaseous distention of small bowel, similar to minimally progressive in the interval. Electronically Signed   By: Camellia Candle M.D.   On: 12/14/2023 07:09   DG Abd 1 View Result Date: 12/13/2023 CLINICAL DATA:  Ileus. EXAM: ABDOMEN - 1 VIEW COMPARISON:  Radiographs 12/12/2023 and 12/11/2023.  CT 12/08/2023. FINDINGS: 0800 hours. Two  supine views of the abdomen are submitted. Enteric tube projects over the mid stomach. Interval increased gaseous distention of the stomach. Multiple mildly dilated loops of small bowel have not significantly changed. The colon appears relatively decompressed. No supine evidence of pneumoperitoneum. No suspicious abdominal calcifications or acute osseous findings. IMPRESSION: Interval increased gaseous  distention of the stomach with persistent mildly dilated loops of small bowel, most consistent with an ileus based on recent prior imaging. Correlate clinically. Electronically Signed   By: Elsie Perone M.D.   On: 12/13/2023 10:58   DG Abd 1 View Result Date: 12/12/2023 CLINICAL DATA:  Ileus. EXAM: ABDOMEN - 1 VIEW COMPARISON:  Radiograph yesterday FINDINGS: Tip and side port of the enteric tube below the diaphragm in the stomach. Persistent but improved gaseous gastric distension. Gaseous small bowel distension measuring up to 5.5 cm. Minimal air in the colon. No significant formed colonic stool. IMPRESSION: 1. Tip and side port of the enteric tube below the diaphragm in the stomach. 2. Persistent but improved gaseous gastric distension. 3. Gaseous small bowel distension measuring up to 5.5 cm, may represent ileus or obstruction. Electronically Signed   By: Andrea Gasman M.D.   On: 12/12/2023 15:51   DG Abd 1 View Result Date: 12/11/2023 CLINICAL DATA:  NG tube placement EXAM: ABDOMEN - 1 VIEW COMPARISON:  12/10/2023 FINDINGS: NG tube tip in the mid stomach.  Gaseous distension of the stomach. IMPRESSION: NG tube in the stomach. Electronically Signed   By: Franky Crease M.D.   On: 12/11/2023 01:10   DG CHEST PORT 1 VIEW Result Date: 12/10/2023 CLINICAL DATA:  Hypoxia EXAM: PORTABLE CHEST 1 VIEW COMPARISON:  12/06/2023 CT chest 12/08/2023 FINDINGS: Hypoventilatory changes. Interval bilateral pleural effusions and basilar airspace disease. Suspect cardiomegaly. No pneumothorax IMPRESSION: Hypoventilatory change with interval bilateral pleural effusions and basilar airspace disease. Electronically Signed   By: Luke Bun M.D.   On: 12/10/2023 18:44   ECHOCARDIOGRAM COMPLETE Result Date: 12/10/2023    ECHOCARDIOGRAM REPORT   Patient Name:   MURLE OTTING Date of Exam: 12/10/2023 Medical Rec #:  [id number]  Height:       70.0 in Accession #:    [id number] Weight:       210.5 lb Date of Birth:  12/14/1945  BSA:          2.133 m Patient Age:    77 years   BP:           138/76 mmHg Patient Gender: M          HR:           71 bpm. Exam Location:  Inpatient Procedure: 2D Echo, Cardiac Doppler and Color Doppler (Both Spectral and Color            Flow Doppler were utilized during procedure). Indications:    Dissection abdominal aorta  History:        Patient has no prior history of Echocardiogram examinations.                 Prostate Cancer; Risk Factors:Hypertension.  Sonographer:    Benard Stallion Referring Phys: [id number] LAURA R GLEASON IMPRESSIONS  1. Left ventricular ejection fraction, by estimation, is 55 to 60%. The left ventricle has normal function. The left ventricle has no regional wall motion abnormalities. Left ventricular diastolic parameters are indeterminate.  2. Right ventricular systolic function is normal. The right ventricular size is normal.  3. The mitral valve is normal in structure. Trivial mitral valve regurgitation.  4. The aortic valve is tricuspid. Aortic valve regurgitation is mild. FINDINGS  Left Ventricle: Left ventricular ejection fraction, by estimation, is 55 to 60%. The left ventricle has normal function. The left ventricle has no regional wall motion abnormalities. The left ventricular internal cavity size was normal in size. There is  no left ventricular hypertrophy. Left ventricular diastolic parameters are indeterminate. Right Ventricle: The right ventricular size is normal. Right vetricular wall thickness was not assessed. Right ventricular systolic function is normal. Left Atrium: Left atrial size was normal in size. Right Atrium: Right atrial size was normal in size. Pericardium: There is no evidence of pericardial effusion. Mitral Valve: The mitral valve is normal in structure. Trivial mitral valve regurgitation. Tricuspid Valve: The tricuspid valve is normal in structure. Tricuspid valve regurgitation is mild. Aortic Valve: The aortic valve is tricuspid. Aortic valve  regurgitation is mild. Aortic valve mean gradient measures 1.5 mmHg. Aortic valve peak gradient measures 3.3 mmHg. Aortic valve area, by VTI measures 3.67 cm. Pulmonic Valve: The pulmonic valve was grossly normal. Pulmonic valve regurgitation is not visualized. Aorta: The aortic root is normal in size and structure. IAS/Shunts: No atrial level shunt detected by color flow Doppler.  LEFT VENTRICLE PLAX 2D LVIDd:         4.60 cm   Diastology LVIDs:         3.45 cm   LV e' medial:    4.57 cm/s LV PW:         0.97 cm   LV E/e' medial:  7.5 LV IVS:        1.00 cm   LV e' lateral:   6.31 cm/s LVOT diam:     2.27 cm   LV E/e' lateral: 5.4 LV SV:         65 LV SV Index:   30 LVOT Area:     4.05 cm  RIGHT VENTRICLE RV S prime:     14.70 cm/s TAPSE (M-mode): 2.2 cm LEFT ATRIUM             Index        RIGHT ATRIUM           Index LA Vol (A2C):   38.9 ml 18.23 ml/m  RA Area:     15.80 cm LA Vol (A4C):   37.6 ml 17.62 ml/m  RA Volume:   31.60 ml  14.81 ml/m LA Biplane Vol: 38.8 ml 18.19 ml/m  AORTIC VALVE AV Area (Vmax):    3.79 cm AV Area (Vmean):   3.67 cm AV Area (VTI):     3.67 cm AV Vmax:           90.55 cm/s AV Vmean:          60.600 cm/s AV VTI:            0.176 m AV Peak Grad:      3.3 mmHg AV Mean Grad:      1.5 mmHg LVOT Vmax:         84.90 cm/s LVOT Vmean:        55.000 cm/s LVOT VTI:          0.160 m LVOT/AV VTI ratio: 0.91  AORTA Ao Root diam: 3.80 cm Ao Asc diam:  3.16 cm MITRAL VALVE               TRICUSPID VALVE MV Area (PHT): 3.12 cm    TR Peak grad:   26.4 mmHg MV Decel Time: 243 msec  TR Vmax:        257.00 cm/s MV E velocity: 34.10 cm/s MV A velocity: 59.70 cm/s  SHUNTS MV E/A ratio:  0.57        Systemic VTI:  0.16 m                            Systemic Diam: 2.27 cm Vina Gull MD Electronically signed by Vina Gull MD Signature Date/Time: 12/10/2023/1:43:45 PM    Final    DG Abd 1 View Result Date: 12/10/2023 EXAM: 1 VIEW XRAY OF THE ABDOMEN 12/10/2023 05:56:00 AM COMPARISON: None available.  CLINICAL HISTORY: Abdominal distension; history of prostate cancer, hypertension, and pre-diabetes. FINDINGS: BOWEL: There has been marginal interval improvement of gaseous distention of the small bowel. The small bowel loops measure up to 3.9 cm in diameter on the current exam. There is a paucity of air within the rectum. SOFT TISSUES: No abnormal calcifications. No opaque urinary calculi. BONES: No acute osseous abnormality. IMPRESSION: 1. Marginal interval improvement of gaseous distention of the small bowel, with loops measuring up to 3.9 cm in diameter. Findings remaining consistent with near complete small bowel obstruction or severe ileus. Electronically signed by: evalene coho 12/10/2023 08:43 AM EDT RP Workstation: [id number]   DG Abd 1 View Result Date: 12/09/2023 CLINICAL DATA:  [id number] Abdominal pain [id number]. EXAM: ABDOMEN - 1 VIEW COMPARISON:  CT scan angiography chest, abdomen and pelvis from 12/25/2023. FINDINGS: There is increased amount of gas throughout the small bowel and colon, likely manifestation of adynamic ileus. Consider radiographic follow up if clinically indicated. No evidence of pneumoperitoneum, within the limitations of a supine film. No acute osseous abnormalities. The soft tissues are within normal limits. Surgical changes, devices, tubes and lines: None. IMPRESSION: There is increased amount of gas throughout the small bowel and colon, likely manifestation of adynamic ileus. Consider radiographic follow up if clinically indicated. Electronically Signed   By: Ree Molt M.D.   On: 12/09/2023 09:55   CT Angio Chest/Abd/Pel for Dissection W and/or W/WO Result Date: 12/08/2023 EXAM: CTA CHEST, ABDOMEN AND PELVIS WITHOUT AND WITH CONTRAST 12/08/2023 05:14:25 AM TECHNIQUE: CTA of the chest was performed without and with the administration of intravenous contrast. CTA of the abdomen and pelvis was performed without and with the administration of intravenous contrast.  Multiplanar reformatted images are provided for review. MIP images are provided for review. Automated exposure control, iterative reconstruction, and/or weight based adjustment of the mA/kV was utilized to reduce the radiation dose to as low as reasonably achievable. COMPARISON: 03/22/2023 CLINICAL HISTORY: Intramural hematoma. FINDINGS: VASCULATURE: AORTA: As noted previously there is an intramural hematoma involving the thoracic aorta begins at the level of the transverse aortic arch and the origin of the left subclavian artery. This extends throughout the descending thoracic aorta and into the abdominal aorta. There is no involvement of the great vessels of the arch. Aneurysmal dilatation of the proximal descending thoracic aorta measuring 4.5 cm, image 53/6. Previously, this was measured at 4.3 cm. No evidence of ascending thoracic aortic aneurysm or dissection. PULMONARY ARTERIES: The pulmonary arteries appear patent. No signs of acute pulmonary embolism. GREAT VESSELS OF AORTIC ARCH: No acute finding. No dissection. No arterial occlusion or significant stenosis. CELIAC TRUNK: No acute finding. No occlusion or significant stenosis. SUPERIOR MESENTERIC ARTERY: No acute finding. No occlusion or significant stenosis. INFERIOR MESENTERIC ARTERY: Unchanged non-flow limiting narrowing of the IMA ostium secondary to intramural hematoma. RENAL ARTERIES: No  acute finding. No occlusion or significant stenosis. ILIAC ARTERIES: Intramural hematoma extends into the right inflow vessels terminating in the mid right external iliac artery. Unchanged. CHEST: MEDIASTINUM: No mediastinal lymphadenopathy. The heart and pericardium demonstrate no acute abnormality. No pericardial effusion. Thyroid gland, trachea, and esophagus appear normal. LUNGS AND PLEURA: Emphysema with diffuse bronchial wall thickening. No pneumothorax. New small left pleural effusion with overlying airspace consolidation in the left lower lobe. with new  subsegmental atelectasis within the lingula and posterior medial right lower lobe . THORACIC BONES AND SOFT TISSUES: No acute bone or soft tissue abnormality. ABDOMEN AND PELVIS: LIVER: No focal liver abnormality. GALLBLADDER AND BILE DUCTS: Stones within the neck of the gallbladder are identified, which measure up to 7 mm. No gallbladder wall thickening or surrounding inflammation. SPLEEN: The spleen is unremarkable. PANCREAS: No pancreatic inflammation, main duct dilatation, or mass. ADRENAL GLANDS: Normal adrenal glands. KIDNEYS, URETERS AND BLADDER: No nephrolithiasis, hydronephrosis, or suspicious mass. Bladder appears normal. GI AND BOWEL: The appendix is visualized and is within normal limits. Colonic diverticulosis without signs of acute diverticulitis. REPRODUCTIVE: Fiducial markers identified within the prostate gland. PERITONEUM AND RETROPERITONEUM: No free fluid or fluid collections. LYMPH NODES: No signs of abdominal or pelvic adenopathy. ABDOMINAL BONES AND SOFT TISSUES: Small fat-containing umbilical hernia. Moderate-sized fat-containing right inguinal hernia with tenting of the right lateral urinary bladder dome into the hernia sac. IMPRESSION: 1. Intramural hematoma involving the thoracic aorta, extending into the abdominal aorta and right inflow vessels, with associated aneurysmal dilatation of the proximal descending thoracic aorta measuring 4.5 cm (previously 4.3 cm). 2. Unchanged narrowing of the IMA ostium secondary to intramural hematoma, non-flow limiting. 3. New small left pleural effusion with left lower lobe atelectasis and consolidation. There is also new partial atelectasis of the Lingula and right lower lobe. 4. Stones within the neck of the gallbladder, measuring up to 7 mm, without gallbladder wall thickening or surrounding inflammation. 5. Moderate-sized fat-containing right inguinal hernia with tenting of the right lateral urinary bladder dome into the hernia sac. Electronically  signed by: Waddell Calk MD 12/08/2023 06:24 AM EDT RP Workstation: [id number]   CT Angio Chest Aorta W and/or Wo Contrast Result Date: 12/06/2023 CLINICAL DATA:  Chest pain EXAM: CT ANGIOGRAPHY CHEST WITH CONTRAST TECHNIQUE: Multidetector CT imaging of the chest was performed using the standard protocol during bolus administration of intravenous contrast. Multiplanar CT image reconstructions and MIPs were obtained to evaluate the vascular anatomy. RADIATION DOSE REDUCTION: This exam was performed according to the departmental dose-optimization program which includes automated exposure control, adjustment of the mA and/or kV according to patient size and/or use of iterative reconstruction technique. CONTRAST:  75mL OMNIPAQUE  IOHEXOL  350 MG/ML SOLN COMPARISON:  Concurrently obtained but separately dictated CT scan of the abdomen and pelvis. FINDINGS: Cardiovascular: The aortic root is normal in caliber at three point at the sinuses of Valsalva. No evidence of ascending thoracic aortic aneurysm. No evidence of dissection in the ascending thoracic aorta. Conventional three vessel arch anatomy. Positive for acute intramural hematoma beginning at the origin of the left subclavian artery and extending through the aortic isthmus and descending thoracic aorta into the upper abdominal aorta. There is mild aneurysmal dilatation of the proximal descending thoracic aorta measuring up to 4.3 cm. The heart is normal in caliber. Calcifications present throughout the coronary arteries. Normal main pulmonary artery. No evidence of PE. Mediastinum/Nodes: No enlarged mediastinal, hilar, or axillary lymph nodes. Thyroid gland, trachea, and esophagus demonstrate no significant findings. Lungs/Pleura: Dependent atelectasis  bilaterally. No pleural effusion or hemothorax. No suspicious mass or nodule. Upper Abdomen: The acute intramural hematoma extends throughout the visualized upper abdominal aorta. Musculoskeletal: No acute fracture  or aggressive appearing lytic or blastic osseous lesion. Review of the MIP images confirms the above findings. IMPRESSION: 1. Positive for acute intramural hematoma beginning in the transverse aorta at the origin of the left subclavian artery and extending throughout the thoracic aorta and into the abdominal aorta. No evidence of involvement of the ascending aorta or aortic root. 2. 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 3. Atherosclerotic vascular calcifications in the aorta and coronary arteries. 4. Dependent atelectasis in both lower lobes. Aortic Atherosclerosis (ICD10-I70.0); Aortic aneurysm NOS (ICD10-I71.9). Electronically Signed   By: Wilkie Lent M.D.   On: 12/06/2023 15:20   CT ABDOMEN PELVIS W CONTRAST Result Date: 12/06/2023 CLINICAL DATA:  Bowel obstruction suspected EXAM: CT ABDOMEN AND PELVIS WITH CONTRAST TECHNIQUE: Multidetector CT imaging of the abdomen and pelvis was performed using the standard protocol following bolus administration of intravenous contrast. RADIATION DOSE REDUCTION: This exam was performed according to the departmental dose-optimization program which includes automated exposure control, adjustment of the mA and/or kV according to patient size and/or use of iterative reconstruction technique. CONTRAST:  OMNIPAQUE  IOHEXOL  300 MG/ML  SOLN COMPARISON:  May 11, 2024 FINDINGS: Lower chest: No focal airspace consolidation or pleural effusion.Posterior bibasilar dependent atelectasis. Hepatobiliary: No mass.A couple of small radiopaque gallstones. No wall thickening of the gallbladder. No intrahepatic or extrahepatic biliary ductal dilation. The portal veins are patent. Pancreas: No mass or main ductal dilation. No peripancreatic inflammation or fluid collection. Spleen: Normal size. No mass. Adrenals/Urinary Tract: No adrenal masses. A couple of subcentimeter hypodensities are noted in the kidneys,  too small to definitively characterize, but likely small cysts. No nephrolithiasis or hydronephrosis. The urinary bladder is completely decompressed. Stomach/Bowel: The stomach is decompressed without focal abnormality. No small bowel wall thickening or inflammation. No small bowel obstruction.Normal appendix. Vascular/Lymphatic: The descending thoracic aorta is dilated measuring 3.4 cm. There is extensive intramural hematoma extending from the descending thoracic aorta throughout the abdominal aorta and extending into the right common and proximal and mid external iliac arteries. No periaortic fluid collection. Diffuse aortoiliac atherosclerosis. The celiac, superior mesenteric, and both renal arteries are patent and well perfused. There is moderate narrowing of the IMA ostium from the intramural hematoma surrounding the vessel lumen. No intraabdominal or pelvic lymphadenopathy. Reproductive: No prostatomegaly.No free pelvic fluid. Other: No pneumoperitoneum, ascites, or mesenteric inflammation. Musculoskeletal: No acute fracture or destructive lesion. Multilevel degenerative disc disease of the spine. Thoracic DISH. IMPRESSION: 1. Extensive intramural hematoma involving the descending thoracic aorta extending throughout the abdominal aorta and into the right inflow vessels terminating in the mid right external iliac artery. While there is moderate narrowing of the IMA ostium from the intramural hematoma surrounding the vessel lumen, the remaining mesenteric and renal vessels are widely patent. A follow-up CTA of the chest is recommended to evaluate for the proximal extent of the hematoma. 2. Moderate-sized fat containing right inguinal hernia, with tenting of the right lateral urinary bladder dome into the hernia sac. No surrounding fluid or inflammation to suggest vascular compromise, at this time. 3. Cholecystolithiasis. Critical Value/emergent results were called by telephone at the time of interpretation on  12/06/2023 at 12:45 pm to provider [facility] who verbally acknowledged these results. Electronically Signed   By: Rogelia Carlean HERO.D.  On: 12/06/2023 12:57   DG Chest 2 View Result Date: 12/06/2023 EXAM: 2 VIEW(S) XRAY OF THE CHEST 12/06/2023 10:13:00 AM COMPARISON: PA and lateral radiographs of the chest dated 02/16/2019. CLINICAL HISTORY: Chest pain. Developed epigastric pain this morning, took gas with a little relief. Patient states he feels like it is gas. Describes as tightness across epigastric area. FINDINGS: LUNGS AND PLEURA: No focal pulmonary opacity. No pulmonary edema. No pleural effusion. No pneumothorax. HEART AND MEDIASTINUM: No acute abnormality of the cardiac and mediastinal silhouettes. BONES AND SOFT TISSUES: No acute osseous abnormality. IMPRESSION: 1. No acute process. Electronically signed by: evalene coho 12/06/2023 10:48 AM EDT RP Workstation: [id number]      Subjective: Patient seen and examined at bedside.  Feels better and wants to go home today.  Denies worsening shortness of breath, cough, abdominal pain or constipation.  Discharge Exam: Vitals:   01/01/24 0717 01/01/24 1053  BP: (!) 149/92 138/85  Pulse: 74 82  Resp: 20 17  Temp: 97.9 F (36.6 C) 97.9 F (36.6 C)  SpO2: 98%     General: Pt is alert, awake, not in acute distress.  On room air. Cardiovascular: rate controlled, S1/S2 + Respiratory: bilateral decreased breath sounds at bases Abdominal: Soft, NT, mildly distended, bowel sounds + Extremities: Trace lower extremity edema; no cyanosis    The results of significant diagnostics from this hospitalization (including imaging, microbiology, ancillary and laboratory) are listed below for reference.     Microbiology: No results found for this or any previous visit (from the past 240 hours).   Labs: BNP (last 3 results) No results for input(s): BNP in the last 8760 hours. Basic Metabolic Panel: Recent Labs  Lab 12/27/23 0433  12/28/23 0517 12/29/23 0424 12/30/23 0459 12/31/23 0400 01/01/24 0500  NA 142 143 141 139 141 142  K 4.6 4.3 4.8 4.3 4.2 3.8  CL 111 111 110 108 111 111  CO2 22 23 22  21* 25 24  GLUCOSE 178* 150* 235* 235* 113* 107*  BUN 24* 28* 32* 35* 39* 37*  CREATININE 1.44* 1.22 1.24 1.41* 1.56* 1.40*  CALCIUM  8.0* 8.1* 8.2* 8.1* 8.0* 7.7*  MG 2.2 2.3  --  2.1 2.0 1.9  PHOS 3.5 3.4  --  3.8  --   --    Liver Function Tests: Recent Labs  Lab 12/27/23 0433 12/28/23 0517 12/30/23 0459  AST 34  --  34  ALT 38  --  42  ALKPHOS 101  --  90  BILITOT 0.6  --  0.6  PROT 6.2*  --  5.9*  ALBUMIN  2.1* 2.1* 2.0*   No results for input(s): LIPASE, AMYLASE in the last 168 hours. No results for input(s): AMMONIA in the last 168 hours. CBC: Recent Labs  Lab 12/26/23 0140 12/27/23 0433 12/30/23 0459 01/01/24 0500  WBC 8.4 10.2 11.2* 8.9  NEUTROABS  --   --  9.8* 7.0  HGB 7.6* 8.0* 8.0* 8.6*  HCT 24.8* 25.8* 26.0* 27.5*  MCV 92.9 91.8 90.6 89.6  PLT 258 279 262 253   Cardiac Enzymes: No results for input(s): CKTOTAL, CKMB, CKMBINDEX, TROPONINI in the last 168 hours. BNP: Invalid input(s): POCBNP CBG: Recent Labs  Lab 12/31/23 1124 12/31/23 1732 12/31/23 2057 01/01/24 0626 01/01/24 1051  GLUCAP 138* 186* 173* 98 157*   D-Dimer No results for input(s): DDIMER in the last 72 hours. Hgb A1c No results for input(s): HGBA1C in the last 72 hours. Lipid Profile No results for input(s): CHOL, HDL, LDLCALC,  TRIG, CHOLHDL, LDLDIRECT in the last 72 hours. Thyroid function studies No results for input(s): TSH, T4TOTAL, T3FREE, THYROIDAB in the last 72 hours.  Invalid input(s): FREET3 Anemia work up No results for input(s): VITAMINB12, FOLATE, FERRITIN, TIBC, IRON, RETICCTPCT in the last 72 hours. Urinalysis    Component Value Date/Time   COLORURINE YELLOW 12/13/2023 1251   APPEARANCEUR CLEAR 12/13/2023 1251   LABSPEC 1.017  12/13/2023 1251   LABSPEC 1.005 01/16/2013 0953   PHURINE 6.0 12/13/2023 1251   GLUCOSEU NEGATIVE 12/13/2023 1251   GLUCOSEU Negative 01/16/2013 0953   HGBUR NEGATIVE 12/13/2023 1251   BILIRUBINUR NEGATIVE 12/13/2023 1251   BILIRUBINUR Negative 01/16/2013 0953   KETONESUR NEGATIVE 12/13/2023 1251   PROTEINUR NEGATIVE 12/13/2023 1251   UROBILINOGEN 0.2 01/16/2013 0953   NITRITE NEGATIVE 12/13/2023 1251   LEUKOCYTESUR NEGATIVE 12/13/2023 1251   LEUKOCYTESUR Trace 01/16/2013 0953   Sepsis Labs Recent Labs  Lab 12/26/23 0140 12/27/23 0433 12/30/23 0459 01/01/24 0500  WBC 8.4 10.2 11.2* 8.9   Microbiology No results found for this or any previous visit (from the past 240 hours).   Time coordinating discharge: 35 minutes  SIGNED:   Sophie Mao, MD  [facility] 01/01/2024, 11:06 AM

## 2024-01-01 NOTE — TOC CM/SW Note (Addendum)
 Referral received to assist with HHRN. Per Dr. Cheryle, pt's daughter is requesting [facility]. Per CM note, referral sent to oupt PT. Met with pt and daughter. Daughter reports that she would like HHRN to check pt's BP and meds. Explained to daughter that pt is not homebound and he doesn't qualify for [facility] services. Informed her that her father agreed to outpt PT. She verbalized understanding. She reports that she didn't know a referral was sent to outpt rehab. Pt reports that he has BP cuff and he checks his BP. Encouraged pt to continue to check his BP, to keep a log and bring the log to his doctors appointments. He verbalized understanding. Dr. Cheryle notified of above.

## 2024-01-01 NOTE — Progress Notes (Signed)
 LA TL PICC removed per protocol per MD order. Manual pressure applied for 5 mins. Vaseline gauze, gauze, and Tegaderm applied over insertion site. No bleeding or swelling noted. Instructed patient to remain in bed for thirty mins. Educated patient about S/S of infection and when to call MD; no heavy lifting or pressure on left side for 24 hours; keep dressing dry and intact for 24 hours. Pt verbalized comprehension.

## 2024-01-01 NOTE — Progress Notes (Addendum)
 DISCHARGE NOTE HOME Nicholas Burke to be discharged Home per MD order. Discussed prescriptions and follow up appointments with the patient. Prescriptions given to patient; medication list explained in detail. Patient verbalized understanding.  Skin clean, dry and intact without evidence of skin break down, no evidence of skin tears noted. IV catheter discontinued intact. Site without signs and symptoms of complications. Dressing and pressure applied. Pt denies pain at the site currently. No complaints noted.   Patient free of lines, drains, and wounds.   An After Visit Summary (AVS) was printed and given to the patient. Patient escorted via wheelchair, and discharged home via private auto.  Peyton SHAUNNA Pepper, RN

## 2024-01-10 ENCOUNTER — Other Ambulatory Visit: Payer: Self-pay

## 2024-01-10 DIAGNOSIS — I7100 Dissection of unspecified site of aorta: Secondary | ICD-10-CM

## 2024-01-14 DIAGNOSIS — N390 Urinary tract infection, site not specified: Secondary | ICD-10-CM | POA: Diagnosis not present

## 2024-01-14 DIAGNOSIS — M545 Low back pain, unspecified: Secondary | ICD-10-CM | POA: Diagnosis not present

## 2024-01-17 DIAGNOSIS — D500 Iron deficiency anemia secondary to blood loss (chronic): Secondary | ICD-10-CM | POA: Diagnosis not present

## 2024-01-17 DIAGNOSIS — I7100 Dissection of unspecified site of aorta: Secondary | ICD-10-CM | POA: Diagnosis not present

## 2024-01-17 DIAGNOSIS — N179 Acute kidney failure, unspecified: Secondary | ICD-10-CM | POA: Diagnosis not present

## 2024-01-17 DIAGNOSIS — Z09 Encounter for follow-up examination after completed treatment for conditions other than malignant neoplasm: Secondary | ICD-10-CM | POA: Diagnosis not present

## 2024-01-17 DIAGNOSIS — Z23 Encounter for immunization: Secondary | ICD-10-CM | POA: Diagnosis not present

## 2024-01-18 ENCOUNTER — Ambulatory Visit

## 2024-01-24 NOTE — Progress Notes (Unsigned)
 Office Note     CC: Descending thoracic intramural hematoma Requesting Provider:  No ref. provider found  HPI: Nicholas Burke is a 78 y.o. (09/30/1945) male presenting in follow-up with descending thoracic intramural hematoma from uncontrolled hypertension.  He was initially seen in the hospital with intramural hematoma.  This was medically managed, and the patient was asymptomatic, therefore we did not move forward with intervention.  On exam today, Nicholas Burke was doing well, accompanied by his wife.  He states he feels like he is back to his normal self outside of feeling a little more tired than usual.  He feels like his appetite is also improved significantly. Denies back pain, chest pain, abdominal pain Denies symptoms of claudication, ischemic rest pain, tissue loss  Past Medical History:  Diagnosis Date   Hx of radiation therapy 01/02/13- 02/24/13   prostate 7800 cGy/40 sessions, seminal vesicles 5600 cGy/40 sessions   Hypertension    Pre-diabetes    Prostate cancer (HCC) 08/30/2012   gleason 6, vol 65.7 cc    Past Surgical History:  Procedure Laterality Date   CATARACT EXTRACTION, BILATERAL Bilateral    COLONOSCOPY WITH PROPOFOL  N/A 10/18/2014   Procedure: COLONOSCOPY WITH PROPOFOL ;  Surgeon: [name], MD;  Location: [facility] ENDOSCOPY;  Service: Endoscopy;  Laterality: N/A;   CRYOABLATION N/A 12/24/2020   Procedure: CRYO ABLATION PROSTATE;  Surgeon: [name], MD;  Location: [facility] ORS;  Service: Urology;  Laterality: N/A;   ESOPHAGOGASTRODUODENOSCOPY N/A 12/20/2023   Procedure: EGD (ESOPHAGOGASTRODUODENOSCOPY);  Surgeon: [name], MD;  Location: [facility] ENDOSCOPY;  Service: Gastroenterology;  Laterality: N/A;   ESOPHAGOGASTRODUODENOSCOPY N/A 12/26/2023   Procedure: EGD (ESOPHAGOGASTRODUODENOSCOPY);  Surgeon: [name], MD;  Location: [facility] ENDOSCOPY;  Service: Gastroenterology;  Laterality: N/A;   EYE SURGERY Bilateral    cataract surgery   HOT HEMOSTASIS N/A 10/18/2014    Procedure: HOT HEMOSTASIS (ARGON PLASMA COAGULATION/BICAP);  Surgeon: [name], MD;  Location: [facility];  Service: Endoscopy;  Laterality: N/A;   PROSTATE BIOPSY  08/2012    Social History   Socioeconomic History   Marital status: Married    Spouse name: Not on file   Number of children: 4   Years of education: Not on file   Highest education level: Not on file  Occupational History    Employer: [employer]  Tobacco Use   Smoking status: Never   Smokeless tobacco: Never  Vaping Use   Vaping status: Never Used  Substance and Sexual Activity   Alcohol use: No   Drug use: No   Sexual activity: Not on file  Other Topics Concern   Not on file  Social History Narrative   Not on file   Social Drivers of Health   Financial Resource Strain: Not on file  Food Insecurity: No Food Insecurity (12/06/2023)   Hunger Vital Sign    Worried About Running Out of Food in the Last Year: Never true    Ran Out of Food in the Last Year: Never true  Transportation Needs: No Transportation Needs (12/06/2023)   PRAPARE - Administrator, Civil Service (Medical): No    Lack of Transportation (Non-Medical): No  Physical Activity: Not on file  Stress: Not on file  Social Connections: Socially Integrated (12/06/2023)   Social Connection and Isolation Panel    Frequency of Communication with Friends and Family: Three times a week    Frequency of Social Gatherings with Friends and Family: Three times a week    Attends Religious Services: More than  4 times per year    Active Member of Clubs or Organizations: Yes    Attends Banker Meetings: More than 4 times per year    Marital Status: Married  Catering manager Violence: Not At Risk (12/06/2023)   Humiliation, Afraid, Rape, and Kick questionnaire    Fear of Current or Ex-Partner: No    Emotionally Abused: No    Physically Abused: No    Sexually Abused: No   Family History  Problem Relation Age of Onset    Prostate cancer Father    Prostate cancer Brother     Current Outpatient Medications  Medication Sig Dispense Refill   albuterol  (VENTOLIN  HFA) 108 (90 Base) MCG/ACT inhaler Inhale 2 puffs into the lungs every 4 (four) hours as needed for wheezing or shortness of breath. 6.7 g 0   carvedilol  (COREG ) 25 MG tablet Take 1 tablet (25 mg total) by mouth 2 (two) times daily with a meal. 60 tablet 0   finasteride  (PROSCAR ) 5 MG tablet Take 1 tablet (5 mg total) by mouth daily. 30 tablet 0   hydrALAZINE  (APRESOLINE ) 50 MG tablet Take 1 tablet (50 mg total) by mouth every 6 (six) hours. 120 tablet 0   lisinopril  (ZESTRIL ) 20 MG tablet Take 1 tablet (20 mg total) by mouth daily. 30 tablet 0   pantoprazole  (PROTONIX ) 40 MG tablet Take 1 tablet (40 mg total) by mouth 2 (two) times daily before a meal. 60 tablet 1   polyethylene glycol powder (GLYCOLAX /MIRALAX ) 17 GM/SCOOP powder Dissolve 1 capful (17g) in 4-8 ounces of liquid and take by mouth daily. 238 g 0   rosuvastatin  (CRESTOR ) 5 MG tablet Take 5 mg by mouth 3 (three) times a week.     No current facility-administered medications for this visit.    No Known Allergies   REVIEW OF SYSTEMS:  [X]  denotes positive finding, [ ]  denotes negative finding Cardiac  Comments:  Chest pain or chest pressure:    Shortness of breath upon exertion:    Short of breath when lying flat:    Irregular heart rhythm:        Vascular    Pain in calf, thigh, or hip brought on by ambulation:    Pain in feet at night that wakes you up from your sleep:     Blood clot in your veins:    Leg swelling:         Pulmonary    Oxygen at home:    Productive cough:     Wheezing:         Neurologic    Sudden weakness in arms or legs:     Sudden numbness in arms or legs:     Sudden onset of difficulty speaking or slurred speech:    Temporary loss of vision in one eye:     Problems with dizziness:         Gastrointestinal    Blood in stool:     Vomited blood:          Genitourinary    Burning when urinating:     Blood in urine:        Psychiatric    Major depression:         Hematologic    Bleeding problems:    Problems with blood clotting too easily:        Skin    Rashes or ulcers:        Constitutional    Fever or chills:  PHYSICAL EXAMINATION:  There were no vitals filed for this visit.  General:  WDWN in NAD; vital signs documented above Gait: Not observed HENT: WNL, normocephalic Pulmonary: normal non-labored breathing , without wheezing Cardiac: regular HR Abdomen: soft, NT, no masses Skin: without rashes Vascular Exam/Pulses:  Right Left  Radial 2+ (normal) 2+ (normal)              DP 2+ (normal) 2+ (normal)       Extremities: without ischemic changes, without Gangrene , without cellulitis; without open wounds;  Musculoskeletal: no muscle wasting or atrophy  Neurologic: A&O X 3;  No focal weakness or paresthesias are detected Psychiatric:  The pt has Normal affect.   Non-Invasive Vascular Imaging:   New CTA demonstrated 5 cm descending thoracic aorta.  This has had significant aneurysmal degeneration over the last month.    ASSESSMENT/PLAN: Nicholas Burke is a 78 y.o. male presenting with descending aorta intramural hematoma.  He presents for 1 month follow-up to assess for aneurysmal degeneration versus aortic healing.  Imaging was reviewed.  He has had significant degeneration, now 5 cm in size from 4 cm.  1 cm growth over the last month necessitates repair.  I discussed the above with Nicholas Burke and his wife.  He would benefit from thoracic endovascular aortic repair.   After discussing the risks and benefits, Nicholas Burke elected to proceed.  I would appreciate cardiac clearance prior to moving forward with surgery.  Surgery will be scheduled following clearance.    Fonda FORBES Rim, MD [facility] [phone]

## 2024-01-25 ENCOUNTER — Ambulatory Visit (HOSPITAL_COMMUNITY)
Admission: RE | Admit: 2024-01-25 | Discharge: 2024-01-25 | Disposition: A | Discharge status: Home / Self Care | Source: Ambulatory Visit | Attending: Vascular Surgery | Admitting: Vascular Surgery

## 2024-01-25 DIAGNOSIS — I7100 Dissection of unspecified site of aorta: Secondary | ICD-10-CM | POA: Insufficient documentation

## 2024-01-25 DIAGNOSIS — I7123 Aneurysm of the descending thoracic aorta, without rupture: Secondary | ICD-10-CM | POA: Diagnosis not present

## 2024-01-25 DIAGNOSIS — K8020 Calculus of gallbladder without cholecystitis without obstruction: Secondary | ICD-10-CM | POA: Diagnosis not present

## 2024-01-25 DIAGNOSIS — I2510 Atherosclerotic heart disease of native coronary artery without angina pectoris: Secondary | ICD-10-CM | POA: Diagnosis not present

## 2024-01-25 DIAGNOSIS — J439 Emphysema, unspecified: Secondary | ICD-10-CM | POA: Diagnosis not present

## 2024-01-25 MED ORDER — IOHEXOL 350 MG/ML SOLN
100.0000 mL | Freq: Once | INTRAVENOUS | Status: AC | PRN
  Administered 2024-01-25: 80 mL via INTRAVENOUS

## 2024-01-27 ENCOUNTER — Other Ambulatory Visit (HOSPITAL_COMMUNITY): Payer: Self-pay

## 2024-01-27 ENCOUNTER — Encounter: Payer: Self-pay | Admitting: Vascular Surgery

## 2024-01-27 ENCOUNTER — Other Ambulatory Visit: Payer: Self-pay

## 2024-01-27 ENCOUNTER — Ambulatory Visit: Attending: Vascular Surgery | Admitting: Vascular Surgery

## 2024-01-27 VITALS — BP 152/92 | HR 86 | Temp 98.2°F | Resp 18 | Ht 70.0 in | Wt 188.6 lb

## 2024-01-27 DIAGNOSIS — I7123 Aneurysm of the descending thoracic aorta, without rupture: Secondary | ICD-10-CM

## 2024-01-28 ENCOUNTER — Telehealth (HOSPITAL_BASED_OUTPATIENT_CLINIC_OR_DEPARTMENT_OTHER): Payer: Self-pay | Admitting: *Deleted

## 2024-01-28 NOTE — Telephone Encounter (Signed)
   Pre-operative Risk Assessment    Patient Name: Nicholas Burke  DOB: 11/08/1945 MRN: [id number]   Date of last office visit: 2007 DR. KATZ Date of next office visit: NONE- PER DR. ROBINS HE WOULD LIKE THE PT TO SEE DR. TONLEU NEW PT APPT    Request for Surgical Clearance    Procedure:  TEVAR  Date of Surgery:  Clearance TBD                                Surgeon:  DR. LANIS Surgeon's Group or Practice Name:  [facility] Phone number:  [phone] Fax number:  [phone]   Type of Clearance Requested:   - Medical ; NONE INDICATED ON FORM TO BE HELD   Type of Anesthesia:  General    Additional requests/questions:    Bonney Niels Jest   01/28/2024, 10:34 AM

## 2024-01-28 NOTE — Telephone Encounter (Signed)
   Name: Nicholas Burke  DOB: [birth year]  MRN: [id number]  Primary Cardiologist: None  Chart reviewed as part of pre-operative protocol coverage. Because of Nicholas Burke past medical history and time since last visit, he will require a follow-up in-office visit in order to better assess preoperative cardiovascular risk.    Pre-Op request states: PER DR. ROBINS HE WOULD LIKE THE PT TO SEE DR. TONLEU NEW PT APPT.  He was formerly Dr. Micky' patient.   [name], NP  01/28/2024, 10:39 AM

## 2024-01-28 NOTE — Telephone Encounter (Signed)
 Pt has been scheduled NEW PT APPT FOR PREOP CLEARANCE; per Dr. Lanis with [facility] appt to be with Dr. Donley  New pt appt Dr. Donley 02/02/24 @ 9:40.

## 2024-01-31 ENCOUNTER — Telehealth: Payer: Self-pay

## 2024-01-31 ENCOUNTER — Other Ambulatory Visit (HOSPITAL_COMMUNITY): Payer: Self-pay

## 2024-01-31 MED ORDER — LISINOPRIL 20 MG PO TABS
20.0000 mg | ORAL_TABLET | Freq: Every day | ORAL | 0 refills | Status: DC
  Filled 2024-01-31: qty 30, 30d supply, fill #0

## 2024-01-31 MED ORDER — HYDRALAZINE HCL 50 MG PO TABS
50.0000 mg | ORAL_TABLET | Freq: Four times a day (QID) | ORAL | 0 refills | Status: DC
  Filled 2024-01-31: qty 120, 30d supply, fill #0

## 2024-01-31 MED ORDER — CARVEDILOL 25 MG PO TABS
25.0000 mg | ORAL_TABLET | Freq: Two times a day (BID) | ORAL | 0 refills | Status: DC
  Filled 2024-01-31: qty 60, 30d supply, fill #0

## 2024-01-31 NOTE — Telephone Encounter (Signed)
 Slater from [facility] called with call report from 01/25/24 CTA chest/aorta results. Pt had seen MD after this study, on 01/27/24.

## 2024-02-01 ENCOUNTER — Telehealth: Admitting: Physician Assistant

## 2024-02-01 DIAGNOSIS — R0602 Shortness of breath: Secondary | ICD-10-CM

## 2024-02-01 NOTE — Telephone Encounter (Signed)
 LMOVM at patient and wife's cell numbers requesting call back to direct number. Will offer appointment with Dr. Ren on 02/02/24.

## 2024-02-01 NOTE — Progress Notes (Signed)
 Virtual Visit Consent   Nicholas Burke, you are scheduled for a virtual visit with a [facility] provider today. Just as with appointments in the office, your consent must be obtained to participate. Your consent will be active for this visit and any virtual visit you may have with one of our providers in the next 365 days. If you have a MyChart account, a copy of this consent can be sent to you electronically.  As this is a virtual visit, video technology does not allow for your provider to perform a traditional examination. This may limit your provider's ability to fully assess your condition. If your provider identifies any concerns that need to be evaluated in person or the need to arrange testing (such as labs, EKG, etc.), we will make arrangements to do so. Although advances in technology are sophisticated, we cannot ensure that it will always work on either your end or our end. If the connection with a video visit is poor, the visit may have to be switched to a telephone visit. With either a video or telephone visit, we are not always able to ensure that we have a secure connection.  By engaging in this virtual visit, you consent to the provision of healthcare and authorize for your insurance to be billed (if applicable) for the services provided during this visit. Depending on your insurance coverage, you may receive a charge related to this service.  I need to obtain your verbal consent now. Are you willing to proceed with your visit today? Nicholas Burke has provided verbal consent on 02/01/2024 for a virtual visit (video or telephone). Nicholas Burke, [place]  Date: 02/01/2024 2:42 PM   Virtual Visit via Video Note   I, Nicholas Burke, connected with  Draedyn Weidinger  ([id number], 10-27-1945) on 02/01/24 at  2:30 PM EDT by a video-enabled telemedicine application and verified that I am speaking with the correct person using two identifiers.  Location: Patient: Virtual Visit Location Patient:  Home Provider: Virtual Visit Location Provider: Home Office   I discussed the limitations of evaluation and management by telemedicine and the availability of in person appointments. The patient expressed understanding and agreed to proceed.    History of Present Illness: Nicholas Burke is a 78 y.o. who identifies as a male who was assigned male at birth, and is being seen today for ongoing SOB since hospitalization on 12/06/2023 for intramural aortic hematoma. Notes SOB is at rest and with exertion. Some occasional AM chest congestion and cough. Denies fever, chills. Denies swelling of legs or feet. Had updated CTA on 9/30 with visit with Vascular Surgery on 10/2. Due to enlarging aneurysm, is slated for surgery pending cardiac clearance. Denies any chest pain or abdominal pain. Denies LH or dizziness. Taking prescribed medications as directed. Has upcoming appointment with Cardiology for follow-up and surgical clearance on 10/10 with Dr. Ren.    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) 12/06/2023   Glaucoma 12/06/2023   Type 2 diabetes with complication (HCC) 12/06/2023   Mixed hyperlipidemia 12/06/2023   History of malignant neoplasm of prostate 12/06/2023   Aortic dissection (HCC) 12/06/2023   Special screening for malignant neoplasms, colon 10/18/2014   Hx of radiation therapy    Prostate cancer (HCC) 11/03/2012    Allergies: No  Known Allergies Medications:  Current Outpatient Medications:    albuterol  (VENTOLIN  HFA) 108 (90 Base) MCG/ACT inhaler, Inhale 2 puffs into the lungs every 4 (four) hours as needed for wheezing or shortness of breath., Disp: 6.7 g, Rfl: 0   carvedilol  (COREG ) 25 MG tablet, Take 1 tablet (25 mg total) by mouth 2 (two) times daily with a meal.,  Disp: 60 tablet, Rfl: 0   carvedilol  (COREG ) 25 MG tablet, Take 1 tablet (25 mg total) by mouth 2 (two) times daily with food., Disp: 60 tablet, Rfl: 0   hydrALAZINE  (APRESOLINE ) 50 MG tablet, Take 1 tablet (50 mg total) by mouth every 6 (six) hours., Disp: 120 tablet, Rfl: 0   hydrALAZINE  (APRESOLINE ) 50 MG tablet, Take 1 tablet (50 mg total) by mouth every 6 (six) hours., Disp: 120 tablet, Rfl: 0   lisinopril  (ZESTRIL ) 20 MG tablet, Take 1 tablet (20 mg total) by mouth daily., Disp: 30 tablet, Rfl: 0   pantoprazole  (PROTONIX ) 40 MG tablet, Take 1 tablet (40 mg total) by mouth 2 (two) times daily before a meal., Disp: 60 tablet, Rfl: 1   polyethylene glycol powder (GLYCOLAX /MIRALAX ) 17 GM/SCOOP powder, Dissolve 1 capful (17g) in 4-8 ounces of liquid and take by mouth daily., Disp: 238 g, Rfl: 0   rosuvastatin  (CRESTOR ) 5 MG tablet, Take 5 mg by mouth 3 (three) times a week., Disp: , Rfl:   Observations/Objective: Patient is well-developed, well-nourished in no acute distress.  Resting comfortably at home.  Head is normocephalic, atraumatic.  No labored breathing. Speech is clear and coherent with logical content.  Patient is alert and oriented at baseline.  Assessment and Plan: 1. Shortness of breath (Primary)  In patient with likely deconditioning, active aortic aneurysm, chronic hypertension and noted emphysema on recent CTA. Needs in person examination and assessment to help determine true cause(s) to make sure proper treatment is given. Agrees to contact PCP directly. Follow-up with Cardiology as scheduled on 10/10. ER precautions reviewed.  Follow Up Instructions: I discussed the assessment and treatment plan with the patient. The patient was provided an opportunity to ask questions and all were answered. The patient agreed with the plan and demonstrated an understanding of the instructions.  A copy of instructions were sent to the patient via MyChart unless otherwise noted below.    The patient was advised to call back or seek an in-person evaluation if the symptoms worsen or if the condition fails to improve as anticipated.    Nicholas Nicholas Burke Lunger, PA-C

## 2024-02-01 NOTE — Telephone Encounter (Signed)
 Patient returned call and accepted appointment with Dr. Ren on 02/02/24 at 8:40am.

## 2024-02-01 NOTE — Patient Instructions (Signed)
  Lilian Myron, thank you for joining Elsie [name] Lunger, PA-C for today's virtual visit.  While this provider is not your primary care provider (PCP), if your PCP is located in our provider database this encounter information will be shared with them immediately following your visit.   A [facility] MyChart account gives you access to today's visit and all your visits, tests, and labs performed at [facility]  click here if you don't have a [facility] MyChart account or go to [email or web address]  Consent: (Patient) Nicholas Burke provided verbal consent for this virtual visit at the beginning of the encounter.  Current Medications:  Current Outpatient Medications:    albuterol  (VENTOLIN  HFA) 108 (90 Base) MCG/ACT inhaler, Inhale 2 puffs into the lungs every 4 (four) hours as needed for wheezing or shortness of breath., Disp: 6.7 g, Rfl: 0   carvedilol  (COREG ) 25 MG tablet, Take 1 tablet (25 mg total) by mouth 2 (two) times daily with a meal., Disp: 60 tablet, Rfl: 0   carvedilol  (COREG ) 25 MG tablet, Take 1 tablet (25 mg total) by mouth 2 (two) times daily with food., Disp: 60 tablet, Rfl: 0   hydrALAZINE  (APRESOLINE ) 50 MG tablet, Take 1 tablet (50 mg total) by mouth every 6 (six) hours., Disp: 120 tablet, Rfl: 0   hydrALAZINE  (APRESOLINE ) 50 MG tablet, Take 1 tablet (50 mg total) by mouth every 6 (six) hours., Disp: 120 tablet, Rfl: 0   lisinopril  (ZESTRIL ) 20 MG tablet, Take 1 tablet (20 mg total) by mouth daily., Disp: 30 tablet, Rfl: 0   pantoprazole  (PROTONIX ) 40 MG tablet, Take 1 tablet (40 mg total) by mouth 2 (two) times daily before a meal., Disp: 60 tablet, Rfl: 1   polyethylene glycol powder (GLYCOLAX /MIRALAX ) 17 GM/SCOOP powder, Dissolve 1 capful (17g) in 4-8 ounces of liquid and take by mouth daily., Disp: 238 g, Rfl: 0   rosuvastatin  (CRESTOR ) 5 MG tablet, Take 5 mg by mouth 3 (three) times a week., Disp: , Rfl:    Medications ordered in this encounter:  No orders  of the defined types were placed in this encounter.    *If you need refills on other medications prior to your next appointment, please contact your pharmacy*  Follow-Up: Call back or seek an in-person evaluation if the symptoms worsen or if the condition fails to improve as anticipated.  [facility] [phone]  Other Instructions Please call your PCP for follow-up appt today or tomorrow. If unavailable, please use link below to be seen at one of our in-person locations. Follow-up with Cardiology as scheduled. Continue to hydrate and rest. If you note any non-resolving, new, or worsening symptoms before PCP follow-up please seek an in-person evaluation ASAP at nearest ER.    If you have been instructed to have an in-person evaluation today at a local Urgent Care facility, please use the link below. It will take you to a list of all of our available [facility], including address, phone number and hours of operation. Please do not delay care.  [facility]  If you or a family member do not have a primary care provider, use the link below to schedule a visit and establish care. When you choose a [facility] primary care physician or advanced practice provider, you gain a long-term partner in health. Find a Primary Care Provider  Learn more about [facility] in-office and virtual care options: [facility] - Get Care Now

## 2024-02-01 NOTE — Telephone Encounter (Signed)
-----   Message from Joelle VEAR [place] Tonleu sent at 01/27/2024  5:09 PM EDT ----- Regarding: Add Patient to 8 AM next wednesday Nicholas Burke,    My name is Joelle VEAR. Azobou Tonleu and I am one of the new cardiologists at [facility]. This patient has been referred to cardiology for pre-op clearance and the vascular surgeon reached out to me. Would it be possible to get this patient on my schedule next Wednesday at 8 AM. I see I have an opening then.  Thanks [employer]

## 2024-02-02 ENCOUNTER — Ambulatory Visit

## 2024-02-02 ENCOUNTER — Other Ambulatory Visit: Payer: Self-pay | Admitting: *Deleted

## 2024-02-02 ENCOUNTER — Ambulatory Visit (HOSPITAL_COMMUNITY)
Admission: RE | Admit: 2024-02-02 | Discharge: 2024-02-02 | Disposition: A | Discharge status: Home / Self Care | Source: Ambulatory Visit | Attending: Cardiology | Admitting: Cardiology

## 2024-02-02 ENCOUNTER — Other Ambulatory Visit (HOSPITAL_COMMUNITY): Payer: Self-pay

## 2024-02-02 VITALS — BP 152/88 | HR 90 | Ht 70.0 in | Wt 187.0 lb

## 2024-02-02 DIAGNOSIS — E118 Type 2 diabetes mellitus with unspecified complications: Secondary | ICD-10-CM

## 2024-02-02 DIAGNOSIS — Z01818 Encounter for other preprocedural examination: Secondary | ICD-10-CM

## 2024-02-02 DIAGNOSIS — R0602 Shortness of breath: Secondary | ICD-10-CM | POA: Diagnosis not present

## 2024-02-02 DIAGNOSIS — Z79899 Other long term (current) drug therapy: Secondary | ICD-10-CM | POA: Diagnosis not present

## 2024-02-02 DIAGNOSIS — I10 Essential (primary) hypertension: Secondary | ICD-10-CM

## 2024-02-02 DIAGNOSIS — I7100 Dissection of unspecified site of aorta: Secondary | ICD-10-CM

## 2024-02-02 MED ORDER — AMLODIPINE BESYLATE 10 MG PO TABS
10.0000 mg | ORAL_TABLET | Freq: Every day | ORAL | 3 refills | Status: DC
  Filled 2024-02-02: qty 90, 90d supply, fill #0

## 2024-02-02 MED ORDER — HYDRALAZINE HCL 50 MG PO TABS
50.0000 mg | ORAL_TABLET | Freq: Three times a day (TID) | ORAL | 3 refills | Status: DC
  Filled 2024-02-02 – 2024-02-27 (×2): qty 270, 90d supply, fill #0

## 2024-02-02 NOTE — Patient Instructions (Signed)
 Medication Instructions:  Please start Amlodipine  10 mg once a day. Change Hydralazine  to 50 mg three times a day. Continue all other medications as listed.  *If you need a refill on your cardiac medications before your next appointment, please call your pharmacy*  Lab Work: Please have blood work today.  If you have labs (blood work) drawn today and your tests are completely normal, you will receive your results only by: MyChart Message (if you have MyChart) OR A paper copy in the mail If you have any lab test that is abnormal or we need to change your treatment, we will call you to review the results.  Testing/Procedures: A chest x-ray takes a picture of the organs and structures inside the chest, including the heart, lungs, and blood vessels. This test can show several things, including, whether the heart is enlarges; whether fluid is building up in the lungs; and whether pacemaker / defibrillator leads are still in place.  Follow-Up: At [facility], you and your health needs are our priority.  As part of our continuing mission to provide you with exceptional heart care, our providers are all part of one team.  This team includes your primary Cardiologist (physician) and Advanced Practice Providers or APPs (Physician Assistants and Nurse Practitioners) who all work together to provide you with the care you need, when you need it.  Your next appointment:   1 month(s)  Provider:   Joelle VEAR Ren Donley, MD    We recommend signing up for the patient portal called MyChart.  Sign up information is provided on this After Visit Summary.  MyChart is used to connect with patients for Virtual Visits (Telemedicine).  Patients are able to view lab/test results, encounter notes, upcoming appointments, etc.  Non-urgent messages can be sent to your provider as well.   To learn more about what you can do with MyChart, go to [email or web address].

## 2024-02-02 NOTE — Progress Notes (Signed)
 Cardiology Office Note Date:  02/02/2024  ID:  Nicholas Burke, DOB February 20, 1946, MRN [id number] PCP:  Auston Opal, DO  Cardiologist:  Nicholas VEAR Ren Donley, MD  No chief complaint on file.     Problems Pre-op for type B aortic dissection w/ descending thoracic intramural hematoma needing TEVAR TTE 8/25 55-60% CTA w/ mod LAD calcification Uncontrolled HTN HLD on RN5 3x/week DM: HA1C 7.1 8/25  Visits  10/25: Hydral to TID, started amlo 10, LP, BMP, Mag, BNP, chest x-ray, renin-aldo    History of Present Illness: Nicholas Burke is a 78 y.o. male who presents for pre-op.  He presented to the hospital back in September with some abdominal pain and was diagnosed with type B aortic dissection with large intramural hematoma.  During that hospitalization, he was given too much fluid and subsequently required IV Lasix  x 3.  Since then he reports that he has continued to have shortness of breath.  He denies any chest pain or lower extremity edema, but has had orthopnea and dry cough.  He reported being able to walk up a flight of stairs.  He denies any tobacco use or family history of heart disease.  ROS: Please see the history of present illness. All other systems are reviewed and negative.   Past Medical History:  Diagnosis Date   AAA (abdominal aortic aneurysm)    Hx of radiation therapy 01/02/13- 02/24/13   prostate 7800 cGy/40 sessions, seminal vesicles 5600 cGy/40 sessions   Hypertension    Pre-diabetes    Prostate cancer (HCC) 08/30/2012   gleason 6, vol 65.7 cc    Past Surgical History:  Procedure Laterality Date   CATARACT EXTRACTION, BILATERAL Bilateral    COLONOSCOPY WITH PROPOFOL  N/A 10/18/2014   Procedure: COLONOSCOPY WITH PROPOFOL ;  Surgeon: [name], MD;  Location: [facility] ENDOSCOPY;  Service: Endoscopy;  Laterality: N/A;   CRYOABLATION N/A 12/24/2020   Procedure: CRYO ABLATION PROSTATE;  Surgeon: [name], MD;  Location: [facility] ORS;  Service: Urology;  Laterality:  N/A;   ESOPHAGOGASTRODUODENOSCOPY N/A 12/20/2023   Procedure: EGD (ESOPHAGOGASTRODUODENOSCOPY);  Surgeon: [name], MD;  Location: [facility] ENDOSCOPY;  Service: Gastroenterology;  Laterality: N/A;   ESOPHAGOGASTRODUODENOSCOPY N/A 12/26/2023   Procedure: EGD (ESOPHAGOGASTRODUODENOSCOPY);  Surgeon: [name], MD;  Location: [facility] ENDOSCOPY;  Service: Gastroenterology;  Laterality: N/A;   EYE SURGERY Bilateral    cataract surgery   HOT HEMOSTASIS N/A 10/18/2014   Procedure: HOT HEMOSTASIS (ARGON PLASMA COAGULATION/BICAP);  Surgeon: [name], MD;  Location: [facility];  Service: Endoscopy;  Laterality: N/A;   PROSTATE BIOPSY  08/2012    Current Outpatient Medications  Medication Sig Dispense Refill   albuterol  (VENTOLIN  HFA) 108 (90 Base) MCG/ACT inhaler Inhale 2 puffs into the lungs every 4 (four) hours as needed for wheezing or shortness of breath. 6.7 g 0   carvedilol  (COREG ) 25 MG tablet Take 1 tablet (25 mg total) by mouth 2 (two) times daily with a meal. 60 tablet 0   hydrALAZINE  (APRESOLINE ) 50 MG tablet Take 1 tablet (50 mg total) by mouth every 6 (six) hours. 120 tablet 0   lisinopril  (ZESTRIL ) 20 MG tablet Take 1 tablet (20 mg total) by mouth daily. 30 tablet 0   pantoprazole  (PROTONIX ) 40 MG tablet Take 1 tablet (40 mg total) by mouth 2 (two) times daily before a meal. 60 tablet 1   polyethylene glycol powder (GLYCOLAX /MIRALAX ) 17 GM/SCOOP powder Dissolve 1 capful (17g) in 4-8 ounces of liquid and take by mouth daily. 238 g  0   rosuvastatin  (CRESTOR ) 5 MG tablet Take 5 mg by mouth 3 (three) times a week.     carvedilol  (COREG ) 25 MG tablet Take 1 tablet (25 mg total) by mouth 2 (two) times daily with food. (Patient not taking: Reported on 02/02/2024) 60 tablet 0   hydrALAZINE  (APRESOLINE ) 50 MG tablet Take 1 tablet (50 mg total) by mouth every 6 (six) hours. (Patient not taking: Reported on 02/02/2024) 120 tablet 0   No current facility-administered medications for this visit.     Allergies:   Patient has no known allergies.   Social History:  See above  Family History:  See above  PHYSICAL EXAM: VS:  BP (!) 152/88 (BP Location: Left Arm, Patient Position: Sitting)   Pulse 90   Ht 5' 10 (1.778 m)   Wt 187 lb (84.8 kg)   SpO2 98%   BMI 26.83 kg/m  , BMI Body mass index is 26.83 kg/m. GEN: Well nourished, well developed, in no acute distress HEENT: normal Neck: no JVD, carotid bruits, or masses Cardiac: RRR; no murmurs, rubs, or gallops,no edema  Respiratory:  CTAB bilaterally, normal work of breathing GI: soft, nontender, nondistended, + BS Extremities: No LE edema Skin: warm and dry, no rash Neuro:  Strength and sensation are intact  EKG: NSR  Recent Labs: 12/30/2023: ALT 42 01/01/2024: BUN 37; Creatinine, Ser 1.40; Hemoglobin 8.6; Magnesium  1.9; Platelets 253; Potassium 3.8; Sodium 142      Component Value Date/Time   TRIG 53 12/27/2023 0433     Wt Readings from Last 5 Encounters:  02/02/24 187 lb (84.8 kg)  01/27/24 188 lb 9.6 oz (85.5 kg)  01/01/24 213 lb 13.5 oz (97 kg)  12/30/20 200 lb (90.7 kg)  12/24/20 198 lb 3.1 oz (89.9 kg)     BP Readings from Last 5 Encounters:  02/02/24 (!) 152/88  01/27/24 (!) 152/92  01/01/24 138/85  09/25/23 (!) 148/87  12/30/20 (!) 179/107    Studies: Reviewed  ASSESSMENT AND PLAN: Nicholas Burke is a 78 y.o. male who presents for pre-op.  #Pre-op for TEVAR #Uncontrolled HTN #DM #HLD w/ statin intolerance #Mod CAC in LAD - He is presenting for preop evaluation for TEVAR.  He is able to walk up a flight of stairs and had recent TTE with normal EF. He is intermediate risk for surgery but no additional evaluation needed at this time. - He is report some dyspnea and orthopnea since leaving the hospital. He had received some IV lasix  during hospital in setting of receiving too much fluid. No evidence of volume overload on exam but I suspect he may still be overloaded. Will obtain chest x-ray and BNP to  assess for volume and/or infection. - Will reduce hydral to 50 mg TID and start amlo 10 daily --> encouraged to check BP daily - Send for LP, renin-aldo - Follow up in 1 month to add Jardiance, optimize BP meds and consider zetia for LDL lowering   Signed, Nicholas VEAR Ren Donley, MD  02/02/2024 8:49 AM    [facility]

## 2024-02-03 DIAGNOSIS — R300 Dysuria: Secondary | ICD-10-CM | POA: Diagnosis not present

## 2024-02-03 DIAGNOSIS — R61 Generalized hyperhidrosis: Secondary | ICD-10-CM | POA: Diagnosis not present

## 2024-02-03 LAB — BRAIN NATRIURETIC PEPTIDE: BNP: 192.7 pg/mL — ABNORMAL HIGH (ref 0.0–100.0)

## 2024-02-04 ENCOUNTER — Other Ambulatory Visit (HOSPITAL_COMMUNITY): Payer: Self-pay

## 2024-02-04 ENCOUNTER — Ambulatory Visit

## 2024-02-04 ENCOUNTER — Ambulatory Visit: Payer: Self-pay

## 2024-02-04 DIAGNOSIS — E8770 Fluid overload, unspecified: Secondary | ICD-10-CM

## 2024-02-04 MED ORDER — FUROSEMIDE 20 MG PO TABS
40.0000 mg | ORAL_TABLET | Freq: Every day | ORAL | 3 refills | Status: DC
  Filled 2024-02-04: qty 90, 45d supply, fill #0

## 2024-02-07 ENCOUNTER — Other Ambulatory Visit: Payer: Self-pay

## 2024-02-07 DIAGNOSIS — I7123 Aneurysm of the descending thoracic aorta, without rupture: Secondary | ICD-10-CM

## 2024-02-07 LAB — LIPID PANEL
Chol/HDL Ratio: 3.5 ratio (ref 0.0–5.0)
Cholesterol, Total: 109 mg/dL (ref 100–199)
HDL: 31 mg/dL — ABNORMAL LOW (ref >39)
LDL Chol Calc (NIH): 63 mg/dL (ref 0–99)
Triglycerides: 73 mg/dL (ref 0–149)
VLDL Cholesterol Cal: 15 mg/dL (ref 5–40)

## 2024-02-07 LAB — ALDOSTERONE + RENIN ACTIVITY W/ RATIO
Aldos/Renin Ratio: 64 — AB (ref 0.0–30.0)
Aldosterone: 18.3 ng/dL (ref 0.0–30.0)
Renin Activity, Plasma: 0.286 ng/mL/h (ref 0.167–5.380)

## 2024-02-07 NOTE — Telephone Encounter (Signed)
 Called patient to make sure he was aware of Dr. Ren Peng advisement and when and where to get lab work done. Patient verbalized understanding and will get lab work done this Friday.

## 2024-02-11 ENCOUNTER — Encounter (HOSPITAL_COMMUNITY): Payer: Self-pay | Admitting: Vascular Surgery

## 2024-02-11 ENCOUNTER — Other Ambulatory Visit: Payer: Self-pay

## 2024-02-11 DIAGNOSIS — E8770 Fluid overload, unspecified: Secondary | ICD-10-CM | POA: Diagnosis not present

## 2024-02-11 LAB — BASIC METABOLIC PANEL WITH GFR
BUN/Creatinine Ratio: 14 (ref 10–24)
BUN: 16 mg/dL (ref 8–27)
CO2: 24 mmol/L (ref 20–29)
Calcium: 9.2 mg/dL (ref 8.6–10.2)
Chloride: 101 mmol/L (ref 96–106)
Creatinine, Ser: 1.13 mg/dL (ref 0.76–1.27)
Glucose: 119 mg/dL — ABNORMAL HIGH (ref 70–99)
Potassium: 4.1 mmol/L (ref 3.5–5.2)
Sodium: 140 mmol/L (ref 134–144)
eGFR: 67 mL/min/1.73 (ref >59)

## 2024-02-11 LAB — MAGNESIUM: Magnesium: 1.8 mg/dL (ref 1.6–2.3)

## 2024-02-11 NOTE — Progress Notes (Signed)
 Anesthesia Chart Review: Nicholas Burke  Case: [id number] Date/Time: 02/14/24 0715   Procedure: INSERTION, ENDOVASCULAR STENT GRAFT, AORTA, THORACIC   Anesthesia type: General   Diagnosis: Aneurysm of the descending thoracic aorta, without rupture [I71.23]   Pre-op diagnosis: AAA W/O RUPTURE   [facility] ROOM [facility] / [facility]   Surgeons: Lanis Fonda BRAVO, MD       DISCUSSION: Patient is a 78 year old male scheduled for the above procedure.  History includes never smoker, HTN, prostate cancer (s/p radiation 2014; s/p right hemi-cryoablation 12/24/2020), prediabetes, type B aortic dissection (with intramural aortic hematoma 12/06/2023, managed conservatively by vascular surgery), AAA, GI bleed (EGD + gastric ulcers, s/p 5 units PRBC, PPI 11/2023).  He has vascular follow-up with Dr. Lanis on 01/27/2024 to review repeat imaging and assess for aneurysmal degeneration versus aortic healing. CT showed significant degeneration, now 5 cm in size from 4 cm.  1 cm growth over the last month necessitates repair. TEVAR recommended. He was referred for preoperative cardiology evaluation.   He had cardiology visit with preoperative evaluation by Dr. Joelle Cedars Tonleu on 02/02/2024. As above, admitted in August with type B aortic dissection with large amount of intramural hematoma. He was also evaluated for anemia. He required PRBCs and IVF, but later required IV Lasix  for volume excess. He reported on-going SOB, no chest pain at his cardiology visit. Reported ability to walk up a flight of stairs. 12/10/2023 TTE showed LVEF 55-60%, no RWMA, normal RV systolic function, trivial MR, mild AR, mild TR. Clinically did not appear volume overloaded but suspected he may be. BNP 192.7, so Lasix  prescribed for 5 days. CXR without acute findings. Hydralazine  reduced to 50 mg TID and started on amlodipine  10 mg daily. Follow-up in one month planned. In regards to surgery he wrote, He is presenting for preop evaluation for  TEVAR.  He is able to walk up a flight of stairs and had recent TTE with normal EF. He is intermediate risk for surgery but no additional evaluation needed at this time.  He was treated for complicated UTI with suspected prostatitis on 02/03/2024. He had already completed a course of cephalexin  (prescribed 01/14/2024) for dysuria, but with incomplete resolution of symptoms and with intermittent night sweats. He was started on a 10 days course of ciprofloxacin while awaiting cultures. Cultures showed E. Coli resistant to several antibiotics, so he was changed to Nitrofurantoin  100 mg BID x 14 days based on sensitivities. I sent a staff message to Dr. Lanis and PAT RN also contacted his RN scheduler to report ongoing treatment for UTI. I will also attempt to reach out to Infection Prevention for any recommendations or have staff follow-up on the day of surgery.    Anesthesia team to evaluate on the day of surgery.   VS:  Wt Readings from Last 3 Encounters:  02/02/24 84.8 kg  01/27/24 85.5 kg  01/01/24 97 kg   BP Readings from Last 3 Encounters:  02/02/24 (!) 152/88  01/27/24 (!) 152/92  01/01/24 138/85   Pulse Readings from Last 3 Encounters:  02/02/24 90  01/27/24 86  01/01/24 82     PROVIDERS: Auston Opal, DO is PCP Donley Joelle, MD is cardiologist Lanis Fonda, MD is vascular surgeon   LABS: For updated labs on arrival as indicated. Lab results noted in [facility] Everywhere include: -02/08/2024 WBC 7.4, hemoglobin 10.0, hematocrit 31.6, platelet count 388, glucose 98, creatinine 1.04, sodium 140, potassium 4.6 corrected calcium  9.72, albumin  3.3, total bili  0.5, alkaline phosphatase 58, AST 19, ALT 11 - 01/19/2024 WBC 5.3, hemoglobin 10.4, hematocrit 31.7, platelet count 384 glucose 169, BUN 10, creatinine 1.07, sodium 139, potassium 4.2, calcium  8.3 (corrected 9.04), albumin  3.1  Lab Results  Component Value Date   INR 1.2 12/22/2023   HGBA1C 7.1 (H) 12/06/2023     Upper Endoscopy Reviewed 01/05/2024 (as outlined in [facility] Everywhere): Interpretation:no H. pylori, no intestinal metaplasia, he had gastric ulcers but they were related to NG tube trauma, no repeat EGD needed, reassurance. Performing Lab: Notes/Report: no H. pylori, no intestinal metaplasia, he had gastric ulcers but they were related to NG tube trauma, no repeat EGD needed, reassurance.    IMAGES: CXR 02/02/2024: IMPRESSION: 1. No acute abnormality of the lungs. 2. Unchanged aneurysmal contour of the descending thoracic aorta, better assessed by recent CT angiogram.   CTA chest/aorta 01/25/2024: IMPRESSION: 1. Progressive aneurysm of the proximal descending thoracic aorta measuring 4.3 x 4.5 cm, with interval development of multiple areas of penetrating ulceration and early dissection, associated with the known intramural hematoma. Vascular consultation is advised. 2. These results will be called to the ordering clinician or representative by the radiologist assistant, and communication documented in the PACS or [facility] dashboard. 3. Mild emphysema 4. Moderate coronary artery calcification   CTA chest/abdomen/pelvis 12/20/2023: IMPRESSION: 1. No significant change in the previously demonstrated intramural hematoma extending from the origin of the left subclavian artery into the abdomen and right common and external iliac arteries. 2. Interval increase in aneurysmal dilatation of the proximal descending thoracic aorta, measuring 4.9 cm in diameter. 3. Interval small amount of patchy density in the posterior right upper lobe, compatible with pneumonia. 4. Interval small right pleural effusion and increased size of the previously demonstrated small left pleural effusion, currently small to moderate in size. 5. Interval dilatation of the stomach and mild dilatation of the proximal and mid small bowel loops with some low-density wall thickening in the pelvis. This is  compatible with enteritis with partial small bowel obstruction or gastric and small-bowel ileus. 6. No evidence of active gastrointestinal bleeding. 7. Large right inguinal hernia containing fat and fluid, moderate-sized left inguinal hernia containing fat and small umbilical hernia containing fat. 8.  Calcific coronary artery and aortic atherosclerosis. - Aortic Atherosclerosis (ICD10-I70.0).  US  Renal Artery 12/14/2023: Summary:  Renal:  Right: No evidence of right renal artery stenosis. Normal right         Resisitive Index. Normal size right kidney. RRV flow present.  Left:  No evidence of left renal artery stenosis. Normal left         Resistive Index. Normal size of left kidney. LRV flow         present. Left renal artery appears tortuous.  Mesenteric:  Normal Celiac artery and Superior Mesenteric artery findings.    EKG: 02/02/2024: Normal sinus rhythm When compared with ECG of 06-Dec-2023 10:01, No significant change was found Confirmed by Ren Donley Prader ([id number]) on 02/02/2024 8:49:00 AM   CV: Echo 12/10/2023: IMPRESSIONS   1. Left ventricular ejection fraction, by estimation, is 55 to 60%. The  left ventricle has normal function. The left ventricle has no regional  wall motion abnormalities. Left ventricular diastolic parameters are  indeterminate.   2. Right ventricular systolic function is normal. The right ventricular  size is normal.   3. The mitral valve is normal in structure. Trivial mitral valve  regurgitation.   4. The aortic valve is tricuspid. Aortic valve regurgitation is mild.  Past Medical History:  Diagnosis Date   AAA (abdominal aortic aneurysm)    Hx of radiation therapy 01/02/13- 02/24/13   prostate 7800 cGy/40 sessions, seminal vesicles 5600 cGy/40 sessions   Hypertension    Pre-diabetes    Prostate cancer (HCC) 08/30/2012   gleason 6, vol 65.7 cc    Past Surgical History:  Procedure Laterality Date   CATARACT EXTRACTION, BILATERAL  Bilateral    COLONOSCOPY WITH PROPOFOL  N/A 10/18/2014   Procedure: COLONOSCOPY WITH PROPOFOL ;  Surgeon: [name], MD;  Location: [facility] ENDOS[facility];  Service: Endoscopy;  Laterality: N/A;   CRYOABLATION N/A 12/24/2020   Procedure: CRYO ABLATION PROSTATE;  Surgeon: [name], MD;  Location: [facility] ORS;  Service: Urology;  Laterality: N/A;   ESOPHAGOGASTRODUODENOSCOPY N/A 12/20/2023   Procedure: EGD (ESOPHAGOGASTRODUODENOSCOPY);  Surgeon: [name], MD;  Location: [facility] ENDOSCOPY;  Service: Gastroenterology;  Laterality: N/A;   ESOPHAGOGASTRODUODENOSCOPY N/A 12/26/2023   Procedure: EGD (ESOPHAGOGASTRODUODENOSCOPY);  Surgeon: [name], MD;  Location: [facility] ENDOSCOPY;  Service: Gastroenterology;  Laterality: N/A;   EYE SURGERY Bilateral    cataract surgery   HOT HEMOSTASIS N/A 10/18/2014   Procedure: HOT HEMOSTASIS (ARGON PLASMA COAGULATION/BICAP);  Surgeon: [name], MD;  Location: [facility];  Service: Endoscopy;  Laterality: N/A;   PROSTATE BIOPSY  08/2012    MEDICATIONS: No current facility-administered medications for this encounter.    albuterol  (VENTOLIN  HFA) 108 (90 Base) MCG/ACT inhaler   amLODipine  (NORVASC ) 10 MG tablet   carvedilol  (COREG ) 25 MG tablet   hydrALAZINE  (APRESOLINE ) 50 MG tablet   lisinopril  (ZESTRIL ) 20 MG tablet   Multiple Vitamins-Minerals (MULTIVITAMIN WITH MINERALS) tablet   nitrofurantoin , macrocrystal-monohydrate, (MACROBID ) 100 MG capsule   Nystatin (GERHARDT'S BUTT CREAM) CREA   pantoprazole  (PROTONIX ) 40 MG tablet   polyethylene glycol powder (GLYCOLAX /MIRALAX ) 17 GM/SCOOP powder   rosuvastatin  (CRESTOR ) 5 MG tablet   furosemide  (LASIX ) 20 MG tablet    Isaiah Ruder, PA-C Surgical Short Stay/Anesthesiology [facility] Phone [phone] [facility] Phone [phone] 02/11/2024 1:18 PM

## 2024-02-11 NOTE — Anesthesia Preprocedure Evaluation (Signed)
 Anesthesia Evaluation  Patient identified by MRN, date of birth, ID band Patient awake    Reviewed: Allergy & Precautions, NPO status , Patient's Chart, lab work & pertinent test results  Airway Mallampati: II  TM Distance: >3 FB Neck ROM: Full    Dental   Pulmonary neg pulmonary ROS   breath sounds clear to auscultation       Cardiovascular hypertension, Pt. on medications + Peripheral Vascular Disease   Rhythm:Regular Rate:Normal     Neuro/Psych negative neurological ROS     GI/Hepatic negative GI ROS, Neg liver ROS,,,  Endo/Other  diabetes    Renal/GU negative Renal ROS     Musculoskeletal   Abdominal   Peds  Hematology  (+) Blood dyscrasia, anemia   Anesthesia Other Findings   Reproductive/Obstetrics                              Anesthesia Physical Anesthesia Plan  ASA: 3  Anesthesia Plan: General   Post-op Pain Management: Tylenol  PO (pre-op)*   Induction: Intravenous  PONV Risk Score and Plan: 2 and Dexamethasone  and Treatment may vary due to age or medical condition  Airway Management Planned: Oral ETT  Additional Equipment: Arterial line  Intra-op Plan:   Post-operative Plan: Extubation in OR  Informed Consent: I have reviewed the patients History and Physical, chart, labs and discussed the procedure including the risks, benefits and alternatives for the proposed anesthesia with the patient or authorized representative who has indicated his/her understanding and acceptance.     Dental advisory given  Plan Discussed with: CRNA  Anesthesia Plan Comments: (PAT note written 02/11/2024 by Jaclynne Baldo, PA-C.  )         Anesthesia Quick Evaluation

## 2024-02-11 NOTE — Progress Notes (Signed)
 Infection prevention contacted about pt's MDRO report showing E. Coli. IP nurse stated we should put in a per protocol order for any patient with MDRO. This order has been added for pt's surgery on 02/14/24.

## 2024-02-11 NOTE — Progress Notes (Signed)
 PCP - Milo Costa, DO Cardiologist - Joelle VEAR Ren Donley, MD  Chest x-ray - 02/02/24 EKG - 02/02/24 Stress Test - 05/05/19 ECHO - 12/09/23  Anesthesia review: Y  Patient verbally denies any shortness of breath, fever, cough and chest pain during phone call   -------------  SDW INSTRUCTIONS given:  Your procedure is scheduled on Monday, Oct 20th.  Report to [facility] Main Entrance A at 0530 A.M., and check in at the Admitting office.  Call this number if you have problems the morning of surgery:  [phone]   Remember:  Do not eat or drink after midnight the night before your surgery    Take these medicines the morning of surgery with A SIP OF WATER   amLODipine  (NORVASC )  carvedilol  (COREG )  hydrALAZINE  (APRESOLINE )  MACROBID   pantoprazole  (PROTONIX )  albuterol  (VENTOLIN  HFA)-if needed (Please bring with you on day of surgery)  As of today, STOP taking any Aspirin (unless otherwise instructed by your surgeon) Aleve, Naproxen, Ibuprofen , Motrin , Advil , Goody's, BC's, all herbal medications, fish oil, and all vitamins.                      Do not wear jewelry, make up, or nail polish            Do not wear lotions, powders, perfumes/colognes, or deodorant.            Do not shave 48 hours prior to surgery.  Men may shave face and neck.            Do not bring valuables to the hospital.            [facility] is not responsible for any belongings or valuables.  Do NOT Smoke (Tobacco/Vaping) 24 hours prior to your procedure If you use a CPAP at night, you may bring all equipment for your overnight stay.   Contacts, glasses, dentures or bridgework may not be worn into surgery.      For patients admitted to the hospital, discharge time will be determined by your treatment team.   Patients discharged the day of surgery will not be allowed to drive home, and someone needs to stay with them for 24 hours.    Special instructions:   [place]- Preparing For  Surgery  Before surgery, you can play an important role. Because skin is not sterile, your skin needs to be as free of germs as possible. You can reduce the number of germs on your skin by washing with CHG (chlorahexidine gluconate) Soap before surgery.  CHG is an antiseptic cleaner which kills germs and bonds with the skin to continue killing germs even after washing.    Oral Hygiene is also important to reduce your risk of infection.  Remember - BRUSH YOUR TEETH THE MORNING OF SURGERY WITH YOUR REGULAR TOOTHPASTE  Please do not use if you have an allergy to CHG or antibacterial soaps. If your skin becomes reddened/irritated stop using the CHG.  Do not shave (including legs and underarms) for at least 48 hours prior to first CHG shower. It is OK to shave your face.  Please follow these instructions carefully.   Shower the NIGHT BEFORE SURGERY and the MORNING OF SURGERY with DIAL Soap.   Pat yourself dry with a CLEAN TOWEL.  Wear CLEAN PAJAMAS to bed the night before surgery  Place CLEAN SHEETS on your bed the night of your first shower and DO NOT SLEEP WITH PETS.   Day of Surgery:  Please shower morning of surgery  Wear Clean/Comfortable clothing the morning of surgery Do not apply any deodorants/lotions.   Remember to brush your teeth WITH YOUR REGULAR TOOTHPASTE.   Questions were answered. Patient verbalized understanding of instructions.

## 2024-02-14 ENCOUNTER — Inpatient Hospital Stay (HOSPITAL_COMMUNITY)

## 2024-02-14 ENCOUNTER — Encounter (HOSPITAL_COMMUNITY): Payer: Self-pay | Admitting: Vascular Surgery

## 2024-02-14 ENCOUNTER — Encounter (HOSPITAL_COMMUNITY)
Admission: RE | Disposition: A | Discharge status: Home Health | Payer: Self-pay | Source: Home / Self Care | Attending: Vascular Surgery

## 2024-02-14 ENCOUNTER — Inpatient Hospital Stay (HOSPITAL_COMMUNITY): Admitting: Vascular Surgery

## 2024-02-14 ENCOUNTER — Inpatient Hospital Stay (HOSPITAL_COMMUNITY)
Admission: RE | Admit: 2024-02-14 | Discharge: 2024-02-15 | DRG: 220 | Disposition: A | Discharge status: Home Health | Attending: Vascular Surgery | Admitting: Vascular Surgery

## 2024-02-14 ENCOUNTER — Other Ambulatory Visit: Payer: Self-pay

## 2024-02-14 DIAGNOSIS — E782 Mixed hyperlipidemia: Secondary | ICD-10-CM

## 2024-02-14 DIAGNOSIS — E1151 Type 2 diabetes mellitus with diabetic peripheral angiopathy without gangrene: Secondary | ICD-10-CM | POA: Diagnosis not present

## 2024-02-14 DIAGNOSIS — I712 Thoracic aortic aneurysm, without rupture, unspecified: Secondary | ICD-10-CM | POA: Diagnosis not present

## 2024-02-14 DIAGNOSIS — I71012 Dissection of descending thoracic aorta: Secondary | ICD-10-CM

## 2024-02-14 DIAGNOSIS — Z923 Personal history of irradiation: Secondary | ICD-10-CM | POA: Diagnosis not present

## 2024-02-14 DIAGNOSIS — Z95828 Presence of other vascular implants and grafts: Principal | ICD-10-CM

## 2024-02-14 DIAGNOSIS — I7123 Aneurysm of the descending thoracic aorta, without rupture: Principal | ICD-10-CM | POA: Diagnosis present

## 2024-02-14 DIAGNOSIS — I714 Abdominal aortic aneurysm, without rupture, unspecified: Secondary | ICD-10-CM | POA: Diagnosis not present

## 2024-02-14 DIAGNOSIS — I10 Essential (primary) hypertension: Secondary | ICD-10-CM | POA: Diagnosis present

## 2024-02-14 DIAGNOSIS — N390 Urinary tract infection, site not specified: Secondary | ICD-10-CM | POA: Diagnosis not present

## 2024-02-14 DIAGNOSIS — Z8042 Family history of malignant neoplasm of prostate: Secondary | ICD-10-CM

## 2024-02-14 DIAGNOSIS — E119 Type 2 diabetes mellitus without complications: Secondary | ICD-10-CM | POA: Diagnosis not present

## 2024-02-14 DIAGNOSIS — Z8546 Personal history of malignant neoplasm of prostate: Secondary | ICD-10-CM

## 2024-02-14 DIAGNOSIS — Z1624 Resistance to multiple antibiotics: Secondary | ICD-10-CM | POA: Diagnosis present

## 2024-02-14 HISTORY — PX: THORACIC AORTIC ENDOVASCULAR STENT GRAFT: SHX6112

## 2024-02-14 HISTORY — PX: ULTRASOUND GUIDANCE: SHX7651

## 2024-02-14 LAB — COMPREHENSIVE METABOLIC PANEL WITH GFR
ALT: 13 U/L (ref 0–44)
AST: 18 U/L (ref 15–41)
Albumin: 2.5 g/dL — ABNORMAL LOW (ref 3.5–5.0)
Alkaline Phosphatase: 44 U/L (ref 38–126)
Anion gap: 11 (ref 5–15)
BUN: 12 mg/dL (ref 8–23)
CO2: 21 mmol/L — ABNORMAL LOW (ref 22–32)
Calcium: 8.8 mg/dL — ABNORMAL LOW (ref 8.9–10.3)
Chloride: 105 mmol/L (ref 98–111)
Creatinine, Ser: 0.93 mg/dL (ref 0.61–1.24)
GFR, Estimated: >60 mL/min (ref >60)
Glucose, Bld: 113 mg/dL — ABNORMAL HIGH (ref 70–99)
Potassium: 3.5 mmol/L (ref 3.5–5.1)
Sodium: 137 mmol/L (ref 135–145)
Total Bilirubin: 0.7 mg/dL (ref 0.0–1.2)
Total Protein: 6.9 g/dL (ref 6.5–8.1)

## 2024-02-14 LAB — URINALYSIS, ROUTINE W REFLEX MICROSCOPIC
Bilirubin Urine: NEGATIVE
Glucose, UA: NEGATIVE mg/dL
Ketones, ur: NEGATIVE mg/dL
Nitrite: NEGATIVE
Protein, ur: >=300 mg/dL — AB
RBC / HPF: >50 RBC/hpf (ref 0–5)
Specific Gravity, Urine: 1.022 (ref 1.005–1.030)
WBC, UA: >50 WBC/hpf (ref 0–5)
pH: 5 (ref 5.0–8.0)

## 2024-02-14 LAB — APTT
aPTT: 40 s — ABNORMAL HIGH (ref 24–36)
aPTT: 45 s — ABNORMAL HIGH (ref 24–36)

## 2024-02-14 LAB — CBC
HCT: 33.6 % — ABNORMAL LOW (ref 39.0–52.0)
HCT: 29.5 % — ABNORMAL LOW (ref 39.0–52.0)
Hemoglobin: 10.1 g/dL — ABNORMAL LOW (ref 13.0–17.0)
Hemoglobin: 8.8 g/dL — ABNORMAL LOW (ref 13.0–17.0)
MCH: 24.1 pg — ABNORMAL LOW (ref 26.0–34.0)
MCH: 23.7 pg — ABNORMAL LOW (ref 26.0–34.0)
MCHC: 30.1 g/dL (ref 30.0–36.0)
MCHC: 29.8 g/dL — ABNORMAL LOW (ref 30.0–36.0)
MCV: 80.2 fL (ref 80.0–100.0)
MCV: 79.3 fL — ABNORMAL LOW (ref 80.0–100.0)
Platelets: 393 K/uL (ref 150–400)
Platelets: 369 K/uL (ref 150–400)
RBC: 4.19 MIL/uL — ABNORMAL LOW (ref 4.22–5.81)
RBC: 3.72 MIL/uL — ABNORMAL LOW (ref 4.22–5.81)
RDW: 18.5 % — ABNORMAL HIGH (ref 11.5–15.5)
RDW: 18.1 % — ABNORMAL HIGH (ref 11.5–15.5)
WBC: 8.4 K/uL (ref 4.0–10.5)
WBC: 9.5 K/uL (ref 4.0–10.5)
nRBC: 0 % (ref 0.0–0.2)
nRBC: 0 % (ref 0.0–0.2)

## 2024-02-14 LAB — BASIC METABOLIC PANEL WITH GFR
Anion gap: 12 (ref 5–15)
BUN: 12 mg/dL (ref 8–23)
CO2: 22 mmol/L (ref 22–32)
Calcium: 8.7 mg/dL — ABNORMAL LOW (ref 8.9–10.3)
Chloride: 104 mmol/L (ref 98–111)
Creatinine, Ser: 0.99 mg/dL (ref 0.61–1.24)
GFR, Estimated: >60 mL/min (ref >60)
Glucose, Bld: 122 mg/dL — ABNORMAL HIGH (ref 70–99)
Potassium: 3.8 mmol/L (ref 3.5–5.1)
Sodium: 138 mmol/L (ref 135–145)

## 2024-02-14 LAB — TYPE AND SCREEN
ABO/RH(D): A POS
Antibody Screen: NEGATIVE

## 2024-02-14 LAB — SURGICAL PCR SCREEN
MRSA, PCR: NEGATIVE
Staphylococcus aureus: NEGATIVE

## 2024-02-14 LAB — PROTIME-INR
INR: 1.2 (ref 0.8–1.2)
INR: 1.1 (ref 0.8–1.2)
Prothrombin Time: 15.5 s — ABNORMAL HIGH (ref 11.4–15.2)
Prothrombin Time: 15.2 s (ref 11.4–15.2)

## 2024-02-14 LAB — POCT ACTIVATED CLOTTING TIME: Activated Clotting Time: 210 s

## 2024-02-14 LAB — MAGNESIUM: Magnesium: 1.8 mg/dL (ref 1.7–2.4)

## 2024-02-14 SURGERY — INSERTION, ENDOVASCULAR STENT GRAFT, AORTA, THORACIC
Anesthesia: General | Site: Groin | Laterality: Right

## 2024-02-14 MED ORDER — CARVEDILOL 25 MG PO TABS
25.0000 mg | ORAL_TABLET | Freq: Two times a day (BID) | ORAL | Status: DC
Start: 2024-02-14 — End: 2024-02-15
  Administered 2024-02-14 – 2024-02-15 (×2): 25 mg via ORAL
  Filled 2024-02-14 (×2): qty 1

## 2024-02-14 MED ORDER — HYDRALAZINE HCL 50 MG PO TABS
50.0000 mg | ORAL_TABLET | Freq: Three times a day (TID) | ORAL | Status: DC
  Administered 2024-02-14 – 2024-02-15 (×3): 50 mg via ORAL
  Filled 2024-02-14 (×3): qty 1

## 2024-02-14 MED ORDER — ROCURONIUM BROMIDE 10 MG/ML (PF) SYRINGE
PREFILLED_SYRINGE | INTRAVENOUS | Status: DC | PRN
  Administered 2024-02-14: 20 mg via INTRAVENOUS
  Administered 2024-02-14: 60 mg via INTRAVENOUS

## 2024-02-14 MED ORDER — HEPARIN 6000 UNIT IRRIGATION SOLUTION
Status: DC | PRN
  Administered 2024-02-14: 1

## 2024-02-14 MED ORDER — LIDOCAINE 2% (20 MG/ML) 5 ML SYRINGE
INTRAMUSCULAR | Status: DC | PRN
  Administered 2024-02-14: 60 mg via INTRAVENOUS

## 2024-02-14 MED ORDER — DEXAMETHASONE SOD PHOSPHATE PF 10 MG/ML IJ SOLN
INTRAMUSCULAR | Status: DC | PRN
  Administered 2024-02-14: 4 mg via INTRAVENOUS

## 2024-02-14 MED ORDER — PROPOFOL 10 MG/ML IV BOLUS
INTRAVENOUS | Status: DC | PRN
Start: 2024-02-14 — End: 2024-02-14
  Administered 2024-02-14: 200 mg via INTRAVENOUS

## 2024-02-14 MED ORDER — ASPIRIN 81 MG PO TBEC
81.0000 mg | DELAYED_RELEASE_TABLET | Freq: Every day | ORAL | Status: DC
  Administered 2024-02-15: 81 mg via ORAL
  Filled 2024-02-14: qty 1

## 2024-02-14 MED ORDER — HYDRALAZINE HCL 20 MG/ML IJ SOLN: 5.0000 mg | INTRAMUSCULAR | Status: DC | PRN

## 2024-02-14 MED ORDER — PHENOL 1.4 % MT LIQD: 1.0000 | OROMUCOSAL | Status: DC | PRN

## 2024-02-14 MED ORDER — HEPARIN SODIUM (PORCINE) 5000 UNIT/ML IJ SOLN
5000.0000 [IU] | Freq: Two times a day (BID) | INTRAMUSCULAR | Status: DC
  Administered 2024-02-15: 5000 [IU] via SUBCUTANEOUS
  Filled 2024-02-14: qty 1

## 2024-02-14 MED ORDER — MORPHINE SULFATE (PF) 2 MG/ML IV SOLN: 2.0000 mg | INTRAVENOUS | Status: DC | PRN

## 2024-02-14 MED ORDER — IODIXANOL 320 MG/ML IV SOLN
INTRAVENOUS | Status: DC | PRN
  Administered 2024-02-14: 65 mL via INTRA_ARTERIAL

## 2024-02-14 MED ORDER — HEPARIN 6000 UNIT IRRIGATION SOLUTION
Status: AC
  Filled 2024-02-14: qty 500

## 2024-02-14 MED ORDER — PROPOFOL 10 MG/ML IV BOLUS
INTRAVENOUS | Status: AC
  Filled 2024-02-14: qty 20

## 2024-02-14 MED ORDER — SODIUM CHLORIDE 0.9% FLUSH: 10.0000 mL | INTRAVENOUS | Status: DC | PRN

## 2024-02-14 MED ORDER — AMISULPRIDE (ANTIEMETIC) 5 MG/2ML IV SOLN: 10.0000 mg | Freq: Once | INTRAVENOUS | Status: DC | PRN

## 2024-02-14 MED ORDER — CEFAZOLIN SODIUM-DEXTROSE 2-4 GM/100ML-% IV SOLN
2.0000 g | Freq: Three times a day (TID) | INTRAVENOUS | Status: AC
  Administered 2024-02-14 (×2): 2 g via INTRAVENOUS
  Filled 2024-02-14: qty 100

## 2024-02-14 MED ORDER — DEXTROSE 5 % IV SOLN
2.0000 g | Freq: Once | INTRAVENOUS | Status: DC
  Filled 2024-02-14: qty 20

## 2024-02-14 MED ORDER — FENTANYL CITRATE (PF) 250 MCG/5ML IJ SOLN
INTRAMUSCULAR | Status: DC | PRN
  Administered 2024-02-14: 100 ug via INTRAVENOUS

## 2024-02-14 MED ORDER — ORAL CARE MOUTH RINSE: 15.0000 mL | Freq: Once | OROMUCOSAL | Status: AC

## 2024-02-14 MED ORDER — OXYCODONE-ACETAMINOPHEN 5-325 MG PO TABS: 1.0000 | ORAL_TABLET | ORAL | Status: DC | PRN

## 2024-02-14 MED ORDER — HEPARIN SODIUM (PORCINE) 1000 UNIT/ML IJ SOLN
INTRAMUSCULAR | Status: DC | PRN
  Administered 2024-02-14: 9000 [IU] via INTRAVENOUS

## 2024-02-14 MED ORDER — AMLODIPINE BESYLATE 10 MG PO TABS
10.0000 mg | ORAL_TABLET | Freq: Every day | ORAL | Status: DC
  Administered 2024-02-15: 10 mg via ORAL
  Filled 2024-02-14: qty 1

## 2024-02-14 MED ORDER — PHENYLEPHRINE 80 MCG/ML (10ML) SYRINGE FOR IV PUSH (FOR BLOOD PRESSURE SUPPORT)
PREFILLED_SYRINGE | INTRAVENOUS | Status: AC
  Filled 2024-02-14: qty 10

## 2024-02-14 MED ORDER — PROTAMINE SULFATE 10 MG/ML IV SOLN
INTRAVENOUS | Status: DC | PRN
  Administered 2024-02-14: 50 mg via INTRAVENOUS

## 2024-02-14 MED ORDER — LACTATED RINGERS IV SOLN: INTRAVENOUS | Status: DC

## 2024-02-14 MED ORDER — POTASSIUM CHLORIDE CRYS ER 20 MEQ PO TBCR: 40.0000 meq | EXTENDED_RELEASE_TABLET | Freq: Every day | ORAL | Status: DC | PRN

## 2024-02-14 MED ORDER — ACETAMINOPHEN 325 MG RE SUPP: 325.0000 mg | RECTAL | Status: DC | PRN

## 2024-02-14 MED ORDER — CEFAZOLIN SODIUM-DEXTROSE 2-4 GM/100ML-% IV SOLN
2.0000 g | INTRAVENOUS | Status: AC
  Administered 2024-02-14: 2 g via INTRAVENOUS
  Filled 2024-02-14: qty 100

## 2024-02-14 MED ORDER — FENTANYL CITRATE (PF) 100 MCG/2ML IJ SOLN
INTRAMUSCULAR | Status: AC
  Filled 2024-02-14: qty 2

## 2024-02-14 MED ORDER — CHLORHEXIDINE GLUCONATE CLOTH 2 % EX PADS: 6.0000 | MEDICATED_PAD | Freq: Every day | CUTANEOUS | Status: DC

## 2024-02-14 MED ORDER — ONDANSETRON HCL 4 MG/2ML IJ SOLN
INTRAMUSCULAR | Status: DC | PRN
  Administered 2024-02-14: 4 mg via INTRAVENOUS

## 2024-02-14 MED ORDER — 0.9 % SODIUM CHLORIDE (POUR BTL) OPTIME
TOPICAL | Status: DC | PRN
  Administered 2024-02-14: 1000 mL

## 2024-02-14 MED ORDER — CHLORHEXIDINE GLUCONATE 0.12 % MT SOLN
15.0000 mL | Freq: Once | OROMUCOSAL | Status: AC
  Administered 2024-02-14: 15 mL via OROMUCOSAL
  Filled 2024-02-14: qty 15

## 2024-02-14 MED ORDER — SODIUM CHLORIDE 0.9 % IV SOLN
INTRAVENOUS | Status: DC
Start: 2024-02-14 — End: 2024-02-14

## 2024-02-14 MED ORDER — NITROFURANTOIN MONOHYD MACRO 100 MG PO CAPS
100.0000 mg | ORAL_CAPSULE | Freq: Two times a day (BID) | ORAL | Status: DC
Start: 2024-02-14 — End: 2024-02-15
  Administered 2024-02-14 – 2024-02-15 (×2): 100 mg via ORAL
  Filled 2024-02-14 (×3): qty 1

## 2024-02-14 MED ORDER — PANTOPRAZOLE SODIUM 40 MG PO TBEC
40.0000 mg | DELAYED_RELEASE_TABLET | Freq: Two times a day (BID) | ORAL | Status: DC
  Administered 2024-02-14 – 2024-02-15 (×2): 40 mg via ORAL
  Filled 2024-02-14 (×2): qty 1

## 2024-02-14 MED ORDER — CHLORHEXIDINE GLUCONATE CLOTH 2 % EX PADS
6.0000 | MEDICATED_PAD | Freq: Once | CUTANEOUS | Status: DC
Start: 2024-02-14 — End: 2024-02-14

## 2024-02-14 MED ORDER — SODIUM CHLORIDE 0.9 % IV SOLN
2.0000 g | INTRAVENOUS | Status: AC
  Administered 2024-02-14: 2 g via INTRAVENOUS
  Filled 2024-02-14: qty 20

## 2024-02-14 MED ORDER — ACETAMINOPHEN 500 MG PO TABS
1000.0000 mg | ORAL_TABLET | Freq: Once | ORAL | Status: AC
  Administered 2024-02-14: 1000 mg via ORAL
  Filled 2024-02-14: qty 2

## 2024-02-14 MED ORDER — NOREPINEPHRINE 4 MG/250ML-% IV SOLN
INTRAVENOUS | Status: DC | PRN
Start: 2024-02-14 — End: 2024-02-14
  Administered 2024-02-14: 2 ug/min via INTRAVENOUS

## 2024-02-14 MED ORDER — PHENYLEPHRINE HCL-NACL 20-0.9 MG/250ML-% IV SOLN
INTRAVENOUS | Status: DC | PRN
  Administered 2024-02-14: 50 ug/min via INTRAVENOUS

## 2024-02-14 MED ORDER — ROSUVASTATIN CALCIUM 5 MG PO TABS
5.0000 mg | ORAL_TABLET | ORAL | Status: DC
Start: 2024-02-14 — End: 2024-02-15
  Administered 2024-02-14: 5 mg via ORAL
  Filled 2024-02-14: qty 1

## 2024-02-14 MED ORDER — SODIUM CHLORIDE 0.9 % IV SOLN
1.0000 g | INTRAVENOUS | Status: DC
  Filled 2024-02-14: qty 10

## 2024-02-14 MED ORDER — ACETAMINOPHEN 325 MG PO TABS: 325.0000 mg | ORAL_TABLET | ORAL | Status: DC | PRN

## 2024-02-14 MED ORDER — CHLORHEXIDINE GLUCONATE CLOTH 2 % EX PADS: 6.0000 | MEDICATED_PAD | Freq: Once | CUTANEOUS | Status: DC

## 2024-02-14 MED ORDER — POLYETHYLENE GLYCOL 3350 17 GM/SCOOP PO POWD
17.0000 g | Freq: Every day | ORAL | Status: DC
  Filled 2024-02-14: qty 119

## 2024-02-14 MED ORDER — PANTOPRAZOLE SODIUM 40 MG PO TBEC: 40.0000 mg | DELAYED_RELEASE_TABLET | Freq: Every day | ORAL | Status: DC

## 2024-02-14 MED ORDER — SUGAMMADEX SODIUM 200 MG/2ML IV SOLN
INTRAVENOUS | Status: DC | PRN
  Administered 2024-02-14: 200 mg via INTRAVENOUS

## 2024-02-14 MED ORDER — SODIUM CHLORIDE 0.9% FLUSH
10.0000 mL | Freq: Two times a day (BID) | INTRAVENOUS | Status: DC
  Administered 2024-02-14 – 2024-02-15 (×2): 10 mL

## 2024-02-14 MED ORDER — DOCUSATE SODIUM 100 MG PO CAPS
100.0000 mg | ORAL_CAPSULE | Freq: Every day | ORAL | Status: DC
  Administered 2024-02-15: 100 mg via ORAL
  Filled 2024-02-14: qty 1

## 2024-02-14 MED ORDER — METOPROLOL TARTRATE 5 MG/5ML IV SOLN: 2.5000 mg | INTRAVENOUS | Status: DC | PRN

## 2024-02-14 MED ORDER — POLYETHYLENE GLYCOL 3350 17 G PO PACK: 17.0000 g | PACK | Freq: Every day | ORAL | Status: DC | PRN

## 2024-02-14 MED ORDER — PHENYLEPHRINE 80 MCG/ML (10ML) SYRINGE FOR IV PUSH (FOR BLOOD PRESSURE SUPPORT)
PREFILLED_SYRINGE | INTRAVENOUS | Status: DC | PRN
  Administered 2024-02-14 (×3): 160 ug via INTRAVENOUS
  Administered 2024-02-14 (×2): 80 ug via INTRAVENOUS
  Administered 2024-02-14: 160 ug via INTRAVENOUS
  Administered 2024-02-14: 80 ug via INTRAVENOUS

## 2024-02-14 MED ORDER — CEFAZOLIN SODIUM-DEXTROSE 2-4 GM/100ML-% IV SOLN
INTRAVENOUS | Status: AC
  Filled 2024-02-14: qty 100

## 2024-02-14 MED ORDER — ALBUTEROL SULFATE HFA 108 (90 BASE) MCG/ACT IN AERS: 2.0000 | INHALATION_SPRAY | RESPIRATORY_TRACT | Status: DC | PRN

## 2024-02-14 MED ORDER — LISINOPRIL 20 MG PO TABS: 20.0000 mg | ORAL_TABLET | Freq: Every day | ORAL | Status: DC

## 2024-02-14 MED ORDER — FENTANYL CITRATE (PF) 100 MCG/2ML IJ SOLN: 25.0000 ug | INTRAMUSCULAR | Status: DC | PRN

## 2024-02-14 MED ORDER — LABETALOL HCL 5 MG/ML IV SOLN: 10.0000 mg | INTRAVENOUS | Status: DC | PRN

## 2024-02-14 MED ORDER — NITROFURANTOIN MONOHYD MACRO 100 MG PO CAPS
100.0000 mg | ORAL_CAPSULE | Freq: Two times a day (BID) | ORAL | Status: DC
  Filled 2024-02-14: qty 1

## 2024-02-14 SURGICAL SUPPLY — 37 items
BAG COUNTER SPONGE SURGICOUNT (BAG) ×2 IMPLANT
CANISTER SUCTION 3000ML PPV (SUCTIONS) ×2 IMPLANT
CATH ACCU-VU SIZ PIG 5F 100CM (CATHETERS) IMPLANT
CATH VISIONS PV .035 IVUS (CATHETERS) IMPLANT
CLIP TI MEDIUM 6 (CLIP) ×2 IMPLANT
CLIP TI WIDE RED SMALL 6 (CLIP) ×2 IMPLANT
DERMABOND ADVANCED .7 DNX12 (GAUZE/BANDAGES/DRESSINGS) ×2 IMPLANT
DEVICE CLOSURE PERCLS PRGLD 6F (Vascular Products) ×4 IMPLANT
DRSG TEGADERM 2-3/8X2-3/4 SM (GAUZE/BANDAGES/DRESSINGS) ×2 IMPLANT
ELECTRODE REM PT RTRN 9FT ADLT (ELECTROSURGICAL) ×4 IMPLANT
GLOVE BIOGEL PI IND STRL 8 (GLOVE) ×2 IMPLANT
GOWN STRL NON-REIN LRG LVL3 (GOWN DISPOSABLE) ×2 IMPLANT
GOWN STRL REUS W/ TWL LRG LVL3 (GOWN DISPOSABLE) ×2 IMPLANT
GOWN STRL REUS W/TWL 2XL LVL3 (GOWN DISPOSABLE) ×2 IMPLANT
KIT BASIN OR (CUSTOM PROCEDURE TRAY) ×2 IMPLANT
KIT TURNOVER KIT B (KITS) ×2 IMPLANT
PACK ENDOVASCULAR (PACKS) ×2 IMPLANT
PAD ARMBOARD POSITIONER FOAM (MISCELLANEOUS) ×4 IMPLANT
SET MICROPUNCTURE 5F STIFF (MISCELLANEOUS) ×2 IMPLANT
SHEATH AVANTI 11CM 8FR (SHEATH) ×2 IMPLANT
SHEATH DRYSEAL FLEX 20FR 33CM (SHEATH) IMPLANT
SLEEVE ISOL F/PACE RF HD COVER (MISCELLANEOUS) IMPLANT
SOLN 0.9% NACL POUR BTL 1000ML (IV SOLUTION) ×4 IMPLANT
STENT GRFT THORAC ACS 31X31X20 (Endovascular Graft) IMPLANT
STOPCOCK MORSE 400PSI 3WAY (MISCELLANEOUS) ×2 IMPLANT
SUT MNCRL AB 4-0 PS2 18 (SUTURE) ×2 IMPLANT
SUT PROLENE 5 0 C 1 24 (SUTURE) IMPLANT
SUT VIC AB 2-0 CTX 36 (SUTURE) IMPLANT
SUT VIC AB 3-0 SH 27X BRD (SUTURE) IMPLANT
SYR 30ML LL (SYRINGE) IMPLANT
SYR 50ML LL SCALE MARK (SYRINGE) ×2 IMPLANT
TOWEL GREEN STERILE (TOWEL DISPOSABLE) ×2 IMPLANT
TRAY FOLEY MTR SLVR 14FR STAT (SET/KITS/TRAYS/PACK) IMPLANT
TRAY FOLEY MTR SLVR 16FR STAT (SET/KITS/TRAYS/PACK) ×2 IMPLANT
TUBING HIGH PRESSURE 120CM (CONNECTOR) ×2 IMPLANT
WIRE BENTSON .035X145CM (WIRE) ×2 IMPLANT
WIRE STIFF LUNDERQUIST 260CM (WIRE) ×2 IMPLANT

## 2024-02-14 NOTE — Op Note (Signed)
 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  Predeployment, post deployment Deployment of thoracic endovascular aortic stent graft without left subclavian artery coverage-31 x 20 cm Gore TAG conformable  SURGEON: Fonda FORBES Rim  ASSIST: Donnice Sender, PA  ANESTHESIA: General  EBL: 20 mL  INDICATIONS:    Nicholas Burke is a 78 y.o. male with history of aortic intramural hematoma extending from the descending thoracic aorta into the infrarenal aorta.  At the time of his initial presentation, antiimpulse control was initiated, and the patient was asymptomatic.  He presented to my office with aneurysmal degeneration of the descending thoracic aorta with a landing zone of 20 mm immediately distal to the left subclavian.  After discussing the risks and benefits of thoracic endovascular aortic repair to prevent further aneurysmal degeneration, Nicholas Burke elected to proceed.  FINDINGS:   Widely patent aortic arch with aneurysmal degeneration appreciated 2 cm from the left subclavian artery.  TECHNIQUE:   Patient is brought to the OR laid in supine position.  General anesthesia was induced and the patient's prepped draped in standard fashion.  The case began with ultrasound-guided micropuncture access of the right common femoral artery in retrograde fashion.  Wire was run into the infrarenal aorta, and the micropuncture needle exchanged for a micropuncture sheath.  Again, in standard fashion a 0.035 wire was used in Seldinger technique to exchange the micropuncture sheath to an 8 [place] sheath.  This was removed, and 2 Perclose  devices were placed in preclose fashion.  Next, a wire was run into the descending thoracic aorta.  The patient was heparinized and the wire then advanced into the ascending aorta.  Over this wire, intravascular ultrasound was utilized to assess the residual intramural hematoma, as well as the takeoff of the left subclavian artery, left carotid artery, innominate artery.  These were marked.  Next, the Bentson wire was exchanged for a double curve Lunderquist wire.  Over this Lunderquist wire the 8 French sheath was removed, and the 20 [place] Gore dry seal sheath brought to the field and placed in the visceral aorta.  Next, the device was prepped in standard fashion and laid in the descending aorta over the wire.  A buddy wire was used and a pigtail catheter was then placed into the ascending aorta.  Under breath-hold's, arch angiogram followed.  We ensured that we had the proper parallax.  Next, the 31 x 20 cm Gore tag endoprosthesis was deployed in standard fashion immediately distal to the left subclavian artery.  Follow-up angiography demonstrated excellent result with no filling of the aneurysm.  I was happy with this result.  I elected to use intravascular ultrasound again over the wire to evaluate the stent graft and the location of the left subclavian artery.  The graft was immediately distal to the artery.  At this point, wires and catheters were removed.  The 20 French sheath was pulled and over a wire, the Pro-glide's deployed.  I elected to perform angiography of my access site to ensure that there were no complications.  There were not.  The external iliac artery, common femoral artery, proximal femoral, proximal superficial femoral artery were widely  patent.  The patient had a palpable pulse in the foot.  50 mg of protamine were administered.  The access site was closed using Monocryl suture, and the patient was taken the PACU in stable condition.  Fonda FORBES Rim, MD [facility] DATE OF DICTATION:   02/14/2024

## 2024-02-14 NOTE — Anesthesia Procedure Notes (Signed)
 Procedure Name: Intubation Date/Time: 02/14/2024 7:56 AM  Performed by: Marva Lonni PARAS, CRNAPre-anesthesia Checklist: Patient identified, Emergency Drugs available, Suction available and Patient being monitored Patient Re-evaluated:Patient Re-evaluated prior to induction Oxygen Delivery Method: Circle System Utilized Preoxygenation: Pre-oxygenation with 100% oxygen Induction Type: IV induction Ventilation: Mask ventilation without difficulty Laryngoscope Size: Glidescope and 4 Grade View: Grade I Tube type: Oral Tube size: 7.5 mm Number of attempts: 1 Airway Equipment and Method: Stylet and Oral airway Placement Confirmation: ETT inserted through vocal cords under direct vision, positive ETCO2 and breath sounds checked- equal and bilateral Secured at: 23 cm Tube secured with: Tape Dental Injury: Teeth and Oropharynx as per pre-operative assessment  Comments: 1st attempt by SRNA with MAC 4 blade -- Grade III view/unable to obtain view of cords   2nd attempt by SRNA with Glidescope S4 blade -- Grade I view -- successful, atraumatic intubation  Patient masked between attempts and vital signs stable throughout induction sequence

## 2024-02-14 NOTE — H&P (Signed)
 Office Note   Patient seen and examined in preop holding.  No complaints. No changes to medication history or physical exam since last seen in clinic. After discussing the risks and benefits of TEVAR for degenerated intramural hematoma, Nicholas Burke elected to proceed.  UA last week was dirty.  The patient has been on antibiotics since.  States his urinary symptoms are much better.  Will be given preoperative antibiotics today.  We discussed continuing the case being that he has been on antibiotics versus canceling that.  Both he and I are worried about further degeneration in the interim.  Elected to move forward.  Will be treated with antibiotics post TEVAR as well.  Nicholas Burke   CC: Descending thoracic intramural hematoma Requesting Provider:  No ref. provider found  HPI: Nicholas Burke is a 78 y.o. (Dec 15, 1945) male presenting in follow-up with descending thoracic intramural hematoma from uncontrolled hypertension.  He was initially seen in the hospital with intramural hematoma.  This was medically managed, and the patient was asymptomatic, therefore we did not move forward with intervention.  On exam today, Hagen was doing well, accompanied by his wife.  He states he feels like he is back to his normal self outside of feeling a little more tired than usual.  He feels like his appetite is also improved significantly. Denies back pain, chest pain, abdominal pain Denies symptoms of claudication, ischemic rest pain, tissue loss  Past Medical History:  Diagnosis Date   AAA (abdominal aortic aneurysm)    Hx of radiation therapy 01/02/13- 02/24/13   prostate 7800 cGy/40 sessions, seminal vesicles 5600 cGy/40 sessions   Hypertension    Pre-diabetes    Prostate cancer (HCC) 08/30/2012   gleason 6, vol 65.7 cc    Past Surgical History:  Procedure Laterality Date   CATARACT EXTRACTION, BILATERAL Bilateral    COLONOSCOPY WITH PROPOFOL  N/A 10/18/2014   Procedure: COLONOSCOPY WITH  PROPOFOL ;  Surgeon: Nicholas Sol, Burke;  Location: [facility] ENDOSCOPY;  Service: Endoscopy;  Laterality: N/A;   CRYOABLATION N/A 12/24/2020   Procedure: CRYO ABLATION PROSTATE;  Surgeon: Nicholas Cough, Burke;  Location: [facility] ORS;  Service: Urology;  Laterality: N/A;   ESOPHAGOGASTRODUODENOSCOPY N/A 12/20/2023   Procedure: EGD (ESOPHAGOGASTRODUODENOSCOPY);  Surgeon: Nicholas Kitchens, Burke;  Location: [facility] ENDOSCOPY;  Service: Gastroenterology;  Laterality: N/A;   ESOPHAGOGASTRODUODENOSCOPY N/A 12/26/2023   Procedure: EGD (ESOPHAGOGASTRODUODENOSCOPY);  Surgeon: Nicholas Jasper, Burke;  Location: [facility] ENDOSCOPY;  Service: Gastroenterology;  Laterality: N/A;   EYE SURGERY Bilateral    cataract surgery   HOT HEMOSTASIS N/A 10/18/2014   Procedure: HOT HEMOSTASIS (ARGON PLASMA COAGULATION/BICAP);  Surgeon: Nicholas Sol, Burke;  Location: [facility];  Service: Endoscopy;  Laterality: N/A;   PROSTATE BIOPSY  08/2012    Social History   Socioeconomic History   Marital status: Married    Spouse name: Not on file   Number of children: 4   Years of education: Not on file   Highest education level: Not on file  Occupational History    Employer: [employer]  Tobacco Use   Smoking status: Never   Smokeless tobacco: Never  Vaping Use   Vaping status: Never Used  Substance and Sexual Activity   Alcohol use: No   Drug use: No   Sexual activity: Not on file  Other Topics Concern   Not on file  Social History Narrative   Not on file   Social Drivers of Health   Financial Resource Strain: Not on file  Food Insecurity: No  Food Insecurity (12/06/2023)   Hunger Vital Sign    Worried About Running Out of Food in the Last Year: Never true    Ran Out of Food in the Last Year: Never true  Transportation Needs: No Transportation Needs (12/06/2023)   PRAPARE - Administrator, Civil Service (Medical): No    Lack of Transportation (Non-Medical): No  Physical Activity: Not on file  Stress: Not on file  Social  Connections: Socially Integrated (12/06/2023)   Social Connection and Isolation Panel    Frequency of Communication with Friends and Family: Three times a week    Frequency of Social Gatherings with Friends and Family: Three times a week    Attends Religious Services: More than 4 times per year    Active Member of Clubs or Organizations: Yes    Attends Banker Meetings: More than 4 times per year    Marital Status: Married  Catering manager Violence: Not At Risk (12/06/2023)   Humiliation, Afraid, Rape, and Kick questionnaire    Fear of Current or Ex-Partner: No    Emotionally Abused: No    Physically Abused: No    Sexually Abused: No   Family History  Problem Relation Age of Onset   Prostate cancer Father    Prostate cancer Brother     Current Facility-Administered Medications  Medication Dose Route Frequency Provider Last Rate Last Admin   0.9 %  sodium chloride  infusion   Intravenous Continuous [name], Burke       ceFAZolin  (ANCEF ) IVPB 2g/100 mL premix  2 g Intravenous 30 min Pre-Op Nicholas Burke E, Burke       cefTRIAXone (ROCEPHIN) 2 g in dextrose  5 % 50 mL IVPB  2 g Intravenous Once Nicholas Burke E, Burke       Chlorhexidine  Gluconate Cloth 2 % PADS 6 each  6 each Topical Once Nicholas Burke E, Burke       And   Chlorhexidine  Gluconate Cloth 2 % PADS 6 each  6 each Topical Once Nicholas Burke E, Burke       lactated ringers  infusion   Intravenous Continuous Nicholas Charleston, Burke 10 mL/hr at 02/14/24 0617 New Bag at 02/14/24 0617    No Known Allergies   REVIEW OF SYSTEMS:  [X]  denotes positive finding, [ ]  denotes negative finding Cardiac  Comments:  Chest pain or chest pressure:    Shortness of breath upon exertion:    Short of breath when lying flat:    Irregular heart rhythm:        Vascular    Pain in calf, thigh, or hip brought on by ambulation:    Pain in feet at night that wakes you up from your sleep:     Blood clot in your veins:    Leg swelling:          Pulmonary    Oxygen at home:    Productive Burke:     Wheezing:         Neurologic    Sudden weakness in arms or legs:     Sudden numbness in arms or legs:     Sudden onset of difficulty speaking or slurred speech:    Temporary loss of vision in one eye:     Problems with dizziness:         Gastrointestinal    Blood in stool:     Vomited blood:         Genitourinary  Burning when urinating:     Blood in urine:        Psychiatric    Major depression:         Hematologic    Bleeding problems:    Problems with blood clotting too easily:        Skin    Rashes or ulcers:        Constitutional    Fever or chills:      PHYSICAL EXAMINATION:  Vitals:   02/11/24 1555 02/14/24 0600 02/14/24 0603  BP:  125/81   Pulse:  88   Resp:  18   Temp:   98.6 F (37 C)  SpO2:  97%   Weight: 86.2 kg    Height: 5' 10 (1.778 m)      General:  WDWN in NAD; vital signs documented above Gait: Not observed HENT: WNL, normocephalic Pulmonary: normal non-labored breathing , without wheezing Cardiac: regular HR Abdomen: soft, NT, no masses Skin: without rashes Vascular Exam/Pulses:  Right Left  Radial 2+ (normal) 2+ (normal)              DP 2+ (normal) 2+ (normal)       Extremities: without ischemic changes, without Gangrene , without cellulitis; without open wounds;  Musculoskeletal: no muscle wasting or atrophy  Neurologic: A&O X 3;  No focal weakness or paresthesias are detected Psychiatric:  The pt has Normal affect.   Non-Invasive Vascular Imaging:   New CTA demonstrated 5 cm descending thoracic aorta.  This has had significant aneurysmal degeneration over the last month.    ASSESSMENT/PLAN: Chayson Charters is a 78 y.o. male presenting with descending aorta intramural hematoma.  He presents for 1 month follow-up to assess for aneurysmal degeneration versus aortic healing.  Imaging was reviewed.  He has had significant degeneration, now 5 cm in size from 4 cm.   1 cm growth over the last month necessitates repair.  I discussed the above with Oleg and his wife.  He would benefit from thoracic endovascular aortic repair.   After discussing the risks and benefits, Niraj elected to proceed.  I would appreciate cardiac clearance prior to moving forward with surgery.  Surgery will be scheduled following clearance.    Nicholas FORBES Rim, Burke [facility] [phone]

## 2024-02-14 NOTE — Progress Notes (Signed)
 Office Note   Patient seen and examined in preop holding.  No complaints. No changes to medication history or physical exam since last seen in clinic. After discussing the risks and benefits of TEVAR for degenerated intramural hematoma, Nicholas Burke elected to proceed.  UA last week was dirty.  The patient has been on antibiotics since.  States his urinary symptoms are much better.  Will be given preoperative antibiotics today.  We discussed continuing the case being that he has been on antibiotics versus canceling that.  Both he and I are worried about further degeneration in the interim.  Elected to move forward.  Will be treated with antibiotics post TEVAR as well.  [name]   CC: Descending thoracic intramural hematoma Requesting Provider:  No ref. provider found  HPI: Nicholas Burke is a 78 y.o. (Dec 15, 1945) male presenting in follow-up with descending thoracic intramural hematoma from uncontrolled hypertension.  He was initially seen in the hospital with intramural hematoma.  This was medically managed, and the patient was asymptomatic, therefore we did not move forward with intervention.  On exam today, Hagen was doing well, accompanied by his wife.  He states he feels like he is back to his normal self outside of feeling a little more tired than usual.  He feels like his appetite is also improved significantly. Denies back pain, chest pain, abdominal pain Denies symptoms of claudication, ischemic rest pain, tissue loss  Past Medical History:  Diagnosis Date   AAA (abdominal aortic aneurysm)    Hx of radiation therapy 01/02/13- 02/24/13   prostate 7800 cGy/40 sessions, seminal vesicles 5600 cGy/40 sessions   Hypertension    Pre-diabetes    Prostate cancer (HCC) 08/30/2012   gleason 6, vol 65.7 cc    Past Surgical History:  Procedure Laterality Date   CATARACT EXTRACTION, BILATERAL Bilateral    COLONOSCOPY WITH PROPOFOL  N/A 10/18/2014   Procedure: COLONOSCOPY WITH  PROPOFOL ;  Surgeon: [name], MD;  Location: [facility] ENDOSCOPY;  Service: Endoscopy;  Laterality: N/A;   CRYOABLATION N/A 12/24/2020   Procedure: CRYO ABLATION PROSTATE;  Surgeon: [name], MD;  Location: [facility]S;  Service: Urology;  Laterality: N/A;   ESOPHAGOGASTRODUODENOSCOPY N/A 12/20/2023   Procedure: EGD (ESOPHAGOGASTRODUODENOSCOPY);  Surgeon: [name], MD;  Location: [facility] ENDOSCOPY;  Service: Gastroenterology;  Laterality: N/A;   ESOPHAGOGASTRODUODENOSCOPY N/A 12/26/2023   Procedure: EGD (ESOPHAGOGASTRODUODENOSCOPY);  Surgeon: [name], MD;  Location: [facility] ENDOSCOPY;  Service: Gastroenterology;  Laterality: N/A;   EYE SURGERY Bilateral    cataract surgery   HOT HEMOSTASIS N/A 10/18/2014   Procedure: HOT HEMOSTASIS (ARGON PLASMA COAGULATION/BICAP);  Surgeon: [name], MD;  Location: [facility];  Service: Endoscopy;  Laterality: N/A;   PROSTATE BIOPSY  08/2012    Social History   Socioeconomic History   Marital status: Married    Spouse name: Not on file   Number of children: 4   Years of education: Not on file   Highest education level: Not on file  Occupational History    Employer: [employer]  Tobacco Use   Smoking status: Never   Smokeless tobacco: Never  Vaping Use   Vaping status: Never Used  Substance and Sexual Activity   Alcohol use: No   Drug use: No   Sexual activity: Not on file  Other Topics Concern   Not on file  Social History Narrative   Not on file   Social Drivers of Health   Financial Resource Strain: Not on file  Food Insecurity: No  Food Insecurity (12/06/2023)   Hunger Vital Sign    Worried About Running Out of Food in the Last Year: Never true    Ran Out of Food in the Last Year: Never true  Transportation Needs: No Transportation Needs (12/06/2023)   PRAPARE - Administrator, Civil Service (Medical): No    Lack of Transportation (Non-Medical): No  Physical Activity: Not on file  Stress: Not on file  Social  Connections: Socially Integrated (12/06/2023)   Social Connection and Isolation Panel    Frequency of Communication with Friends and Family: Three times a week    Frequency of Social Gatherings with Friends and Family: Three times a week    Attends Religious Services: More than 4 times per year    Active Member of Clubs or Organizations: Yes    Attends Banker Meetings: More than 4 times per year    Marital Status: Married  Catering manager Violence: Not At Risk (12/06/2023)   Humiliation, Afraid, Rape, and Kick questionnaire    Fear of Current or Ex-Partner: No    Emotionally Abused: No    Physically Abused: No    Sexually Abused: No   Family History  Problem Relation Age of Onset   Prostate cancer Father    Prostate cancer Brother     Current Facility-Administered Medications  Medication Dose Route Frequency Provider Last Rate Last Admin   0.9 %  sodium chloride  infusion   Intravenous Continuous [name], MD       ceFAZolin  (ANCEF ) IVPB 2g/100 mL premix  2 g Intravenous 30 min Pre-Op [name], MD       cefTRIAXone (ROCEPHIN) 2 g in dextrose  5 % 50 mL IVPB  2 g Intravenous Once [name], MD       Chlorhexidine  Gluconate Cloth 2 % PADS 6 each  6 each Topical Once [name], MD       And   Chlorhexidine  Gluconate Cloth 2 % PADS 6 each  6 each Topical Once [name], MD       lactated ringers  infusion   Intravenous Continuous [name], MD 10 mL/hr at 02/14/24 0617 New Bag at 02/14/24 0617    No Known Allergies   REVIEW OF SYSTEMS:  [X]  denotes positive finding, [ ]  denotes negative finding Cardiac  Comments:  Chest pain or chest pressure:    Shortness of breath upon exertion:    Short of breath when lying flat:    Irregular heart rhythm:        Vascular    Pain in calf, thigh, or hip brought on by ambulation:    Pain in feet at night that wakes you up from your sleep:     Blood clot in your veins:    Leg swelling:          Pulmonary    Oxygen at home:    Productive cough:     Wheezing:         Neurologic    Sudden weakness in arms or legs:     Sudden numbness in arms or legs:     Sudden onset of difficulty speaking or slurred speech:    Temporary loss of vision in one eye:     Problems with dizziness:         Gastrointestinal    Blood in stool:     Vomited blood:         Genitourinary  Burning when urinating:     Blood in urine:        Psychiatric    Major depression:         Hematologic    Bleeding problems:    Problems with blood clotting too easily:        Skin    Rashes or ulcers:        Constitutional    Fever or chills:      PHYSICAL EXAMINATION:  Vitals:   02/11/24 1555 02/14/24 0600 02/14/24 0603  BP:  125/81   Pulse:  88   Resp:  18   Temp:   98.6 F (37 C)  SpO2:  97%   Weight: 86.2 kg    Height: 5' 10 (1.778 m)      General:  WDWN in NAD; vital signs documented above Gait: Not observed HENT: WNL, normocephalic Pulmonary: normal non-labored breathing , without wheezing Cardiac: regular HR Abdomen: soft, NT, no masses Skin: without rashes Vascular Exam/Pulses:  Right Left  Radial 2+ (normal) 2+ (normal)              DP 2+ (normal) 2+ (normal)       Extremities: without ischemic changes, without Gangrene , without cellulitis; without open wounds;  Musculoskeletal: no muscle wasting or atrophy  Neurologic: A&O X 3;  No focal weakness or paresthesias are detected Psychiatric:  The pt has Normal affect.   Non-Invasive Vascular Imaging:   New CTA demonstrated 5 cm descending thoracic aorta.  This has had significant aneurysmal degeneration over the last month.    ASSESSMENT/PLAN: Chayson Charters is a 78 y.o. male presenting with descending aorta intramural hematoma.  He presents for 1 month follow-up to assess for aneurysmal degeneration versus aortic healing.  Imaging was reviewed.  He has had significant degeneration, now 5 cm in size from 4 cm.   1 cm growth over the last month necessitates repair.  I discussed the above with Oleg and his wife.  He would benefit from thoracic endovascular aortic repair.   After discussing the risks and benefits, Niraj elected to proceed.  I would appreciate cardiac clearance prior to moving forward with surgery.  Surgery will be scheduled following clearance.    Fonda FORBES Rim, MD [facility] [phone]

## 2024-02-14 NOTE — Anesthesia Procedure Notes (Signed)
 Arterial Line Insertion Start/End10/20/2025 7:10 AM, 02/14/2024 7:26 AM Performed by: [name], MD, anesthesiologist  Preanesthetic checklist: patient identified, IV checked, site marked, risks and benefits discussed, surgical consent, monitors and equipment checked, pre-op evaluation, timeout performed and anesthesia consent Lidocaine  1% used for infiltration Right, radial was placed Catheter size: 20 G Hand hygiene performed  and Seldinger technique used Allen's test indicative of satisfactory collateral circulation Attempts: 3 Procedure performed using ultrasound to evaluate access site. Ultrasound Notes:relevant anatomy identified, ultrasound used to visualize needle entry, vessel patent under ultrasound and image(s) printed for medical record. Following insertion, dressing applied and Biopatch. Post procedure assessment: normal  Post procedure complications: second provider assisted and unsuccessful attempts. Patient tolerated the procedure well with no immediate complications.

## 2024-02-14 NOTE — Progress Notes (Signed)
 Pt arrived from ...cath.., A/ox 4...pt denies any pain, MD aware,CCMD called. CHG bath given,no further needs at this time

## 2024-02-14 NOTE — Transfer of Care (Signed)
 Immediate Anesthesia Transfer of Care Note  Patient: Nicholas Burke  Procedure(s) Performed: INSERTION, ENDOVASCULAR STENT GRAFT, AORTA, THORACIC (Right: Groin) ULTRASOUND GUIDANCE (Right: Groin)  Patient Location: PACU  Anesthesia Type:General  Level of Consciousness: drowsy and patient cooperative  Airway & Oxygen Therapy: Patient Spontanous Breathing and Patient connected to nasal cannula oxygen  Post-op Assessment: Report given to RN and Post -op Vital signs reviewed and stable  Post vital signs: Reviewed and stable  Last Vitals:  Vitals Value Taken Time  BP 133/92   Temp 37 C   Pulse 75   Resp 14   SpO2 95%     Last Pain:  Vitals:   02/14/24 0600  PainSc: 0-No pain         Complications: No notable events documented.

## 2024-02-15 ENCOUNTER — Encounter (HOSPITAL_COMMUNITY): Payer: Self-pay | Admitting: Vascular Surgery

## 2024-02-15 ENCOUNTER — Other Ambulatory Visit (HOSPITAL_COMMUNITY): Payer: Self-pay

## 2024-02-15 DIAGNOSIS — Z95828 Presence of other vascular implants and grafts: Secondary | ICD-10-CM

## 2024-02-15 LAB — CBC
HCT: 32.3 % — ABNORMAL LOW (ref 39.0–52.0)
Hemoglobin: 9.9 g/dL — ABNORMAL LOW (ref 13.0–17.0)
MCH: 24 pg — ABNORMAL LOW (ref 26.0–34.0)
MCHC: 30.7 g/dL (ref 30.0–36.0)
MCV: 78.2 fL — ABNORMAL LOW (ref 80.0–100.0)
Platelets: 473 K/uL — ABNORMAL HIGH (ref 150–400)
RBC: 4.13 MIL/uL — ABNORMAL LOW (ref 4.22–5.81)
RDW: 18.2 % — ABNORMAL HIGH (ref 11.5–15.5)
WBC: 16.2 K/uL — ABNORMAL HIGH (ref 4.0–10.5)
nRBC: 0 % (ref 0.0–0.2)

## 2024-02-15 LAB — BASIC METABOLIC PANEL WITH GFR
Anion gap: 11 (ref 5–15)
BUN: 23 mg/dL (ref 8–23)
CO2: 22 mmol/L (ref 22–32)
Calcium: 9.1 mg/dL (ref 8.9–10.3)
Chloride: 103 mmol/L (ref 98–111)
Creatinine, Ser: 1.29 mg/dL — ABNORMAL HIGH (ref 0.61–1.24)
GFR, Estimated: 57 mL/min — ABNORMAL LOW (ref >60)
Glucose, Bld: 149 mg/dL — ABNORMAL HIGH (ref 70–99)
Potassium: 4.2 mmol/L (ref 3.5–5.1)
Sodium: 136 mmol/L (ref 135–145)

## 2024-02-15 MED ORDER — ASPIRIN 81 MG PO TBEC: 81.0000 mg | DELAYED_RELEASE_TABLET | Freq: Every day | ORAL | Status: AC

## 2024-02-15 MED ORDER — OXYCODONE-ACETAMINOPHEN 5-325 MG PO TABS
1.0000 | ORAL_TABLET | Freq: Four times a day (QID) | ORAL | 0 refills | Status: DC | PRN
  Filled 2024-02-15: qty 10, 3d supply, fill #0

## 2024-02-15 NOTE — Anesthesia Postprocedure Evaluation (Signed)
 Anesthesia Post Note  Patient: Nicholas Burke  Procedure(s) Performed: INSERTION, ENDOVASCULAR STENT GRAFT, AORTA, THORACIC (Right: Groin) ULTRASOUND GUIDANCE (Right: Groin)     Patient location during evaluation: PACU Anesthesia Type: General Level of consciousness: awake and alert Pain management: pain level controlled Vital Signs Assessment: post-procedure vital signs reviewed and stable Respiratory status: spontaneous breathing, nonlabored ventilation, respiratory function stable and patient connected to nasal cannula oxygen Cardiovascular status: blood pressure returned to baseline and stable Postop Assessment: no apparent nausea or vomiting Anesthetic complications: no   No notable events documented.  Last Vitals:  Vitals:   02/14/24 2338 02/15/24 0347  BP: 108/67 107/70  Pulse: 92 88  Resp: 20 20  Temp: 36.9 C 36.7 C  SpO2: 95% 95%    Last Pain:  Vitals:   02/15/24 0347  TempSrc: Oral  PainSc:                  Navi Erber E

## 2024-02-15 NOTE — Progress Notes (Signed)
 Patients a-line was no longer in the vein. RN unable to get labs.

## 2024-02-15 NOTE — Progress Notes (Addendum)
  Progress Note    02/15/2024 8:37 AM 1 Day Post-Op  Subjective:  no complaints   Vitals:   02/15/24 0347 02/15/24 0821  BP: 107/70 117/78  Pulse: 88 83  Resp: 20 16  Temp: 98.1 F (36.7 C) 98.2 F (36.8 C)  SpO2: 95% 99%   Physical Exam: Lungs:  non labored Incisions:  R groin without hematoma Extremities:  palpable DP pulses BLE; moving all ext well Abdomen:  soft, NT, ND Neurologic: A&O  CBC    Component Value Date/Time   WBC 9.5 02/14/2024 1000   RBC 3.72 (L) 02/14/2024 1000   HGB 8.8 (L) 02/14/2024 1000   HCT 29.5 (L) 02/14/2024 1000   PLT 369 02/14/2024 1000   MCV 79.3 (L) 02/14/2024 1000   MCH 23.7 (L) 02/14/2024 1000   MCHC 29.8 (L) 02/14/2024 1000   RDW 18.1 (H) 02/14/2024 1000   LYMPHSABS 1.0 01/01/2024 0500   MONOABS 0.7 01/01/2024 0500   EOSABS 0.0 01/01/2024 0500   BASOSABS 0.0 01/01/2024 0500    BMET    Component Value Date/Time   NA 138 02/14/2024 1000   NA 140 02/11/2024 1013   K 3.8 02/14/2024 1000   CL 104 02/14/2024 1000   CO2 22 02/14/2024 1000   GLUCOSE 122 (H) 02/14/2024 1000   BUN 12 02/14/2024 1000   BUN 16 02/11/2024 1013   CREATININE 0.99 02/14/2024 1000   CALCIUM  8.7 (L) 02/14/2024 1000   GFRNONAA >60 02/14/2024 1000   GFRAA >60 02/16/2019 0319    INR    Component Value Date/Time   INR 1.1 02/14/2024 1000     Intake/Output Summary (Last 24 hours) at 02/15/2024 0837 Last data filed at 02/15/2024 0133 Gross per 24 hour  Intake 1190 ml  Output 475 ml  Net 715 ml     Assessment/Plan:  78 y.o. male is s/p TEVAR 1 Day Post-Op   Subjectively feeling well this morning.  BLE well perfused with palpable DP pulses.  R groin incision is well appearing without hematoma.  Foley has been removed and he is able to void.  R a line site unremarkable with palpable pulse distal.  CBC and BMP pending this morning.  Ambulate with mobility team.  Home later this morning if ambulating well and labs are ok.  Office will arrange CTA  c/a/p in 1 month   Donnice Sender, [place] [facility] [phone] 02/15/2024 8:37 AM  VASCULAR STAFF ADDENDUM: I have independently interviewed and examined the patient. I agree with the above.  Doing well this morning. No pain. Groin soft, palpable pulses. Home today.  [name] [facility] Phone Number: [phone] 02/15/2024 8:46 AM

## 2024-02-15 NOTE — Progress Notes (Signed)
 RN went over discharge instructions with patient and patient's wife at bedside. RN went over medication changes and follow up appointments. Patient has no questions. Patient educated on post procedural care. PIV removed. Tele monitor removed and CCMD notified.

## 2024-02-15 NOTE — TOC Transition Note (Signed)
 Transition of Care (TOC) - Discharge Note Rayfield Gobble RN, BSN Inpatient Care Management Unit 4E- RN Case Manager See Treatment Team for direct phone #   Patient Details  Name: Nicholas Burke MRN: [id number] Date of Birth: 02/28/1946  Transition of [facility]) CM/SW Contact:  Gobble Rayfield Hurst, RN Phone Number: 02/15/2024, 11:37 AM   Clinical Narrative:    Pt stable for transition home today, CM notified by [facility] liaison that [facility] office made [facility]- liaison to follow up with pt.   No DME needs noted, family to transport home.      Final next level of care: Home w Home Health Services Barriers to Discharge: No Barriers Identified   Patient Goals and CMS Choice Patient states their goals for this hospitalization and ongoing recovery are:: return home          Discharge Placement               Home w/ [facility]        Discharge Plan and Services Additional resources added to the After Visit Summary for   In-house Referral: NA Discharge Planning Services: CM Consult            DME Arranged: N/A DME Agency: NA         HH Agency: [facility] Date HH Agency Contacted: 02/15/24 Time HH Agency Contacted: 1137 Representative spoke with at [facility] Agency: [place]  Social Drivers of Health (SDOH) Interventions SDOH Screenings   Food Insecurity: No Food Insecurity (02/15/2024)  Housing: Low Risk  (02/15/2024)  Transportation Needs: No Transportation Needs (02/15/2024)  Utilities: Not At Risk (02/15/2024)  Social Connections: Unknown (02/15/2024)  Tobacco Use: Low Risk  (02/14/2024)     Readmission Risk Interventions    02/15/2024   11:37 AM  Readmission Risk Prevention Plan  Transportation Screening Complete  Home Care Screening Complete  Medication Review (RN CM) Complete

## 2024-02-15 NOTE — Discharge Instructions (Signed)
   [facility]   Discharge Instructions  Endovascular Aortic Aneurysm Repair  Please refer to the following instructions for your post-procedure care. Your surgeon or Physician Assistant will discuss any changes with you.  Activity  You are encouraged to walk as much as you can. You can slowly return to normal activities but must avoid strenuous activity and heavy lifting until your doctor tells you it's OK. Avoid activities such as vacuuming or swinging a gold club. It is normal to feel tired for several weeks after your surgery. Do not drive until your doctor gives the OK and you are no longer taking prescription pain medications. It is also normal to have difficulty with sleep habits, eating, and bowel movements after surgery. These will go away with time.  Bathing/Showering  You may shower after you go home. If you have an incision, do not soak in a bathtub, hot tub, or swim until the incision heals completely.  Incision Care  Shower every day. Clean your incision with mild soap and water. Pat the area dry with a clean towel. You do not need a bandage unless otherwise instructed. Do not apply any ointments or creams to your incision. If you clothing is irritating, you may cover your incision with a dry gauze pad.  Diet  Resume your normal diet. There are no special food restrictions following this procedure. A low fat/low cholesterol diet is recommended for all patients with vascular disease. In order to heal from your surgery, it is CRITICAL to get adequate nutrition. Your body requires vitamins, minerals, and protein. Vegetables are the best source of vitamins and minerals. Vegetables also provide the perfect balance of protein. Processed food has little nutritional value, so try to avoid this.  Medications  Resume taking all of your medications unless your doctor or nurse practitioner tells you not to. If your incision is causing pain, you may take  over-the-counter pain relievers such as acetaminophen (Tylenol). If you were prescribed a stronger pain medication, please be aware these medications can cause nausea and constipation. Prevent nausea by taking the medication with a snack or meal. Avoid constipation by drinking plenty of fluids and eating foods with a high amount of fiber, such as fruits, vegetables, and grains. Do not take Tylenol if you are taking prescription pain medications.   Follow up  [facility] office will schedule a follow-up appointment with a C.T. scan 3-4 weeks after your surgery.  Please call us immediately for any of the following conditions  Severe or worsening pain in your legs or feet or in your abdomen back or chest. Increased pain, redness, drainage (pus) from your incision sit. Increased abdominal pain, bloating, nausea, vomiting or persistent diarrhea. Fever of 101 degrees or higher. Swelling in your leg (s),  Reduce your risk of vascular disease  Stop smoking. If you would like help call [facility] at 1-800-QUIT-NOW 819-462-0294) or [place] at [phone]. Manage your cholesterol Maintain a desired weight Control your diabetes Keep your blood pressure down  If you have questions, please call the office at [phone].

## 2024-02-16 NOTE — Discharge Summary (Signed)
 TEVAR Discharge Summary   Nicholas Burke 22-Nov-1945 78 y.o. male  MRN: [id number]  Admission Date: 02/14/2024  Discharge Date: 02/15/24  Physician: Dr. Lanis  Admission Diagnosis: Aneurysm of the descending thoracic aorta, without rupture [I71.23] S/P insertion of endovascular thoracic aortic stent graft [S04.171] Thoracic aortic aneurysm [I71.20]  Discharge Day services:    See progress note 02/15/24  Hospital Course:  Mr. Nicholas Burke is a 78 year old male who was brought in as an outpatient and underwent endovascular repair of descending thoracic aortic intramural hematoma with aneurysmal degeneration by Dr. Lanis on 02/14/2024.  He tolerated the procedure well and was admitted to the hospital postoperatively.  POD #1 he maintained palpable pulses in bilateral lower extremities.  The right groin incision was well-appearing without hematoma.  He was ambulating well and ready for discharge home.  His Macrobid  was continued due to chronic UTI and he will follow-up with his PCP regarding this.  He was able to void on his own after Foley removal.  He was prescribed #10 5/325 mg Percocet for postoperative pain control.  He will follow-up in the office with a CTA chest abdomen pelvis to see Dr. Lanis in 1 month.  He was discharged home in stable condition.  CBC    Component Value Date/Time   WBC 16.2 (H) 02/15/2024 0901   RBC 4.13 (L) 02/15/2024 0901   HGB 9.9 (L) 02/15/2024 0901   HCT 32.3 (L) 02/15/2024 0901   PLT 473 (H) 02/15/2024 0901   MCV 78.2 (L) 02/15/2024 0901   MCH 24.0 (L) 02/15/2024 0901   MCHC 30.7 02/15/2024 0901   RDW 18.2 (H) 02/15/2024 0901   LYMPHSABS 1.0 01/01/2024 0500   MONOABS 0.7 01/01/2024 0500   EOSABS 0.0 01/01/2024 0500   BASOSABS 0.0 01/01/2024 0500    BMET    Component Value Date/Time   NA 136 02/15/2024 0901   NA 140 02/11/2024 1013   K 4.2 02/15/2024 0901   CL 103 02/15/2024 0901   CO2 22 02/15/2024 0901   GLUCOSE 149 (H) 02/15/2024  0901   BUN 23 02/15/2024 0901   BUN 16 02/11/2024 1013   CREATININE 1.29 (H) 02/15/2024 0901   CALCIUM  9.1 02/15/2024 0901   GFRNONAA 57 (L) 02/15/2024 0901   GFRAA >60 02/16/2019 0319         Discharge Diagnosis:  Aneurysm of the descending thoracic aorta, without rupture [I71.23] S/P insertion of endovascular thoracic aortic stent graft [Z95.828] Thoracic aortic aneurysm [I71.20]  Secondary Diagnosis: Patient Active Problem List   Diagnosis Date Noted   S/P insertion of endovascular thoracic aortic stent graft 02/14/2024   Thoracic aortic aneurysm 02/14/2024   Malnutrition of moderate degree 12/14/2023   Intramural aortic hematoma (HCC) 12/06/2023   Essential hypertension 12/06/2023   Disorder associated with type 2 diabetes mellitus (HCC) 12/06/2023   Glaucoma 12/06/2023   Type 2 diabetes with complication (HCC) 12/06/2023   Mixed hyperlipidemia 12/06/2023   History of malignant neoplasm of prostate 12/06/2023   Aortic dissection (HCC) 12/06/2023   Special screening for malignant neoplasms, colon 10/18/2014   Hx of radiation therapy    Prostate cancer (HCC) 11/03/2012   Past Medical History:  Diagnosis Date   AAA (abdominal aortic aneurysm)    Hx of radiation therapy 01/02/13- 02/24/13   prostate 7800 cGy/40 sessions, seminal vesicles 5600 cGy/40 sessions   Hypertension    Pre-diabetes    Prostate cancer (HCC) 08/30/2012   gleason 6, vol 65.7 cc     Allergies as of  02/15/2024   No Known Allergies      Medication List     TAKE these medications    albuterol  108 (90 Base) MCG/ACT inhaler Commonly known as: VENTOLIN  HFA Inhale 2 puffs into the lungs every 4 (four) hours as needed for wheezing or shortness of breath.   amLODipine  10 MG tablet Commonly known as: NORVASC  Take 1 tablet (10 mg total) by mouth daily.   aspirin EC 81 MG tablet Take 1 tablet (81 mg total) by mouth daily at 6 (six) AM. Swallow whole.   carvedilol  25 MG tablet Commonly known  as: COREG  Take 1 tablet (25 mg total) by mouth 2 (two) times daily with a meal.   furosemide  20 MG tablet Commonly known as: LASIX  Take 2 tablets (40 mg total) by mouth daily for 5 days.   Gerhardt's butt cream Crea Apply 1 Application topically daily as needed for irritation.   hydrALAZINE  50 MG tablet Commonly known as: APRESOLINE  Take 1 tablet (50 mg total) by mouth 3 (three) times daily.   lisinopril  20 MG tablet Commonly known as: ZESTRIL  Take 1 tablet (20 mg total) by mouth daily.   multivitamin with minerals tablet Take 1 tablet by mouth daily. Men 50+   nitrofurantoin  (macrocrystal-monohydrate) 100 MG capsule Commonly known as: MACROBID  Take 100 mg by mouth every 12 (twelve) hours.   oxyCODONE -acetaminophen  5-325 MG tablet Commonly known as: PERCOCET/ROXICET Take 1 tablet by mouth every 6 (six) hours as needed for moderate pain (pain score 4-6).   pantoprazole  40 MG tablet Commonly known as: PROTONIX  Take 1 tablet (40 mg total) by mouth 2 (two) times daily before a meal.   polyethylene glycol powder 17 GM/SCOOP powder Commonly known as: GLYCOLAX /MIRALAX  Dissolve 1 capful (17g) in 4-8 ounces of liquid and take by mouth daily.   rosuvastatin  5 MG tablet Commonly known as: CRESTOR  Take 5 mg by mouth 3 (three) times a week.        Discharge Instructions:   [facility]  Discharge Instructions Endovascular Aortic Aneurysm Repair  Please refer to the following instructions for your post-procedure care. Your surgeon or Physician Assistant will discuss any changes with you.  Activity  You are encouraged to walk as much as you can. You can slowly return to normal activities but must avoid strenuous activity and heavy lifting until your doctor tells you it's OK. Avoid activities such as vacuuming or swinging a gold club. It is normal to feel tired for several weeks after your surgery. Do not drive until your doctor gives the OK and  you are no longer taking prescription pain medications. It is also normal to have difficulty with sleep habits, eating, and bowel movements after surgery. These will go away with time.  Bathing/Showering  You may shower after you go home. If you have an incision, do not soak in a bathtub, hot tub, or swim until the incision heals completely.  Incision Care  Shower every day. Clean your incision with mild soap and water . Pat the area dry with a clean towel. You do not need a bandage unless otherwise instructed. Do not apply any ointments or creams to your incision. If you clothing is irritating, you may cover your incision with a dry gauze pad.  Diet  Resume your normal diet. There are no special food restrictions following this procedure. A low fat/low cholesterol diet is recommended for all patients with vascular disease. In order to heal from your surgery, it is CRITICAL to  get adequate nutrition. Your body requires vitamins, minerals, and protein. Vegetables are the best source of vitamins and minerals. Vegetables also provide the perfect balance of protein. Processed food has little nutritional value, so try to avoid this.  Medications  Resume taking all of your medications unless your doctor or Physician Assistnat tells you not to. If your incision is causing pain, you may take over-the-counter pain relievers such as acetaminophen  (Tylenol ). If you were prescribed a stronger pain medication, please be aware these medications can cause nausea and constipation. Prevent nausea by taking the medication with a snack or meal. Avoid constipation by drinking plenty of fluids and eating foods with a high amount of fiber, such as fruits, vegetables, and grains. Do not take Tylenol  if you are taking prescription pain medications.   Follow up  Our office will schedule a follow-up appointment with a C.T. scan 3-4 weeks after your surgery.  Please call us  immediately for any of the following  conditions  Severe or worsening pain in your legs or feet or in your abdomen back or chest. Increased pain, redness, drainage (pus) from your incision sit. Increased abdominal pain, bloating, nausea, vomiting or persistent diarrhea. Fever of 101 degrees or higher. Swelling in your leg (s),  Reduce your risk of vascular disease  Stop smoking. If you would like help call [facility] at 1-800-QUIT-NOW ((812) 800-4468) or [place] at [phone]. Manage your cholesterol Maintain a desired weight Control your diabetes Keep your blood pressure down  If you have questions, please call the office at [phone].    Disposition: home  Patient's condition: is Good  Follow up: 1. Dr. Lanis in 4 weeks with CTA protocol   Donnice Sender, PA-C [facility] [phone] 02/16/2024  7:47 AM   - For VQI Registry use - Post-op:  Time to Extubation: [x]  In OR, [ ]  < 12 hrs, [ ]  12-24 hrs, [ ]  >=24 hrs Vasopressors Req. Post-op: No MI: No., [ ]  Troponin only, [ ]  EKG or Clinical New Arrhythmia: No CHF: No ICU Stay: 0 day in ICU Transfusion: No      Complications: Resp failure: No., [ ]  Pneumonia, [ ]  Ventilator Chg in renal function: No., [ ]  Inc. Cr > 0.5, [ ]  Temp. Dialysis,  [ ]  Permanent dialysis Leg ischemia: No., no Surgery needed, [ ]  Yes, Surgery needed,  [ ]  Amputation Bowel ischemia: No., [ ]  Medical Rx, [ ]  Surgical Rx Wound complication: No., [ ]  Superficial separation/infection, [ ]  Return to OR Return to OR: No  Return to OR for bleeding: No Stroke: No., [ ]  Minor, [ ]  Major  Discharge medications: Statin use:  Yes  ASA use:  Yes  Plavix use:  No  Beta blocker use:  Yes  ARB use:  No ACEI use:  Yes CCB use:  Yes

## 2024-02-23 ENCOUNTER — Other Ambulatory Visit: Payer: Self-pay

## 2024-02-23 DIAGNOSIS — I7123 Aneurysm of the descending thoracic aorta, without rupture: Secondary | ICD-10-CM

## 2024-02-27 ENCOUNTER — Other Ambulatory Visit (HOSPITAL_COMMUNITY): Payer: Self-pay

## 2024-02-27 ENCOUNTER — Other Ambulatory Visit: Payer: Self-pay

## 2024-02-28 ENCOUNTER — Other Ambulatory Visit: Payer: Self-pay

## 2024-02-28 ENCOUNTER — Other Ambulatory Visit (HOSPITAL_COMMUNITY): Payer: Self-pay

## 2024-02-28 DIAGNOSIS — I10 Essential (primary) hypertension: Secondary | ICD-10-CM

## 2024-02-28 MED ORDER — CARVEDILOL 25 MG PO TABS
25.0000 mg | ORAL_TABLET | Freq: Two times a day (BID) | ORAL | 0 refills | Status: DC
  Filled 2024-02-28: qty 60, 30d supply, fill #0

## 2024-02-29 ENCOUNTER — Other Ambulatory Visit: Payer: Self-pay

## 2024-02-29 ENCOUNTER — Other Ambulatory Visit (HOSPITAL_COMMUNITY): Payer: Self-pay

## 2024-02-29 MED ORDER — LISINOPRIL 20 MG PO TABS
20.0000 mg | ORAL_TABLET | Freq: Every day | ORAL | 1 refills | Status: DC
Start: 2024-02-29 — End: 2024-03-20
  Filled 2024-02-29: qty 90, 90d supply, fill #0

## 2024-02-29 MED ORDER — CARVEDILOL 25 MG PO TABS
25.0000 mg | ORAL_TABLET | Freq: Two times a day (BID) | ORAL | 1 refills | Status: DC
  Filled 2024-02-29: qty 180, 90d supply, fill #0

## 2024-02-29 NOTE — Progress Notes (Unsigned)
       Cardiology Office Note Date:  03/03/2024  ID:  Nicholas Burke, DOB 10-11-1945, MRN [id number] PCP:  Auston Opal, DO  Cardiologist: Joelle VEAR Ren Donley, MD  No chief complaint on file.    Problems Pre-op for type B aortic dissection w/ descending thoracic intramural hematoma needing TEVAR TTE 8/25 55-60% CTA w/ mod LAD calcification Uncontrolled HTN HLD on RN5 3x/week DM: HA1C 7.1 8/25, LDL 62 10/25 M: AE10, CL25, HE50, LL20  Visits 10/25: Hydral to 50 TID, started amlo 10, LP, BMP, Mag, BNP, chest x-ray, renin-aldo --> 5 days of lasix  40 mg given dyspnea and orthopnea and elevated BNP 10/25: TEVAR     History of Present Illness: Nicholas Burke is a 78 y.o. male who presents for follow up.   He has been doing well since last visit.  He continues to report dyspnea that has been persistent since leaving the hospital.  He took Lasix  for 5 days but does not does not feel like it helped his symptoms.  He denies any associated chest pain.  He underwent TEVAR last month and the procedure went well.  He reports his blood pressure has improved significantly with systolic in the 120s.  ROS: Otherwise negative  Physical Exam VS:  BP 100/60   Pulse 88   Ht 5' 10 (1.778 m)   Wt 183 lb (83 kg)   BMI 26.26 kg/m  , BMI Body mass index is 26.26 kg/m. GEN: Well nourished, well developed, in no acute distress HEENT: normal Neck: no JVD, carotid bruits, or masses Cardiac: RRR; no murmurs, rubs, or gallops,no edema  Respiratory:  CTAB bilaterally, normal work of breathing GI: soft, nontender, nondistended, + BS Extremities: No LE edema Skin: warm and dry, no rash Neuro:  Strength and sensation are intact  Recent Labs: Reviewed  ASSESSMENT AND PLAN Nicholas Burke is a 78 y.o. male who presents for follow up.  - Given his dyspnea and cardiac risk factors, we will proceed with ETT Myoview.  He has an appointment with pulmonology for further evaluation this month as well. - Regarding his  blood pressure, it appears to be much better controlled now.  I would like to take him off the hydralazine  given the 3 times a day dosing.  Will stop the hydralazine  and start chlorthalidone 25 mg daily.  Will obtain BMP and magnesium  in about a week. - We will also repeat A1c today.  If still above 7, will refer to pharmacy for Jardiance and GLP-1 initiation. - Follow-up with me in 2 months.   Signed, Joelle VEAR Ren Donley, MD  03/03/2024 10:56 AM    [facility]

## 2024-03-03 ENCOUNTER — Ambulatory Visit

## 2024-03-03 ENCOUNTER — Other Ambulatory Visit (HOSPITAL_COMMUNITY): Payer: Self-pay

## 2024-03-03 VITALS — BP 100/60 | HR 88 | Ht 70.0 in | Wt 183.0 lb

## 2024-03-03 DIAGNOSIS — E118 Type 2 diabetes mellitus with unspecified complications: Secondary | ICD-10-CM

## 2024-03-03 DIAGNOSIS — I10 Essential (primary) hypertension: Secondary | ICD-10-CM | POA: Diagnosis not present

## 2024-03-03 DIAGNOSIS — R0602 Shortness of breath: Secondary | ICD-10-CM

## 2024-03-03 DIAGNOSIS — E785 Hyperlipidemia, unspecified: Secondary | ICD-10-CM | POA: Diagnosis not present

## 2024-03-03 DIAGNOSIS — I7123 Aneurysm of the descending thoracic aorta, without rupture: Secondary | ICD-10-CM | POA: Diagnosis not present

## 2024-03-03 LAB — HEMOGLOBIN A1C
Est. average glucose Bld gHb Est-mCnc: 134 mg/dL
Hgb A1c MFr Bld: 6.3 % — ABNORMAL HIGH (ref 4.8–5.6)

## 2024-03-03 MED ORDER — CHLORTHALIDONE 25 MG PO TABS
25.0000 mg | ORAL_TABLET | Freq: Every day | ORAL | 3 refills | Status: DC
  Filled 2024-03-03: qty 90, 90d supply, fill #0

## 2024-03-03 NOTE — Patient Instructions (Signed)
 Medication Instructions:  Stop Hydralazine   Start Chlorthalidone 25 mg take one tablet daily *If you need a refill on your cardiac medications before your next appointment, please call your pharmacy*  Lab Work: Today- A1C  1 week- BMP, Magnesium  If you have labs (blood work) drawn today and your tests are completely normal, you will receive your results only by: MyChart Message (if you have MyChart) OR A paper copy in the mail If you have any lab test that is abnormal or we need to change your treatment, we will call you to review the results.  Testing/Procedures:   You are scheduled for a Myocardial Perfusion Study. Please arrive 15 minutes prior to your appointment time for registration and insurance purposes.  The test will take approximately 3-4 hours to complete; you may bring reading material.  If someone comes with you to your appointment, they will need to remain in the main lobby due to limited space in the testing area.  If you are pregnant or breastfeeding, please notify the nuclear lab prior to your appointment.  HOW TO PREPARE FOR YOUR TEST:  DO NOT eat or drink 3 hours prior to your test, except you may have water  DO NOT consume products containing caffeine (regular or decaffeinated) 12 hours prior to your test (ex: coffee, chocolate, sodas, tea.) DO bring a list of your current medications with you.  If not listed below, you may take your medications as normal. DO NOT TAKE _________________ for 24 HOURS prior to the test.  Bring the medication to your appointment as you may be required to take it once the test is complete. DO wear comfortable clothes (no dresses or overalls) and walking shoes, tennis shoes preferred (No heels or open toe shoes are allowed) DO NOT wear cologne, perfume, aftershave, or lotions (deodorant is allowed). If these instructions are not followed, your test will have to be rescheduled.   Please report to [address] [place], [place]  [address].  If you have questions or concerns about your appointment, you can call the Nuclear Lab [phone].  If you cannot keep your appointment, please provide 24 hours notification to the Nuclear Lab, to avoid a possible $50 charge to your account.   Follow-Up: At [facility], you and your health needs are our priority.  As part of our continuing mission to provide you with exceptional heart care, our providers are all part of one team.  This team includes your primary Cardiologist (physician) and Advanced Practice Providers or APPs (Physician Assistants and Nurse Practitioners) who all work together to provide you with the care you need, when you need it.  Your next appointment:   2 month(s)  Provider:   Joelle VEAR Ren Donley, MD

## 2024-03-04 LAB — BASIC METABOLIC PANEL WITH GFR
BUN/Creatinine Ratio: 16 (ref 10–24)
BUN: 18 mg/dL (ref 8–27)
CO2: 20 mmol/L (ref 20–29)
Calcium: 9 mg/dL (ref 8.6–10.2)
Chloride: 105 mmol/L (ref 96–106)
Creatinine, Ser: 1.13 mg/dL (ref 0.76–1.27)
Glucose: 138 mg/dL — ABNORMAL HIGH (ref 70–99)
Potassium: 4.4 mmol/L (ref 3.5–5.2)
Sodium: 140 mmol/L (ref 134–144)
eGFR: 67 mL/min/1.73 (ref >59)

## 2024-03-04 LAB — MAGNESIUM: Magnesium: 2 mg/dL (ref 1.6–2.3)

## 2024-03-08 ENCOUNTER — Ambulatory Visit: Payer: Self-pay

## 2024-03-08 DIAGNOSIS — I10 Essential (primary) hypertension: Secondary | ICD-10-CM

## 2024-03-09 ENCOUNTER — Encounter: Payer: Self-pay | Admitting: Pulmonary Disease

## 2024-03-09 ENCOUNTER — Ambulatory Visit: Admitting: Pulmonary Disease

## 2024-03-09 VITALS — BP 101/65 | HR 87 | Temp 97.5°F | Ht 70.0 in | Wt 176.0 lb

## 2024-03-09 DIAGNOSIS — R5383 Other fatigue: Secondary | ICD-10-CM

## 2024-03-09 DIAGNOSIS — Z8701 Personal history of pneumonia (recurrent): Secondary | ICD-10-CM | POA: Diagnosis not present

## 2024-03-09 DIAGNOSIS — Z8709 Personal history of other diseases of the respiratory system: Secondary | ICD-10-CM

## 2024-03-09 DIAGNOSIS — R0600 Dyspnea, unspecified: Secondary | ICD-10-CM

## 2024-03-09 DIAGNOSIS — Z8679 Personal history of other diseases of the circulatory system: Secondary | ICD-10-CM

## 2024-03-09 NOTE — Progress Notes (Signed)
 Nicholas Burke    [id number]    Apr 25, 1946  Primary Care Physician:Sparks, Milo, DO  Referring Physician: Auston Milo, DO [address] [place],  [place] [address]  Chief complaint: Consult for dyspnea, fatigue  HPI: 79 y.o. who  has a past medical history of AAA (abdominal aortic aneurysm), radiation therapy (01/02/13- 02/24/13), Hypertension, Pre-diabetes, and Prostate cancer (HCC) (08/30/2012).  Discussed the use of AI scribe software for clinical note transcription with the patient, who gave verbal consent to proceed.  History of Present Illness Nicholas Burke is a 78 year old male with a history of type B aortic dissection who presents with persistent fatigue post-hospitalization. He is accompanied by his wife.  Fatigue and exercise intolerance - Persistent fatigue since recent hospitalization, particularly during activities such as shaving or performing light chores - Able to rest without symptoms; fatigue is activity-induced - Previously active, including mowing and cutting tree limbs - Continues to use an exercise bike to maintain physical activity despite fatigue  Respiratory symptoms - Dry cough present - Occasional shortness of breath, especially when fatigued - Requires deep breaths during episodes of fatigue - No chest congestion or mucus production  Recent hospitalization and complications - Recent hospitalization for descending thoracic aorta aneurysm and dissection requiring endovascular repair.  He had a prolonged hospital stay complicated by acute respiratory failure and aspiration pneumonia, AKI, upper GI bleed due to gastric ulcers.  Relevant Pulmonary history: Pets: No pets Occupation: Retired chartered certified accountant Exposures: No mold, hot tub, Jacuzzi.  No feather pillows or comforter No h/o chemo/XRT/amiodarone/macrodantin /MTX  No exposure to asbestos, silica or other organic allergens  Smoking history: Never smoker Travel history: No significant  travel history Family history: No family history of lung disease  Outpatient Encounter Medications as of 03/09/2024  Medication Sig   albuterol  (VENTOLIN  HFA) 108 (90 Base) MCG/ACT inhaler Inhale 2 puffs into the lungs every 4 (four) hours as needed for wheezing or shortness of breath.   amLODipine  (NORVASC ) 10 MG tablet Take 1 tablet (10 mg total) by mouth daily.   aspirin EC 81 MG tablet Take 1 tablet (81 mg total) by mouth daily at 6 (six) AM. Swallow whole.   carvedilol  (COREG ) 25 MG tablet Take 1 tablet (25 mg total) by mouth 2 (two) times daily with food.   chlorthalidone (HYGROTON) 25 MG tablet Take 1 tablet (25 mg total) by mouth daily.   lisinopril  (ZESTRIL ) 20 MG tablet Take 1 tablet (20 mg total) by mouth daily.   Multiple Vitamins-Minerals (MULTIVITAMIN WITH MINERALS) tablet Take 1 tablet by mouth daily. Men 50+   Nystatin (GERHARDT'S BUTT CREAM) CREA Apply 1 Application topically daily as needed for irritation.   oxyCODONE -acetaminophen  (PERCOCET/ROXICET) 5-325 MG tablet Take 1 tablet by mouth every 6 (six) hours as needed for moderate pain (pain score 4-6).   pantoprazole  (PROTONIX ) 40 MG tablet Take 1 tablet (40 mg total) by mouth 2 (two) times daily before a meal.   polyethylene glycol powder (GLYCOLAX /MIRALAX ) 17 GM/SCOOP powder Dissolve 1 capful (17g) in 4-8 ounces of liquid and take by mouth daily.   rosuvastatin  (CRESTOR ) 5 MG tablet Take 5 mg by mouth 3 (three) times a week.   furosemide  (LASIX ) 20 MG tablet Take 2 tablets (40 mg total) by mouth daily for 5 days. (Patient not taking: Reported on 03/09/2024)   No facility-administered encounter medications on file as of 03/09/2024.     Physical Exam:  Today's Vitals   03/09/24  0951  BP: 101/65  Pulse: 87  Temp: (!) 97.5 F (36.4 C)  TempSrc: Oral  SpO2: 97%  Weight: 176 lb (79.8 kg)  Height: 5' 10 (1.778 m)   Body mass index is 25.25 kg/m.  Physical Exam GEN: No acute distress CV: Regular rate and rhythm  no murmurs LUNGS: Clear to auscultation bilaterally normal respiratory effort SKIN JOINTS: Warm and dry no rash    Data Reviewed: Imaging: CTA 01/25/2024-progressive aneurysm of proximal descending aorta with penetrating ulceration, early dissection, intramural hematoma.  Mild emphysema.  Moderate coronary artery calcification I have reviewed the images personally.  PFTs:  Labs:  Assessment and Plan Assessment & Plan Fatigue and deconditioning following hospitalization for descending aortic aneurysm and dissection status post endovascular repair Fatigue and deconditioning likely due to prolonged hospitalization and major surgical procedures, including endovascular repair of descending aortic aneurysm and dissection. No evidence of COPD or asthma. Persistent fatigue likely due to deconditioning and recovery from hospitalization. - Encouraged aerobic exercise, such as using a stationary bike, if cleared by vascular surgeon. - Ordered lung function test in the next one to two months.  History of aspiration pneumonia and persistent pulmonary inflammation Emphysema Aspiration pneumonia and persistent pulmonary inflammation likely secondary to prolonged hospitalization.  Emphysema noted on prior CTs though he is a non-smoker. Lungs sound clear on examination. Persistent inflammation may contribute to fatigue and dry cough. - Will review CT angio of chest, abdomen, and pelvis scheduled by vascular surgery to reassess lung.  PFTs have been ordered.  Recommendations: Schedule PFTs Review upcoming CT chest  I personally spent a total of 45 minutes in the care of the patient today including preparing to see the patient, getting/reviewing separately obtained history, performing a medically appropriate exam/evaluation, counseling and educating, independently interpreting results, communicating results, and coordinating care.   Lonna Coder MD [facility] 03/09/2024,  9:59 AM  CC: Auston Opal, DO

## 2024-03-09 NOTE — Patient Instructions (Signed)
  VISIT SUMMARY: Today, we discussed your persistent fatigue and respiratory symptoms following your recent hospitalization for aortic dissection and aspiration pneumonia. We reviewed your current condition and planned further tests and exercises to aid your recovery.  YOUR PLAN: FATIGUE AND DECONDITIONING: Your fatigue and reduced exercise tolerance are likely due to the prolonged hospitalization and major surgical procedures you underwent. -Continue using your stationary bike for aerobic exercise, if cleared by your vascular surgeon. -We will schedule a lung function test in the next one to two months to further assess your condition.  HISTORY OF ASPIRATION PNEUMONIA AND PERSISTENT PULMONARY INFLAMMATION: Your previous aspiration pneumonia and ongoing lung inflammation may be contributing to your fatigue and dry cough. -We will review the CT angiogram of your chest, abdomen, and pelvis scheduled by vascular team and schedule pulmonary function test

## 2024-03-11 LAB — BASIC METABOLIC PANEL WITH GFR
BUN/Creatinine Ratio: 16 (ref 10–24)
BUN: 23 mg/dL (ref 8–27)
CO2: 22 mmol/L (ref 20–29)
Calcium: 9.5 mg/dL (ref 8.6–10.2)
Chloride: 102 mmol/L (ref 96–106)
Creatinine, Ser: 1.42 mg/dL — ABNORMAL HIGH (ref 0.76–1.27)
Glucose: 135 mg/dL — ABNORMAL HIGH (ref 70–99)
Potassium: 3.9 mmol/L (ref 3.5–5.2)
Sodium: 142 mmol/L (ref 134–144)
eGFR: 51 mL/min/1.73 — ABNORMAL LOW (ref >59)

## 2024-03-11 LAB — MAGNESIUM: Magnesium: 1.9 mg/dL (ref 1.6–2.3)

## 2024-03-13 NOTE — Telephone Encounter (Signed)
 I called the patient to discuss his lab results.  He seems like he has an AKI.  He denies any nausea, vomiting or diarrhea.  I told him to stop taking the chlorthalidone and restart the hydralazine  50 mg 3 times daily.  Will repeat the BMP and magnesium  in about 1-2 weeks.  Joelle DEL. Ren Ny, MD [facility]

## 2024-03-17 ENCOUNTER — Other Ambulatory Visit: Payer: Self-pay

## 2024-03-17 ENCOUNTER — Emergency Department (HOSPITAL_COMMUNITY)

## 2024-03-17 ENCOUNTER — Encounter (HOSPITAL_COMMUNITY)

## 2024-03-17 ENCOUNTER — Inpatient Hospital Stay (HOSPITAL_COMMUNITY)
Admission: EM | Admit: 2024-03-17 | Discharge: 2024-03-20 | DRG: 315 | Disposition: A | Discharge status: Home / Self Care | Attending: Internal Medicine | Admitting: Internal Medicine

## 2024-03-17 DIAGNOSIS — Z923 Personal history of irradiation: Secondary | ICD-10-CM

## 2024-03-17 DIAGNOSIS — D649 Anemia, unspecified: Secondary | ICD-10-CM | POA: Insufficient documentation

## 2024-03-17 DIAGNOSIS — Z79899 Other long term (current) drug therapy: Secondary | ICD-10-CM

## 2024-03-17 DIAGNOSIS — Z8042 Family history of malignant neoplasm of prostate: Secondary | ICD-10-CM

## 2024-03-17 DIAGNOSIS — D62 Acute posthemorrhagic anemia: Secondary | ICD-10-CM | POA: Diagnosis present

## 2024-03-17 DIAGNOSIS — Z6825 Body mass index (BMI) 25.0-25.9, adult: Secondary | ICD-10-CM

## 2024-03-17 DIAGNOSIS — I7143 Infrarenal abdominal aortic aneurysm, without rupture: Secondary | ICD-10-CM | POA: Diagnosis present

## 2024-03-17 DIAGNOSIS — Z9842 Cataract extraction status, left eye: Secondary | ICD-10-CM

## 2024-03-17 DIAGNOSIS — I959 Hypotension, unspecified: Secondary | ICD-10-CM | POA: Diagnosis present

## 2024-03-17 DIAGNOSIS — T82310A Breakdown (mechanical) of aortic (bifurcation) graft (replacement), initial encounter: Principal | ICD-10-CM | POA: Diagnosis present

## 2024-03-17 DIAGNOSIS — E1122 Type 2 diabetes mellitus with diabetic chronic kidney disease: Secondary | ICD-10-CM | POA: Diagnosis present

## 2024-03-17 DIAGNOSIS — I700 Atherosclerosis of aorta: Secondary | ICD-10-CM | POA: Diagnosis present

## 2024-03-17 DIAGNOSIS — Z8546 Personal history of malignant neoplasm of prostate: Secondary | ICD-10-CM

## 2024-03-17 DIAGNOSIS — R310 Gross hematuria: Principal | ICD-10-CM | POA: Diagnosis present

## 2024-03-17 DIAGNOSIS — Z9841 Cataract extraction status, right eye: Secondary | ICD-10-CM

## 2024-03-17 DIAGNOSIS — R630 Anorexia: Secondary | ICD-10-CM | POA: Diagnosis present

## 2024-03-17 DIAGNOSIS — D494 Neoplasm of unspecified behavior of bladder: Secondary | ICD-10-CM | POA: Diagnosis present

## 2024-03-17 DIAGNOSIS — N179 Acute kidney failure, unspecified: Secondary | ICD-10-CM | POA: Diagnosis present

## 2024-03-17 DIAGNOSIS — C61 Malignant neoplasm of prostate: Secondary | ICD-10-CM | POA: Diagnosis present

## 2024-03-17 DIAGNOSIS — N184 Chronic kidney disease, stage 4 (severe): Secondary | ICD-10-CM | POA: Diagnosis present

## 2024-03-17 DIAGNOSIS — Z7982 Long term (current) use of aspirin: Secondary | ICD-10-CM

## 2024-03-17 DIAGNOSIS — I129 Hypertensive chronic kidney disease with stage 1 through stage 4 chronic kidney disease, or unspecified chronic kidney disease: Secondary | ICD-10-CM | POA: Diagnosis present

## 2024-03-17 DIAGNOSIS — R32 Unspecified urinary incontinence: Secondary | ICD-10-CM | POA: Diagnosis present

## 2024-03-17 DIAGNOSIS — Y712 Prosthetic and other implants, materials and accessory cardiovascular devices associated with adverse incidents: Secondary | ICD-10-CM | POA: Diagnosis present

## 2024-03-17 DIAGNOSIS — R634 Abnormal weight loss: Secondary | ICD-10-CM | POA: Diagnosis present

## 2024-03-17 DIAGNOSIS — K8020 Calculus of gallbladder without cholecystitis without obstruction: Secondary | ICD-10-CM | POA: Diagnosis present

## 2024-03-17 LAB — BASIC METABOLIC PANEL WITH GFR
Anion gap: 12 (ref 5–15)
BUN: 32 mg/dL — ABNORMAL HIGH (ref 8–23)
CO2: 25 mmol/L (ref 22–32)
Calcium: 9.5 mg/dL (ref 8.9–10.3)
Chloride: 99 mmol/L (ref 98–111)
Creatinine, Ser: 2.06 mg/dL — ABNORMAL HIGH (ref 0.61–1.24)
GFR, Estimated: 33 mL/min — ABNORMAL LOW (ref >60)
Glucose, Bld: 145 mg/dL — ABNORMAL HIGH (ref 70–99)
Potassium: 3.7 mmol/L (ref 3.5–5.1)
Sodium: 136 mmol/L (ref 135–145)

## 2024-03-17 LAB — HEMOGLOBIN AND HEMATOCRIT, BLOOD
HCT: 25.2 % — ABNORMAL LOW (ref 39.0–52.0)
Hemoglobin: 7.5 g/dL — ABNORMAL LOW (ref 13.0–17.0)

## 2024-03-17 LAB — CBC
HCT: 25.6 % — ABNORMAL LOW (ref 39.0–52.0)
Hemoglobin: 7.5 g/dL — ABNORMAL LOW (ref 13.0–17.0)
MCH: 21.9 pg — ABNORMAL LOW (ref 26.0–34.0)
MCHC: 29.3 g/dL — ABNORMAL LOW (ref 30.0–36.0)
MCV: 74.9 fL — ABNORMAL LOW (ref 80.0–100.0)
Platelets: 440 K/uL — ABNORMAL HIGH (ref 150–400)
RBC: 3.42 MIL/uL — ABNORMAL LOW (ref 4.22–5.81)
RDW: 19.2 % — ABNORMAL HIGH (ref 11.5–15.5)
WBC: 10.7 K/uL — ABNORMAL HIGH (ref 4.0–10.5)
nRBC: 0 % (ref 0.0–0.2)

## 2024-03-17 LAB — RETICULOCYTES
Immature Retic Fract: 23.5 % — ABNORMAL HIGH (ref 2.3–15.9)
RBC.: 3.17 MIL/uL — ABNORMAL LOW (ref 4.22–5.81)
Retic Count, Absolute: 28.5 K/uL (ref 19.0–186.0)
Retic Ct Pct: 0.9 % (ref 0.4–3.1)

## 2024-03-17 LAB — URINALYSIS, W/ REFLEX TO CULTURE (INFECTION SUSPECTED): Bacteria, UA: NONE SEEN

## 2024-03-17 LAB — IRON AND TIBC
Iron: 20 ug/dL — ABNORMAL LOW (ref 45–182)
Saturation Ratios: 11 % — ABNORMAL LOW (ref 17.9–39.5)
TIBC: 188 ug/dL — ABNORMAL LOW (ref 250–450)
UIBC: 168 ug/dL

## 2024-03-17 LAB — FERRITIN: Ferritin: 573 ng/mL — ABNORMAL HIGH (ref 24–336)

## 2024-03-17 MED ORDER — ACETAMINOPHEN 325 MG PO TABS
650.0000 mg | ORAL_TABLET | Freq: Four times a day (QID) | ORAL | Status: DC | PRN
  Administered 2024-03-18 (×2): 650 mg via ORAL
  Filled 2024-03-17 (×2): qty 2

## 2024-03-17 MED ORDER — HYDRALAZINE HCL 20 MG/ML IJ SOLN: 5.0000 mg | Freq: Four times a day (QID) | INTRAMUSCULAR | Status: DC | PRN

## 2024-03-17 MED ORDER — ASPIRIN 81 MG PO TBEC
81.0000 mg | DELAYED_RELEASE_TABLET | Freq: Every day | ORAL | Status: DC
  Administered 2024-03-19 – 2024-03-20 (×2): 81 mg via ORAL
  Filled 2024-03-17 (×2): qty 1

## 2024-03-17 MED ORDER — SODIUM CHLORIDE 0.9 % IR SOLN
3000.0000 mL | Status: DC
  Administered 2024-03-18 (×5): 3000 mL

## 2024-03-17 MED ORDER — POLYETHYLENE GLYCOL 3350 17 G PO PACK
17.0000 g | PACK | Freq: Every day | ORAL | Status: DC
  Administered 2024-03-19 – 2024-03-20 (×2): 17 g via ORAL
  Filled 2024-03-17 (×2): qty 1

## 2024-03-17 MED ORDER — ALBUTEROL SULFATE (2.5 MG/3ML) 0.083% IN NEBU
3.0000 mL | INHALATION_SOLUTION | RESPIRATORY_TRACT | Status: DC | PRN
Start: 2024-03-17 — End: 2024-03-20

## 2024-03-17 MED ORDER — POLYETHYLENE GLYCOL 3350 17 GM/SCOOP PO POWD: 17.0000 g | Freq: Every day | ORAL | Status: DC

## 2024-03-17 MED ORDER — CARVEDILOL 6.25 MG PO TABS
6.2500 mg | ORAL_TABLET | Freq: Two times a day (BID) | ORAL | Status: DC
  Administered 2024-03-18 – 2024-03-20 (×5): 6.25 mg via ORAL
  Filled 2024-03-17 (×5): qty 1

## 2024-03-17 MED ORDER — ONDANSETRON HCL 4 MG/2ML IJ SOLN: 4.0000 mg | Freq: Four times a day (QID) | INTRAMUSCULAR | Status: DC | PRN

## 2024-03-17 MED ORDER — IOHEXOL 350 MG/ML SOLN
80.0000 mL | Freq: Once | INTRAVENOUS | Status: AC | PRN
  Administered 2024-03-17: 80 mL via INTRAVENOUS

## 2024-03-17 MED ORDER — PANTOPRAZOLE SODIUM 40 MG PO TBEC
40.0000 mg | DELAYED_RELEASE_TABLET | Freq: Two times a day (BID) | ORAL | Status: DC
  Administered 2024-03-17 – 2024-03-20 (×5): 40 mg via ORAL
  Filled 2024-03-17 (×5): qty 1

## 2024-03-17 MED ORDER — HYDROMORPHONE HCL 1 MG/ML IJ SOLN
0.5000 mg | INTRAMUSCULAR | Status: DC | PRN
  Administered 2024-03-17 – 2024-03-18 (×3): 1 mg via INTRAVENOUS
  Filled 2024-03-17 (×3): qty 1

## 2024-03-17 MED ORDER — ACETAMINOPHEN 650 MG RE SUPP: 650.0000 mg | Freq: Four times a day (QID) | RECTAL | Status: DC | PRN

## 2024-03-17 MED ORDER — OXYCODONE-ACETAMINOPHEN 5-325 MG PO TABS
1.0000 | ORAL_TABLET | Freq: Four times a day (QID) | ORAL | Status: DC | PRN
Start: 2024-03-17 — End: 2024-03-20
  Administered 2024-03-19 (×2): 1 via ORAL
  Filled 2024-03-17 (×2): qty 1

## 2024-03-17 MED ORDER — ROSUVASTATIN CALCIUM 5 MG PO TABS: 5.0000 mg | ORAL_TABLET | ORAL | Status: DC

## 2024-03-17 MED ORDER — ONDANSETRON HCL 4 MG PO TABS: 4.0000 mg | ORAL_TABLET | Freq: Four times a day (QID) | ORAL | Status: DC | PRN

## 2024-03-17 MED ORDER — SODIUM CHLORIDE 0.9 % IV SOLN
1.0000 g | INTRAVENOUS | Status: DC
  Administered 2024-03-18 – 2024-03-19 (×2): 1 g via INTRAVENOUS
  Filled 2024-03-17 (×3): qty 10

## 2024-03-17 MED ORDER — SODIUM CHLORIDE 0.9 % IV SOLN
1.0000 g | Freq: Once | INTRAVENOUS | Status: AC
  Administered 2024-03-17: 1 g via INTRAVENOUS
  Filled 2024-03-17: qty 10

## 2024-03-17 NOTE — ED Provider Notes (Signed)
 [place] EMERGENCY DEPARTMENT AT [facility] Provider Note   CSN: [id number] Arrival date & time: 03/17/24  1130     Patient presents with: Hematuria   Artem Bunte is a 78 y.o. male.   This is a 78 year old male who is here today for blood in his urine.  Patient reports that over the last few days he has noticed that he has been having blood in his urine, has had some pain with urination.  He denies fever or chills, does endorse feeling a bit weak.  Patient had endovascular aortic graft repair in October.  States that he had been feeling okay for a few weeks after that but in the last few weeks has been feeling worse.   Hematuria       Prior to Admission medications   Medication Sig Start Date End Date Taking? Authorizing Provider  albuterol  (VENTOLIN  HFA) 108 (90 Base) MCG/ACT inhaler Inhale 2 puffs into the lungs every 4 (four) hours as needed for wheezing or shortness of breath. 01/01/24   Cheryle Page, MD  amLODipine  (NORVASC ) 10 MG tablet Take 1 tablet (10 mg total) by mouth daily. 02/02/24   Azobou Donley Joelle DEL, MD  aspirin  EC 81 MG tablet Take 1 tablet (81 mg total) by mouth daily at 6 (six) AM. Swallow whole. 02/16/24   Bethanie Cough, PA-C  carvedilol  (COREG ) 25 MG tablet Take 1 tablet (25 mg total) by mouth 2 (two) times daily with food. 02/29/24   Azobou Donley Joelle DEL, MD  hydrALAZINE  (APRESOLINE ) 50 MG tablet Take 50 mg by mouth 3 (three) times daily.    [provider]  lisinopril  (ZESTRIL ) 20 MG tablet Take 1 tablet (20 mg total) by mouth daily. 02/29/24   Azobou Donley Joelle DEL, MD  Multiple Vitamins-Minerals (MULTIVITAMIN WITH MINERALS) tablet Take 1 tablet by mouth daily. Men 50+    [provider]  Nystatin (GERHARDT'S BUTT CREAM) CREA Apply 1 Application topically daily as needed for irritation.    [provider]  oxyCODONE -acetaminophen  (PERCOCET/ROXICET) 5-325 MG tablet Take 1 tablet by mouth every 6 (six) hours as  needed for moderate pain (pain score 4-6). 02/15/24   Bethanie Cough, PA-C  pantoprazole  (PROTONIX ) 40 MG tablet Take 1 tablet (40 mg total) by mouth 2 (two) times daily before a meal. 01/01/24   Cheryle Page, MD  polyethylene glycol powder (GLYCOLAX /MIRALAX ) 17 GM/SCOOP powder Dissolve 1 capful (17g) in 4-8 ounces of liquid and take by mouth daily. 01/02/24   Cheryle Page, MD  rosuvastatin  (CRESTOR ) 5 MG tablet Take 5 mg by mouth 3 (three) times a week. 11/21/20   [provider]    Allergies: Reglan  [metoclopramide ]    Review of Systems  Genitourinary:  Positive for hematuria.    Updated Vital Signs BP 117/66 (BP Location: Right Arm)   Pulse 81   Temp 98.1 F (36.7 C) (Oral)   Resp 16   SpO2 99%   Physical Exam Vitals and nursing note reviewed.  Constitutional:      Appearance: He is not toxic-appearing.  HENT:     Head: Normocephalic.  Pulmonary:     Effort: Pulmonary effort is normal.  Abdominal:     General: Abdomen is flat. There is no distension.     Palpations: Abdomen is soft.     Tenderness: There is no abdominal tenderness. There is no guarding.  Musculoskeletal:        General: Normal range of motion.     Cervical back:  Normal range of motion.  Skin:    General: Skin is warm.  Neurological:     General: No focal deficit present.     Mental Status: He is alert.     (all labs ordered are listed, but only abnormal results are displayed) Labs Reviewed  BASIC METABOLIC PANEL WITH GFR - Abnormal; Notable for the following components:      Result Value   Glucose, Bld 145 (*)    BUN 32 (*)    Creatinine, Ser 2.06 (*)    GFR, Estimated 33 (*)    All other components within normal limits  CBC - Abnormal; Notable for the following components:   WBC 10.7 (*)    RBC 3.42 (*)    Hemoglobin 7.5 (*)    HCT 25.6 (*)    MCV 74.9 (*)    MCH 21.9 (*)    MCHC 29.3 (*)    RDW 19.2 (*)    Platelets 440 (*)    All other components within normal limits   URINALYSIS, W/ REFLEX TO CULTURE (INFECTION SUSPECTED) - Abnormal; Notable for the following components:   Color, Urine   (*)    Value: TEST NOT REPORTED DUE TO COLOR INTERFERENCE OF URINE PIGMENT   APPearance   (*)    Value: TEST NOT REPORTED DUE TO COLOR INTERFERENCE OF URINE PIGMENT   Glucose, UA   (*)    Value: TEST NOT REPORTED DUE TO COLOR INTERFERENCE OF URINE PIGMENT   Hgb urine dipstick   (*)    Value: TEST NOT REPORTED DUE TO COLOR INTERFERENCE OF URINE PIGMENT   Bilirubin Urine   (*)    Value: TEST NOT REPORTED DUE TO COLOR INTERFERENCE OF URINE PIGMENT   Ketones, ur   (*)    Value: TEST NOT REPORTED DUE TO COLOR INTERFERENCE OF URINE PIGMENT   Protein, ur   (*)    Value: TEST NOT REPORTED DUE TO COLOR INTERFERENCE OF URINE PIGMENT   Nitrite   (*)    Value: TEST NOT REPORTED DUE TO COLOR INTERFERENCE OF URINE PIGMENT   Leukocytes,Ua   (*)    Value: TEST NOT REPORTED DUE TO COLOR INTERFERENCE OF URINE PIGMENT   All other components within normal limits  HEMOGLOBIN AND HEMATOCRIT, BLOOD - Abnormal; Notable for the following components:   Hemoglobin 7.5 (*)    HCT 25.2 (*)    All other components within normal limits    EKG: None  Radiology: CT Angio Chest/Abd/Pel for Dissection W and/or W/WO Result Date: 03/17/2024 CLINICAL DATA:  Acute aortic syndrome suspected. Blood in urine. Dysuria. EXAM: CT ANGIOGRAPHY CHEST, ABDOMEN AND PELVIS TECHNIQUE: Noncontrast chest CT was performed. Multidetector CT imaging through the chest, abdomen and pelvis was performed using the standard protocol during bolus administration of intravenous contrast. Multiplanar reconstructed images and MIPs were obtained and reviewed to evaluate the vascular anatomy. RADIATION DOSE REDUCTION: This exam was performed according to the departmental dose-optimization program which includes automated exposure control, adjustment of the mA and/or kV according to patient size and/or use of iterative  reconstruction technique. CONTRAST:  80mL OMNIPAQUE  IOHEXOL  350 MG/ML SOLN COMPARISON:  CT chest abdomen and pelvis 12/20/2023 FINDINGS: CTA CHEST FINDINGS Cardiovascular: There is a new descending thoracic aortic graft in place which appears widely patent. Intramural hematoma throughout the descending thoracic aorta again seen. The descending thoracic aorta is dilated measuring 5.4 cm. There is an endoleak along the proximal margin of the endograft with contrast filling the intramural  hematoma. Origin of great vessels within normal limits. The heart is normal in size.  There is no pericardial effusion. Mediastinum/Nodes: No enlarged mediastinal, hilar, or axillary lymph nodes. Thyroid gland, trachea, and esophagus demonstrate no significant findings. There are numerous nonenlarged mediastinal and hilar lymph nodes. Lungs/Pleura: There is a mild patchy ground-glass opacities in both lower lobes, left greater than right. Left pleural effusion has resolved. There is no focal lung consolidation or pneumothorax. Musculoskeletal: Degenerative changes affect the spine. Review of the MIP images confirms the above findings. CTA ABDOMEN AND PELVIS FINDINGS VASCULAR Aorta: There is aneurysmal dilatation of the proximal abdominal aorta at the level of the diaphragm measuring 2.7 x 3.8 cm similar to the prior study. Again seen is a small amount of contrast extravasation into intramural hematoma along the posterior left aspect of the aorta, slightly increased from prior examination. Infrarenal abdominal aortic aneurysm measures 3.0 cm and is unchanged in size. Focal dissection or region leakage of contrast into the intramural hematoma has increased at this level. Celiac: Patent without evidence of aneurysm, dissection, vasculitis or significant stenosis. SMA: Patent without evidence of aneurysm, dissection, vasculitis or significant stenosis. Renals: Both renal arteries are patent without evidence of aneurysm, dissection,  vasculitis, fibromuscular dysplasia or significant stenosis. IMA: Patent without evidence of aneurysm, dissection, vasculitis or significant stenosis. Inflow: Patent without evidence of aneurysm, dissection, vasculitis or significant stenosis. Veins: No obvious venous abnormality within the limitations of this arterial phase study. Review of the MIP images confirms the above findings. NON-VASCULAR Hepatobiliary: Gallstones are present. There is no biliary ductal dilatation. The liver is within normal limits. Pancreas: Unremarkable. No pancreatic ductal dilatation or surrounding inflammatory changes. Spleen: Normal in size without focal abnormality. Adrenals/Urinary Tract: There is heterogeneous hyperdensity filling the majority of the bladder with hyperdense fluid fluid level favored as hematoma. There is mild stranding surrounding the bladder. There is a subcentimeter hypodensity in the superior pole the right kidney which is unchanged, likely a cyst. Otherwise, the kidneys and adrenal glands are within normal limits. Stomach/Bowel: Stomach is within normal limits. Appendix appears normal. No evidence of bowel wall thickening, distention, or inflammatory changes. There is sigmoid colon diverticulosis. There some borderline dilated loops of jejunum with air-fluid levels in the upper abdomen without definite transition. Lymphatic: No enlarged lymph nodes are identified. Reproductive: Prostate radiotherapy seeds are present. Other: Small left and moderate right fat containing inguinal hernias are present. There is a small fat containing umbilical hernia. There is no ascites. Musculoskeletal: No acute or significant osseous findings. Review of the MIP images confirms the above findings. IMPRESSION: Vascular 1. New descending thoracic aortic graft in place. There is an endoleak along the proximal margin of the endograft with contrast filling the intramural hematoma. 2. Stable aneurysmal dilatation of the proximal  abdominal aorta at the level of the diaphragm measuring 3.8 cm. 3. Stable infrarenal abdominal aortic aneurysm measuring 3.0 cm. Focal dissection or region of leakage of contrast into the intramural hematoma has increased at this level. Nonvascular 1. Heterogeneous hyperdensity filling the majority of the bladder with hyperdense fluid fluid level favored as hematoma. There is mild stranding surrounding the bladder. Underlying bladder mass is not excluded. 2. Mild patchy ground-glass opacities in both lower lobes, left greater than right, likely infectious/inflammatory. 3. Cholelithiasis. 4. Borderline dilated loops of jejunum with air-fluid levels in the upper abdomen without definite transition point. Findings may represent ileus. Aortic Atherosclerosis (ICD10-I70.0). Electronically Signed   By: Greig Pique M.D.   On: 03/17/2024  15:43     Procedures   Medications Ordered in the ED  cefTRIAXone  (ROCEPHIN ) 1 g in sodium chloride  0.9 % 100 mL IVPB (1 g Intravenous New Bag/Given 03/17/24 1558)  sodium chloride  irrigation 0.9 % 3,000 mL (has no administration in time range)  iohexol  (OMNIPAQUE ) 350 MG/ML injection 80 mL (80 mLs Intravenous Contrast Given 03/17/24 1445)                                    Medical Decision Making 78 year old male here today with hematuria.  Differential diagnoses include cystitis, bladder hemorrhage, postoperative complication, aortic hematoma intramural hematoma.  Plan-given the patient's recent surgery, I will obtain CTA of the patient's chest abdomen pelvis to assess graft.  Patient's renal function has worsened since prior, hemoglobin at 7.5 this was the lowest that it has been for the patient.  Maybe all do to blood that he is losing in the urine as the patient was able to show me a urine sample that was quite bloody.  He has good pulses in his extremities, overall nontoxic-appearing.  Reassessment 410pm-patient CTA showing no endoleak.  Did discuss this with the  on-call vascular surgeon Dr. Silver, who was the patient's vascular surgeon.  He reviewed the imaging, did not believe this was related to his hematuria, and stated this would be something they would follow-up on at a later date, no emergent process at this time.  Patient CT imaging also shows bladder hematoma.  Have reached out to urology.  Reassessment 4:25 PM-spoke with Dr. Sherrilee from urology who agreed with plan for CBI.  Will admit patient to hospitalist.  Patient's repeat H&H stable.  Amount and/or Complexity of Data Reviewed Labs: ordered. Radiology: ordered.  Risk Prescription drug management. Decision regarding hospitalization.       Final diagnoses:  Gross hematuria    ED Discharge Orders     None          Mannie Fairy DASEN, DO 03/17/24 1626

## 2024-03-17 NOTE — ED Notes (Signed)
 Pt provided with labeled urine cup, and prompted to provide specimen.

## 2024-03-17 NOTE — Procedures (Signed)
 @  UN[facility]@  CC:  Chief Complaint  Patient presents with   Hematuria    Cystoscopy Procedure Note  Patient identification was confirmed, informed consent was obtained, and patient was prepped using Betadine solution.  Lidocaine  jelly was administered per urethral meatus.    I initially attempted to place a 24 Fr 3-way hematuria catheter but was unable to do so due to resistance in the prostatic urethra.   Pre-Procedure: - Inspection reveals a normal caliber ureteral meatus.  Procedure: The flexible cystoscope was introduced without difficulty There was evidence of trauma and clot from prior catheterization attempts in the membranous and bulbar urethra, which made visualization difficult. There did appear to be a narrowing of the bulbar urethra, but I was able to traverse this with the 17Fr Ambu scope. Once I entered the bladder, there was a large, organized-appearing clot. I was unable to visualize much of the bladder mucosa or Uo's. I placed a sensor wire through the scope and removed the cystoscope. I then attempted to pass a 24 Fr 3-way hematuria catheter followed by a 22 Fr hematuria catheter over the wire, but was unable to do so due to resistance in the proximal urethra. I was unable to dilate the urethra as hayman bard  or balloon dilators were not available.  I was able to advance an 18 Fr 3-way catheter over the wire and into the bladder. The balloon was instilled with 10 cc water  and the wire was removed. >500 cc dark merlot urine drained immediately. I attempted hand irrigation with return of only several cc's of clot. CBI was started and urine was thin, cherry, and draining well.  Assessment/ Plan: - See same-day consult note  No follow-ups on file.  Maurilio Agar, MD

## 2024-03-17 NOTE — Progress Notes (Signed)
 Patient seen and examined at bedside  Type Ia endoleak noted from the thoracic graft.  This was placed in intramural hematoma for rapid expansion. This is something that can be tabled until his hematuria has been solved. He is aware that he will need either ballooning versus proximal extension of the graft for seal.   He has an appointment scheduled with our office in the coming weeks.

## 2024-03-17 NOTE — H&P (Signed)
 History and Physical    Nicholas Burke FMW:[id number] DOB: [birth year] DOA: 03/17/2024  PCP: Auston Opal, DO (Confirm with patient/family/NH records and if not entered, this has to be entered at [facility] point of entry) Patient coming from: Home  I have personally briefly reviewed patient's old medical records in [facility]  Chief Complaint: Persistent blood in urine, abdominal pain  HPI: Nicholas Burke is a 78 y.o. male with medical history significant of AAA with recent type B aortic dissection and intramural aortic hematoma status post endovascular repair September - October 2025, HTN, CKD stage IV, prediabetes, prostate cancer status pos radiation therapy in 2014, with recurrence and right Hemi cryoablation prostate 2022, presented with worsening lower abdominal pain and gross hematuria.  Patient has a known AAA was diagnosed with type B dissection and intramural aortic hematoma for which he underwent endovascular repair on October 20.  He was discharged home on October 22 and since then patient has had intermittent hematuria, usually cleared by self within 24 hours.  Last few weeks, he started having more frequent/hematuria episode associated with lower abdomen cramping like pain.  Denies any fever or chills.  2 days ago, he started to have another episode but this time the hematuria has become more severe, he also developed a incontinence with heavy blood staining on his underwear.  ED Course: Afebrile, blood pressure 102/60 O2 saturation 100% on room air.  CT abdomen pelvis showed no descending thoracic aortic graft with endoleak along the proximal margin.  Heterogeneous hyperdensity feeling in the majority of the bladder with hyperdense fluid fluid favored as hematoma.  Underlying breast mass cannot be ruled out.  Blood work showed WBC 10.7 hemoglobin 7.5 compared to baseline 8.8-10.0 from last months.  Creatinine 2.0 compared to baseline 1.4-1.5.  Review of Systems: As per HPI otherwise 14  point review of systems negative.    Past Medical History:  Diagnosis Date   AAA (abdominal aortic aneurysm)    Hx of radiation therapy 01/02/13- 02/24/13   prostate 7800 cGy/40 sessions, seminal vesicles 5600 cGy/40 sessions   Hypertension    Pre-diabetes    Prostate cancer (HCC) 08/30/2012   gleason 6, vol 65.7 cc    Past Surgical History:  Procedure Laterality Date   CATARACT EXTRACTION, BILATERAL Bilateral    COLONOSCOPY WITH PROPOFOL  N/A 10/18/2014   Procedure: COLONOSCOPY WITH PROPOFOL ;  Surgeon: [name], MD;  Location: [facility] ENDOSCOPY;  Service: Endoscopy;  Laterality: N/A;   CRYOABLATION N/A 12/24/2020   Procedure: CRYO ABLATION PROSTATE;  Surgeon: [name], MD;  Location: [facility] ORS;  Service: Urology;  Laterality: N/A;   ESOPHAGOGASTRODUODENOSCOPY N/A 12/20/2023   Procedure: EGD (ESOPHAGOGASTRODUODENOSCOPY);  Surgeon: [name], MD;  Location: [facility] ENDOSCOPY;  Service: Gastroenterology;  Laterality: N/A;   ESOPHAGOGASTRODUODENOSCOPY N/A 12/26/2023   Procedure: EGD (ESOPHAGOGASTRODUODENOSCOPY);  Surgeon: [name], MD;  Location: [facility] ENDOSCOPY;  Service: Gastroenterology;  Laterality: N/A;   EYE SURGERY Bilateral    cataract surgery   HOT HEMOSTASIS N/A 10/18/2014   Procedure: HOT HEMOSTASIS (ARGON PLASMA COAGULATION/BICAP);  Surgeon: [name], MD;  Location: [facility];  Service: Endoscopy;  Laterality: N/A;   PROSTATE BIOPSY  08/2012   THORACIC AORTIC ENDOVASCULAR STENT GRAFT Right 02/14/2024   Procedure: INSERTION, ENDOVASCULAR STENT GRAFT, AORTA, THORACIC;  Surgeon: Lanis Fonda BRAVO, MD;  Location: [facility] OR;  Service: Vascular;  Laterality: Right;   ULTRASOUND GUIDANCE Right 02/14/2024   Procedure: ULTRASOUND GUIDANCE;  Surgeon: Lanis Fonda BRAVO, MD;  Location: [facility] OR;  Service: Vascular;  Laterality: Right;     reports that he has never smoked. He has never used smokeless tobacco. He reports that he does not drink alcohol and does not use  drugs.  Allergies  Allergen Reactions   Reglan  [Metoclopramide ]     Pt states that it made him shake and had difficulty speaking    Family History  Problem Relation Age of Onset   Prostate cancer Father    Prostate cancer Brother      Prior to Admission medications   Medication Sig Start Date End Date Taking? Authorizing Provider  albuterol  (VENTOLIN  HFA) 108 (90 Base) MCG/ACT inhaler Inhale 2 puffs into the lungs every 4 (four) hours as needed for wheezing or shortness of breath. 01/01/24   Cheryle Page, MD  amLODipine  (NORVASC ) 10 MG tablet Take 1 tablet (10 mg total) by mouth daily. 02/02/24   Azobou Donley Joelle DEL, MD  aspirin  EC 81 MG tablet Take 1 tablet (81 mg total) by mouth daily at 6 (six) AM. Swallow whole. 02/16/24   Bethanie Cough, PA-C  carvedilol  (COREG ) 25 MG tablet Take 1 tablet (25 mg total) by mouth 2 (two) times daily with food. 02/29/24   Azobou Donley Joelle DEL, MD  hydrALAZINE  (APRESOLINE ) 50 MG tablet Take 50 mg by mouth 3 (three) times daily.    [provider]  lisinopril  (ZESTRIL ) 20 MG tablet Take 1 tablet (20 mg total) by mouth daily. 02/29/24   Azobou Donley Joelle DEL, MD  Multiple Vitamins-Minerals (MULTIVITAMIN WITH MINERALS) tablet Take 1 tablet by mouth daily. Men 50+    [provider]  Nystatin (GERHARDT'S BUTT CREAM) CREA Apply 1 Application topically daily as needed for irritation.    [provider]  oxyCODONE -acetaminophen  (PERCOCET/ROXICET) 5-325 MG tablet Take 1 tablet by mouth every 6 (six) hours as needed for moderate pain (pain score 4-6). 02/15/24   Bethanie Cough, PA-C  pantoprazole  (PROTONIX ) 40 MG tablet Take 1 tablet (40 mg total) by mouth 2 (two) times daily before a meal. 01/01/24   Cheryle Page, MD  polyethylene glycol powder (GLYCOLAX /MIRALAX ) 17 GM/SCOOP powder Dissolve 1 capful (17g) in 4-8 ounces of liquid and take by mouth daily. 01/02/24   Cheryle Page, MD  rosuvastatin  (CRESTOR ) 5 MG tablet Take 5 mg  by mouth 3 (three) times a week. 11/21/20   [provider]    Physical Exam: Vitals:   03/17/24 1137 03/17/24 1430 03/17/24 1609  BP: 102/60 117/66   Pulse: 60 81   Resp: 16 16   Temp: 98.1 F (36.7 C)  98.1 F (36.7 C)  TempSrc: Oral  Oral  SpO2: 100% 99%     Constitutional: NAD, calm, comfortable Vitals:   03/17/24 1137 03/17/24 1430 03/17/24 1609  BP: 102/60 117/66   Pulse: 60 81   Resp: 16 16   Temp: 98.1 F (36.7 C)  98.1 F (36.7 C)  TempSrc: Oral  Oral  SpO2: 100% 99%    Eyes: PERRL, lids and conjunctivae normal ENMT: Mucous membranes are moist. Posterior pharynx clear of any exudate or lesions.Normal dentition.  Neck: normal, supple, no masses, no thyromegaly Respiratory: clear to auscultation bilaterally, no wheezing, no crackles. Normal respiratory effort. No accessory muscle use.  Cardiovascular: Regular rate and rhythm, no murmurs / rubs / gallops. No extremity edema. 2+ pedal pulses. No carotid bruits.  Abdomen: no tenderness, no masses palpated. No hepatosplenomegaly. Bowel sounds positive.  Musculoskeletal: no clubbing / cyanosis. No joint deformity upper and lower extremities. Good ROM, no contractures.  Normal muscle tone.  Skin: no rashes, lesions, ulcers. No induration Neurologic: CN 2-12 grossly intact. Sensation intact, DTR normal. Strength 5/5 in all 4.  Psychiatric: Normal judgment and insight. Alert and oriented x 3. Normal mood.     Labs on Admission: I have personally reviewed following labs and imaging studies  CBC: Recent Labs  Lab 03/17/24 1152 03/17/24 1559  WBC 10.7*  --   HGB 7.5* 7.5*  HCT 25.6* 25.2*  MCV 74.9*  --   PLT 440*  --    Basic Metabolic Panel: Recent Labs  Lab 03/17/24 1152  NA 136  K 3.7  CL 99  CO2 25  GLUCOSE 145*  BUN 32*  CREATININE 2.06*  CALCIUM  9.5   GFR: Estimated Creatinine Clearance: 31 mL/min (A) (by C-G formula based on SCr of 2.06 mg/dL (H)). Liver Function Tests: No results for  input(s): AST, ALT, ALKPHOS, BILITOT, PROT, ALBUMIN  in the last 168 hours. No results for input(s): LIPASE, AMYLASE in the last 168 hours. No results for input(s): AMMONIA in the last 168 hours. Coagulation Profile: No results for input(s): INR, PROTIME in the last 168 hours. Cardiac Enzymes: No results for input(s): CKTOTAL, CKMB, CKMBINDEX, TROPONINI in the last 168 hours. BNP (last 3 results) No results for input(s): PROBNP in the last 8760 hours. HbA1C: No results for input(s): HGBA1C in the last 72 hours. CBG: No results for input(s): GLUCAP in the last 168 hours. Lipid Profile: No results for input(s): CHOL, HDL, LDLCALC, TRIG, CHOLHDL, LDLDIRECT in the last 72 hours. Thyroid Function Tests: No results for input(s): TSH, T4TOTAL, FREET4, T3FREE, THYROIDAB in the last 72 hours. Anemia Panel: No results for input(s): VITAMINB12, FOLATE, FERRITIN, TIBC, IRON, RETICCTPCT in the last 72 hours. Urine analysis:    Component Value Date/Time   COLORURINE (A) 03/17/2024 1437    TEST NOT REPORTED DUE TO COLOR INTERFERENCE OF URINE PIGMENT   APPEARANCEUR (A) 03/17/2024 1437    TEST NOT REPORTED DUE TO COLOR INTERFERENCE OF URINE PIGMENT   LABSPEC  03/17/2024 1437    TEST NOT REPORTED DUE TO COLOR INTERFERENCE OF URINE PIGMENT   LABSPEC 1.005 01/16/2013 0953   PHURINE  03/17/2024 1437    TEST NOT REPORTED DUE TO COLOR INTERFERENCE OF URINE PIGMENT   GLUCOSEU (A) 03/17/2024 1437    TEST NOT REPORTED DUE TO COLOR INTERFERENCE OF URINE PIGMENT   GLUCOSEU Negative 01/16/2013 0953   HGBUR (A) 03/17/2024 1437    TEST NOT REPORTED DUE TO COLOR INTERFERENCE OF URINE PIGMENT   BILIRUBINUR (A) 03/17/2024 1437    TEST NOT REPORTED DUE TO COLOR INTERFERENCE OF URINE PIGMENT   BILIRUBINUR Negative 01/16/2013 0953   KETONESUR (A) 03/17/2024 1437    TEST NOT REPORTED DUE TO COLOR INTERFERENCE OF URINE PIGMENT   PROTEINUR (A)  03/17/2024 1437    TEST NOT REPORTED DUE TO COLOR INTERFERENCE OF URINE PIGMENT   UROBILINOGEN 0.2 01/16/2013 0953   NITRITE (A) 03/17/2024 1437    TEST NOT REPORTED DUE TO COLOR INTERFERENCE OF URINE PIGMENT   LEUKOCYTESUR (A) 03/17/2024 1437    TEST NOT REPORTED DUE TO COLOR INTERFERENCE OF URINE PIGMENT   LEUKOCYTESUR Trace 01/16/2013 0953    Radiological Exams on Admission: CT Angio Chest/Abd/Pel for Dissection W and/or W/WO Result Date: 03/17/2024 CLINICAL DATA:  Acute aortic syndrome suspected. Blood in urine. Dysuria. EXAM: CT ANGIOGRAPHY CHEST, ABDOMEN AND PELVIS TECHNIQUE: Noncontrast chest CT was performed. Multidetector CT imaging through the chest, abdomen and pelvis was performed  using the standard protocol during bolus administration of intravenous contrast. Multiplanar reconstructed images and MIPs were obtained and reviewed to evaluate the vascular anatomy. RADIATION DOSE REDUCTION: This exam was performed according to the departmental dose-optimization program which includes automated exposure control, adjustment of the mA and/or kV according to patient size and/or use of iterative reconstruction technique. CONTRAST:  80mL OMNIPAQUE  IOHEXOL  350 MG/ML SOLN COMPARISON:  CT chest abdomen and pelvis 12/20/2023 FINDINGS: CTA CHEST FINDINGS Cardiovascular: There is a new descending thoracic aortic graft in place which appears widely patent. Intramural hematoma throughout the descending thoracic aorta again seen. The descending thoracic aorta is dilated measuring 5.4 cm. There is an endoleak along the proximal margin of the endograft with contrast filling the intramural hematoma. Origin of great vessels within normal limits. The heart is normal in size.  There is no pericardial effusion. Mediastinum/Nodes: No enlarged mediastinal, hilar, or axillary lymph nodes. Thyroid gland, trachea, and esophagus demonstrate no significant findings. There are numerous nonenlarged mediastinal and hilar lymph  nodes. Lungs/Pleura: There is a mild patchy ground-glass opacities in both lower lobes, left greater than right. Left pleural effusion has resolved. There is no focal lung consolidation or pneumothorax. Musculoskeletal: Degenerative changes affect the spine. Review of the MIP images confirms the above findings. CTA ABDOMEN AND PELVIS FINDINGS VASCULAR Aorta: There is aneurysmal dilatation of the proximal abdominal aorta at the level of the diaphragm measuring 2.7 x 3.8 cm similar to the prior study. Again seen is a small amount of contrast extravasation into intramural hematoma along the posterior left aspect of the aorta, slightly increased from prior examination. Infrarenal abdominal aortic aneurysm measures 3.0 cm and is unchanged in size. Focal dissection or region leakage of contrast into the intramural hematoma has increased at this level. Celiac: Patent without evidence of aneurysm, dissection, vasculitis or significant stenosis. SMA: Patent without evidence of aneurysm, dissection, vasculitis or significant stenosis. Renals: Both renal arteries are patent without evidence of aneurysm, dissection, vasculitis, fibromuscular dysplasia or significant stenosis. IMA: Patent without evidence of aneurysm, dissection, vasculitis or significant stenosis. Inflow: Patent without evidence of aneurysm, dissection, vasculitis or significant stenosis. Veins: No obvious venous abnormality within the limitations of this arterial phase study. Review of the MIP images confirms the above findings. NON-VASCULAR Hepatobiliary: Gallstones are present. There is no biliary ductal dilatation. The liver is within normal limits. Pancreas: Unremarkable. No pancreatic ductal dilatation or surrounding inflammatory changes. Spleen: Normal in size without focal abnormality. Adrenals/Urinary Tract: There is heterogeneous hyperdensity filling the majority of the bladder with hyperdense fluid fluid level favored as hematoma. There is mild  stranding surrounding the bladder. There is a subcentimeter hypodensity in the superior pole the right kidney which is unchanged, likely a cyst. Otherwise, the kidneys and adrenal glands are within normal limits. Stomach/Bowel: Stomach is within normal limits. Appendix appears normal. No evidence of bowel wall thickening, distention, or inflammatory changes. There is sigmoid colon diverticulosis. There some borderline dilated loops of jejunum with air-fluid levels in the upper abdomen without definite transition. Lymphatic: No enlarged lymph nodes are identified. Reproductive: Prostate radiotherapy seeds are present. Other: Small left and moderate right fat containing inguinal hernias are present. There is a small fat containing umbilical hernia. There is no ascites. Musculoskeletal: No acute or significant osseous findings. Review of the MIP images confirms the above findings. IMPRESSION: Vascular 1. New descending thoracic aortic graft in place. There is an endoleak along the proximal margin of the endograft with contrast filling the intramural hematoma. 2. Stable  aneurysmal dilatation of the proximal abdominal aorta at the level of the diaphragm measuring 3.8 cm. 3. Stable infrarenal abdominal aortic aneurysm measuring 3.0 cm. Focal dissection or region of leakage of contrast into the intramural hematoma has increased at this level. Nonvascular 1. Heterogeneous hyperdensity filling the majority of the bladder with hyperdense fluid fluid level favored as hematoma. There is mild stranding surrounding the bladder. Underlying bladder mass is not excluded. 2. Mild patchy ground-glass opacities in both lower lobes, left greater than right, likely infectious/inflammatory. 3. Cholelithiasis. 4. Borderline dilated loops of jejunum with air-fluid levels in the upper abdomen without definite transition point. Findings may represent ileus. Aortic Atherosclerosis (ICD10-I70.0). Electronically Signed   By: Greig Pique M.D.    On: 03/17/2024 15:43    EKG:  None  Assessment/Plan Principal Problem:   Gross hematuria Active Problems:   Symptomatic anemia  (please populate well all problems here in Problem List. (For example, if patient is on BP meds at home and you resume or decide to hold them, it is a problem that needs to be her. Same for CAD, COPD, HLD and so on)  Acute/subacute gross hematuria Acute blood loss anemia - Given there is a history of recurrent prostate cancer 3 years ago, and today's CT finding of the bladder, clinically suspect urology malignancy. - Urology consulted who ordered three-way catheter for continuous bladder irrigation (CBI) - Recheck H&H tonight - Consider transfusion for hemodynamic instability. - Check iron study  AAA Recent descending aortic dissection and intramural hematoma status post endovascular repair -Radiology reported findings implying endoleak from the graft, CT images were reviewed by vascular surgeon who recommended no emergency surgery indicated.  Hypotension -Clinically suspect iatrogenic effect from multiple BP meds. -Will hold off amlodipine , lisinopril  and hydralazine  -Cut done Coreg  dosage to 6.25 mg twice daily -As needed hydralazine  -Check orthostatic vital signs  AKI on CKD stage IV -Likely secondary to persistent hypotension -BP meds adjustment as above  IIDM - Most recent A1c 6.3 - Continue diet control.  Unintentional weight loss Anorexia -25-30 pounds of weight loss in last 4-5 months. - Recurrent malignancy?  Outpatient GI workup?   DVT prophylaxis: SCD Code Status: Full code Family Communication: Daughter at bedside Disposition Plan: Patient sick with hematuria, acute blood loss anemia requiring emergency urology intervention, expect more than 2 midnight hospital stay. Consults called: Urology, ED discussed case with vascular surgeon over the phone. Admission status: Telemetry admission  Cort ONEIDA Mana MD [facility] Pager  [phone]  03/17/2024, 5:33 PM

## 2024-03-17 NOTE — ED Triage Notes (Signed)
 Pt ambulatory to triage with complaints of blood in his urine that began 2 days ago. Pt endorses dysuria. Pt states that he takes aspirin  daily.

## 2024-03-17 NOTE — ED Notes (Addendum)
 Attempt made to place foley catheter. Andrez NP made aware that attempt was unsuccessful.

## 2024-03-17 NOTE — Progress Notes (Signed)
     Patient Name: Nicholas Burke           DOB: [birth year]  MRN: [id number]      Admission Date: 03/17/2024  Attending Provider: Laurita Cort DASEN, MD  Primary Diagnosis: Gross hematuria   Level of care: Telemetry   OVERNIGHT EVENT   Notified by bedside RN that she was unsuccessful in placing a three-way catheter for continuous bladder irrigation.  Urology consult was made by EDP earlier today.  RN has been asked to page urology to assist with catheter placement.   Nicholas Shadden, DNP, ACNPC- AG [facility] [place]

## 2024-03-17 NOTE — Consult Note (Signed)
 Urology Consult   Physician requesting consult: Cort Mana, MD  Reason for consult: Hematuria and foley catheter placement  History of Present Illness: Nicholas Burke is a 78 y.o. male with PMH prostate cancer s/p radiation (2014) with recurrence and partial gland cryoablation (2022 with Dr. Nieves, most recent PSA 02/25 undetectable) who presented to [facility] ED with difficulty voiding and passage of clots per urethra, which had been ongoing for 3 days.   AFVSS in the ED. Labs notable for Cr 2.06 from baseline ~1.0. Hgb 7.5 from recent baseline ~10.0. CT notable for large clot in the bladder. Patient takes baby aspirin , no other AC. Pt reports he has had intermittent hematuria since his hospitalization in September, but it has usually cleared on its own and not prevented him from voiding. When the bleeding got worse and he had trouble emptying his bladder, he decided to present to the ED.  Pt follows with Dr. Cam for prostate cancer as well as some minor stress incontinence and intermittent hematuria. Pt had previously been on finasteride  for prostate varices and hematuria but stopped it in Feb 2025 as this had improved.  I was able to scope in an 18 Fr 3-way catheter at bedside and hand irrigate the bladder with return of small volume clot. Urine initially merlot. CBI was initiated with urine light red on moderate drip.   Past Medical History:  Diagnosis Date   AAA (abdominal aortic aneurysm)    Hx of radiation therapy 01/02/13- 02/24/13   prostate 7800 cGy/40 sessions, seminal vesicles 5600 cGy/40 sessions   Hypertension    Pre-diabetes    Prostate cancer (HCC) 08/30/2012   gleason 6, vol 65.7 cc    Past Surgical History:  Procedure Laterality Date   CATARACT EXTRACTION, BILATERAL Bilateral    COLONOSCOPY WITH PROPOFOL  N/A 10/18/2014   Procedure: COLONOSCOPY WITH PROPOFOL ;  Surgeon: [name], MD;  Location: [facility] ENDOSCOPY;  Service: Endoscopy;  Laterality: N/A;   CRYOABLATION N/A  12/24/2020   Procedure: CRYO ABLATION PROSTATE;  Surgeon: [name], MD;  Location: [facility] ORS;  Service: Urology;  Laterality: N/A;   ESOPHAGOGASTRODUODENOSCOPY N/A 12/20/2023   Procedure: EGD (ESOPHAGOGASTRODUODENOSCOPY);  Surgeon: [name], MD;  Location: [facility] ENDOSCOPY;  Service: Gastroenterology;  Laterality: N/A;   ESOPHAGOGASTRODUODENOSCOPY N/A 12/26/2023   Procedure: EGD (ESOPHAGOGASTRODUODENOSCOPY);  Surgeon: [name], MD;  Location: [facility] ENDOSCOPY;  Service: Gastroenterology;  Laterality: N/A;   EYE SURGERY Bilateral    cataract surgery   HOT HEMOSTASIS N/A 10/18/2014   Procedure: HOT HEMOSTASIS (ARGON PLASMA COAGULATION/BICAP);  Surgeon: [name], MD;  Location: [facility];  Service: Endoscopy;  Laterality: N/A;   PROSTATE BIOPSY  08/2012   THORACIC AORTIC ENDOVASCULAR STENT GRAFT Right 02/14/2024   Procedure: INSERTION, ENDOVASCULAR STENT GRAFT, AORTA, THORACIC;  Surgeon: Lanis Fonda BRAVO, MD;  Location: [facility] OR;  Service: Vascular;  Laterality: Right;   ULTRASOUND GUIDANCE Right 02/14/2024   Procedure: ULTRASOUND GUIDANCE;  Surgeon: Lanis Fonda BRAVO, MD;  Location: [facility] OR;  Service: Vascular;  Laterality: Right;    Current Hospital Medications:  Home Meds:  No current facility-administered medications on file prior to encounter.   Current Outpatient Medications on File Prior to Encounter  Medication Sig Dispense Refill   Acetaminophen  Extra Strength 500 MG CAPS Take 500-1,000 mg by mouth See admin instructions. TYLENOL  Extra Strength Rapid Release Gels - Take 500-1,000 mg by mouth every 8 hours as needed for pain     amLODipine  (NORVASC ) 10 MG tablet Take 1 tablet (10 mg total) by  mouth daily. 90 tablet 3   aspirin  EC 81 MG tablet Take 1 tablet (81 mg total) by mouth daily at 6 (six) AM. Swallow whole.     carvedilol  (COREG ) 25 MG tablet Take 1 tablet (25 mg total) by mouth 2 (two) times daily with food. 180 tablet 1   hydrALAZINE  (APRESOLINE ) 50 MG tablet Take 50 mg  by mouth 3 (three) times daily.     lisinopril  (ZESTRIL ) 20 MG tablet Take 1 tablet (20 mg total) by mouth daily. 90 tablet 1   Multiple Vitamins-Minerals (MENS 50+ MULTIVITAMIN) TABS Take 1 tablet by mouth daily with breakfast.     oxyCODONE -acetaminophen  (PERCOCET/ROXICET) 5-325 MG tablet Take 1 tablet by mouth every 6 (six) hours as needed for moderate pain (pain score 4-6). 10 tablet 0   pantoprazole  (PROTONIX ) 40 MG tablet Take 1 tablet (40 mg total) by mouth 2 (two) times daily before a meal. 60 tablet 1   polyethylene glycol powder (GLYCOLAX /MIRALAX ) 17 GM/SCOOP powder Dissolve 1 capful (17g) in 4-8 ounces of liquid and take by mouth daily. (Patient taking differently: Take 17 g by mouth daily as needed for mild constipation.) 238 g 0   rosuvastatin  (CRESTOR ) 5 MG tablet Take 5 mg by mouth every Monday, Wednesday, and Friday at 6 PM.     albuterol  (VENTOLIN  HFA) 108 (90 Base) MCG/ACT inhaler Inhale 2 puffs into the lungs every 4 (four) hours as needed for wheezing or shortness of breath. (Patient not taking: Reported on 03/17/2024) 6.7 g 0     Scheduled Meds:  [START ON 03/18/2024] aspirin  EC  81 mg Oral Q0600   carvedilol   6.25 mg Oral BID   pantoprazole   40 mg Oral BID AC   [START ON 03/18/2024] polyethylene glycol  17 g Oral Daily   rosuvastatin   5 mg Oral Once per day on Monday Wednesday Friday   Continuous Infusions:  [START ON 03/18/2024] cefTRIAXone  (ROCEPHIN )  IV     sodium chloride  irrigation     PRN Meds:.acetaminophen  **OR** acetaminophen , albuterol , hydrALAZINE , HYDROmorphone  (DILAUDID ) injection, ondansetron  **OR** ondansetron  (ZOFRAN ) IV, oxyCODONE -acetaminophen   Allergies:  Allergies  Allergen Reactions   Reglan  [Metoclopramide ] Other (See Comments)    Pt states that it made him shake and had difficulty speaking    Family History  Problem Relation Age of Onset   Prostate cancer Father    Prostate cancer Brother     Social History:  reports that he has never  smoked. He has never used smokeless tobacco. He reports that he does not drink alcohol and does not use drugs.  ROS: A complete review of systems was performed.  All systems are negative except for pertinent findings as noted.  Physical Exam:  Vital signs in last 24 hours: Temp:  [98 F (36.7 C)-98.1 F (36.7 C)] 98 F (36.7 C) (11/21 1945) Pulse Rate:  [60-87] 87 (11/21 1945) Resp:  [16-18] 18 (11/21 1945) BP: (101-117)/(60-68) 108/68 (11/21 1945) SpO2:  [98 %-100 %] 99 % (11/21 1945) Constitutional:  Alert and oriented, No acute distress Cardiovascular: Regular rate and rhythm, No JVD Respiratory: Normal respiratory effort, Lungs clear bilaterally GI: Abdomen is soft, nondistended GU: Foley catheter in place draining thin red urine Lymphatic: No lymphadenopathy Neurologic: Grossly intact, no focal deficits Psychiatric: Normal mood and affect  Laboratory Data:  Recent Labs    03/17/24 1152 03/17/24 1559  WBC 10.7*  --   HGB 7.5* 7.5*  HCT 25.6* 25.2*  PLT 440*  --     Recent  Labs    03/17/24 1152  NA 136  K 3.7  CL 99  GLUCOSE 145*  BUN 32*  CALCIUM  9.5  CREATININE 2.06*     Results for orders placed or performed during the hospital encounter of 03/17/24 (from the past 24 hours)  Basic metabolic panel     Status: Abnormal   Collection Time: 03/17/24 11:52 AM  Result Value Ref Range   Sodium 136 135 - 145 mmol/L   Potassium 3.7 3.5 - 5.1 mmol/L   Chloride 99 98 - 111 mmol/L   CO2 25 22 - 32 mmol/L   Glucose, Bld 145 (H) 70 - 99 mg/dL   BUN 32 (H) 8 - 23 mg/dL   Creatinine, Ser 7.93 (H) 0.61 - 1.24 mg/dL   Calcium  9.5 8.9 - 10.3 mg/dL   GFR, Estimated 33 (L) >60 mL/min   Anion gap 12 5 - 15  CBC     Status: Abnormal   Collection Time: 03/17/24 11:52 AM  Result Value Ref Range   WBC 10.7 (H) 4.0 - 10.5 K/uL   RBC 3.42 (L) 4.22 - 5.81 MIL/uL   Hemoglobin 7.5 (L) 13.0 - 17.0 g/dL   HCT 74.3 (L) 60.9 - 47.9 %   MCV 74.9 (L) 80.0 - 100.0 fL   MCH 21.9  (L) 26.0 - 34.0 pg   MCHC 29.3 (L) 30.0 - 36.0 g/dL   RDW 80.7 (H) 88.4 - 84.4 %   Platelets 440 (H) 150 - 400 K/uL   nRBC 0.0 0.0 - 0.2 %  Urinalysis, w/ Reflex to Culture (Infection Suspected) -Urine, Clean Catch     Status: Abnormal   Collection Time: 03/17/24  2:37 PM  Result Value Ref Range   Specimen Source URINE, CLEAN CATCH    Color, Urine (A) YELLOW    TEST NOT REPORTED DUE TO COLOR INTERFERENCE OF URINE PIGMENT   APPearance (A) CLEAR    TEST NOT REPORTED DUE TO COLOR INTERFERENCE OF URINE PIGMENT   Specific Gravity, Urine  1.005 - 1.030    TEST NOT REPORTED DUE TO COLOR INTERFERENCE OF URINE PIGMENT   pH  5.0 - 8.0    TEST NOT REPORTED DUE TO COLOR INTERFERENCE OF URINE PIGMENT   Glucose, UA (A) NEGATIVE mg/dL    TEST NOT REPORTED DUE TO COLOR INTERFERENCE OF URINE PIGMENT   Hgb urine dipstick (A) NEGATIVE    TEST NOT REPORTED DUE TO COLOR INTERFERENCE OF URINE PIGMENT   Bilirubin Urine (A) NEGATIVE    TEST NOT REPORTED DUE TO COLOR INTERFERENCE OF URINE PIGMENT   Ketones, ur (A) NEGATIVE mg/dL    TEST NOT REPORTED DUE TO COLOR INTERFERENCE OF URINE PIGMENT   Protein, ur (A) NEGATIVE mg/dL    TEST NOT REPORTED DUE TO COLOR INTERFERENCE OF URINE PIGMENT   Nitrite (A) NEGATIVE    TEST NOT REPORTED DUE TO COLOR INTERFERENCE OF URINE PIGMENT   Leukocytes,Ua (A) NEGATIVE    TEST NOT REPORTED DUE TO COLOR INTERFERENCE OF URINE PIGMENT   RBC / HPF 0-5 0 - 5 RBC/hpf   WBC, UA 0-5 0 - 5 WBC/hpf   Bacteria, UA NONE SEEN NONE SEEN   Squamous Epithelial / HPF 0-5 0 - 5 /HPF  Hemoglobin and hematocrit, blood     Status: Abnormal   Collection Time: 03/17/24  3:59 PM  Result Value Ref Range   Hemoglobin 7.5 (L) 13.0 - 17.0 g/dL   HCT 74.7 (L) 60.9 - 47.9 %   No  results found for this or any previous visit (from the past 240 hours).  Renal Function: Recent Labs    03/17/24 1152  CREATININE 2.06*   Estimated Creatinine Clearance: 31 mL/min (A) (by C-G formula based on SCr of  2.06 mg/dL (H)).  Radiologic Imaging: CT Angio Chest/Abd/Pel for Dissection W and/or W/WO Result Date: 03/17/2024 CLINICAL DATA:  Acute aortic syndrome suspected. Blood in urine. Dysuria. EXAM: CT ANGIOGRAPHY CHEST, ABDOMEN AND PELVIS TECHNIQUE: Noncontrast chest CT was performed. Multidetector CT imaging through the chest, abdomen and pelvis was performed using the standard protocol during bolus administration of intravenous contrast. Multiplanar reconstructed images and MIPs were obtained and reviewed to evaluate the vascular anatomy. RADIATION DOSE REDUCTION: This exam was performed according to the departmental dose-optimization program which includes automated exposure control, adjustment of the mA and/or kV according to patient size and/or use of iterative reconstruction technique. CONTRAST:  80mL OMNIPAQUE  IOHEXOL  350 MG/ML SOLN COMPARISON:  CT chest abdomen and pelvis 12/20/2023 FINDINGS: CTA CHEST FINDINGS Cardiovascular: There is a new descending thoracic aortic graft in place which appears widely patent. Intramural hematoma throughout the descending thoracic aorta again seen. The descending thoracic aorta is dilated measuring 5.4 cm. There is an endoleak along the proximal margin of the endograft with contrast filling the intramural hematoma. Origin of great vessels within normal limits. The heart is normal in size.  There is no pericardial effusion. Mediastinum/Nodes: No enlarged mediastinal, hilar, or axillary lymph nodes. Thyroid gland, trachea, and esophagus demonstrate no significant findings. There are numerous nonenlarged mediastinal and hilar lymph nodes. Lungs/Pleura: There is a mild patchy ground-glass opacities in both lower lobes, left greater than right. Left pleural effusion has resolved. There is no focal lung consolidation or pneumothorax. Musculoskeletal: Degenerative changes affect the spine. Review of the MIP images confirms the above findings. CTA ABDOMEN AND PELVIS FINDINGS  VASCULAR Aorta: There is aneurysmal dilatation of the proximal abdominal aorta at the level of the diaphragm measuring 2.7 x 3.8 cm similar to the prior study. Again seen is a small amount of contrast extravasation into intramural hematoma along the posterior left aspect of the aorta, slightly increased from prior examination. Infrarenal abdominal aortic aneurysm measures 3.0 cm and is unchanged in size. Focal dissection or region leakage of contrast into the intramural hematoma has increased at this level. Celiac: Patent without evidence of aneurysm, dissection, vasculitis or significant stenosis. SMA: Patent without evidence of aneurysm, dissection, vasculitis or significant stenosis. Renals: Both renal arteries are patent without evidence of aneurysm, dissection, vasculitis, fibromuscular dysplasia or significant stenosis. IMA: Patent without evidence of aneurysm, dissection, vasculitis or significant stenosis. Inflow: Patent without evidence of aneurysm, dissection, vasculitis or significant stenosis. Veins: No obvious venous abnormality within the limitations of this arterial phase study. Review of the MIP images confirms the above findings. NON-VASCULAR Hepatobiliary: Gallstones are present. There is no biliary ductal dilatation. The liver is within normal limits. Pancreas: Unremarkable. No pancreatic ductal dilatation or surrounding inflammatory changes. Spleen: Normal in size without focal abnormality. Adrenals/Urinary Tract: There is heterogeneous hyperdensity filling the majority of the bladder with hyperdense fluid fluid level favored as hematoma. There is mild stranding surrounding the bladder. There is a subcentimeter hypodensity in the superior pole the right kidney which is unchanged, likely a cyst. Otherwise, the kidneys and adrenal glands are within normal limits. Stomach/Bowel: Stomach is within normal limits. Appendix appears normal. No evidence of bowel wall thickening, distention, or  inflammatory changes. There is sigmoid colon diverticulosis. There some borderline dilated loops  of jejunum with air-fluid levels in the upper abdomen without definite transition. Lymphatic: No enlarged lymph nodes are identified. Reproductive: Prostate radiotherapy seeds are present. Other: Small left and moderate right fat containing inguinal hernias are present. There is a small fat containing umbilical hernia. There is no ascites. Musculoskeletal: No acute or significant osseous findings. Review of the MIP images confirms the above findings. IMPRESSION: Vascular 1. New descending thoracic aortic graft in place. There is an endoleak along the proximal margin of the endograft with contrast filling the intramural hematoma. 2. Stable aneurysmal dilatation of the proximal abdominal aorta at the level of the diaphragm measuring 3.8 cm. 3. Stable infrarenal abdominal aortic aneurysm measuring 3.0 cm. Focal dissection or region of leakage of contrast into the intramural hematoma has increased at this level. Nonvascular 1. Heterogeneous hyperdensity filling the majority of the bladder with hyperdense fluid fluid level favored as hematoma. There is mild stranding surrounding the bladder. Underlying bladder mass is not excluded. 2. Mild patchy ground-glass opacities in both lower lobes, left greater than right, likely infectious/inflammatory. 3. Cholelithiasis. 4. Borderline dilated loops of jejunum with air-fluid levels in the upper abdomen without definite transition point. Findings may represent ileus. Aortic Atherosclerosis (ICD10-I70.0). Electronically Signed   By: Greig Pique M.D.   On: 03/17/2024 15:43    I independently reviewed the above imaging studies.  Impression/Recommendation Pt is a 78 yo man with PMH AAA with recent type B aortic dissection and intramural aortic hematoma status post endovascular repair September - October 2025, HTN, CKD stage IV, prediabetes, prostate cancer status post radiation  therapy in 2014, with recurrence and right Hemi cryoablation prostate 2022 (most recent PSA in 2/25 undetectable) who presented to [facility] ED in clot retention.   AFVSS in the ED. Labs notable for Cr 2.06 from baseline ~1.0. Hgb 7.5 from recent baseline ~10.0. UA inconclusive due to color interference but patient denies infectious symptoms. CT notable for large clot in the bladder. Patient takes baby aspirin , no other AC.   Pt is currently stable with 3-way catheter in place on CBI. Unfortunately, the clot in patient's bladder appeared quite well-organized and may be challenging to clear with hand irrigation alone.   - Continue CBI, titrate to thin pink urine  - If catheter stops draining, immediately clamp CBI in-flow, detach catheter from bag, and flush catheter port with 120 cc sterile water  using a catheter tip syringe (importance of this was stressed to patient and nursing)  - If unable to restore patency, page Urology - NPO at midnight in case need for OR clot evacuation - Recommend 1g ceftriaxone  given extensive urethral manipulation and irrigation  - Urology will continue to follow   Maurilio Agar 03/17/2024, 9:53 PM

## 2024-03-18 ENCOUNTER — Inpatient Hospital Stay (HOSPITAL_COMMUNITY): Admitting: Anesthesiology

## 2024-03-18 ENCOUNTER — Encounter (HOSPITAL_COMMUNITY): Payer: Self-pay | Admitting: Internal Medicine

## 2024-03-18 ENCOUNTER — Encounter (HOSPITAL_COMMUNITY)
Admission: EM | Disposition: A | Discharge status: Home / Self Care | Payer: Self-pay | Source: Home / Self Care | Attending: Internal Medicine

## 2024-03-18 DIAGNOSIS — E119 Type 2 diabetes mellitus without complications: Secondary | ICD-10-CM

## 2024-03-18 DIAGNOSIS — D494 Neoplasm of unspecified behavior of bladder: Secondary | ICD-10-CM | POA: Diagnosis not present

## 2024-03-18 DIAGNOSIS — N3289 Other specified disorders of bladder: Secondary | ICD-10-CM

## 2024-03-18 DIAGNOSIS — R338 Other retention of urine: Secondary | ICD-10-CM | POA: Diagnosis not present

## 2024-03-18 DIAGNOSIS — I10 Essential (primary) hypertension: Secondary | ICD-10-CM

## 2024-03-18 DIAGNOSIS — R310 Gross hematuria: Secondary | ICD-10-CM | POA: Diagnosis not present

## 2024-03-18 HISTORY — PX: CYSTOSCOPY WITH FULGERATION: SHX6638

## 2024-03-18 LAB — BASIC METABOLIC PANEL WITH GFR
Anion gap: 13 (ref 5–15)
BUN: 32 mg/dL — ABNORMAL HIGH (ref 8–23)
CO2: 22 mmol/L (ref 22–32)
Calcium: 9.5 mg/dL (ref 8.9–10.3)
Chloride: 101 mmol/L (ref 98–111)
Creatinine, Ser: 1.74 mg/dL — ABNORMAL HIGH (ref 0.61–1.24)
GFR, Estimated: 40 mL/min — ABNORMAL LOW (ref >60)
Glucose, Bld: 124 mg/dL — ABNORMAL HIGH (ref 70–99)
Potassium: 3.6 mmol/L (ref 3.5–5.1)
Sodium: 136 mmol/L (ref 135–145)

## 2024-03-18 LAB — HEMOGLOBIN AND HEMATOCRIT, BLOOD
HCT: 23.7 % — ABNORMAL LOW (ref 39.0–52.0)
HCT: 24.5 % — ABNORMAL LOW (ref 39.0–52.0)
HCT: 20.1 % — ABNORMAL LOW (ref 39.0–52.0)
HCT: 19.6 % — ABNORMAL LOW (ref 39.0–52.0)
Hemoglobin: 7.1 g/dL — ABNORMAL LOW (ref 13.0–17.0)
Hemoglobin: 7 g/dL — ABNORMAL LOW (ref 13.0–17.0)
Hemoglobin: 5.9 g/dL — CL (ref 13.0–17.0)
Hemoglobin: 5.7 g/dL — CL (ref 13.0–17.0)

## 2024-03-18 LAB — PREPARE RBC (CROSSMATCH)

## 2024-03-18 LAB — CBC
HCT: 24.4 % — ABNORMAL LOW (ref 39.0–52.0)
Hemoglobin: 7.3 g/dL — ABNORMAL LOW (ref 13.0–17.0)
MCH: 22.7 pg — ABNORMAL LOW (ref 26.0–34.0)
MCHC: 29.9 g/dL — ABNORMAL LOW (ref 30.0–36.0)
MCV: 75.8 fL — ABNORMAL LOW (ref 80.0–100.0)
Platelets: 353 K/uL (ref 150–400)
RBC: 3.22 MIL/uL — ABNORMAL LOW (ref 4.22–5.81)
RDW: 19.2 % — ABNORMAL HIGH (ref 11.5–15.5)
WBC: 12.1 K/uL — ABNORMAL HIGH (ref 4.0–10.5)
nRBC: 0 % (ref 0.0–0.2)

## 2024-03-18 LAB — ABO/RH: ABO/RH(D): A POS

## 2024-03-18 SURGERY — CYSTOSCOPY, WITH BLADDER FULGURATION
Anesthesia: General | Site: Bladder

## 2024-03-18 MED ORDER — ALBUMIN HUMAN 5 % IV SOLN: INTRAVENOUS | Status: DC | PRN

## 2024-03-18 MED ORDER — ALBUMIN HUMAN 5 % IV SOLN
INTRAVENOUS | Status: AC
  Filled 2024-03-18: qty 500

## 2024-03-18 MED ORDER — DEXAMETHASONE SOD PHOSPHATE PF 10 MG/ML IJ SOLN
INTRAMUSCULAR | Status: DC | PRN
  Administered 2024-03-18: 4 mg via INTRAVENOUS

## 2024-03-18 MED ORDER — PROPOFOL 10 MG/ML IV BOLUS
INTRAVENOUS | Status: DC | PRN
  Administered 2024-03-18: 120 mg via INTRAVENOUS

## 2024-03-18 MED ORDER — VASOPRESSIN 20 UNIT/ML IV SOLN
INTRAVENOUS | Status: DC | PRN
  Administered 2024-03-18: 1 [IU] via INTRAVENOUS

## 2024-03-18 MED ORDER — PHENYLEPHRINE 80 MCG/ML (10ML) SYRINGE FOR IV PUSH (FOR BLOOD PRESSURE SUPPORT)
PREFILLED_SYRINGE | INTRAVENOUS | Status: DC | PRN
  Administered 2024-03-18: 240 ug via INTRAVENOUS
  Administered 2024-03-18 (×2): 160 ug via INTRAVENOUS
  Administered 2024-03-18: 240 ug via INTRAVENOUS
  Administered 2024-03-18: 80 ug via INTRAVENOUS
  Administered 2024-03-18 (×2): 160 ug via INTRAVENOUS

## 2024-03-18 MED ORDER — FENTANYL CITRATE (PF) 50 MCG/ML IJ SOSY: 25.0000 ug | PREFILLED_SYRINGE | INTRAMUSCULAR | Status: DC | PRN

## 2024-03-18 MED ORDER — FENTANYL CITRATE (PF) 100 MCG/2ML IJ SOLN
INTRAMUSCULAR | Status: DC | PRN
  Administered 2024-03-18 (×2): 25 ug via INTRAVENOUS

## 2024-03-18 MED ORDER — ONDANSETRON HCL 4 MG/2ML IJ SOLN
INTRAMUSCULAR | Status: DC | PRN
  Administered 2024-03-18: 4 mg via INTRAVENOUS

## 2024-03-18 MED ORDER — SODIUM CHLORIDE 0.9% IV SOLUTION: Freq: Once | INTRAVENOUS | Status: AC

## 2024-03-18 MED ORDER — PHENYLEPHRINE 80 MCG/ML (10ML) SYRINGE FOR IV PUSH (FOR BLOOD PRESSURE SUPPORT)
PREFILLED_SYRINGE | INTRAVENOUS | Status: AC
  Filled 2024-03-18: qty 20

## 2024-03-18 MED ORDER — VASOPRESSIN 20 UNIT/ML IV SOLN
INTRAVENOUS | Status: AC
Start: 2024-03-18 — End: 2024-03-18
  Filled 2024-03-18: qty 1

## 2024-03-18 MED ORDER — LACTATED RINGERS IV SOLN: INTRAVENOUS | Status: DC | PRN

## 2024-03-18 MED ORDER — SODIUM CHLORIDE 0.9 % IR SOLN
Status: DC | PRN
  Administered 2024-03-18: 3000 mL via INTRAVESICAL
  Administered 2024-03-18: 6000 mL via INTRAVESICAL

## 2024-03-18 MED ORDER — PHENYLEPHRINE 80 MCG/ML (10ML) SYRINGE FOR IV PUSH (FOR BLOOD PRESSURE SUPPORT)
PREFILLED_SYRINGE | INTRAVENOUS | Status: AC
  Filled 2024-03-18: qty 10

## 2024-03-18 MED ORDER — ONDANSETRON HCL 4 MG/2ML IJ SOLN
INTRAMUSCULAR | Status: AC
  Filled 2024-03-18: qty 2

## 2024-03-18 MED ORDER — LIDOCAINE HCL (PF) 2 % IJ SOLN
INTRAMUSCULAR | Status: AC
  Filled 2024-03-18: qty 5

## 2024-03-18 MED ORDER — FENTANYL CITRATE (PF) 100 MCG/2ML IJ SOLN
INTRAMUSCULAR | Status: AC
  Filled 2024-03-18: qty 2

## 2024-03-18 MED ORDER — SODIUM CHLORIDE 0.9 % IV SOLN: 10.0000 mL/h | Freq: Once | INTRAVENOUS | Status: DC

## 2024-03-18 MED ORDER — LIDOCAINE HCL (PF) 2 % IJ SOLN
INTRAMUSCULAR | Status: DC | PRN
  Administered 2024-03-18: 20 mg via INTRADERMAL

## 2024-03-18 MED ORDER — SODIUM CHLORIDE 0.9 % IV SOLN: 12.5000 mg | INTRAVENOUS | Status: DC | PRN

## 2024-03-18 SURGICAL SUPPLY — 19 items
BAG URINE DRAIN 2000ML AR STRL (UROLOGICAL SUPPLIES) IMPLANT
BAG URO CATCHER STRL LF (MISCELLANEOUS) ×2 IMPLANT
CATH FOLEY 3WAY 30CC 22FR (CATHETERS) IMPLANT
CATH HEMA 3WAY 30CC 24FR COUDE (CATHETERS) IMPLANT
DRAPE FOOT SWITCH (DRAPES) ×2 IMPLANT
ELECT REM PT RETURN 15FT ADLT (MISCELLANEOUS) IMPLANT
EVACUATOR MICROVAS BLADDER (UROLOGICAL SUPPLIES) IMPLANT
GLOVE SURG LX STRL 8.0 MICRO (GLOVE) ×2 IMPLANT
GOWN STRL REUS W/ TWL XL LVL3 (GOWN DISPOSABLE) ×2 IMPLANT
GUIDEWIRE STRT TIP .038X150X3 (WIRE) IMPLANT
IV NS IRRIG 3000ML ARTHROMATIC (IV SOLUTION) ×4 IMPLANT
KIT TURNOVER KIT A (KITS) ×2 IMPLANT
LOOP CUT BIPOLAR 24F LRG (ELECTROSURGICAL) IMPLANT
MANIFOLD NEPTUNE II (INSTRUMENTS) ×2 IMPLANT
PACK CYSTO (CUSTOM PROCEDURE TRAY) ×2 IMPLANT
SYRINGE TOOMEY IRRIG 70ML (MISCELLANEOUS) IMPLANT
TUBING CONNECTING 10 (TUBING) ×2 IMPLANT
TUBING UROLOGY SET (TUBING) ×2 IMPLANT
WATER STERILE IRR 500ML POUR (IV SOLUTION) IMPLANT

## 2024-03-18 NOTE — Anesthesia Procedure Notes (Signed)
 Procedure Name: LMA Insertion Date/Time: 03/18/2024 11:12 AM  Performed by: Para Jerelene CROME, CRNAPre-anesthesia Checklist: Patient identified, Emergency Drugs available, Suction available and Patient being monitored Patient Re-evaluated:Patient Re-evaluated prior to induction Oxygen Delivery Method: Circle system utilized Preoxygenation: Pre-oxygenation with 100% oxygen Induction Type: IV induction LMA: LMA inserted LMA Size: 4.0 Number of attempts: 1 Placement Confirmation: positive ETCO2, CO2 detector and breath sounds checked- equal and bilateral ETT to lip (cm): No markings on Ambu Aurastraight LMA size 4. LMA seated with ease. Tube secured with: Tape Dental Injury: Teeth and Oropharynx as per pre-operative assessment  Comments: Atraumatic insertion of LMA size 4. Lips and teeth remain in preoperative condition.

## 2024-03-18 NOTE — Consult Note (Signed)
 Urology Consult   Physician requesting consult: Cort Mana, MD  Reason for consult: Hematuria and foley catheter placement  History of Present Illness: Nicholas Burke is a 78 y.o. male with PMH prostate cancer s/p radiation (2014) with recurrence and partial gland cryoablation (2022 with Dr. Nieves, most recent PSA 02/25 undetectable) who presented to [facility] ED with difficulty voiding and passage of clots per urethra, which had been ongoing for 3 days.   AFVSS in the ED. Labs notable for Cr 2.06 from baseline ~1.0. Hgb 7.5 from recent baseline ~10.0. CT notable for large clot in the bladder. Patient takes baby aspirin , no other AC. Pt reports he has had intermittent hematuria since his hospitalization in September, but it has usually cleared on its own and not prevented him from voiding. When the bleeding got worse and he had trouble emptying his bladder, he decided to present to the ED.  Pt follows with Dr. Cam for prostate cancer as well as some minor stress incontinence and intermittent hematuria. Pt had previously been on finasteride  for prostate varices and hematuria but stopped it in Feb 2025 as this had improved.  I was able to scope in an 18 Fr 3-way catheter at bedside and hand irrigate the bladder with return of small volume clot. Urine initially merlot. CBI was initiated with urine light red on moderate drip.   Interval 11/22: AFVSS. Urine cherry on moderate drip CBI. Unable to flush out any clot with 17 Fr catheter in place. Plan for OR for cysto/clot evac.  Past Medical History:  Diagnosis Date   AAA (abdominal aortic aneurysm)    Hx of radiation therapy 01/02/13- 02/24/13   prostate 7800 cGy/40 sessions, seminal vesicles 5600 cGy/40 sessions   Hypertension    Pre-diabetes    Prostate cancer (HCC) 08/30/2012   gleason 6, vol 65.7 cc    Past Surgical History:  Procedure Laterality Date   CATARACT EXTRACTION, BILATERAL Bilateral    COLONOSCOPY WITH PROPOFOL  N/A 10/18/2014    Procedure: COLONOSCOPY WITH PROPOFOL ;  Surgeon: [name], MD;  Location: [facility] ENDOSCOPY;  Service: Endoscopy;  Laterality: N/A;   CRYOABLATION N/A 12/24/2020   Procedure: CRYO ABLATION PROSTATE;  Surgeon: [name], MD;  Location: [facility] ORS;  Service: Urology;  Laterality: N/A;   ESOPHAGOGASTRODUODENOSCOPY N/A 12/20/2023   Procedure: EGD (ESOPHAGOGASTRODUODENOSCOPY);  Surgeon: [name], MD;  Location: [facility] ENDOSCOPY;  Service: Gastroenterology;  Laterality: N/A;   ESOPHAGOGASTRODUODENOSCOPY N/A 12/26/2023   Procedure: EGD (ESOPHAGOGASTRODUODENOSCOPY);  Surgeon: [name], MD;  Location: [facility] ENDOSCOPY;  Service: Gastroenterology;  Laterality: N/A;   EYE SURGERY Bilateral    cataract surgery   HOT HEMOSTASIS N/A 10/18/2014   Procedure: HOT HEMOSTASIS (ARGON PLASMA COAGULATION/BICAP);  Surgeon: [name], MD;  Location: [facility];  Service: Endoscopy;  Laterality: N/A;   PROSTATE BIOPSY  08/2012   THORACIC AORTIC ENDOVASCULAR STENT GRAFT Right 02/14/2024   Procedure: INSERTION, ENDOVASCULAR STENT GRAFT, AORTA, THORACIC;  Surgeon: Lanis Fonda BRAVO, MD;  Location: [facility] OR;  Service: Vascular;  Laterality: Right;   ULTRASOUND GUIDANCE Right 02/14/2024   Procedure: ULTRASOUND GUIDANCE;  Surgeon: Lanis Fonda BRAVO, MD;  Location: [facility] OR;  Service: Vascular;  Laterality: Right;    Current Hospital Medications:  Home Meds:  No current facility-administered medications on file prior to encounter.   Current Outpatient Medications on File Prior to Encounter  Medication Sig Dispense Refill   Acetaminophen  Extra Strength 500 MG CAPS Take 500-1,000 mg by mouth See admin instructions. TYLENOL  Extra Strength Rapid Release Gels -  Take 500-1,000 mg by mouth every 8 hours as needed for pain     amLODipine  (NORVASC ) 10 MG tablet Take 1 tablet (10 mg total) by mouth daily. 90 tablet 3   aspirin  EC 81 MG tablet Take 1 tablet (81 mg total) by mouth daily at 6 (six) AM. Swallow whole.     carvedilol   (COREG ) 25 MG tablet Take 1 tablet (25 mg total) by mouth 2 (two) times daily with food. 180 tablet 1   hydrALAZINE  (APRESOLINE ) 50 MG tablet Take 50 mg by mouth 3 (three) times daily.     lisinopril  (ZESTRIL ) 20 MG tablet Take 1 tablet (20 mg total) by mouth daily. 90 tablet 1   Multiple Vitamins-Minerals (MENS 50+ MULTIVITAMIN) TABS Take 1 tablet by mouth daily with breakfast.     oxyCODONE -acetaminophen  (PERCOCET/ROXICET) 5-325 MG tablet Take 1 tablet by mouth every 6 (six) hours as needed for moderate pain (pain score 4-6). 10 tablet 0   pantoprazole  (PROTONIX ) 40 MG tablet Take 1 tablet (40 mg total) by mouth 2 (two) times daily before a meal. 60 tablet 1   polyethylene glycol powder (GLYCOLAX /MIRALAX ) 17 GM/SCOOP powder Dissolve 1 capful (17g) in 4-8 ounces of liquid and take by mouth daily. (Patient taking differently: Take 17 g by mouth daily as needed for mild constipation.) 238 g 0   rosuvastatin  (CRESTOR ) 5 MG tablet Take 5 mg by mouth every Monday, Wednesday, and Friday at 6 PM.     albuterol  (VENTOLIN  HFA) 108 (90 Base) MCG/ACT inhaler Inhale 2 puffs into the lungs every 4 (four) hours as needed for wheezing or shortness of breath. (Patient not taking: Reported on 03/17/2024) 6.7 g 0     Scheduled Meds:  aspirin  EC  81 mg Oral Q0600   carvedilol   6.25 mg Oral BID   pantoprazole   40 mg Oral BID AC   polyethylene glycol  17 g Oral Daily   rosuvastatin   5 mg Oral Q M,W,F   Continuous Infusions:  cefTRIAXone  (ROCEPHIN )  IV     sodium chloride  irrigation     PRN Meds:.acetaminophen  **OR** acetaminophen , albuterol , hydrALAZINE , HYDROmorphone  (DILAUDID ) injection, ondansetron  **OR** ondansetron  (ZOFRAN ) IV, oxyCODONE -acetaminophen   Allergies:  Allergies  Allergen Reactions   Reglan  [Metoclopramide ] Other (See Comments)    Pt states that it made him shake and had difficulty speaking    Family History  Problem Relation Age of Onset   Prostate cancer Father    Prostate cancer  Brother     Social History:  reports that he has never smoked. He has never used smokeless tobacco. He reports that he does not drink alcohol and does not use drugs.  ROS: A complete review of systems was performed.  All systems are negative except for pertinent findings as noted.  Physical Exam:  Vital signs in last 24 hours: Temp:  [98 F (36.7 C)-103 F (39.4 C)] 99.2 F (37.3 C) (11/22 0941) Pulse Rate:  [60-105] 105 (11/22 0747) Resp:  [16-20] 20 (11/22 0651) BP: (94-136)/(53-76) 131/68 (11/22 0747) SpO2:  [92 %-100 %] 95 % (11/22 0747) Weight:  [80.5 kg] 80.5 kg (11/21 2200) Constitutional:  Alert and oriented, No acute distress Cardiovascular: Regular rate and rhythm, No JVD Respiratory: Normal respiratory effort, Lungs clear bilaterally GI: Abdomen is soft, nondistended GU: Foley catheter in place draining thin red urine Lymphatic: No lymphadenopathy Neurologic: Grossly intact, no focal deficits Psychiatric: Normal mood and affect  Laboratory Data:  Recent Labs    03/17/24 1152 03/17/24 1559 03/17/24 2221  03/18/24 0020 03/18/24 0550  WBC 10.7*  --   --  12.1*  --   HGB 7.5* 7.5* 7.1* 7.3* 7.0*  HCT 25.6* 25.2* 23.7* 24.4* 24.5*  PLT 440*  --   --  353  --     Recent Labs    03/17/24 1152 03/18/24 0020  NA 136 136  K 3.7 3.6  CL 99 101  GLUCOSE 145* 124*  BUN 32* 32*  CALCIUM  9.5 9.5  CREATININE 2.06* 1.74*     Results for orders placed or performed during the hospital encounter of 03/17/24 (from the past 24 hours)  Basic metabolic panel     Status: Abnormal   Collection Time: 03/17/24 11:52 AM  Result Value Ref Range   Sodium 136 135 - 145 mmol/L   Potassium 3.7 3.5 - 5.1 mmol/L   Chloride 99 98 - 111 mmol/L   CO2 25 22 - 32 mmol/L   Glucose, Bld 145 (H) 70 - 99 mg/dL   BUN 32 (H) 8 - 23 mg/dL   Creatinine, Ser 7.93 (H) 0.61 - 1.24 mg/dL   Calcium  9.5 8.9 - 10.3 mg/dL   GFR, Estimated 33 (L) >60 mL/min   Anion gap 12 5 - 15  CBC      Status: Abnormal   Collection Time: 03/17/24 11:52 AM  Result Value Ref Range   WBC 10.7 (H) 4.0 - 10.5 K/uL   RBC 3.42 (L) 4.22 - 5.81 MIL/uL   Hemoglobin 7.5 (L) 13.0 - 17.0 g/dL   HCT 74.3 (L) 60.9 - 47.9 %   MCV 74.9 (L) 80.0 - 100.0 fL   MCH 21.9 (L) 26.0 - 34.0 pg   MCHC 29.3 (L) 30.0 - 36.0 g/dL   RDW 80.7 (H) 88.4 - 84.4 %   Platelets 440 (H) 150 - 400 K/uL   nRBC 0.0 0.0 - 0.2 %  Urinalysis, w/ Reflex to Culture (Infection Suspected) -Urine, Clean Catch     Status: Abnormal   Collection Time: 03/17/24  2:37 PM  Result Value Ref Range   Specimen Source URINE, CLEAN CATCH    Color, Urine (A) YELLOW    TEST NOT REPORTED DUE TO COLOR INTERFERENCE OF URINE PIGMENT   APPearance (A) CLEAR    TEST NOT REPORTED DUE TO COLOR INTERFERENCE OF URINE PIGMENT   Specific Gravity, Urine  1.005 - 1.030    TEST NOT REPORTED DUE TO COLOR INTERFERENCE OF URINE PIGMENT   pH  5.0 - 8.0    TEST NOT REPORTED DUE TO COLOR INTERFERENCE OF URINE PIGMENT   Glucose, UA (A) NEGATIVE mg/dL    TEST NOT REPORTED DUE TO COLOR INTERFERENCE OF URINE PIGMENT   Hgb urine dipstick (A) NEGATIVE    TEST NOT REPORTED DUE TO COLOR INTERFERENCE OF URINE PIGMENT   Bilirubin Urine (A) NEGATIVE    TEST NOT REPORTED DUE TO COLOR INTERFERENCE OF URINE PIGMENT   Ketones, ur (A) NEGATIVE mg/dL    TEST NOT REPORTED DUE TO COLOR INTERFERENCE OF URINE PIGMENT   Protein, ur (A) NEGATIVE mg/dL    TEST NOT REPORTED DUE TO COLOR INTERFERENCE OF URINE PIGMENT   Nitrite (A) NEGATIVE    TEST NOT REPORTED DUE TO COLOR INTERFERENCE OF URINE PIGMENT   Leukocytes,Ua (A) NEGATIVE    TEST NOT REPORTED DUE TO COLOR INTERFERENCE OF URINE PIGMENT   RBC / HPF 0-5 0 - 5 RBC/hpf   WBC, UA 0-5 0 - 5 WBC/hpf   Bacteria, UA NONE SEEN NONE  SEEN   Squamous Epithelial / HPF 0-5 0 - 5 /HPF  Hemoglobin and hematocrit, blood     Status: Abnormal   Collection Time: 03/17/24  3:59 PM  Result Value Ref Range   Hemoglobin 7.5 (L) 13.0 - 17.0 g/dL    HCT 74.7 (L) 60.9 - 52.0 %  Iron and TIBC     Status: Abnormal   Collection Time: 03/17/24 10:21 PM  Result Value Ref Range   Iron 20 (L) 45 - 182 ug/dL   TIBC 811 (L) 749 - 549 ug/dL   Saturation Ratios 11 (L) 17.9 - 39.5 %   UIBC 168 ug/dL  Ferritin     Status: Abnormal   Collection Time: 03/17/24 10:21 PM  Result Value Ref Range   Ferritin 573 (H) 24 - 336 ng/mL  Reticulocytes     Status: Abnormal   Collection Time: 03/17/24 10:21 PM  Result Value Ref Range   Retic Ct Pct 0.9 0.4 - 3.1 %   RBC. 3.17 (L) 4.22 - 5.81 MIL/uL   Retic Count, Absolute 28.5 19.0 - 186.0 K/uL   Immature Retic Fract 23.5 (H) 2.3 - 15.9 %  Hemoglobin and hematocrit, blood     Status: Abnormal   Collection Time: 03/17/24 10:21 PM  Result Value Ref Range   Hemoglobin 7.1 (L) 13.0 - 17.0 g/dL   HCT 76.2 (L) 60.9 - 47.9 %  CBC     Status: Abnormal   Collection Time: 03/18/24 12:20 AM  Result Value Ref Range   WBC 12.1 (H) 4.0 - 10.5 K/uL   RBC 3.22 (L) 4.22 - 5.81 MIL/uL   Hemoglobin 7.3 (L) 13.0 - 17.0 g/dL   HCT 75.5 (L) 60.9 - 47.9 %   MCV 75.8 (L) 80.0 - 100.0 fL   MCH 22.7 (L) 26.0 - 34.0 pg   MCHC 29.9 (L) 30.0 - 36.0 g/dL   RDW 80.7 (H) 88.4 - 84.4 %   Platelets 353 150 - 400 K/uL   nRBC 0.0 0.0 - 0.2 %  Basic metabolic panel     Status: Abnormal   Collection Time: 03/18/24 12:20 AM  Result Value Ref Range   Sodium 136 135 - 145 mmol/L   Potassium 3.6 3.5 - 5.1 mmol/L   Chloride 101 98 - 111 mmol/L   CO2 22 22 - 32 mmol/L   Glucose, Bld 124 (H) 70 - 99 mg/dL   BUN 32 (H) 8 - 23 mg/dL   Creatinine, Ser 8.25 (H) 0.61 - 1.24 mg/dL   Calcium  9.5 8.9 - 10.3 mg/dL   GFR, Estimated 40 (L) >60 mL/min   Anion gap 13 5 - 15  Hemoglobin and hematocrit, blood     Status: Abnormal   Collection Time: 03/18/24  5:50 AM  Result Value Ref Range   Hemoglobin 7.0 (L) 13.0 - 17.0 g/dL   HCT 75.4 (L) 60.9 - 47.9 %   No results found for this or any previous visit (from the past 240 hours).  Renal  Function: Recent Labs    03/17/24 1152 03/18/24 0020  CREATININE 2.06* 1.74*   Estimated Creatinine Clearance: 36.7 mL/min (A) (by C-G formula based on SCr of 1.74 mg/dL (H)).  Radiologic Imaging: CT Angio Chest/Abd/Pel for Dissection W and/or W/WO Result Date: 03/17/2024 CLINICAL DATA:  Acute aortic syndrome suspected. Blood in urine. Dysuria. EXAM: CT ANGIOGRAPHY CHEST, ABDOMEN AND PELVIS TECHNIQUE: Noncontrast chest CT was performed. Multidetector CT imaging through the chest, abdomen and pelvis was performed using the standard  protocol during bolus administration of intravenous contrast. Multiplanar reconstructed images and MIPs were obtained and reviewed to evaluate the vascular anatomy. RADIATION DOSE REDUCTION: This exam was performed according to the departmental dose-optimization program which includes automated exposure control, adjustment of the mA and/or kV according to patient size and/or use of iterative reconstruction technique. CONTRAST:  80mL OMNIPAQUE  IOHEXOL  350 MG/ML SOLN COMPARISON:  CT chest abdomen and pelvis 12/20/2023 FINDINGS: CTA CHEST FINDINGS Cardiovascular: There is a new descending thoracic aortic graft in place which appears widely patent. Intramural hematoma throughout the descending thoracic aorta again seen. The descending thoracic aorta is dilated measuring 5.4 cm. There is an endoleak along the proximal margin of the endograft with contrast filling the intramural hematoma. Origin of great vessels within normal limits. The heart is normal in size.  There is no pericardial effusion. Mediastinum/Nodes: No enlarged mediastinal, hilar, or axillary lymph nodes. Thyroid gland, trachea, and esophagus demonstrate no significant findings. There are numerous nonenlarged mediastinal and hilar lymph nodes. Lungs/Pleura: There is a mild patchy ground-glass opacities in both lower lobes, left greater than right. Left pleural effusion has resolved. There is no focal lung  consolidation or pneumothorax. Musculoskeletal: Degenerative changes affect the spine. Review of the MIP images confirms the above findings. CTA ABDOMEN AND PELVIS FINDINGS VASCULAR Aorta: There is aneurysmal dilatation of the proximal abdominal aorta at the level of the diaphragm measuring 2.7 x 3.8 cm similar to the prior study. Again seen is a small amount of contrast extravasation into intramural hematoma along the posterior left aspect of the aorta, slightly increased from prior examination. Infrarenal abdominal aortic aneurysm measures 3.0 cm and is unchanged in size. Focal dissection or region leakage of contrast into the intramural hematoma has increased at this level. Celiac: Patent without evidence of aneurysm, dissection, vasculitis or significant stenosis. SMA: Patent without evidence of aneurysm, dissection, vasculitis or significant stenosis. Renals: Both renal arteries are patent without evidence of aneurysm, dissection, vasculitis, fibromuscular dysplasia or significant stenosis. IMA: Patent without evidence of aneurysm, dissection, vasculitis or significant stenosis. Inflow: Patent without evidence of aneurysm, dissection, vasculitis or significant stenosis. Veins: No obvious venous abnormality within the limitations of this arterial phase study. Review of the MIP images confirms the above findings. NON-VASCULAR Hepatobiliary: Gallstones are present. There is no biliary ductal dilatation. The liver is within normal limits. Pancreas: Unremarkable. No pancreatic ductal dilatation or surrounding inflammatory changes. Spleen: Normal in size without focal abnormality. Adrenals/Urinary Tract: There is heterogeneous hyperdensity filling the majority of the bladder with hyperdense fluid fluid level favored as hematoma. There is mild stranding surrounding the bladder. There is a subcentimeter hypodensity in the superior pole the right kidney which is unchanged, likely a cyst. Otherwise, the kidneys and  adrenal glands are within normal limits. Stomach/Bowel: Stomach is within normal limits. Appendix appears normal. No evidence of bowel wall thickening, distention, or inflammatory changes. There is sigmoid colon diverticulosis. There some borderline dilated loops of jejunum with air-fluid levels in the upper abdomen without definite transition. Lymphatic: No enlarged lymph nodes are identified. Reproductive: Prostate radiotherapy seeds are present. Other: Small left and moderate right fat containing inguinal hernias are present. There is a small fat containing umbilical hernia. There is no ascites. Musculoskeletal: No acute or significant osseous findings. Review of the MIP images confirms the above findings. IMPRESSION: Vascular 1. New descending thoracic aortic graft in place. There is an endoleak along the proximal margin of the endograft with contrast filling the intramural hematoma. 2. Stable aneurysmal dilatation of  the proximal abdominal aorta at the level of the diaphragm measuring 3.8 cm. 3. Stable infrarenal abdominal aortic aneurysm measuring 3.0 cm. Focal dissection or region of leakage of contrast into the intramural hematoma has increased at this level. Nonvascular 1. Heterogeneous hyperdensity filling the majority of the bladder with hyperdense fluid fluid level favored as hematoma. There is mild stranding surrounding the bladder. Underlying bladder mass is not excluded. 2. Mild patchy ground-glass opacities in both lower lobes, left greater than right, likely infectious/inflammatory. 3. Cholelithiasis. 4. Borderline dilated loops of jejunum with air-fluid levels in the upper abdomen without definite transition point. Findings may represent ileus. Aortic Atherosclerosis (ICD10-I70.0). Electronically Signed   By: Greig Pique M.D.   On: 03/17/2024 15:43    I independently reviewed the above imaging studies.  Impression/Recommendation Pt is a 78 yo man with PMH AAA with recent type B aortic  dissection and intramural aortic hematoma status post endovascular repair September - October 2025, HTN, CKD stage IV, prediabetes, prostate cancer status post radiation therapy in 2014, with recurrence and right Hemi cryoablation prostate 2022 (most recent PSA in 2/25 undetectable) who presented to [facility] ED in clot retention.   AFVSS in the ED. Labs notable for Cr 2.06 from baseline ~1.0. Hgb 7.5 from recent baseline ~10.0. UA inconclusive due to color interference but patient denies infectious symptoms. CT notable for large clot in the bladder. Patient takes baby aspirin , no other AC.   Pt is currently stable with 3-way catheter in place on CBI. Unfortunately, the clot in patient's bladder appeared quite well-organized and may be challenging to clear with hand irrigation alone.   - Continue CBI, titrate to thin pink urine  - If catheter stops draining, immediately clamp CBI in-flow, detach catheter from bag, and flush catheter port with 120 cc sterile water  using a catheter tip syringe (importance of this was stressed to patient and nursing)  - If unable to restore patency, page Urology - NPO for OR today for cysto/clot evac - Urology will continue to follow   Maurilio Agar 03/18/2024, 9:56 AM

## 2024-03-18 NOTE — Addendum Note (Signed)
 Addendum  created 03/18/24 1508 by Para Jerelene CROME, CRNA   Clinical Note Signed, Flowsheet accepted, Intraprocedure Flowsheets edited

## 2024-03-18 NOTE — Anesthesia Postprocedure Evaluation (Signed)
 Anesthesia Post Note  Patient: Nicholas Burke  Procedure(s) Performed: CYSTOSCOPY, WITH BLADDER FULGURATION (Bladder)     Patient location during evaluation: PACU Anesthesia Type: General Level of consciousness: awake and alert Pain management: pain level controlled Vital Signs Assessment: post-procedure vital signs reviewed and stable Respiratory status: spontaneous breathing, nonlabored ventilation, respiratory function stable and patient connected to nasal cannula oxygen Cardiovascular status: blood pressure returned to baseline and stable Postop Assessment: no apparent nausea or vomiting Anesthetic complications: no   No notable events documented.  Last Vitals:  Vitals:   03/18/24 1246 03/18/24 1309  BP: (!) 103/56 (!) 102/54  Pulse: 98 99  Resp: (!) 25 20  Temp:  37.5 C  SpO2: 93% 93%    Last Pain:  Vitals:   03/18/24 1309  TempSrc: Oral  PainSc:                  Sharise Lippy E

## 2024-03-18 NOTE — Anesthesia Preprocedure Evaluation (Signed)
 Anesthesia Evaluation  Patient identified by MRN, date of birth, ID band Patient awake    Reviewed: Allergy & Precautions, NPO status , Patient's Chart, lab work & pertinent test results  Airway Mallampati: II  TM Distance: >3 FB Neck ROM: Full    Dental   Pulmonary neg pulmonary ROS   breath sounds clear to auscultation       Cardiovascular hypertension, Pt. on medications + Peripheral Vascular Disease (s/p EVAR)   Rhythm:Regular Rate:Normal     Neuro/Psych negative neurological ROS     GI/Hepatic negative GI ROS, Neg liver ROS,,,  Endo/Other  diabetes    Renal/GU negative Renal ROS     Musculoskeletal   Abdominal   Peds  Hematology  (+) Blood dyscrasia, anemia   Anesthesia Other Findings   Reproductive/Obstetrics                              Anesthesia Physical Anesthesia Plan  ASA: 3  Anesthesia Plan: General   Post-op Pain Management: Tylenol  PO (pre-op)*   Induction: Intravenous  PONV Risk Score and Plan: 2 and Dexamethasone , Treatment may vary due to age or medical condition and Ondansetron   Airway Management Planned: LMA  Additional Equipment:   Intra-op Plan:   Post-operative Plan: Extubation in OR  Informed Consent: I have reviewed the patients History and Physical, chart, labs and discussed the procedure including the risks, benefits and alternatives for the proposed anesthesia with the patient or authorized representative who has indicated his/her understanding and acceptance.     Dental advisory given  Plan Discussed with: CRNA  Anesthesia Plan Comments: (  )         Anesthesia Quick Evaluation

## 2024-03-18 NOTE — Transfer of Care (Addendum)
 Immediate Anesthesia Transfer of Care Note  Patient: Nicholas Burke  Procedure(s) Performed: CYSTOSCOPY, WITH BLADDER FULGURATION (Bladder)  Patient Location: PACU  Anesthesia Type:General  Level of Consciousness: drowsy and patient cooperative  Airway & Oxygen Therapy: Patient Spontanous Breathing and Patient connected to face mask oxygen  Post-op Assessment: Report given to RN and Post -op Vital signs reviewed and stable  Post vital signs: Reviewed and stable  Last Vitals:  Vitals Value Taken Time  BP 106/64 03/18/24 12:15  Temp 37.5 03/18/24   12:15  Pulse 99 03/18/24 12:19  Resp 28 03/18/24 12:19  SpO2 90 % 03/18/24 12:19  Vitals shown include unfiled device data.  Last Pain:  Vitals:   03/18/24 0946  TempSrc:   PainSc: 10-Worst pain ever         Complications: No notable events documented.

## 2024-03-18 NOTE — Op Note (Signed)
.  Preoperative diagnosis: bladder tumor  Postoperative diagnosis: Same  Procedure: 1 cystoscopy 2. Clot Evacuation 3. Fulageration of bladder wall  Attending: Belvie Clara, MD  Resident: Maurilio Agar, MD  Anesthesia: General  Estimated blood loss: Minimal  Drains: 24 french French foley  Specimens: none  Antibiotics: rocephin   Findings: multiple areas of irritation from foley catheter. 300cc clot in the bladder. Active bleeding from posterior wall of bladder.  Ureteral orifices in normal anatomic location.   Indications: Patient is a 78 year old male with a history of gross hematuria and clot retention.  After discussing treatment options, they decided proceed with cystoscopy and clot evauation  Procedure in detail: The patient was brought to the operating room and a brief timeout was done to ensure correct patient, correct procedure, correct site.  General anesthesia was administered patient was placed in dorsal lithotomy position.  Their genitalia was then prepped and draped in usual sterile fashion.  A rigid 22 French cystoscope was passed in the urethra and the bladder.  Bladder was inspected and we noted a large amount of clot and a bleeding from posterior wall of the the bladder.  the ureteral orifices were in the normal orthotopic locations.  We then removed the cystoscope and placed a resectoscope into the bladder. We proceeded to remove the large clot burden from the bladder.. Using the bipolar resectoscope we cauterized the posterior wall of the bladder.  the bladder was then drained, a 24 French foley was placed and this concluded the procedure which was well tolerated by patient.  Complications: None  Condition: Stable, extubated, transferred to PACU  Plan: Patient is admitted overnight with continuous bladder irrigation. If their urine is clear tomorrow they will be discharged home and followup in 2 weeks

## 2024-03-18 NOTE — Progress Notes (Signed)
 PROGRESS NOTE    Nicholas Burke  FMW:[id number] DOB: 02/13/1946 DOA: 03/17/2024 PCP: Auston Opal, DO  Outpatient Specialists:     Brief Narrative:  As per H&P done on admission: Nicholas Burke is a 78 y.o. male with medical history significant of AAA with recent type B aortic dissection and intramural aortic hematoma status post endovascular repair September - October 2025, HTN, CKD stage IV, prediabetes, prostate cancer status pos radiation therapy in 2014, with recurrence and right Hemi cryoablation prostate 2022, presented with worsening lower abdominal pain and gross hematuria.   Patient has a known AAA was diagnosed with type B dissection and intramural aortic hematoma for which he underwent endovascular repair on October 20.  He was discharged home on October 22 and since then patient has had intermittent hematuria, usually cleared by self within 24 hours.  Last few weeks, he started having more frequent/hematuria episode associated with lower abdomen cramping like pain.  Denies any fever or chills.  2 days ago, he started to have another episode but this time the hematuria has become more severe, he also developed a incontinence with heavy blood staining on his underwear.   ED Course: Afebrile, blood pressure 102/60 O2 saturation 100% on room air.  CT abdomen pelvis showed no descending thoracic aortic graft with endoleak along the proximal margin.  Heterogeneous hyperdensity feeling in the majority of the bladder with hyperdense fluid fluid favored as hematoma.  Underlying breast mass cannot be ruled out.  Blood work showed WBC 10.7 hemoglobin 7.5 compared to baseline 8.8-10.0 from last months.  Creatinine 2.0 compared to baseline 1.4-1.5.  03/18/2024: Patient seen alongside patient's daughter.  Patient was admitted with frank hematuria.  Patient underwent cystoscopy, clot evacuation and fulguration of the bladder wall, with postoperative diagnosis of bladder tumor.  Input from the urology team  is highly appreciated.  Three-way Foley catheter is in place.  Hemoglobin dropped from 7 g/dL to 5.9 g/dL.  Will transfuse 2 units of packed red blood cells (IV Lasix  20 mg before each unit of packed red blood cell).  CT angiography of chest, abdomen and pelvis revealed: Vascular   1. New descending thoracic aortic graft in place. There is an endoleak along the proximal margin of the endograft with contrast filling the intramural hematoma. 2. Stable aneurysmal dilatation of the proximal abdominal aorta at the level of the diaphragm measuring 3.8 cm. 3. Stable infrarenal abdominal aortic aneurysm measuring 3.0 cm. Focal dissection or region of leakage of contrast into the intramural hematoma has increased at this level.   Nonvascular   1. Heterogeneous hyperdensity filling the majority of the bladder with hyperdense fluid fluid level favored as hematoma. There is mild stranding surrounding the bladder. Underlying bladder mass is not excluded. 2. Mild patchy ground-glass opacities in both lower lobes, left greater than right, likely infectious/inflammatory. 3. Cholelithiasis. 4. Borderline dilated loops of jejunum with air-fluid levels in the upper abdomen without definite transition point. Findings may represent ileus.   Aortic Atherosclerosis (ICD10-I70.0).     Assessment & Plan:   Principal Problem:   Gross hematuria Active Problems:   Symptomatic anemia   Acute/subacute gross hematuria: Acute blood loss anemia: - History of recurrent prostate cancer 3 years ago - CT angiography chest, abdomen and pelvis is as documented above. - Patient underwent cystoscopy and clot evacuation today.   - Three-way Foley catheter in situ.   - Hemoglobin has dropped to 5.9 g/dL.   - Transfused 2 units of packed red blood cells  -  Follow H/H.   - Urology input is highly appreciated.     AAA: -Recent descending aortic dissection and intramural hematoma status post endovascular  repair - CT angiography chest, abdomen and pelvis is as documented above. - CT images were reviewed by vascular surgeon who recommended no emergency surgery indicated.   Hypotension: - Multifactorial.   - Patient has been bleeding. - Patient was on several antihypertensives - Blood pressure closely.     AKI on CKD stage 3A: - Baseline serum creatinine of 1.13-1.42  - Admitted with serum creatinine of 2.06. - Serum creatinine of 1.74 today. - Avoid hypotension. - Keep MAP greater than 65 mmHg. - Avoid nephrotoxins. - Transfuse packed red blood cells to keep hemoglobin above 7 g/dL. -Continue to monitor renal function and electrolytes.     Type 2 diabetes mellitus:  - Most recent A1c 6.3 - Continue diet control.   Unintentional weight loss: -Anorexic -25-30 pounds of weight loss in last 4-5 months. - Low threshold to consider caloric count and dietary consult. - Cannot rule out recurrent malignancy?    DVT prophylaxis: SCD. Code Status: Full code Family Communication: Daughter by bedside Disposition Plan: Inpatient   Consultants:  Urology  Procedures:  Cystoscopy, evacuation, and fulguration of the bladder wall.  Antimicrobials:  IV Rocephin .   Subjective: No new complaints  Objective: Vitals:   03/18/24 0651 03/18/24 0747 03/18/24 0941 03/18/24 1000  BP: 136/74 131/68  (!) 113/58  Pulse: (!) 105 (!) 105  (!) 105  Resp: 20   (!) 30  Temp: (!) 100.6 F (38.1 C) (!) 103 F (39.4 C) 99.2 F (37.3 C) (!) 101.4 F (38.6 C)  TempSrc: Oral Oral Oral   SpO2: 92% 95%  91%  Weight:      Height:        Intake/Output Summary (Last 24 hours) at 03/18/2024 1154 Last data filed at 03/18/2024 1136 Gross per 24 hour  Intake 600 ml  Output 89224 ml  Net -10175 ml   Filed Weights   03/17/24 2200  Weight: 80.5 kg    Examination:  General exam: Appears calm and comfortable  Respiratory system: Clear to auscultation. Respiratory effort normal. Cardiovascular  system: S1 & S2 heard Gastrointestinal system: Abdomen is soft and nontender.  Central nervous system: Alert and oriented.    Data Reviewed: I have personally reviewed following labs and imaging studies  CBC: Recent Labs  Lab 03/17/24 1152 03/17/24 1559 03/17/24 2221 03/18/24 0020 03/18/24 0550  WBC 10.7*  --   --  12.1*  --   HGB 7.5* 7.5* 7.1* 7.3* 7.0*  HCT 25.6* 25.2* 23.7* 24.4* 24.5*  MCV 74.9*  --   --  75.8*  --   PLT 440*  --   --  353  --    Basic Metabolic Panel: Recent Labs  Lab 03/17/24 1152 03/18/24 0020  NA 136 136  K 3.7 3.6  CL 99 101  CO2 25 22  GLUCOSE 145* 124*  BUN 32* 32*  CREATININE 2.06* 1.74*  CALCIUM  9.5 9.5   GFR: Estimated Creatinine Clearance: 36.7 mL/min (A) (by C-G formula based on SCr of 1.74 mg/dL (H)). Liver Function Tests: No results for input(s): AST, ALT, ALKPHOS, BILITOT, PROT, ALBUMIN  in the last 168 hours. No results for input(s): LIPASE, AMYLASE in the last 168 hours. No results for input(s): AMMONIA in the last 168 hours. Coagulation Profile: No results for input(s): INR, PROTIME in the last 168 hours. Cardiac Enzymes: No results for  input(s): CKTOTAL, CKMB, CKMBINDEX, TROPONINI in the last 168 hours. BNP (last 3 results) No results for input(s): PROBNP in the last 8760 hours. HbA1C: No results for input(s): HGBA1C in the last 72 hours. CBG: No results for input(s): GLUCAP in the last 168 hours. Lipid Profile: No results for input(s): CHOL, HDL, LDLCALC, TRIG, CHOLHDL, LDLDIRECT in the last 72 hours. Thyroid Function Tests: No results for input(s): TSH, T4TOTAL, FREET4, T3FREE, THYROIDAB in the last 72 hours. Anemia Panel: Recent Labs    03/17/24 2221  FERRITIN 573*  TIBC 188*  IRON 20*  RETICCTPCT 0.9   Urine analysis:    Component Value Date/Time   COLORURINE (A) 03/17/2024 1437    TEST NOT REPORTED DUE TO COLOR INTERFERENCE OF URINE PIGMENT    APPEARANCEUR (A) 03/17/2024 1437    TEST NOT REPORTED DUE TO COLOR INTERFERENCE OF URINE PIGMENT   LABSPEC  03/17/2024 1437    TEST NOT REPORTED DUE TO COLOR INTERFERENCE OF URINE PIGMENT   LABSPEC 1.005 01/16/2013 0953   PHURINE  03/17/2024 1437    TEST NOT REPORTED DUE TO COLOR INTERFERENCE OF URINE PIGMENT   GLUCOSEU (A) 03/17/2024 1437    TEST NOT REPORTED DUE TO COLOR INTERFERENCE OF URINE PIGMENT   GLUCOSEU Negative 01/16/2013 0953   HGBUR (A) 03/17/2024 1437    TEST NOT REPORTED DUE TO COLOR INTERFERENCE OF URINE PIGMENT   BILIRUBINUR (A) 03/17/2024 1437    TEST NOT REPORTED DUE TO COLOR INTERFERENCE OF URINE PIGMENT   BILIRUBINUR Negative 01/16/2013 0953   KETONESUR (A) 03/17/2024 1437    TEST NOT REPORTED DUE TO COLOR INTERFERENCE OF URINE PIGMENT   PROTEINUR (A) 03/17/2024 1437    TEST NOT REPORTED DUE TO COLOR INTERFERENCE OF URINE PIGMENT   UROBILINOGEN 0.2 01/16/2013 0953   NITRITE (A) 03/17/2024 1437    TEST NOT REPORTED DUE TO COLOR INTERFERENCE OF URINE PIGMENT   LEUKOCYTESUR (A) 03/17/2024 1437    TEST NOT REPORTED DUE TO COLOR INTERFERENCE OF URINE PIGMENT   LEUKOCYTESUR Trace 01/16/2013 0953   Sepsis Labs: @LABRCNTIP (procalcitonin:4,lacticidven:4)  )No results found for this or any previous visit (from the past 240 hours).       Radiology Studies: CT Angio Chest/Abd/Pel for Dissection W and/or W/WO Result Date: 03/17/2024 CLINICAL DATA:  Acute aortic syndrome suspected. Blood in urine. Dysuria. EXAM: CT ANGIOGRAPHY CHEST, ABDOMEN AND PELVIS TECHNIQUE: Noncontrast chest CT was performed. Multidetector CT imaging through the chest, abdomen and pelvis was performed using the standard protocol during bolus administration of intravenous contrast. Multiplanar reconstructed images and MIPs were obtained and reviewed to evaluate the vascular anatomy. RADIATION DOSE REDUCTION: This exam was performed according to the departmental dose-optimization program which  includes automated exposure control, adjustment of the mA and/or kV according to patient size and/or use of iterative reconstruction technique. CONTRAST:  80mL OMNIPAQUE  IOHEXOL  350 MG/ML SOLN COMPARISON:  CT chest abdomen and pelvis 12/20/2023 FINDINGS: CTA CHEST FINDINGS Cardiovascular: There is a new descending thoracic aortic graft in place which appears widely patent. Intramural hematoma throughout the descending thoracic aorta again seen. The descending thoracic aorta is dilated measuring 5.4 cm. There is an endoleak along the proximal margin of the endograft with contrast filling the intramural hematoma. Origin of great vessels within normal limits. The heart is normal in size.  There is no pericardial effusion. Mediastinum/Nodes: No enlarged mediastinal, hilar, or axillary lymph nodes. Thyroid gland, trachea, and esophagus demonstrate no significant findings. There are numerous nonenlarged mediastinal and hilar lymph nodes.  Lungs/Pleura: There is a mild patchy ground-glass opacities in both lower lobes, left greater than right. Left pleural effusion has resolved. There is no focal lung consolidation or pneumothorax. Musculoskeletal: Degenerative changes affect the spine. Review of the MIP images confirms the above findings. CTA ABDOMEN AND PELVIS FINDINGS VASCULAR Aorta: There is aneurysmal dilatation of the proximal abdominal aorta at the level of the diaphragm measuring 2.7 x 3.8 cm similar to the prior study. Again seen is a small amount of contrast extravasation into intramural hematoma along the posterior left aspect of the aorta, slightly increased from prior examination. Infrarenal abdominal aortic aneurysm measures 3.0 cm and is unchanged in size. Focal dissection or region leakage of contrast into the intramural hematoma has increased at this level. Celiac: Patent without evidence of aneurysm, dissection, vasculitis or significant stenosis. SMA: Patent without evidence of aneurysm, dissection,  vasculitis or significant stenosis. Renals: Both renal arteries are patent without evidence of aneurysm, dissection, vasculitis, fibromuscular dysplasia or significant stenosis. IMA: Patent without evidence of aneurysm, dissection, vasculitis or significant stenosis. Inflow: Patent without evidence of aneurysm, dissection, vasculitis or significant stenosis. Veins: No obvious venous abnormality within the limitations of this arterial phase study. Review of the MIP images confirms the above findings. NON-VASCULAR Hepatobiliary: Gallstones are present. There is no biliary ductal dilatation. The liver is within normal limits. Pancreas: Unremarkable. No pancreatic ductal dilatation or surrounding inflammatory changes. Spleen: Normal in size without focal abnormality. Adrenals/Urinary Tract: There is heterogeneous hyperdensity filling the majority of the bladder with hyperdense fluid fluid level favored as hematoma. There is mild stranding surrounding the bladder. There is a subcentimeter hypodensity in the superior pole the right kidney which is unchanged, likely a cyst. Otherwise, the kidneys and adrenal glands are within normal limits. Stomach/Bowel: Stomach is within normal limits. Appendix appears normal. No evidence of bowel wall thickening, distention, or inflammatory changes. There is sigmoid colon diverticulosis. There some borderline dilated loops of jejunum with air-fluid levels in the upper abdomen without definite transition. Lymphatic: No enlarged lymph nodes are identified. Reproductive: Prostate radiotherapy seeds are present. Other: Small left and moderate right fat containing inguinal hernias are present. There is a small fat containing umbilical hernia. There is no ascites. Musculoskeletal: No acute or significant osseous findings. Review of the MIP images confirms the above findings. IMPRESSION: Vascular 1. New descending thoracic aortic graft in place. There is an endoleak along the proximal margin  of the endograft with contrast filling the intramural hematoma. 2. Stable aneurysmal dilatation of the proximal abdominal aorta at the level of the diaphragm measuring 3.8 cm. 3. Stable infrarenal abdominal aortic aneurysm measuring 3.0 cm. Focal dissection or region of leakage of contrast into the intramural hematoma has increased at this level. Nonvascular 1. Heterogeneous hyperdensity filling the majority of the bladder with hyperdense fluid fluid level favored as hematoma. There is mild stranding surrounding the bladder. Underlying bladder mass is not excluded. 2. Mild patchy ground-glass opacities in both lower lobes, left greater than right, likely infectious/inflammatory. 3. Cholelithiasis. 4. Borderline dilated loops of jejunum with air-fluid levels in the upper abdomen without definite transition point. Findings may represent ileus. Aortic Atherosclerosis (ICD10-I70.0). Electronically Signed   By: Greig Pique M.D.   On: 03/17/2024 15:43        Scheduled Meds:  [MAR Hold] aspirin  EC  81 mg Oral Q0600   [MAR Hold] carvedilol   6.25 mg Oral BID   [MAR Hold] pantoprazole   40 mg Oral BID AC   [MAR Hold] polyethylene glycol  17 g Oral Daily   [MAR Hold] rosuvastatin   5 mg Oral Q M,W,F   Continuous Infusions:  sodium chloride      [MAR Hold] cefTRIAXone  (ROCEPHIN )  IV     promethazine  (PHENERGAN ) injection (IM or IVPB)     sodium chloride  irrigation       LOS: 1 day    Time spent: 55 minutes    Leatrice Chapel, MD  [facility] Pager #: [phone] 7PM-7AM contact night coverage as above

## 2024-03-19 ENCOUNTER — Encounter (HOSPITAL_COMMUNITY): Payer: Self-pay | Admitting: Urology

## 2024-03-19 DIAGNOSIS — R310 Gross hematuria: Secondary | ICD-10-CM | POA: Diagnosis not present

## 2024-03-19 DIAGNOSIS — R338 Other retention of urine: Secondary | ICD-10-CM | POA: Diagnosis not present

## 2024-03-19 DIAGNOSIS — N3289 Other specified disorders of bladder: Secondary | ICD-10-CM | POA: Diagnosis not present

## 2024-03-19 LAB — HEMOGLOBIN AND HEMATOCRIT, BLOOD
HCT: 23.5 % — ABNORMAL LOW (ref 39.0–52.0)
Hemoglobin: 7.5 g/dL — ABNORMAL LOW (ref 13.0–17.0)

## 2024-03-19 MED ORDER — CHLORHEXIDINE GLUCONATE CLOTH 2 % EX PADS
6.0000 | MEDICATED_PAD | Freq: Every day | CUTANEOUS | Status: DC
  Administered 2024-03-20: 6 via TOPICAL

## 2024-03-19 MED ORDER — ENSURE PLUS HIGH PROTEIN PO LIQD: 237.0000 mL | Freq: Two times a day (BID) | ORAL | Status: DC

## 2024-03-19 NOTE — Progress Notes (Signed)
 PROGRESS NOTE    Nicholas Burke  FMW:[id number] DOB: [birth year] DOA: 03/17/2024 PCP: Auston Opal, DO  Outpatient Specialists:     Brief Narrative:  As per H&P done on admission: Nicholas Burke is a 78 y.o. male with medical history significant of AAA with recent type B aortic dissection and intramural aortic hematoma status post endovascular repair September - October 2025, HTN, CKD stage IV, prediabetes, prostate cancer status pos radiation therapy in 2014, with recurrence and right Hemi cryoablation prostate 2022, presented with worsening lower abdominal pain and gross hematuria.   Patient has a known AAA was diagnosed with type B dissection and intramural aortic hematoma for which he underwent endovascular repair on October 20.  He was discharged home on October 22 and since then patient has had intermittent hematuria, usually cleared by self within 24 hours.  Last few weeks, he started having more frequent/hematuria episode associated with lower abdomen cramping like pain.  Denies any fever or chills.  2 days ago, he started to have another episode but this time the hematuria has become more severe, he also developed a incontinence with heavy blood staining on his underwear.   ED Course: Afebrile, blood pressure 102/60 O2 saturation 100% on room air.  CT abdomen pelvis showed no descending thoracic aortic graft with endoleak along the proximal margin.  Heterogeneous hyperdensity feeling in the majority of the bladder with hyperdense fluid fluid favored as hematoma.  Underlying breast mass cannot be ruled out.  Blood work showed WBC 10.7 hemoglobin 7.5 compared to baseline 8.8-10.0 from last months.  Creatinine 2.0 compared to baseline 1.4-1.5.  03/18/2024: Patient seen alongside patient's daughter.  Patient was admitted with frank hematuria.  Patient underwent cystoscopy, clot evacuation and fulguration of the bladder wall, with postoperative diagnosis of bladder tumor.  Input from the urology team  is highly appreciated.  Three-way Foley catheter is in place.  Hemoglobin dropped from 7 g/dL to 5.9 g/dL.  Will transfuse 2 units of packed red blood cells (IV Lasix  20 mg before each unit of packed red blood cell).  CT angiography of chest, abdomen and pelvis revealed: Vascular   1. New descending thoracic aortic graft in place. There is an endoleak along the proximal margin of the endograft with contrast filling the intramural hematoma. 2. Stable aneurysmal dilatation of the proximal abdominal aorta at the level of the diaphragm measuring 3.8 cm. 3. Stable infrarenal abdominal aortic aneurysm measuring 3.0 cm. Focal dissection or region of leakage of contrast into the intramural hematoma has increased at this level.   Nonvascular   1. Heterogeneous hyperdensity filling the majority of the bladder with hyperdense fluid fluid level favored as hematoma. There is mild stranding surrounding the bladder. Underlying bladder mass is not excluded. 2. Mild patchy ground-glass opacities in both lower lobes, left greater than right, likely infectious/inflammatory. 3. Cholelithiasis. 4. Borderline dilated loops of jejunum with air-fluid levels in the upper abdomen without definite transition point. Findings may represent ileus.   Aortic Atherosclerosis (ICD10-I70.0).     Assessment & Plan:   Principal Problem:   Gross hematuria Active Problems:   Symptomatic anemia   Acute/subacute gross hematuria: Acute blood loss anemia: - History of recurrent prostate cancer 3 years ago - CT angiography chest, abdomen and pelvis is as documented above. - Patient underwent cystoscopy and clot evacuation today.   - Three-way Foley catheter in situ.   - Hemoglobin has dropped to 5.9 g/dL.   - Transfused 2 units of packed red blood cells  -  Follow H/H.   - Urology input is highly appreciated.     AAA: -Recent descending aortic dissection and intramural hematoma status post endovascular  repair - CT angiography chest, abdomen and pelvis is as documented above. - CT images were reviewed by vascular surgeon who recommended no emergency surgery indicated.   Hypotension: - Multifactorial.   - Patient has been bleeding. - Patient was on several antihypertensives - Blood pressure closely.     AKI on CKD stage 3A: - Baseline serum creatinine of 1.13-1.42  - Admitted with serum creatinine of 2.06. - Serum creatinine of 1.74 today. - Avoid hypotension. - Keep MAP greater than 65 mmHg. - Avoid nephrotoxins. - Transfuse packed red blood cells to keep hemoglobin above 7 g/dL. -Continue to monitor renal function and electrolytes.     Type 2 diabetes mellitus:  - Most recent A1c 6.3 - Continue diet control.   Unintentional weight loss: -Anorexic -25-30 pounds of weight loss in last 4-5 months. - Low threshold to consider caloric count and dietary consult. - Cannot rule out recurrent malignancy?    DVT prophylaxis: SCD. Code Status: Full code Family Communication: Daughter by bedside Disposition Plan: Inpatient   Consultants:  Urology  Procedures:  Cystoscopy, evacuation, and fulguration of the bladder wall.  Antimicrobials:  IV Rocephin .   Subjective: No new complaints  Objective: Vitals:   03/18/24 2059 03/18/24 2114 03/19/24 0006 03/19/24 0430  BP: 104/62 107/64 102/65 113/62  Pulse: (!) 102 (!) 101 97 92  Resp: 18 18 18 20   Temp: 99.2 F (37.3 C) 99.3 F (37.4 C) 98.4 F (36.9 C) 98 F (36.7 C)  TempSrc: Oral Oral Oral Oral  SpO2:    92%  Weight:      Height:        Intake/Output Summary (Last 24 hours) at 03/19/2024 1052 Last data filed at 03/19/2024 0857 Gross per 24 hour  Intake 2269.83 ml  Output 7975 ml  Net -5705.17 ml   Filed Weights   03/17/24 2200  Weight: 80.5 kg    Examination:  General exam: Appears calm and comfortable  Respiratory system: Clear to auscultation. Respiratory effort normal. Cardiovascular system: S1 &  S2 heard Gastrointestinal system: Abdomen is soft and nontender.  Central nervous system: Alert and oriented.    Data Reviewed: I have personally reviewed following labs and imaging studies  CBC: Recent Labs  Lab 03/17/24 1152 03/17/24 1559 03/18/24 0020 03/18/24 0550 03/18/24 1241 03/18/24 1747 03/19/24 0245  WBC 10.7*  --  12.1*  --   --   --   --   HGB 7.5*   < > 7.3* 7.0* 5.9* 5.7* 7.5*  HCT 25.6*   < > 24.4* 24.5* 20.1* 19.6* 23.5*  MCV 74.9*  --  75.8*  --   --   --   --   PLT 440*  --  353  --   --   --   --    < > = values in this interval not displayed.   Basic Metabolic Panel: Recent Labs  Lab 03/17/24 1152 03/18/24 0020  NA 136 136  K 3.7 3.6  CL 99 101  CO2 25 22  GLUCOSE 145* 124*  BUN 32* 32*  CREATININE 2.06* 1.74*  CALCIUM  9.5 9.5   GFR: Estimated Creatinine Clearance: 36.7 mL/min (A) (by C-G formula based on SCr of 1.74 mg/dL (H)). Liver Function Tests: No results for input(s): AST, ALT, ALKPHOS, BILITOT, PROT, ALBUMIN  in the last 168 hours. No results  for input(s): LIPASE, AMYLASE in the last 168 hours. No results for input(s): AMMONIA in the last 168 hours. Coagulation Profile: No results for input(s): INR, PROTIME in the last 168 hours. Cardiac Enzymes: No results for input(s): CKTOTAL, CKMB, CKMBINDEX, TROPONINI in the last 168 hours. BNP (last 3 results) No results for input(s): PROBNP in the last 8760 hours. HbA1C: No results for input(s): HGBA1C in the last 72 hours. CBG: No results for input(s): GLUCAP in the last 168 hours. Lipid Profile: No results for input(s): CHOL, HDL, LDLCALC, TRIG, CHOLHDL, LDLDIRECT in the last 72 hours. Thyroid Function Tests: No results for input(s): TSH, T4TOTAL, FREET4, T3FREE, THYROIDAB in the last 72 hours. Anemia Panel: Recent Labs    03/17/24 2221  FERRITIN 573*  TIBC 188*  IRON 20*  RETICCTPCT 0.9   Urine analysis:    Component  Value Date/Time   COLORURINE (A) 03/17/2024 1437    TEST NOT REPORTED DUE TO COLOR INTERFERENCE OF URINE PIGMENT   APPEARANCEUR (A) 03/17/2024 1437    TEST NOT REPORTED DUE TO COLOR INTERFERENCE OF URINE PIGMENT   LABSPEC  03/17/2024 1437    TEST NOT REPORTED DUE TO COLOR INTERFERENCE OF URINE PIGMENT   LABSPEC 1.005 01/16/2013 0953   PHURINE  03/17/2024 1437    TEST NOT REPORTED DUE TO COLOR INTERFERENCE OF URINE PIGMENT   GLUCOSEU (A) 03/17/2024 1437    TEST NOT REPORTED DUE TO COLOR INTERFERENCE OF URINE PIGMENT   GLUCOSEU Negative 01/16/2013 0953   HGBUR (A) 03/17/2024 1437    TEST NOT REPORTED DUE TO COLOR INTERFERENCE OF URINE PIGMENT   BILIRUBINUR (A) 03/17/2024 1437    TEST NOT REPORTED DUE TO COLOR INTERFERENCE OF URINE PIGMENT   BILIRUBINUR Negative 01/16/2013 0953   KETONESUR (A) 03/17/2024 1437    TEST NOT REPORTED DUE TO COLOR INTERFERENCE OF URINE PIGMENT   PROTEINUR (A) 03/17/2024 1437    TEST NOT REPORTED DUE TO COLOR INTERFERENCE OF URINE PIGMENT   UROBILINOGEN 0.2 01/16/2013 0953   NITRITE (A) 03/17/2024 1437    TEST NOT REPORTED DUE TO COLOR INTERFERENCE OF URINE PIGMENT   LEUKOCYTESUR (A) 03/17/2024 1437    TEST NOT REPORTED DUE TO COLOR INTERFERENCE OF URINE PIGMENT   LEUKOCYTESUR Trace 01/16/2013 0953   Sepsis Labs: @LABRCNTIP (procalcitonin:4,lacticidven:4)  )No results found for this or any previous visit (from the past 240 hours).       Radiology Studies: CT Angio Chest/Abd/Pel for Dissection W and/or W/WO Result Date: 03/17/2024 CLINICAL DATA:  Acute aortic syndrome suspected. Blood in urine. Dysuria. EXAM: CT ANGIOGRAPHY CHEST, ABDOMEN AND PELVIS TECHNIQUE: Noncontrast chest CT was performed. Multidetector CT imaging through the chest, abdomen and pelvis was performed using the standard protocol during bolus administration of intravenous contrast. Multiplanar reconstructed images and MIPs were obtained and reviewed to evaluate the vascular anatomy.  RADIATION DOSE REDUCTION: This exam was performed according to the departmental dose-optimization program which includes automated exposure control, adjustment of the mA and/or kV according to patient size and/or use of iterative reconstruction technique. CONTRAST:  80mL OMNIPAQUE  IOHEXOL  350 MG/ML SOLN COMPARISON:  CT chest abdomen and pelvis 12/20/2023 FINDINGS: CTA CHEST FINDINGS Cardiovascular: There is a new descending thoracic aortic graft in place which appears widely patent. Intramural hematoma throughout the descending thoracic aorta again seen. The descending thoracic aorta is dilated measuring 5.4 cm. There is an endoleak along the proximal margin of the endograft with contrast filling the intramural hematoma. Origin of great vessels within normal limits. The heart  is normal in size.  There is no pericardial effusion. Mediastinum/Nodes: No enlarged mediastinal, hilar, or axillary lymph nodes. Thyroid gland, trachea, and esophagus demonstrate no significant findings. There are numerous nonenlarged mediastinal and hilar lymph nodes. Lungs/Pleura: There is a mild patchy ground-glass opacities in both lower lobes, left greater than right. Left pleural effusion has resolved. There is no focal lung consolidation or pneumothorax. Musculoskeletal: Degenerative changes affect the spine. Review of the MIP images confirms the above findings. CTA ABDOMEN AND PELVIS FINDINGS VASCULAR Aorta: There is aneurysmal dilatation of the proximal abdominal aorta at the level of the diaphragm measuring 2.7 x 3.8 cm similar to the prior study. Again seen is a small amount of contrast extravasation into intramural hematoma along the posterior left aspect of the aorta, slightly increased from prior examination. Infrarenal abdominal aortic aneurysm measures 3.0 cm and is unchanged in size. Focal dissection or region leakage of contrast into the intramural hematoma has increased at this level. Celiac: Patent without evidence of  aneurysm, dissection, vasculitis or significant stenosis. SMA: Patent without evidence of aneurysm, dissection, vasculitis or significant stenosis. Renals: Both renal arteries are patent without evidence of aneurysm, dissection, vasculitis, fibromuscular dysplasia or significant stenosis. IMA: Patent without evidence of aneurysm, dissection, vasculitis or significant stenosis. Inflow: Patent without evidence of aneurysm, dissection, vasculitis or significant stenosis. Veins: No obvious venous abnormality within the limitations of this arterial phase study. Review of the MIP images confirms the above findings. NON-VASCULAR Hepatobiliary: Gallstones are present. There is no biliary ductal dilatation. The liver is within normal limits. Pancreas: Unremarkable. No pancreatic ductal dilatation or surrounding inflammatory changes. Spleen: Normal in size without focal abnormality. Adrenals/Urinary Tract: There is heterogeneous hyperdensity filling the majority of the bladder with hyperdense fluid fluid level favored as hematoma. There is mild stranding surrounding the bladder. There is a subcentimeter hypodensity in the superior pole the right kidney which is unchanged, likely a cyst. Otherwise, the kidneys and adrenal glands are within normal limits. Stomach/Bowel: Stomach is within normal limits. Appendix appears normal. No evidence of bowel wall thickening, distention, or inflammatory changes. There is sigmoid colon diverticulosis. There some borderline dilated loops of jejunum with air-fluid levels in the upper abdomen without definite transition. Lymphatic: No enlarged lymph nodes are identified. Reproductive: Prostate radiotherapy seeds are present. Other: Small left and moderate right fat containing inguinal hernias are present. There is a small fat containing umbilical hernia. There is no ascites. Musculoskeletal: No acute or significant osseous findings. Review of the MIP images confirms the above findings.  IMPRESSION: Vascular 1. New descending thoracic aortic graft in place. There is an endoleak along the proximal margin of the endograft with contrast filling the intramural hematoma. 2. Stable aneurysmal dilatation of the proximal abdominal aorta at the level of the diaphragm measuring 3.8 cm. 3. Stable infrarenal abdominal aortic aneurysm measuring 3.0 cm. Focal dissection or region of leakage of contrast into the intramural hematoma has increased at this level. Nonvascular 1. Heterogeneous hyperdensity filling the majority of the bladder with hyperdense fluid fluid level favored as hematoma. There is mild stranding surrounding the bladder. Underlying bladder mass is not excluded. 2. Mild patchy ground-glass opacities in both lower lobes, left greater than right, likely infectious/inflammatory. 3. Cholelithiasis. 4. Borderline dilated loops of jejunum with air-fluid levels in the upper abdomen without definite transition point. Findings may represent ileus. Aortic Atherosclerosis (ICD10-I70.0). Electronically Signed   By: Greig Pique M.D.   On: 03/17/2024 15:43        Scheduled Meds:  aspirin  EC  81 mg Oral Q0600   carvedilol   6.25 mg Oral BID   feeding supplement  237 mL Oral BID BM   pantoprazole   40 mg Oral BID AC   polyethylene glycol  17 g Oral Daily   rosuvastatin   5 mg Oral Q M,W,F   Continuous Infusions:  sodium chloride      cefTRIAXone  (ROCEPHIN )  IV 1 g (03/18/24 1546)   sodium chloride  irrigation       LOS: 2 days    Time spent: 55 minutes    Leatrice Chapel, MD  [facility] Pager #: [phone] 7PM-7AM contact night coverage as above

## 2024-03-19 NOTE — Plan of Care (Signed)

## 2024-03-19 NOTE — Consult Note (Signed)
 Urology Consult   Physician requesting consult: Cort Mana, MD  Reason for consult: Hematuria and foley catheter placement  History of Present Illness: Nicholas Burke is a 78 y.o. male with PMH prostate cancer s/p radiation (2014) with recurrence and partial gland cryoablation (2022 with Dr. Nieves, most recent PSA 02/25 undetectable) who presented to [facility] ED with difficulty voiding and passage of clots per urethra, which had been ongoing for 3 days.   AFVSS in the ED. Labs notable for Cr 2.06 from baseline ~1.0. Hgb 7.5 from recent baseline ~10.0. CT notable for large clot in the bladder. Patient takes baby aspirin , no other AC. Pt reports he has had intermittent hematuria since his hospitalization in September, but it has usually cleared on its own and not prevented him from voiding. When the bleeding got worse and he had trouble emptying his bladder, he decided to present to the ED.  Pt follows with Dr. Cam for prostate cancer as well as some minor stress incontinence and intermittent hematuria. Pt had previously been on finasteride  for prostate varices and hematuria but stopped it in Feb 2025 as this had improved.  I was able to scope in an 18 Fr 3-way catheter at bedside and hand irrigate the bladder with return of small volume clot. Urine initially merlot. CBI was initiated with urine light red on moderate drip.   Interval 11/22: AFVSS. Urine cherry on moderate drip CBI. Unable to flush out any clot with 17 Fr catheter in place. Plan for OR for cysto/clot evac.  Interval 11/23: AFVSS. Pt did require 2 units pRBC yesterday after clot evac with appropriate response (5.9 --> 7.5). There appeared to be shredding of the posterior bladder wall, likely 2/2 clot retention. No intra-peritoneal defects. Urine is clear yellow this morning on very slow drip CBI; CBI clamped on am rounds. Plan to observe for 24 hours to ensure urine remains clear.   Past Medical History:  Diagnosis Date   AAA  (abdominal aortic aneurysm)    Hx of radiation therapy 01/02/13- 02/24/13   prostate 7800 cGy/40 sessions, seminal vesicles 5600 cGy/40 sessions   Hypertension    Pre-diabetes    Prostate cancer (HCC) 08/30/2012   gleason 6, vol 65.7 cc    Past Surgical History:  Procedure Laterality Date   CATARACT EXTRACTION, BILATERAL Bilateral    COLONOSCOPY WITH PROPOFOL  N/A 10/18/2014   Procedure: COLONOSCOPY WITH PROPOFOL ;  Surgeon: [name], MD;  Location: [facility] ENDOSCOPY;  Service: Endoscopy;  Laterality: N/A;   CRYOABLATION N/A 12/24/2020   Procedure: CRYO ABLATION PROSTATE;  Surgeon: [name], MD;  Location: [facility] ORS;  Service: Urology;  Laterality: N/A;   CYSTOSCOPY WITH FULGERATION N/A 03/18/2024   Procedure: CYSTOSCOPY, WITH BLADDER FULGURATION;  Surgeon: Sherrilee Belvie CROME, MD;  Location: [facility] ORS;  Service: Urology;  Laterality: N/A;   ESOPHAGOGASTRODUODENOSCOPY N/A 12/20/2023   Procedure: EGD (ESOPHAGOGASTRODUODENOSCOPY);  Surgeon: [name], MD;  Location: [facility] ENDOSCOPY;  Service: Gastroenterology;  Laterality: N/A;   ESOPHAGOGASTRODUODENOSCOPY N/A 12/26/2023   Procedure: EGD (ESOPHAGOGASTRODUODENOSCOPY);  Surgeon: [name], MD;  Location: [facility] ENDOSCOPY;  Service: Gastroenterology;  Laterality: N/A;   EYE SURGERY Bilateral    cataract surgery   HOT HEMOSTASIS N/A 10/18/2014   Procedure: HOT HEMOSTASIS (ARGON PLASMA COAGULATION/BICAP);  Surgeon: [name], MD;  Location: [facility];  Service: Endoscopy;  Laterality: N/A;   PROSTATE BIOPSY  08/2012   THORACIC AORTIC ENDOVASCULAR STENT GRAFT Right 02/14/2024   Procedure: INSERTION, ENDOVASCULAR STENT GRAFT, AORTA, THORACIC;  Surgeon: Lanis Fonda BRAVO,  MD;  Location: [facility] OR;  Service: Vascular;  Laterality: Right;   ULTRASOUND GUIDANCE Right 02/14/2024   Procedure: ULTRASOUND GUIDANCE;  Surgeon: Lanis Fonda BRAVO, MD;  Location: [facility] OR;  Service: Vascular;  Laterality: Right;    Current Hospital Medications:  Home Meds:   No current facility-administered medications on file prior to encounter.   Current Outpatient Medications on File Prior to Encounter  Medication Sig Dispense Refill   Acetaminophen  Extra Strength 500 MG CAPS Take 500-1,000 mg by mouth See admin instructions. TYLENOL  Extra Strength Rapid Release Gels - Take 500-1,000 mg by mouth every 8 hours as needed for pain     amLODipine  (NORVASC ) 10 MG tablet Take 1 tablet (10 mg total) by mouth daily. 90 tablet 3   aspirin  EC 81 MG tablet Take 1 tablet (81 mg total) by mouth daily at 6 (six) AM. Swallow whole.     carvedilol  (COREG ) 25 MG tablet Take 1 tablet (25 mg total) by mouth 2 (two) times daily with food. 180 tablet 1   hydrALAZINE  (APRESOLINE ) 50 MG tablet Take 50 mg by mouth 3 (three) times daily.     lisinopril  (ZESTRIL ) 20 MG tablet Take 1 tablet (20 mg total) by mouth daily. 90 tablet 1   Multiple Vitamins-Minerals (MENS 50+ MULTIVITAMIN) TABS Take 1 tablet by mouth daily with breakfast.     oxyCODONE -acetaminophen  (PERCOCET/ROXICET) 5-325 MG tablet Take 1 tablet by mouth every 6 (six) hours as needed for moderate pain (pain score 4-6). 10 tablet 0   pantoprazole  (PROTONIX ) 40 MG tablet Take 1 tablet (40 mg total) by mouth 2 (two) times daily before a meal. 60 tablet 1   polyethylene glycol powder (GLYCOLAX /MIRALAX ) 17 GM/SCOOP powder Dissolve 1 capful (17g) in 4-8 ounces of liquid and take by mouth daily. (Patient taking differently: Take 17 g by mouth daily as needed for mild constipation.) 238 g 0   rosuvastatin  (CRESTOR ) 5 MG tablet Take 5 mg by mouth every Monday, Wednesday, and Friday at 6 PM.     albuterol  (VENTOLIN  HFA) 108 (90 Base) MCG/ACT inhaler Inhale 2 puffs into the lungs every 4 (four) hours as needed for wheezing or shortness of breath. (Patient not taking: Reported on 03/17/2024) 6.7 g 0     Scheduled Meds:  aspirin  EC  81 mg Oral Q0600   carvedilol   6.25 mg Oral BID   feeding supplement  237 mL Oral BID BM   pantoprazole    40 mg Oral BID AC   polyethylene glycol  17 g Oral Daily   rosuvastatin   5 mg Oral Q M,W,F   Continuous Infusions:  sodium chloride      cefTRIAXone  (ROCEPHIN )  IV 1 g (03/18/24 1546)   sodium chloride  irrigation     PRN Meds:.acetaminophen  **OR** acetaminophen , albuterol , hydrALAZINE , HYDROmorphone  (DILAUDID ) injection, ondansetron  **OR** ondansetron  (ZOFRAN ) IV, oxyCODONE -acetaminophen   Allergies:  Allergies  Allergen Reactions   Reglan  [Metoclopramide ] Other (See Comments)    Pt states that it made him shake and had difficulty speaking    Family History  Problem Relation Age of Onset   Prostate cancer Father    Prostate cancer Brother     Social History:  reports that he has never smoked. He has never used smokeless tobacco. He reports that he does not drink alcohol and does not use drugs.  ROS: A complete review of systems was performed.  All systems are negative except for pertinent findings as noted.  Physical Exam:  Vital signs in last 24 hours: Temp:  [  98 F (36.7 C)-99.5 F (37.5 C)] 98 F (36.7 C) (11/23 0430) Pulse Rate:  [92-103] 92 (11/23 0430) Resp:  [18-31] 20 (11/23 0430) BP: (102-119)/(54-65) 113/62 (11/23 0430) SpO2:  [91 %-100 %] 92 % (11/23 0430) Constitutional:  Alert and oriented, No acute distress Cardiovascular: Regular rate and rhythm, No JVD Respiratory: Normal respiratory effort, Lungs clear bilaterally GI: Abdomen is soft, nondistended GU: Foley catheter in place draining clear yellow urine with CBI clamped Lymphatic: No lymphadenopathy Neurologic: Grossly intact, no focal deficits Psychiatric: Normal mood and affect  Laboratory Data:  Recent Labs    03/17/24 1152 03/17/24 1559 03/18/24 0020 03/18/24 0550 03/18/24 1241 03/18/24 1747 03/19/24 0245  WBC 10.7*  --  12.1*  --   --   --   --   HGB 7.5*   < > 7.3* 7.0* 5.9* 5.7* 7.5*  HCT 25.6*   < > 24.4* 24.5* 20.1* 19.6* 23.5*  PLT 440*  --  353  --   --   --   --    < > = values  in this interval not displayed.    Recent Labs    03/17/24 1152 03/18/24 0020  NA 136 136  K 3.7 3.6  CL 99 101  GLUCOSE 145* 124*  BUN 32* 32*  CALCIUM  9.5 9.5  CREATININE 2.06* 1.74*     Results for orders placed or performed during the hospital encounter of 03/17/24 (from the past 24 hours)  Hemoglobin and hematocrit, blood     Status: Abnormal   Collection Time: 03/18/24 12:41 PM  Result Value Ref Range   Hemoglobin 5.9 (LL) 13.0 - 17.0 g/dL   HCT 79.8 (L) 60.9 - 47.9 %  Prepare RBC (crossmatch)     Status: None   Collection Time: 03/18/24 12:41 PM  Result Value Ref Range   Order Confirmation      ORDER PROCESSED BY BLOOD BANK Performed at [facility], [address] [place], [place] [address]   Prepare RBC (crossmatch)     Status: None   Collection Time: 03/18/24  2:15 PM  Result Value Ref Range   Order Confirmation      ORDER PROCESSED BY BLOOD BANK Performed at [facility], [address] [place], [place] [address]   Hemoglobin and hematocrit, blood     Status: Abnormal   Collection Time: 03/18/24  5:47 PM  Result Value Ref Range   Hemoglobin 5.7 (LL) 13.0 - 17.0 g/dL   HCT 80.3 (L) 60.9 - 47.9 %  Hemoglobin and hematocrit, blood     Status: Abnormal   Collection Time: 03/19/24  2:45 AM  Result Value Ref Range   Hemoglobin 7.5 (L) 13.0 - 17.0 g/dL   HCT 76.4 (L) 60.9 - 47.9 %   No results found for this or any previous visit (from the past 240 hours).  Renal Function: Recent Labs    03/17/24 1152 03/18/24 0020  CREATININE 2.06* 1.74*   Estimated Creatinine Clearance: 36.7 mL/min (A) (by C-G formula based on SCr of 1.74 mg/dL (H)).  Radiologic Imaging: CT Angio Chest/Abd/Pel for Dissection W and/or W/WO Result Date: 03/17/2024 CLINICAL DATA:  Acute aortic syndrome suspected. Blood in urine. Dysuria. EXAM: CT ANGIOGRAPHY CHEST, ABDOMEN AND PELVIS TECHNIQUE: Noncontrast chest CT was performed. Multidetector CT  imaging through the chest, abdomen and pelvis was performed using the standard protocol during bolus administration of intravenous contrast. Multiplanar reconstructed images and MIPs were obtained and reviewed to evaluate the vascular  anatomy. RADIATION DOSE REDUCTION: This exam was performed according to the departmental dose-optimization program which includes automated exposure control, adjustment of the mA and/or kV according to patient size and/or use of iterative reconstruction technique. CONTRAST:  80mL OMNIPAQUE  IOHEXOL  350 MG/ML SOLN COMPARISON:  CT chest abdomen and pelvis 12/20/2023 FINDINGS: CTA CHEST FINDINGS Cardiovascular: There is a new descending thoracic aortic graft in place which appears widely patent. Intramural hematoma throughout the descending thoracic aorta again seen. The descending thoracic aorta is dilated measuring 5.4 cm. There is an endoleak along the proximal margin of the endograft with contrast filling the intramural hematoma. Origin of great vessels within normal limits. The heart is normal in size.  There is no pericardial effusion. Mediastinum/Nodes: No enlarged mediastinal, hilar, or axillary lymph nodes. Thyroid gland, trachea, and esophagus demonstrate no significant findings. There are numerous nonenlarged mediastinal and hilar lymph nodes. Lungs/Pleura: There is a mild patchy ground-glass opacities in both lower lobes, left greater than right. Left pleural effusion has resolved. There is no focal lung consolidation or pneumothorax. Musculoskeletal: Degenerative changes affect the spine. Review of the MIP images confirms the above findings. CTA ABDOMEN AND PELVIS FINDINGS VASCULAR Aorta: There is aneurysmal dilatation of the proximal abdominal aorta at the level of the diaphragm measuring 2.7 x 3.8 cm similar to the prior study. Again seen is a small amount of contrast extravasation into intramural hematoma along the posterior left aspect of the aorta, slightly increased from  prior examination. Infrarenal abdominal aortic aneurysm measures 3.0 cm and is unchanged in size. Focal dissection or region leakage of contrast into the intramural hematoma has increased at this level. Celiac: Patent without evidence of aneurysm, dissection, vasculitis or significant stenosis. SMA: Patent without evidence of aneurysm, dissection, vasculitis or significant stenosis. Renals: Both renal arteries are patent without evidence of aneurysm, dissection, vasculitis, fibromuscular dysplasia or significant stenosis. IMA: Patent without evidence of aneurysm, dissection, vasculitis or significant stenosis. Inflow: Patent without evidence of aneurysm, dissection, vasculitis or significant stenosis. Veins: No obvious venous abnormality within the limitations of this arterial phase study. Review of the MIP images confirms the above findings. NON-VASCULAR Hepatobiliary: Gallstones are present. There is no biliary ductal dilatation. The liver is within normal limits. Pancreas: Unremarkable. No pancreatic ductal dilatation or surrounding inflammatory changes. Spleen: Normal in size without focal abnormality. Adrenals/Urinary Tract: There is heterogeneous hyperdensity filling the majority of the bladder with hyperdense fluid fluid level favored as hematoma. There is mild stranding surrounding the bladder. There is a subcentimeter hypodensity in the superior pole the right kidney which is unchanged, likely a cyst. Otherwise, the kidneys and adrenal glands are within normal limits. Stomach/Bowel: Stomach is within normal limits. Appendix appears normal. No evidence of bowel wall thickening, distention, or inflammatory changes. There is sigmoid colon diverticulosis. There some borderline dilated loops of jejunum with air-fluid levels in the upper abdomen without definite transition. Lymphatic: No enlarged lymph nodes are identified. Reproductive: Prostate radiotherapy seeds are present. Other: Small left and moderate  right fat containing inguinal hernias are present. There is a small fat containing umbilical hernia. There is no ascites. Musculoskeletal: No acute or significant osseous findings. Review of the MIP images confirms the above findings. IMPRESSION: Vascular 1. New descending thoracic aortic graft in place. There is an endoleak along the proximal margin of the endograft with contrast filling the intramural hematoma. 2. Stable aneurysmal dilatation of the proximal abdominal aorta at the level of the diaphragm measuring 3.8 cm. 3. Stable infrarenal abdominal aortic aneurysm measuring  3.0 cm. Focal dissection or region of leakage of contrast into the intramural hematoma has increased at this level. Nonvascular 1. Heterogeneous hyperdensity filling the majority of the bladder with hyperdense fluid fluid level favored as hematoma. There is mild stranding surrounding the bladder. Underlying bladder mass is not excluded. 2. Mild patchy ground-glass opacities in both lower lobes, left greater than right, likely infectious/inflammatory. 3. Cholelithiasis. 4. Borderline dilated loops of jejunum with air-fluid levels in the upper abdomen without definite transition point. Findings may represent ileus. Aortic Atherosclerosis (ICD10-I70.0). Electronically Signed   By: Greig Pique M.D.   On: 03/17/2024 15:43    I independently reviewed the above imaging studies.  Impression/Recommendation Pt is a 78 yo man with PMH AAA with recent type B aortic dissection and intramural aortic hematoma status post endovascular repair September - October 2025, HTN, CKD stage IV, prediabetes, prostate cancer status post radiation therapy in 2014, with recurrence and right Hemi cryoablation prostate 2022 (most recent PSA in 2/25 undetectable) who presented to [facility] ED in clot retention.   He is now doing well POD1 clot evac and fulguration. There appeared to be some shredding of his posterior bladder wall, likely 2/2 clot retention and stretch  injury. His urine is now  clear yellow. Given bladder injury, would recommend catheter remain in place for at least 14 days with cystogram prior to removal.   - Continue CBI clamped; urology will check urine this afternoon - Plan for foley to remain in place for 14 days with CT cystogram prior to removal  - Recommend starting mirabegron  25 mg daily to prevent bladder spasms with catheter in place-- patient should stop this at least 48 hours prior to cystogram and catheter removal   Maurilio Agar 03/19/2024, 10:52 AM

## 2024-03-20 DIAGNOSIS — R310 Gross hematuria: Secondary | ICD-10-CM | POA: Diagnosis not present

## 2024-03-20 LAB — CBC WITH DIFFERENTIAL/PLATELET
Abs Immature Granulocytes: 0.29 K/uL — ABNORMAL HIGH (ref 0.00–0.07)
Basophils Absolute: 0 K/uL (ref 0.0–0.1)
Basophils Relative: 0 %
Eosinophils Absolute: 0.1 K/uL (ref 0.0–0.5)
Eosinophils Relative: 0 %
HCT: 24.6 % — ABNORMAL LOW (ref 39.0–52.0)
Hemoglobin: 7.6 g/dL — ABNORMAL LOW (ref 13.0–17.0)
Immature Granulocytes: 2 %
Lymphocytes Relative: 8 %
Lymphs Abs: 1.5 K/uL (ref 0.7–4.0)
MCH: 24 pg — ABNORMAL LOW (ref 26.0–34.0)
MCHC: 30.9 g/dL (ref 30.0–36.0)
MCV: 77.6 fL — ABNORMAL LOW (ref 80.0–100.0)
Monocytes Absolute: 0.8 K/uL (ref 0.1–1.0)
Monocytes Relative: 4 %
Neutro Abs: 15.7 K/uL — ABNORMAL HIGH (ref 1.7–7.7)
Neutrophils Relative %: 86 %
Platelets: 261 K/uL (ref 150–400)
RBC: 3.17 MIL/uL — ABNORMAL LOW (ref 4.22–5.81)
RDW: 19 % — ABNORMAL HIGH (ref 11.5–15.5)
WBC: 18.3 K/uL — ABNORMAL HIGH (ref 4.0–10.5)
nRBC: 0 % (ref 0.0–0.2)

## 2024-03-20 LAB — RENAL FUNCTION PANEL
Albumin: 2.7 g/dL — ABNORMAL LOW (ref 3.5–5.0)
Anion gap: 10 (ref 5–15)
BUN: 38 mg/dL — ABNORMAL HIGH (ref 8–23)
CO2: 23 mmol/L (ref 22–32)
Calcium: 8.7 mg/dL — ABNORMAL LOW (ref 8.9–10.3)
Chloride: 107 mmol/L (ref 98–111)
Creatinine, Ser: 1.33 mg/dL — ABNORMAL HIGH (ref 0.61–1.24)
GFR, Estimated: 55 mL/min — ABNORMAL LOW (ref >60)
Glucose, Bld: 117 mg/dL — ABNORMAL HIGH (ref 70–99)
Phosphorus: 3.4 mg/dL (ref 2.5–4.6)
Potassium: 3.8 mmol/L (ref 3.5–5.1)
Sodium: 140 mmol/L (ref 135–145)

## 2024-03-20 LAB — MAGNESIUM: Magnesium: 2.2 mg/dL (ref 1.7–2.4)

## 2024-03-20 MED ORDER — ENSURE PLUS HIGH PROTEIN PO LIQD: 237.0000 mL | Freq: Two times a day (BID) | ORAL | 1 refills | Status: AC

## 2024-03-20 MED ORDER — CARVEDILOL 6.25 MG PO TABS: 6.2500 mg | ORAL_TABLET | Freq: Two times a day (BID) | ORAL | 1 refills | Status: DC

## 2024-03-20 MED ORDER — MIRABEGRON ER 25 MG PO TB24: 25.0000 mg | ORAL_TABLET | Freq: Every day | ORAL | 1 refills | Status: DC

## 2024-03-20 MED ORDER — ACETAMINOPHEN 325 MG PO TABS: 650.0000 mg | ORAL_TABLET | Freq: Four times a day (QID) | ORAL | Status: AC | PRN

## 2024-03-20 NOTE — Plan of Care (Signed)

## 2024-03-20 NOTE — Discharge Summary (Signed)
 Physician Discharge Summary  Patient ID: Nicholas Burke MRN: [id number] DOB/AGE: Jul 25, 1945 78 y.o.  Admit date: 03/17/2024 Discharge date: 03/20/2024  Admission Diagnoses:  Discharge Diagnoses:  Principal Problem:   Gross hematuria Active Problems:   Symptomatic anemia   Discharged Condition: stable  Hospital Course:  Patient is a 78 year old male with past medical history significant of AAA with recent type B aortic dissection and intramural aortic hematoma status post endovascular repair September - October 2025, hypertension, CKD stage IV, prediabetes, prostate cancer status pos radiation therapy in 2014, with recurrence and right Hemi cryoablation prostate 2022.  Patient presented with worsening lower abdominal pain and gross hematuria.   Patient has a known AAA was diagnosed with type B dissection and intramural aortic hematoma for which he underwent endovascular repair on October 20.  He was discharged home on October 22 and since then patient has had intermittent hematuria, usually cleared within 24 hours.  Few weeks prior to presentation, patient started having more frequent/hematuria episodes, with associated lower abdominal pain, cramp-like.  Denies any fever or chills.  2 days prior to presentation, patient developed severe hematuria, with incontinence.    CT angio chest, abdomen and pelvis revealed new descending thoracic aortic graft with endoleak along the proximal margin of the endograft with contrast filling the intramural hematoma, heterogeneous hyperdensity feeling the majority of the bladder with hyperdense fluid favored as hematoma.  Underlying bladder mass could not be ruled out.  Blood work revealed WBC 10.7, hemoglobin of 7.5 g/dl (compared to baseline 1.1-89.9 from last months), serum creatinine  of 2.0 (compared to baseline 1.4-1.5).   Patient underwent cystoscopy, clot evacuation and fulguration of the bleeding from posterior bladder wall.  There appeared to be some  shredding of the posterior bladder wall, likely secondary to clot retention and stretch injury.  Urology team has recommended the foley catheter remain in place for 14 days.  Patient has been cleared for discharge by the Urology team.  Urology team also recommended Mirabegron   25 mg po daily to prevent bladder spasms with catheter in place, with the instruction for the patient to stop at least 48 hours prior to cystogram and catheter removal.  Patient will follow up with PCP, Urology and Vascular surgery team on discharge.  Patient was transfused 2 units of packed red blood cells during the hospital stay.  Hemoglobin has remained stable.  Acute/subacute gross hematuria: Acute blood loss anemia: - History of recurrent prostate cancer 3 years ago - CT angiography chest, abdomen and pelvis is as documented. - Patient underwent cystoscopy and clot evacuation during the hospital stay.   - Three-way Foley catheter has been discontinued.   -Foley catheter will left in place for about 14 days post discharge.     - Posttransfusion hemoglobin remains stable at 7.5 g/dL.   -Patient was transfused 2 units of packed red blood cells during the hospital stay   AAA: -Recent descending aortic dissection and intramural hematoma status post endovascular repair - CT angiography chest, abdomen and pelvis is as documented above. - CT images were reviewed by vascular surgeon who recommended no emergency surgery indicated. -Patient will follow-up with vascular surgery team on discharge.   Hypotension: - Multifactorial.   - Patient was bleeding, patient was on several antihypertensives (that were held during the hospital stay). -Blood pressure has improved    AKI on CKD stage 3A: - Baseline serum creatinine of 1.13-1.42  - Admitted with serum creatinine of 2.06. - Serum creatinine improved to 1.33 prior to  discharge. - Avoid hypotension. - Keep MAP greater than 65 mmHg. - Avoid nephrotoxins. - Transfused packed  red blood cells to keep hemoglobin above 7 g/dL. -Continue to monitor renal function and electrolytes.     Type 2 diabetes mellitus:  - Most recent A1c 6.3 - Continue diet control.   Unintentional weight loss: -Anorexic -25-30 pounds of weight loss in last 4-5 months. - Low threshold to consider caloric count and dietary consult. - Cannot rule out recurrent malignancy     Consults: urology  Significant Diagnostic Studies:  CT angiography chest, abdomen and pelvis revealed: 1. New descending thoracic aortic graft in place. There is an endoleak along the proximal margin of the endograft with contrast filling the intramural hematoma. 2. Stable aneurysmal dilatation of the proximal abdominal aorta at the level of the diaphragm measuring 3.8 cm. 3. Stable infrarenal abdominal aortic aneurysm measuring 3.0 cm. Focal dissection or region of leakage of contrast into the intramural hematoma has increased at this level.   Nonvascular   1. Heterogeneous hyperdensity filling the majority of the bladder with hyperdense fluid fluid level favored as hematoma. There is mild stranding surrounding the bladder. Underlying bladder mass is not excluded. 2. Mild patchy ground-glass opacities in both lower lobes, left greater than right, likely infectious/inflammatory. 3. Cholelithiasis. 4. Borderline dilated loops of jejunum with air-fluid levels in the upper abdomen without definite transition point. Findings may represent ileus.   Aortic Atherosclerosis (ICD10-I70.0).  Hemoglobin on presentation was 5.9 g/dL.  Prior to discharge, hemoglobin was 7.6 g/dL Serum creatinine improved from 2.06-1.33 prior to discharge.  Cystoscopy revealed: multiple areas of irritation from foley catheter. 300cc clot in the bladder. Active bleeding from posterior wall of bladder.  Ureteral orifices in normal anatomic location.   Treatments: Patient was transfused with 2 units of packed red blood cells.   Cystoscopy, clot evacuation fulguration of bladder wall.  Discharge Exam: Blood pressure 122/70, pulse 87, temperature 97.9 F (36.6 C), temperature source Oral, resp. rate 16, height 5' 10 (1.778 m), weight 80.5 kg, SpO2 96%.   Disposition: Discharge disposition: 01-Home or Self Care       Discharge Instructions     Diet - low sodium heart healthy   Complete by: As directed    Increase activity slowly   Complete by: As directed       Allergies as of 03/20/2024       Reactions   Reglan  [metoclopramide ] Other (See Comments)   Pt states that it made him shake and had difficulty speaking        Medication List     STOP taking these medications    Acetaminophen  Extra Strength 500 MG Caps Replaced by: acetaminophen  325 MG tablet   amLODipine  10 MG tablet Commonly known as: NORVASC    hydrALAZINE  50 MG tablet Commonly known as: APRESOLINE    lisinopril  20 MG tablet Commonly known as: ZESTRIL    Mens 50+ Multivitamin Tabs       TAKE these medications    acetaminophen  325 MG tablet Commonly known as: TYLENOL  Take 2 tablets (650 mg total) by mouth every 6 (six) hours as needed for mild pain (pain score 1-3) or fever (or Fever >/= 101). Replaces: Acetaminophen  Extra Strength 500 MG Caps   albuterol  108 (90 Base) MCG/ACT inhaler Commonly known as: VENTOLIN  HFA Inhale 2 puffs into the lungs every 4 (four) hours as needed for wheezing or shortness of breath.   aspirin  EC 81 MG tablet Take 1 tablet (81 mg total) by  mouth daily at 6 (six) AM. Swallow whole.   carvedilol  6.25 MG tablet Commonly known as: COREG  Take 1 tablet (6.25 mg total) by mouth 2 (two) times daily. What changed:  medication strength how much to take   feeding supplement Liqd Take 237 mLs by mouth 2 (two) times daily between meals. Start taking on: March 21, 2024   mirabegron  ER 25 MG Tb24 tablet Commonly known as: MYRBETRIQ  Take 1 tablet (25 mg total) by mouth daily.    oxyCODONE -acetaminophen  5-325 MG tablet Commonly known as: PERCOCET/ROXICET Take 1 tablet by mouth every 6 (six) hours as needed for moderate pain (pain score 4-6).   pantoprazole  40 MG tablet Commonly known as: PROTONIX  Take 1 tablet (40 mg total) by mouth 2 (two) times daily before a meal.   polyethylene glycol powder 17 GM/SCOOP powder Commonly known as: GLYCOLAX /MIRALAX  Dissolve 1 capful (17g) in 4-8 ounces of liquid and take by mouth daily. What changed:  when to take this reasons to take this   rosuvastatin  5 MG tablet Commonly known as: CRESTOR  Take 5 mg by mouth every Monday, Wednesday, and Friday at 6 PM.        Follow-up Information     Sparks, Milo, DO Follow up in 1 week(s).   Specialty: Family Medicine Contact information: [address] [place] [address] [phone]         Sherrilee Belvie CROME, MD Follow up in 2 week(s).   Specialty: Urology Contact information: [address] [place] [address] [phone]                 Time spent: 35 minutes.  SignedBETHA Leatrice LILLETTE Rosario 03/20/2024, 2:36 PM

## 2024-03-20 NOTE — Consult Note (Cosign Needed Addendum)
 Urology Consult   Physician requesting consult: Cort Mana, MD  Reason for consult: Hematuria and foley catheter placement  History of Present Illness: Nicholas Burke is a 78 y.o. male with PMH prostate cancer s/p radiation (2014) with recurrence and partial gland cryoablation (2022 with Dr. Nieves, most recent PSA 02/25 undetectable) who presented to [facility] ED with difficulty voiding and passage of clots per urethra, which had been ongoing for 3 days.   AFVSS in the ED. Labs notable for Cr 2.06 from baseline ~1.0. Hgb 7.5 from recent baseline ~10.0. CT notable for large clot in the bladder. Patient takes baby aspirin , no other AC. Pt reports he has had intermittent hematuria since his hospitalization in September, but it has usually cleared on its own and not prevented him from voiding. When the bleeding got worse and he had trouble emptying his bladder, he decided to present to the ED.  Pt follows with Dr. Cam for prostate cancer as well as some minor stress incontinence and intermittent hematuria. Pt had previously been on finasteride  for prostate varices and hematuria but stopped it in Feb 2025 as this had improved.  I was able to scope in an 18 Fr 3-way catheter at bedside and hand irrigate the bladder with return of small volume clot. Urine initially merlot. CBI was initiated with urine light red on moderate drip.   Interval 11/22: AFVSS. Urine cherry on moderate drip CBI. Unable to flush out any clot with 17 Fr catheter in place. Plan for OR for cysto/clot evac.  Interval 11/23: AFVSS. Pt did require 2 units pRBC yesterday after clot evac with appropriate response (5.9 --> 7.5). There appeared to be shredding of the posterior bladder wall, likely 2/2 clot retention. No intra-peritoneal defects. Urine is clear yellow this morning on very slow drip CBI; CBI clamped on am rounds. Plan to observe for 24 hours to ensure urine remains clear.   Interval 11/24: Urine remains clear yellow off CBI.  Hgb stable at 7.6.  Past Medical History:  Diagnosis Date   AAA (abdominal aortic aneurysm)    Hx of radiation therapy 01/02/13- 02/24/13   prostate 7800 cGy/40 sessions, seminal vesicles 5600 cGy/40 sessions   Hypertension    Pre-diabetes    Prostate cancer (HCC) 08/30/2012   gleason 6, vol 65.7 cc    Past Surgical History:  Procedure Laterality Date   CATARACT EXTRACTION, BILATERAL Bilateral    COLONOSCOPY WITH PROPOFOL  N/A 10/18/2014   Procedure: COLONOSCOPY WITH PROPOFOL ;  Surgeon: [name], MD;  Location: [facility] ENDOSCOPY;  Service: Endoscopy;  Laterality: N/A;   CRYOABLATION N/A 12/24/2020   Procedure: CRYO ABLATION PROSTATE;  Surgeon: [name], MD;  Location: [facility] ORS;  Service: Urology;  Laterality: N/A;   CYSTOSCOPY WITH FULGERATION N/A 03/18/2024   Procedure: CYSTOSCOPY, WITH BLADDER FULGURATION;  Surgeon: Sherrilee Belvie CROME, MD;  Location: [facility] ORS;  Service: Urology;  Laterality: N/A;   ESOPHAGOGASTRODUODENOSCOPY N/A 12/20/2023   Procedure: EGD (ESOPHAGOGASTRODUODENOSCOPY);  Surgeon: [name], MD;  Location: [facility] ENDOSCOPY;  Service: Gastroenterology;  Laterality: N/A;   ESOPHAGOGASTRODUODENOSCOPY N/A 12/26/2023   Procedure: EGD (ESOPHAGOGASTRODUODENOSCOPY);  Surgeon: [name], MD;  Location: [facility] ENDOSCOPY;  Service: Gastroenterology;  Laterality: N/A;   EYE SURGERY Bilateral    cataract surgery   HOT HEMOSTASIS N/A 10/18/2014   Procedure: HOT HEMOSTASIS (ARGON PLASMA COAGULATION/BICAP);  Surgeon: [name], MD;  Location: [facility];  Service: Endoscopy;  Laterality: N/A;   PROSTATE BIOPSY  08/2012   THORACIC AORTIC ENDOVASCULAR STENT GRAFT Right 02/14/2024  Procedure: INSERTION, ENDOVASCULAR STENT GRAFT, AORTA, THORACIC;  Surgeon: Lanis Fonda BRAVO, MD;  Location: [facility] OR;  Service: Vascular;  Laterality: Right;   ULTRASOUND GUIDANCE Right 02/14/2024   Procedure: ULTRASOUND GUIDANCE;  Surgeon: Lanis Fonda BRAVO, MD;  Location: [facility] OR;  Service: Vascular;   Laterality: Right;    Current Hospital Medications:  Home Meds:  No current facility-administered medications on file prior to encounter.   Current Outpatient Medications on File Prior to Encounter  Medication Sig Dispense Refill   Acetaminophen  Extra Strength 500 MG CAPS Take 500-1,000 mg by mouth See admin instructions. TYLENOL  Extra Strength Rapid Release Gels - Take 500-1,000 mg by mouth every 8 hours as needed for pain     amLODipine  (NORVASC ) 10 MG tablet Take 1 tablet (10 mg total) by mouth daily. 90 tablet 3   aspirin  EC 81 MG tablet Take 1 tablet (81 mg total) by mouth daily at 6 (six) AM. Swallow whole.     carvedilol  (COREG ) 25 MG tablet Take 1 tablet (25 mg total) by mouth 2 (two) times daily with food. 180 tablet 1   hydrALAZINE  (APRESOLINE ) 50 MG tablet Take 50 mg by mouth 3 (three) times daily.     lisinopril  (ZESTRIL ) 20 MG tablet Take 1 tablet (20 mg total) by mouth daily. 90 tablet 1   Multiple Vitamins-Minerals (MENS 50+ MULTIVITAMIN) TABS Take 1 tablet by mouth daily with breakfast.     oxyCODONE -acetaminophen  (PERCOCET/ROXICET) 5-325 MG tablet Take 1 tablet by mouth every 6 (six) hours as needed for moderate pain (pain score 4-6). 10 tablet 0   pantoprazole  (PROTONIX ) 40 MG tablet Take 1 tablet (40 mg total) by mouth 2 (two) times daily before a meal. 60 tablet 1   polyethylene glycol powder (GLYCOLAX /MIRALAX ) 17 GM/SCOOP powder Dissolve 1 capful (17g) in 4-8 ounces of liquid and take by mouth daily. (Patient taking differently: Take 17 g by mouth daily as needed for mild constipation.) 238 g 0   rosuvastatin  (CRESTOR ) 5 MG tablet Take 5 mg by mouth every Monday, Wednesday, and Friday at 6 PM.     albuterol  (VENTOLIN  HFA) 108 (90 Base) MCG/ACT inhaler Inhale 2 puffs into the lungs every 4 (four) hours as needed for wheezing or shortness of breath. (Patient not taking: Reported on 03/17/2024) 6.7 g 0     Scheduled Meds:  aspirin  EC  81 mg Oral Q0600   carvedilol   6.25  mg Oral BID   Chlorhexidine  Gluconate Cloth  6 each Topical Q0600   feeding supplement  237 mL Oral BID BM   pantoprazole   40 mg Oral BID AC   polyethylene glycol  17 g Oral Daily   rosuvastatin   5 mg Oral Q M,W,F   Continuous Infusions:  sodium chloride      cefTRIAXone  (ROCEPHIN )  IV 1 g (03/19/24 1707)   sodium chloride  irrigation     PRN Meds:.acetaminophen  **OR** acetaminophen , albuterol , hydrALAZINE , HYDROmorphone  (DILAUDID ) injection, ondansetron  **OR** ondansetron  (ZOFRAN ) IV, oxyCODONE -acetaminophen   Allergies:  Allergies  Allergen Reactions   Reglan  [Metoclopramide ] Other (See Comments)    Pt states that it made him shake and had difficulty speaking    Family History  Problem Relation Age of Onset   Prostate cancer Father    Prostate cancer Brother     Social History:  reports that he has never smoked. He has never used smokeless tobacco. He reports that he does not drink alcohol and does not use drugs.  ROS: A complete review of systems was performed.  All systems are negative except for pertinent findings as noted.  Physical Exam:  Vital signs in last 24 hours: Temp:  [97.7 F (36.5 C)-98.9 F (37.2 C)] 97.7 F (36.5 C) (11/24 0517) Pulse Rate:  [80-91] 80 (11/24 0517) Resp:  [16-19] 19 (11/24 0517) BP: (109-119)/(63-73) 118/73 (11/24 0517) SpO2:  [93 %-96 %] 96 % (11/24 0517) Constitutional:  Alert and oriented, No acute distress Cardiovascular: Regular rate and rhythm, No JVD Respiratory: Normal respiratory effort, Lungs clear bilaterally GI: Abdomen is soft, nondistended GU: Foley catheter in place draining clear yellow urine with CBI off. Lymphatic: No lymphadenopathy Neurologic: Grossly intact, no focal deficits Psychiatric: Normal mood and affect  Laboratory Data:  Recent Labs    03/17/24 1152 03/17/24 1559 03/18/24 0020 03/18/24 0550 03/18/24 1241 03/18/24 1747 03/19/24 0245 03/20/24 0441  WBC 10.7*  --  12.1*  --   --   --   --  18.3*   HGB 7.5*   < > 7.3* 7.0* 5.9* 5.7* 7.5* 7.6*  HCT 25.6*   < > 24.4* 24.5* 20.1* 19.6* 23.5* 24.6*  PLT 440*  --  353  --   --   --   --  261   < > = values in this interval not displayed.    Recent Labs    03/17/24 1152 03/18/24 0020 03/20/24 0441  NA 136 136 140  K 3.7 3.6 3.8  CL 99 101 107  GLUCOSE 145* 124* 117*  BUN 32* 32* 38*  CALCIUM  9.5 9.5 8.7*  CREATININE 2.06* 1.74* 1.33*     Results for orders placed or performed during the hospital encounter of 03/17/24 (from the past 24 hours)  Renal function panel     Status: Abnormal   Collection Time: 03/20/24  4:41 AM  Result Value Ref Range   Sodium 140 135 - 145 mmol/L   Potassium 3.8 3.5 - 5.1 mmol/L   Chloride 107 98 - 111 mmol/L   CO2 23 22 - 32 mmol/L   Glucose, Bld 117 (H) 70 - 99 mg/dL   BUN 38 (H) 8 - 23 mg/dL   Creatinine, Ser 8.66 (H) 0.61 - 1.24 mg/dL   Calcium  8.7 (L) 8.9 - 10.3 mg/dL   Phosphorus 3.4 2.5 - 4.6 mg/dL   Albumin  2.7 (L) 3.5 - 5.0 g/dL   GFR, Estimated 55 (L) >60 mL/min   Anion gap 10 5 - 15  CBC with Differential/Platelet     Status: Abnormal   Collection Time: 03/20/24  4:41 AM  Result Value Ref Range   WBC 18.3 (H) 4.0 - 10.5 K/uL   RBC 3.17 (L) 4.22 - 5.81 MIL/uL   Hemoglobin 7.6 (L) 13.0 - 17.0 g/dL   HCT 75.3 (L) 60.9 - 47.9 %   MCV 77.6 (L) 80.0 - 100.0 fL   MCH 24.0 (L) 26.0 - 34.0 pg   MCHC 30.9 30.0 - 36.0 g/dL   RDW 80.9 (H) 88.4 - 84.4 %   Platelets 261 150 - 400 K/uL   nRBC 0.0 0.0 - 0.2 %   Neutrophils Relative % 86 %   Neutro Abs 15.7 (H) 1.7 - 7.7 K/uL   Lymphocytes Relative 8 %   Lymphs Abs 1.5 0.7 - 4.0 K/uL   Monocytes Relative 4 %   Monocytes Absolute 0.8 0.1 - 1.0 K/uL   Eosinophils Relative 0 %   Eosinophils Absolute 0.1 0.0 - 0.5 K/uL   Basophils Relative 0 %   Basophils Absolute 0.0 0.0 - 0.1  K/uL   Immature Granulocytes 2 %   Abs Immature Granulocytes 0.29 (H) 0.00 - 0.07 K/uL  Magnesium      Status: None   Collection Time: 03/20/24  4:41 AM  Result  Value Ref Range   Magnesium  2.2 1.7 - 2.4 mg/dL   No results found for this or any previous visit (from the past 240 hours).  Renal Function: Recent Labs    03/17/24 1152 03/18/24 0020 03/20/24 0441  CREATININE 2.06* 1.74* 1.33*   Estimated Creatinine Clearance: 48 mL/min (A) (by C-G formula based on SCr of 1.33 mg/dL (H)).  Radiologic Imaging: No results found.   I independently reviewed the above imaging studies.  Impression/Recommendation Pt is a 78 yo man with PMH AAA with recent type B aortic dissection and intramural aortic hematoma status post endovascular repair September - October 2025, HTN, CKD stage IV, prediabetes, prostate cancer status post radiation therapy in 2014, with recurrence and right Hemi cryoablation prostate 2022 (most recent PSA in 2/25 undetectable) who presented to [facility] ED in clot retention.   He is now doing well POD2 clot evac and fulguration. There appeared to be some shredding of his posterior bladder wall, likely 2/2 clot retention and stretch injury. His urine remains clear yellow for over 24 hours. Given bladder injury, would recommend catheter remain in place for at least 14 days with cystogram prior to removal.   - Ok for discharge from Urology standpoint - Plan for foley to remain in place for 14 days with CT cystogram prior to removal  - Recommend starting mirabegron  25 mg daily to prevent bladder spasms with catheter in place-- patient should stop this at least 48 hours prior to cystogram and catheter removal   Maurilio Agar 03/20/2024, 7:28 AM

## 2024-03-22 ENCOUNTER — Ambulatory Visit (HOSPITAL_COMMUNITY)

## 2024-03-22 LAB — TYPE AND SCREEN
ABO/RH(D): A POS
Antibody Screen: NEGATIVE
Unit division: 0
Unit division: 0
Unit division: 0

## 2024-03-22 LAB — BPAM RBC
Blood Product Expiration Date: 202512192359
Blood Product Expiration Date: 202512192359
Blood Product Expiration Date: 202512192359
ISSUE DATE / TIME: 202511221758
ISSUE DATE / TIME: 202511222055
Unit Type and Rh: 6200
Unit Type and Rh: 6200
Unit Type and Rh: 6200

## 2024-03-29 ENCOUNTER — Telehealth (HOSPITAL_COMMUNITY): Payer: Self-pay | Admitting: *Deleted

## 2024-03-29 ENCOUNTER — Telehealth: Payer: Self-pay

## 2024-03-29 DIAGNOSIS — I10 Essential (primary) hypertension: Secondary | ICD-10-CM

## 2024-03-29 MED ORDER — HYDRALAZINE HCL 50 MG PO TABS: 50.0000 mg | ORAL_TABLET | Freq: Three times a day (TID) | ORAL | 3 refills | Status: AC

## 2024-03-29 MED ORDER — AMLODIPINE BESYLATE 10 MG PO TABS: 10.0000 mg | ORAL_TABLET | Freq: Every day | ORAL | 3 refills | Status: DC

## 2024-03-29 MED ORDER — CARVEDILOL 12.5 MG PO TABS: 12.5000 mg | ORAL_TABLET | Freq: Two times a day (BID) | ORAL | 2 refills | Status: AC

## 2024-03-29 NOTE — Telephone Encounter (Signed)
 I called patient to discuss his concerns over the phone. He was recently admitted with gross hematuria and AKI with CKD. His BP meds were discontinued given anemia requiring pRBCs. He was discharged on coreg  12.5 mg BID, though could not get it from pharmacy. He had resumed taking his BP meds when discharged because his BP started running high. I let him know to re-start amlodipine  10, hydral 50 mg TID, and coreg  12.5 mg BID. No lisinopril  given ongoing gross hematuria and possible recurrence of AKI.  Joelle DEL. Ren Ny, MD [facility]

## 2024-03-29 NOTE — Telephone Encounter (Signed)
 Left message on voicemail per DPR in reference to upcoming appointment scheduled on 04/03/2024 at 10:30 with detailed instructions given per Myocardial Perfusion Study Information Sheet for the test. LM to arrive 15 minutes early, and that it is imperative to arrive on time for appointment to keep from having the test rescheduled. If you need to cancel or reschedule your appointment, please call the office within 24 hours of your appointment. Failure to do so may result in a cancellation of your appointment, and a $50 no show fee. Phone number given for call back for any questions.

## 2024-03-30 ENCOUNTER — Encounter: Payer: Self-pay | Admitting: Vascular Surgery

## 2024-03-30 ENCOUNTER — Ambulatory Visit: Attending: Vascular Surgery | Admitting: Vascular Surgery

## 2024-03-30 VITALS — BP 111/67 | HR 88 | Temp 97.6°F | Resp 18 | Ht 70.0 in | Wt 176.7 lb

## 2024-03-30 DIAGNOSIS — I7123 Aneurysm of the descending thoracic aorta, without rupture: Secondary | ICD-10-CM

## 2024-03-30 DIAGNOSIS — I7100 Dissection of unspecified site of aorta: Secondary | ICD-10-CM

## 2024-03-30 DIAGNOSIS — I7102 Dissection of abdominal aorta: Secondary | ICD-10-CM

## 2024-03-30 NOTE — Progress Notes (Signed)
 Office Note    HPI: Nicholas Burke is a 78 y.o. (08/30/1945) male presenting in follow-up status post TEVAR for descending thoracic intramural hematoma from uncontrolled hypertension now degenerated into the thoracic aneurysm.  He was initially seen in the hospital with intramural hematoma.  This was medically managed, and the patient was Burke, therefore we did not move forward with intervention.  He then grew significantly necessitating TEVAR.  He underwent TEVAR on 02/14/2024.  Completion imaging demonstrated seal.    He was admitted 2 weeks ago with hematuria, and underwent CT chest abdomen pelvis.  There was a type Ia endoleak appreciated.  Furthermore the CT angio abdomen pelvis also demonstrated progression of descending aortic saccular aneurysm and progression of infrarenal intramural hematoma to now focal dissection.   Past Medical History:  Diagnosis Date   AAA (abdominal aortic aneurysm)    Hx of radiation therapy 01/02/13- 02/24/13   prostate 7800 cGy/40 sessions, seminal vesicles 5600 cGy/40 sessions   Hypertension    Pre-diabetes    Prostate cancer (HCC) 08/30/2012   gleason 6, vol 65.7 cc    Past Surgical History:  Procedure Laterality Date   CATARACT EXTRACTION, BILATERAL Bilateral    COLONOSCOPY WITH PROPOFOL  N/A 10/18/2014   Procedure: COLONOSCOPY WITH PROPOFOL ;  Surgeon: [name], MD;  Location: [facility]COPY;  Service: Endoscopy;  Laterality: N/A;   CRYOABLATION N/A 12/24/2020   Procedure: CRYO ABLATION PROSTATE;  Surgeon: [name], MD;  Location: [facility] ORS;  Service: Urology;  Laterality: N/A;   CYSTOSCOPY WITH FULGERATION N/A 03/18/2024   Procedure: CYSTOSCOPY, WITH BLADDER FULGURATION;  Surgeon: Sherrilee Belvie CROME, MD;  Location: [facility] OR[facility];  Service: Urology;  Laterality: N/A;   ESOPHAGOGASTRODUODENOSCOPY N/A 12/20/2023   Procedure: EGD (ESOPHAGOGASTRODUODENOSCOPY);  Surgeon: [name], MD;  Location: [facility] ENDOSCOPY;  Service: Gastroenterology;   Laterality: N/A;   ESOPHAGOGASTRODUODENOSCOPY N/A 12/26/2023   Procedure: EGD (ESOPHAGOGASTRODUODENOSCOPY);  Surgeon: [name], MD;  Location: [facility] ENDOSCOPY;  Service: Gastroenterology;  Laterality: N/A;   EYE SURGERY Bilateral    cataract surgery   HOT HEMOSTASIS N/A 10/18/2014   Procedure: HOT HEMOSTASIS (ARGON PLASMA COAGULATION/BICAP);  Surgeon: [name], MD;  Location: [facility];  Service: Endoscopy;  Laterality: N/A;   PROSTATE BIOPSY  08/2012   THORACIC AORTIC ENDOVASCULAR STENT GRAFT Right 02/14/2024   Procedure: INSERTION, ENDOVASCULAR STENT GRAFT, AORTA, THORACIC;  Surgeon: Lanis Fonda BRAVO, MD;  Location: [facility] OR;  Service: Vascular;  Laterality: Right;   ULTRASOUND GUIDANCE Right 02/14/2024   Procedure: ULTRASOUND GUIDANCE;  Surgeon: Lanis Fonda BRAVO, MD;  Location: [facility] OR;  Service: Vascular;  Laterality: Right;    Social History   Socioeconomic History   Marital status: Married    Spouse name: Not on file   Number of children: 4   Years of education: Not on file   Highest education level: Not on file  Occupational History    Employer: [employer]  Tobacco Use   Smoking status: Never   Smokeless tobacco: Never  Vaping Use   Vaping status: Never Used  Substance and Sexual Activity   Alcohol use: No   Drug use: No   Sexual activity: Not on file  Other Topics Concern   Not on file  Social History Narrative   Not on file   Social Drivers of Health   Financial Resource Strain: Not on file  Food Insecurity: No Food Insecurity (03/17/2024)   Hunger Vital Sign    Worried About Running Out of Food in the Last Year: Never true  Ran Out of Food in the Last Year: Never true  Transportation Needs: No Transportation Needs (03/17/2024)   PRAPARE - Administrator, Civil Service (Medical): No    Lack of Transportation (Non-Medical): No  Physical Activity: Not on file  Stress: Not on file  Social Connections: Socially Integrated (03/17/2024)    Social Connection and Isolation Panel    Frequency of Communication with Friends and Family: Three times a week    Frequency of Social Gatherings with Friends and Family: Three times a week    Attends Religious Services: More than 4 times per year    Active Member of Clubs or Organizations: Yes    Attends Banker Meetings: More than 4 times per year    Marital Status: Married  Catering Manager Violence: Not At Risk (03/17/2024)   Humiliation, Afraid, Rape, and Kick questionnaire    Fear of Current or Ex-Partner: No    Emotionally Abused: No    Physically Abused: No    Sexually Abused: No   Family History  Problem Relation Age of Onset   Prostate cancer Father    Prostate cancer Brother     Current Outpatient Medications  Medication Sig Dispense Refill   acetaminophen  (TYLENOL ) 325 MG tablet Take 2 tablets (650 mg total) by mouth every 6 (six) hours as needed for mild pain (pain score 1-3) or fever (or Fever >/= 101).     albuterol  (VENTOLIN  HFA) 108 (90 Base) MCG/ACT inhaler Inhale 2 puffs into the lungs every 4 (four) hours as needed for wheezing or shortness of breath. (Patient not taking: Reported on 03/17/2024) 6.7 g 0   amLODipine  (NORVASC ) 10 MG tablet Take 1 tablet (10 mg total) by mouth daily. 100 each 3   aspirin  EC 81 MG tablet Take 1 tablet (81 mg total) by mouth daily at 6 (six) AM. Swallow whole.     carvedilol  (COREG ) 12.5 MG tablet Take 1 tablet (12.5 mg total) by mouth 2 (two) times daily with a meal. 100 tablet 2   feeding supplement (ENSURE PLUS HIGH PROTEIN) LIQD Take 237 mLs by mouth 2 (two) times daily between meals. 14220 mL 1   hydrALAZINE  (APRESOLINE ) 50 MG tablet Take 1 tablet (50 mg total) by mouth 3 (three) times daily. 270 tablet 3   mirabegron  ER (MYRBETRIQ ) 25 MG TB24 tablet Take 1 tablet (25 mg total) by mouth daily. 30 tablet 1   oxyCODONE -acetaminophen  (PERCOCET/ROXICET) 5-325 MG tablet Take 1 tablet by mouth every 6 (six) hours as needed  for moderate pain (pain score 4-6). 10 tablet 0   pantoprazole  (PROTONIX ) 40 MG tablet Take 1 tablet (40 mg total) by mouth 2 (two) times daily before a meal. 60 tablet 1   polyethylene glycol powder (GLYCOLAX /MIRALAX ) 17 GM/SCOOP powder Dissolve 1 capful (17g) in 4-8 ounces of liquid and take by mouth daily. (Patient taking differently: Take 17 g by mouth daily as needed for mild constipation.) 238 g 0   rosuvastatin  (CRESTOR ) 5 MG tablet Take 5 mg by mouth every Monday, Wednesday, and Friday at 6 PM.     No current facility-administered medications for this visit.    Allergies  Allergen Reactions   Reglan  [Metoclopramide ] Other (See Comments)    Pt states that it made him shake and had difficulty speaking     REVIEW OF SYSTEMS:  [X]  denotes positive finding, [ ]  denotes negative finding Cardiac  Comments:  Chest pain or chest pressure:    Shortness of breath  upon exertion:    Short of breath when lying flat:    Irregular heart rhythm:        Vascular    Pain in calf, thigh, or hip brought on by ambulation:    Pain in feet at night that wakes you up from your sleep:     Blood clot in your veins:    Leg swelling:         Pulmonary    Oxygen at home:    Productive cough:     Wheezing:         Neurologic    Sudden weakness in arms or legs:     Sudden numbness in arms or legs:     Sudden onset of difficulty speaking or slurred speech:    Temporary loss of vision in one eye:     Problems with dizziness:         Gastrointestinal    Blood in stool:     Vomited blood:         Genitourinary    Burning when urinating:     Blood in urine:        Psychiatric    Major depression:         Hematologic    Bleeding problems:    Problems with blood clotting too easily:        Skin    Rashes or ulcers:        Constitutional    Fever or chills:      PHYSICAL EXAMINATION:  There were no vitals filed for this visit.  General:  WDWN in NAD; vital signs documented  above Gait: Not observed HENT: WNL, normocephalic Pulmonary: normal non-labored breathing , without wheezing Cardiac: regular HR Abdomen: soft, NT, no masses Skin: without rashes Vascular Exam/Pulses:  Right Left  Radial 2+ (normal) 2+ (normal)              DP 2+ (normal) 2+ (normal)       Extremities: without ischemic changes, without Gangrene , without cellulitis; without open wounds;  Musculoskeletal: no muscle wasting or atrophy  Neurologic: A&O X 3;  No focal weakness or paresthesias are detected Psychiatric:  The pt has Normal affect.   Non-Invasive Vascular Imaging:   New CTA demonstrated 5 cm descending thoracic aorta.  This has had significant aneurysmal degeneration over the last month.    ASSESSMENT/PLAN: Nicholas Burke is a 78 y.o. male presenting with descending aorta intramural hematoma degenerated into aneurysm now status post TEVAR for rapid growth.  1 month follow-up imaging demonstrates type Ia endoleak, it appears that the graft likely slid distally due to angulation.  Fortunately, Nicholas Burke.  We had a long discussion regarding his aneurysm, and the need for extension proximally to create seal to decrease pressure to the aneurysm sac.  Without seal, the aneurysm will continue to grow.  I also discussed with him thoracic findings and infrarenal aortic findings.  The infrarenal aorta has degenerated from hematoma to multi luminal dissection.  I do not want this to travel.  My plan would be to place a small aortic cuff at the time of thoracic reintervention to prevent further aneurysmal degeneration/worsening of the dissection.   Fonda FORBES Rim, MD [facility] [phone]

## 2024-03-31 ENCOUNTER — Other Ambulatory Visit: Payer: Self-pay

## 2024-03-31 DIAGNOSIS — R0602 Shortness of breath: Secondary | ICD-10-CM

## 2024-03-31 DIAGNOSIS — I7123 Aneurysm of the descending thoracic aorta, without rupture: Secondary | ICD-10-CM

## 2024-04-03 ENCOUNTER — Encounter (HOSPITAL_COMMUNITY)

## 2024-04-03 ENCOUNTER — Ambulatory Visit (HOSPITAL_COMMUNITY): Admission: RE | Admit: 2024-04-03

## 2024-04-05 NOTE — Progress Notes (Signed)
 Surgical Instructions   Your procedure is scheduled on April 10, 2024. Report to [facility] Main Entrance A at 5:30 A.M., then check in with the Admitting office. Any questions or running late day of surgery: call [phone]  Questions prior to your surgery date: call [phone], Monday-Friday, 8am-4pm. If you experience any cold or flu symptoms such as cough, fever, chills, shortness of breath, etc. between now and your scheduled surgery, please notify us  at the above number.     Remember:  Do not eat or drink after midnight the night before your surgery    Take these medicines the morning of surgery with A SIP OF WATER   amLODipine  (NORVASC )  hydrALAZINE  (APRESOLINE )  mirabegron  ER (MYRBETRIQ )  pantoprazole  (PROTONIX )    May take these medicines IF NEEDED: acetaminophen  (TYLENOL )  albuterol  (VENTOLIN  HFA)  inhaler MAY BRING WITH YOU oxyCODONE -acetaminophen  (PERCOCET/ROXICET)   Aspirin  Follow instructions given to you by your surgeon  One week prior to surgery, STOP taking any Aleve, Naproxen, Ibuprofen , Motrin , Advil , Goody's, BC's, all herbal medications, fish oil, and non-prescription vitamins.                     Do NOT Smoke (Tobacco/Vaping) for 24 hours prior to your procedure.  If you use a CPAP at night, you may bring your mask/headgear for your overnight stay.   You will be asked to remove any contacts, glasses, piercing's, hearing aid's, dentures/partials prior to surgery. Please bring cases for these items if needed.    Patients discharged the day of surgery will not be allowed to drive home, and someone needs to stay with them for 24 hours.  SURGICAL WAITING ROOM VISITATION Patients may have no more than 2 support people in the waiting area - these visitors may rotate.   Pre-op nurse will coordinate an appropriate time for 1 ADULT support person, who may not rotate, to accompany patient in pre-op.  Children under the age of 71 must have an adult with  them who is not the patient and must remain in the main waiting area with an adult.  If the patient needs to stay at the hospital during part of their recovery, the visitor guidelines for inpatient rooms apply.  Please refer to the [facility] website for the visitor guidelines for any additional information.   If you received a COVID test during your pre-op visit  it is requested that you wear a mask when out in public, stay away from anyone that may not be feeling well and notify your surgeon if you develop symptoms. If you have been in contact with anyone that has tested positive in the last 10 days please notify you surgeon.      Pre-operative CHG Bathing Instructions   You can play a key role in reducing the risk of infection after surgery. Your skin needs to be as free of germs as possible. You can reduce the number of germs on your skin by washing with CHG (chlorhexidine  gluconate) soap before surgery. CHG is an antiseptic soap that kills germs and continues to kill germs even after washing.   DO NOT use if you have an allergy to chlorhexidine /CHG or antibacterial soaps. If your skin becomes reddened or irritated, stop using the CHG and notify one of our RNs at [phone].              TAKE A SHOWER THE NIGHT BEFORE SURGERY   Please keep in mind the following:  DO NOT shave, including  legs and underarms, 48 hours prior to surgery.   You may shave your face before/day of surgery.  Place clean sheets on your bed the night before surgery Use a clean washcloth (not used since being washed) for shower. DO NOT sleep with pet's night before surgery.  CHG Shower Instructions:  Wash your face and private area with normal soap. If you choose to wash your hair, wash first with your normal shampoo.  After you use shampoo/soap, rinse your hair and body thoroughly to remove shampoo/soap residue.  Turn the water  OFF and apply half the bottle of CHG soap to a CLEAN washcloth.  Apply CHG soap  ONLY FROM YOUR NECK DOWN TO YOUR TOES (washing for 3-5 minutes)  DO NOT use CHG soap on face, private areas, open wounds, or sores.  Pay special attention to the area where your surgery is being performed.  If you are having back surgery, having someone wash your back for you may be helpful. Wait 2 minutes after CHG soap is applied, then you may rinse off the CHG soap.  Pat dry with a clean towel  Put on clean pajamas    Additional instructions for the day of surgery: If you choose, you may shower the morning of surgery with an antibacterial soap.  DO NOT APPLY any lotions, deodorants, cologne, or perfumes.   Do not wear jewelry or makeup Do not wear nail polish, gel polish, artificial nails, or any other type of covering on natural nails (fingers and toes) Do not bring valuables to the hospital. [facility] is not responsible for valuables/personal belongings. Put on clean/comfortable clothes.  Please brush your teeth.  Ask your nurse before applying any prescription medications to the skin.

## 2024-04-06 ENCOUNTER — Encounter (HOSPITAL_COMMUNITY)
Admission: RE | Admit: 2024-04-06 | Discharge: 2024-04-06 | Disposition: A | Discharge status: Home / Self Care | Source: Ambulatory Visit | Attending: Vascular Surgery | Admitting: Vascular Surgery

## 2024-04-06 ENCOUNTER — Encounter (HOSPITAL_COMMUNITY): Payer: Self-pay

## 2024-04-06 ENCOUNTER — Other Ambulatory Visit: Payer: Self-pay

## 2024-04-06 VITALS — BP 121/77 | HR 96 | Temp 98.0°F | Resp 18 | Ht 70.0 in | Wt 173.2 lb

## 2024-04-06 DIAGNOSIS — I7143 Infrarenal abdominal aortic aneurysm, without rupture: Secondary | ICD-10-CM | POA: Diagnosis present

## 2024-04-06 DIAGNOSIS — Z923 Personal history of irradiation: Secondary | ICD-10-CM | POA: Diagnosis not present

## 2024-04-06 DIAGNOSIS — I351 Nonrheumatic aortic (valve) insufficiency: Secondary | ICD-10-CM | POA: Diagnosis not present

## 2024-04-06 DIAGNOSIS — I7123 Aneurysm of the descending thoracic aorta, without rupture: Secondary | ICD-10-CM

## 2024-04-06 DIAGNOSIS — Z7982 Long term (current) use of aspirin: Secondary | ICD-10-CM | POA: Diagnosis not present

## 2024-04-06 DIAGNOSIS — Z01818 Encounter for other preprocedural examination: Secondary | ICD-10-CM

## 2024-04-06 DIAGNOSIS — R319 Hematuria, unspecified: Secondary | ICD-10-CM | POA: Insufficient documentation

## 2024-04-06 DIAGNOSIS — K8020 Calculus of gallbladder without cholecystitis without obstruction: Secondary | ICD-10-CM | POA: Diagnosis not present

## 2024-04-06 DIAGNOSIS — R918 Other nonspecific abnormal finding of lung field: Secondary | ICD-10-CM | POA: Diagnosis not present

## 2024-04-06 LAB — URINALYSIS, MICROSCOPIC (REFLEX)
RBC / HPF: >50 RBC/hpf (ref 0–5)
WBC, UA: >50 WBC/hpf (ref 0–5)

## 2024-04-06 LAB — URINALYSIS, ROUTINE W REFLEX MICROSCOPIC

## 2024-04-06 LAB — COMPREHENSIVE METABOLIC PANEL WITH GFR
ALT: 11 U/L (ref 0–44)
AST: 20 U/L (ref 15–41)
Albumin: 2.8 g/dL — ABNORMAL LOW (ref 3.5–5.0)
Alkaline Phosphatase: 55 U/L (ref 38–126)
Anion gap: 10 (ref 5–15)
BUN: 18 mg/dL (ref 8–23)
CO2: 27 mmol/L (ref 22–32)
Calcium: 9.2 mg/dL (ref 8.9–10.3)
Chloride: 104 mmol/L (ref 98–111)
Creatinine, Ser: 1.3 mg/dL — ABNORMAL HIGH (ref 0.61–1.24)
GFR, Estimated: 57 mL/min — ABNORMAL LOW (ref >60)
Glucose, Bld: 122 mg/dL — ABNORMAL HIGH (ref 70–99)
Potassium: 3.7 mmol/L (ref 3.5–5.1)
Sodium: 141 mmol/L (ref 135–145)
Total Bilirubin: 1 mg/dL (ref 0.0–1.2)
Total Protein: 7.4 g/dL (ref 6.5–8.1)

## 2024-04-06 LAB — TYPE AND SCREEN
ABO/RH(D): A POS
Antibody Screen: NEGATIVE

## 2024-04-06 LAB — CBC
HCT: 29.6 % — ABNORMAL LOW (ref 39.0–52.0)
Hemoglobin: 8.8 g/dL — ABNORMAL LOW (ref 13.0–17.0)
MCH: 23.4 pg — ABNORMAL LOW (ref 26.0–34.0)
MCHC: 29.7 g/dL — ABNORMAL LOW (ref 30.0–36.0)
MCV: 78.7 fL — ABNORMAL LOW (ref 80.0–100.0)
Platelets: 353 K/uL (ref 150–400)
RBC: 3.76 MIL/uL — ABNORMAL LOW (ref 4.22–5.81)
RDW: 20.1 % — ABNORMAL HIGH (ref 11.5–15.5)
WBC: 8.7 K/uL (ref 4.0–10.5)
nRBC: 0 % (ref 0.0–0.2)

## 2024-04-06 LAB — SURGICAL PCR SCREEN
MRSA, PCR: NEGATIVE
Staphylococcus aureus: NEGATIVE

## 2024-04-06 LAB — APTT: aPTT: 40 s — ABNORMAL HIGH (ref 24–36)

## 2024-04-06 LAB — PROTIME-INR
INR: 1.2 (ref 0.8–1.2)
Prothrombin Time: 16.2 s — ABNORMAL HIGH (ref 11.4–15.2)

## 2024-04-06 NOTE — Progress Notes (Addendum)
 PCP - Milo Costa DO Cardiologist -  Joelle VEAR Ren Donley, MD   PPM/ICD - denies Device Orders -  Rep Notified -   Chest x-ray - 108/25 EKG - 02/02/24 Stress Test - denies ECHO - 12/10/23 Cardiac Cath - denies  Sleep Study -denies  CPAP - no  Fasting Blood Sugar - na Checks Blood Sugar _____ times a day  Last dose of GLP1 agonist-  na GLP1 instructions:   Blood Thinner Instructions:na Aspirin  Instructions:continue per Dr. Aleta instructions  ERAS Protcol -NPO PRE-SURGERY Ensure or G2-   COVID TEST- na   Anesthesia review: yes-recent hospital admit 03/18/24 for gross hematuria. Pt had cystoscopy;indwelling Foley present. Abnormal labs at PAT- Hgb 8.8, Hct 29.6  Patient denies shortness of breath, fever, cough and chest pain at PAT appointment   All instructions explained to the patient, with a verbal understanding of the material. Patient agrees to go over the instructions while at home for a better understanding.  The opportunity to ask questions was provided.

## 2024-04-06 NOTE — Progress Notes (Signed)
 Surgical Instructions   Your procedure is scheduled on April 10, 2024. Report to [facility] Main Entrance A at 5:30 A.M., then check in with the Admitting office. Any questions or running late day of surgery: call [phone]  Questions prior to your surgery date: call [phone], Monday-Friday, 8am-4pm. If you experience any cold or flu symptoms such as cough, fever, chills, shortness of breath, etc. between now and your scheduled surgery, please notify us  at the above number.     Remember:  Do not eat or drink after midnight the night before your surgery    Take these medicines the morning of surgery with A SIP OF WATER   amLODipine  (NORVASC )  hydrALAZINE  (APRESOLINE )  carvedilol  (COREG )  pantoprazole  (PROTONIX )    May take these medicines IF NEEDED: acetaminophen  (TYLENOL )  albuterol  (VENTOLIN  HFA)  inhaler MAY BRING WITH YOU oxyCODONE -acetaminophen  (PERCOCET/ROXICET)   Aspirin  Follow instructions given to you by your surgeon  One week prior to surgery, STOP taking any Aleve, Naproxen, Ibuprofen , Motrin , Advil , Goody's, BC's, all herbal medications, fish oil, and non-prescription vitamins.                     Do NOT Smoke (Tobacco/Vaping) for 24 hours prior to your procedure.  If you use a CPAP at night, you may bring your mask/headgear for your overnight stay.   You will be asked to remove any contacts, glasses, piercing's, hearing aid's, dentures/partials prior to surgery. Please bring cases for these items if needed.    Patients discharged the day of surgery will not be allowed to drive home, and someone needs to stay with them for 24 hours.  SURGICAL WAITING ROOM VISITATION Patients may have no more than 2 support people in the waiting area - these visitors may rotate.   Pre-op nurse will coordinate an appropriate time for 1 ADULT support person, who may not rotate, to accompany patient in pre-op.  Children under the age of 43 must have an adult with them who  is not the patient and must remain in the main waiting area with an adult.  If the patient needs to stay at the hospital during part of their recovery, the visitor guidelines for inpatient rooms apply.  Please refer to the [facility] website for the visitor guidelines for any additional information.   If you received a COVID test during your pre-op visit  it is requested that you wear a mask when out in public, stay away from anyone that may not be feeling well and notify your surgeon if you develop symptoms. If you have been in contact with anyone that has tested positive in the last 10 days please notify you surgeon.      Pre-operative CHG Bathing Instructions   You can play a key role in reducing the risk of infection after surgery. Your skin needs to be as free of germs as possible. You can reduce the number of germs on your skin by washing with CHG (chlorhexidine  gluconate) soap before surgery. CHG is an antiseptic soap that kills germs and continues to kill germs even after washing.   DO NOT use if you have an allergy to chlorhexidine /CHG or antibacterial soaps. If your skin becomes reddened or irritated, stop using the CHG and notify one of our RNs at [phone].              TAKE A SHOWER THE NIGHT BEFORE SURGERY   Please keep in mind the following:  DO NOT shave, including legs  and underarms, 48 hours prior to surgery.   You may shave your face before/day of surgery.  Place clean sheets on your bed the night before surgery Use a clean washcloth (not used since being washed) for shower. DO NOT sleep with pet's night before surgery.  CHG Shower Instructions:  Wash your face and private area with normal soap. If you choose to wash your hair, wash first with your normal shampoo.  After you use shampoo/soap, rinse your hair and body thoroughly to remove shampoo/soap residue.  Turn the water  OFF and apply half the bottle of CHG soap to a CLEAN washcloth.  Apply CHG soap ONLY FROM  YOUR NECK DOWN TO YOUR TOES (washing for 3-5 minutes)  DO NOT use CHG soap on face, private areas, open wounds, or sores.  Pay special attention to the area where your surgery is being performed.  If you are having back surgery, having someone wash your back for you may be helpful. Wait 2 minutes after CHG soap is applied, then you may rinse off the CHG soap.  Pat dry with a clean towel  Put on clean pajamas    Additional instructions for the day of surgery: If you choose, you may shower the morning of surgery with an antibacterial soap.  DO NOT APPLY any lotions, deodorants, cologne, or perfumes.   Do not wear jewelry or makeup Do not wear nail polish, gel polish, artificial nails, or any other type of covering on natural nails (fingers and toes) Do not bring valuables to the hospital. [facility] is not responsible for valuables/personal belongings. Put on clean/comfortable clothes.  Please brush your teeth.  Ask your nurse before applying any prescription medications to the skin.

## 2024-04-07 NOTE — Progress Notes (Signed)
 Anesthesia Chart Review:  Case: [id number] Date/Time: 04/10/24 0715   Procedure: INSERTION, ENDOVASCULAR STENT GRAFT, AORTA, THORACIC   Anesthesia type: General   Diagnosis: Aneurysm of the descending thoracic aorta, without rupture [I71.23]   Pre-op diagnosis: AAA w/o Rupture   [facility]: [facility] ROOM 16 / [facility]   Surgeons: Lanis Fonda BRAVO, MD       DISCUSSION: Patient is a 78 year old male scheduled for the above procedure. S/p type B aortic dissection with intramural aortic hematoma 12/06/2023, initially managed conservatively but repeat imaging to assess for aneurysmal degeneration versus aortic healing showed significant aneurysmal degeneration and underwent TEVAR 02/14/2024. Readmitted 03/17/2024-03/20/2024 for ~ 2 weeks of intermittent hematuria with progression to persistent gross hematuria with abdominal pain. CTA revealed an endoleak with descending thoracic aorta measuring 5.4 cm.  See below for details.    History includes never smoker, HTN, prostate cancer (s/p radiation 2014; s/p right hemi-cryoablation 12/24/2020), prediabetes, type B aortic dissection (with intramural aortic hematoma 12/06/2023, managed conservatively initially but developed aneurysm degeneration, s/p TEVAR 02/14/2024; endo leak noted 03/17/2024 CTA), AAA (3.0 cm infrarenal, 3.8 cm proximal 02/2024), GI bleed (EGD + gastric ulcers, s/p 5 units PRBC, PPI 11/2023), gross hematuria (s/p clot evacuation, fulguration of bladder well 03/18/2024).    Prior to TEVAR he had a preoperative cardiology evaluation by Dr. Joelle Cedars Tonleu on 02/02/2024. He reported on-going SOB following 11/2023 admission for aortic dissection/intramural hematoma with significant anemia. No chest pain. Reported ability to walk up a flight of stairs. 12/10/2023 TTE showed LVEF 55-60%, no RWMA, normal RV systolic function, trivial MR, mild AR, mild TR. Clinically did not appear volume overloaded, but BNP 192.7 and given a short course of Lasix . CXR without  acute findings. BP medications adjusted. Follow-up in one month planned. In regards to TEVAR CV risk assessment he wrote, He is presenting for preop evaluation for TEVAR.  He is able to walk up a flight of stairs and had recent TTE with normal EF. He is intermediate risk for surgery but no additional evaluation needed at this time. At 03/03/2024 follow-up he reported dyspnea since discharge. No chest pain. Stress test  discussed, but then found to be anemic with H/H 7.5/25.6 (nadir 5.7/19.6) and was admitted for symptomatic anemia with gross hematuria. S/p 2 units PRBC. He underwent cystoscopy, clot evacuation and fulguration of the bleeding from posterior bladder wall on 03/18/2024. CTA revealed an endoleak along the proximal margin of the endograft with contrast filling the intramural hematoma, so out-patient vascular surgery follow-up recommended. Anemia likely at least contributing to dyspnea. He did not have the stress test. He is still anemic (H/H 8.8/29.6), but improved since November. He has cardiology follow-up scheduled for 05/03/2023.  He denied chest pain and SOB per PAT RN interview. He is on ASA, b-blocker, and statin therapy. Also on amlodipine  and hydralazine .    Per 03/30/2024 follow-up with Dr. Lanis, 1 month follow-up imaging demonstrates type Ia endoleak, it appears that the graft likely slid distally due to angulation.  Fortunately, Audra remains asymptomatic.  We had a long discussion regarding his aneurysm, and the need for extension proximally to create seal to decrease pressure to the aneurysm sac.  Without seal, the aneurysm will continue to grow.    I also discussed with him thoracic findings and infrarenal aortic findings.  The infrarenal aorta has degenerated from hematoma to multi luminal dissection.  I do not want this to travel.  My plan would be to place a small aortic cuff at  the time of thoracic reintervention to prevent further aneurysmal degeneration/worsening of the  dissection.   He continues to have an indwelling foley catheter.  Most of UA was not done due to presence of hematuria--microscopic showed > 50 RBC & WBC, few bacteria. H/H 8.8/29.6 on 04/06/2024 (up from 7.6/24.6 on 03/20/2024). His H/H have been variable since August. When admitted initially for type B aortic dissection on 12/06/2023 his hemoglobin was 15, but developed extensive intramural hematoma with HGB down to 5.2 during that admission. Prior to TEVAR HGB was up to 10.1 with post-operative HGB of 8.8-9.9.  When admitted for hematuria last month, HGB nadir was 5.7 but was up to 7.6 by 03/20/2024 discharge following 2 units PRBC. H/H 8.8/29.6 per 04/06/2024 PAT labs. Dr. Lanis marked labs as reviewed.   Above reviewed with anesthesiologist Leopoldo Bruckner, MD. Recent TEVAR and now with endoleak. Vascular has recommended repair. He remains anemic, but improving. T&S done with labs. Defer decision for PRBC to surgeon and/or anesthesiologist. Anesthesia team to evaluate on the day of surgery.    VS: BP 121/77   Pulse 96   Temp 36.7 C (Oral)   Resp 18   Ht 5' 10 (1.778 m)   Wt 78.6 kg   SpO2 100%   BMI 24.85 kg/m    PROVIDERS: Auston Opal, DO is PCP Donley Prader, MD is cardiologist Lanis Chew, MD is vascular surgeon   LABS: Preoperative labs noted. Cr 1.30, consistent with previous result. See DISCUSSION regarding anemia and UA. T&S done with PAT labs.(all labs ordered are listed, but only abnormal results are displayed)  Labs Reviewed  CBC - Abnormal; Notable for the following components:      Result Value   RBC 3.76 (*)    Hemoglobin 8.8 (*)    HCT 29.6 (*)    MCV 78.7 (*)    MCH 23.4 (*)    MCHC 29.7 (*)    RDW 20.1 (*)    All other components within normal limits  COMPREHENSIVE METABOLIC PANEL WITH GFR - Abnormal; Notable for the following components:   Glucose, Bld 122 (*)    Creatinine, Ser 1.30 (*)    Albumin  2.8 (*)    GFR, Estimated 57 (*)    All  other components within normal limits  PROTIME-INR - Abnormal; Notable for the following components:   Prothrombin Time 16.2 (*)    All other components within normal limits  APTT - Abnormal; Notable for the following components:   aPTT 40 (*)    All other components within normal limits  URINALYSIS, ROUTINE W REFLEX MICROSCOPIC - Abnormal; Notable for the following components:   Color, Urine RED (*)    APPearance TURBID (*)    Glucose, UA   (*)    Value: TEST NOT REPORTED DUE TO COLOR INTERFERENCE OF URINE PIGMENT   Hgb urine dipstick   (*)    Value: TEST NOT REPORTED DUE TO COLOR INTERFERENCE OF URINE PIGMENT   Bilirubin Urine   (*)    Value: TEST NOT REPORTED DUE TO COLOR INTERFERENCE OF URINE PIGMENT   Ketones, ur   (*)    Value: TEST NOT REPORTED DUE TO COLOR INTERFERENCE OF URINE PIGMENT   Protein, ur   (*)    Value: TEST NOT REPORTED DUE TO COLOR INTERFERENCE OF URINE PIGMENT   Nitrite   (*)    Value: TEST NOT REPORTED DUE TO COLOR INTERFERENCE OF URINE PIGMENT   Leukocytes,Ua   (*)  Value: TEST NOT REPORTED DUE TO COLOR INTERFERENCE OF URINE PIGMENT   All other components within normal limits  URINALYSIS, MICROSCOPIC (REFLEX) - Abnormal; Notable for the following components:   Bacteria, UA FEW (*)    All other components within normal limits  SURGICAL PCR SCREEN  TYPE AND SCREEN    OTHER:  Upper Endoscopy Reviewed 01/05/2024 (as outlined in [facility] Everywhere): Interpretation:no H. pylori, no intestinal metaplasia, he had gastric ulcers but they were related to NG tube trauma, no repeat EGD needed, reassurance. Performing Lab: Notes/Report: no H. pylori, no intestinal metaplasia, he had gastric ulcers but they were related to NG tube trauma, no repeat EGD needed, reassurance.      IMAGES: CTA chest/abd/pelvis 03/17/2024: IMPRESSION: Vascular 1. New descending thoracic aortic graft in place.  The descending thoracic aorta is dilated measuring 5.4 cm. There is an  endoleak along the proximal margin of the endograft with contrast filling the intramural hematoma. 2. Stable aneurysmal dilatation of the proximal abdominal aorta at the level of the diaphragm measuring 3.8 cm. 3. Stable infrarenal abdominal aortic aneurysm measuring 3.0 cm. Focal dissection or region of leakage of contrast into the intramural hematoma has increased at this level. Nonvascular 1. Heterogeneous hyperdensity filling the majority of the bladder with hyperdense fluid fluid level favored as hematoma. There is mild stranding surrounding the bladder. Underlying bladder mass is not excluded. 2. Mild patchy ground-glass opacities in both lower lobes, left greater than right, likely infectious/inflammatory. 3. Cholelithiasis. 4. Borderline dilated loops of jejunum with air-fluid levels in the upper abdomen without definite transition point. Findings may represent ileus.   CXR 02/14/2024: FINDINGS: Interval descending thoracic aorta stent. No pneumothorax or pneumomediastinum. Poor inspiration with crowding of the lung markings. The lungs are grossly clear. Thoracic spine degenerative changes. IMPRESSION: 1. Interval descending thoracic aorta stent. 2. Poor inspiration with crowding of the lung markings.    EKG: 02/02/2024: Normal sinus rhythm When compared with ECG of 06-Dec-2023 10:01, No significant change was found Confirmed by Ren Donley Prader ([id number]) on 02/02/2024 8:49:00 AM     CV: Echo 12/10/2023: IMPRESSIONS   1. Left ventricular ejection fraction, by estimation, is 55 to 60%. The  left ventricle has normal function. The left ventricle has no regional  wall motion abnormalities. Left ventricular diastolic parameters are  indeterminate.   2. Right ventricular systolic function is normal. The right ventricular  size is normal.   3. The mitral valve is normal in structure. Trivial mitral valve  regurgitation.   4. The aortic valve is tricuspid. Aortic valve  regurgitation is mild.    ETT 05/05/2019: Blood pressure demonstrated a normal response to exercise. There was no ST segment deviation noted during stress. Clinically and electrically negative for ischemia     Past Medical History:  Diagnosis Date   AAA (abdominal aortic aneurysm)    Hx of radiation therapy 01/02/13- 02/24/13   prostate 7800 cGy/40 sessions, seminal vesicles 5600 cGy/40 sessions   Hypertension    Pre-diabetes    Prostate cancer (HCC) 08/30/2012   gleason 6, vol 65.7 cc    Past Surgical History:  Procedure Laterality Date   CATARACT EXTRACTION, BILATERAL Bilateral    COLONOSCOPY WITH PROPOFOL  N/A 10/18/2014   Procedure: COLONOSCOPY WITH PROPOFOL ;  Surgeon: [name], MD;  Location: [facility] ENDOSCOPY;  Service: Endoscopy;  Laterality: N/A;   CRYOABLATION N/A 12/24/2020   Procedure: CRYO ABLATION PROSTATE;  Surgeon: [name], MD;  Location: [facility] ORS;  Service: Urology;  Laterality: N/A;  CYSTOSCOPY WITH FULGERATION N/A 03/18/2024   Procedure: CYSTOSCOPY, WITH BLADDER FULGURATION;  Surgeon: Sherrilee Belvie CROME, MD;  Location: [facility] ORS;  Service: Urology;  Laterality: N/A;   ESOPHAGOGASTRODUODENOSCOPY N/A 12/20/2023   Procedure: EGD (ESOPHAGOGASTRODUODENOSCOPY);  Surgeon: [name], MD;  Location: [facility] ENDOSCOPY;  Service: Gastroenterology;  Laterality: N/A;   ESOPHAGOGASTRODUODENOSCOPY N/A 12/26/2023   Procedure: EGD (ESOPHAGOGASTRODUODENOSCOPY);  Surgeon: [name], MD;  Location: [facility] ENDOSCOPY;  Service: Gastroenterology;  Laterality: N/A;   EYE SURGERY Bilateral    cataract surgery   HERNIA REPAIR     HOT HEMOSTASIS N/A 10/18/2014   Procedure: HOT HEMOSTASIS (ARGON PLASMA COAGULATION/BICAP);  Surgeon: [name], MD;  Location: [facility] ENDOSCOPY;  Service: Endoscopy;  Laterality: N/A;   PROSTATE BIOPSY  08/25/2012   THORACIC AORTIC ENDOVASCULAR STENT GRAFT Right 02/14/2024   Procedure: INSERTION, ENDOVASCULAR STENT GRAFT, AORTA, THORACIC;  Surgeon:  Lanis Fonda BRAVO, MD;  Location: [facility] OR;  Service: Vascular;  Laterality: Right;   ULTRASOUND GUIDANCE Right 02/14/2024   Procedure: ULTRASOUND GUIDANCE;  Surgeon: Lanis Fonda BRAVO, MD;  Location[facility] OR;  Service: Vascular;  Laterality: Right;    MEDICATIONS:  acetaminophen  (TYLENOL ) 325 MG tablet   albuterol  (VENTOLIN  HFA) 108 (90 Base) MCG/ACT inhaler   amLODipine  (NORVASC ) 10 MG tablet   aspirin  EC 81 MG tablet   carvedilol  (COREG ) 12.5 MG tablet   feeding supplement (ENSURE PLUS HIGH PROTEIN) LIQD   finasteride  (PROSCAR ) 5 MG tablet   hydrALAZINE  (APRESOLINE ) 50 MG tablet   mirabegron  ER (MYRBETRIQ ) 25 MG TB24 tablet   oxyCODONE -acetaminophen  (PERCOCET/ROXICET) 5-325 MG tablet   pantoprazole  (PROTONIX ) 40 MG tablet   polyethylene glycol powder (GLYCOLAX /MIRALAX ) 17 GM/SCOOP powder   rosuvastatin  (CRESTOR ) 5 MG tablet   No current facility-administered medications for this encounter.    Isaiah Ruder, PA-C Surgical Short Stay/Anesthesiology [facility] Phone [phone] [facility] Phone [phone] 04/07/2024 1:45 PM

## 2024-04-07 NOTE — Anesthesia Preprocedure Evaluation (Signed)
 Anesthesia Evaluation  Patient identified by MRN, date of birth, ID band Patient awake    Reviewed: Allergy & Precautions, H&P , NPO status , Patient's Chart, lab work & pertinent test results  Airway Mallampati: II   Neck ROM: full    Dental   Pulmonary neg pulmonary ROS   breath sounds clear to auscultation       Cardiovascular hypertension, + Peripheral Vascular Disease   Rhythm:regular Rate:Normal  AAA   Neuro/Psych    GI/Hepatic   Endo/Other  diabetes, Type 2    Renal/GU      Musculoskeletal   Abdominal   Peds  Hematology   Anesthesia Other Findings   Reproductive/Obstetrics                              Anesthesia Physical Anesthesia Plan  ASA: 3  Anesthesia Plan: General   Post-op Pain Management:    Induction: Intravenous  PONV Risk Score and Plan: 2 and Ondansetron , Dexamethasone , Midazolam  and Treatment may vary due to age or medical condition  Airway Management Planned: Oral ETT  Additional Equipment: Arterial line  Intra-op Plan:   Post-operative Plan:   Informed Consent: I have reviewed the patients History and Physical, chart, labs and discussed the procedure including the risks, benefits and alternatives for the proposed anesthesia with the patient or authorized representative who has indicated his/her understanding and acceptance.     Dental advisory given  Plan Discussed with: CRNA, Anesthesiologist and Surgeon  Anesthesia Plan Comments: (PAT note written 04/07/2024 by Allison Zelenak, PA-C.  )         Anesthesia Quick Evaluation

## 2024-04-10 ENCOUNTER — Other Ambulatory Visit: Payer: Self-pay

## 2024-04-10 ENCOUNTER — Encounter (HOSPITAL_COMMUNITY): Payer: Self-pay | Admitting: Vascular Surgery

## 2024-04-10 ENCOUNTER — Inpatient Hospital Stay (HOSPITAL_COMMUNITY)

## 2024-04-10 ENCOUNTER — Other Ambulatory Visit (HOSPITAL_COMMUNITY): Payer: Self-pay

## 2024-04-10 ENCOUNTER — Inpatient Hospital Stay (HOSPITAL_COMMUNITY)
Admission: RE | Admit: 2024-04-10 | Discharge: 2024-04-11 | DRG: 219 | Disposition: A | Attending: Vascular Surgery | Admitting: Vascular Surgery

## 2024-04-10 ENCOUNTER — Inpatient Hospital Stay (HOSPITAL_COMMUNITY): Admitting: Physician Assistant

## 2024-04-10 ENCOUNTER — Encounter: Admission: RE | Disposition: A | Payer: Self-pay | Attending: Vascular Surgery

## 2024-04-10 DIAGNOSIS — I7102 Dissection of abdominal aorta: Secondary | ICD-10-CM | POA: Diagnosis not present

## 2024-04-10 DIAGNOSIS — I714 Abdominal aortic aneurysm, without rupture, unspecified: Secondary | ICD-10-CM | POA: Diagnosis not present

## 2024-04-10 DIAGNOSIS — I71012 Dissection of descending thoracic aorta: Secondary | ICD-10-CM | POA: Diagnosis present

## 2024-04-10 DIAGNOSIS — I10 Essential (primary) hypertension: Secondary | ICD-10-CM

## 2024-04-10 DIAGNOSIS — Z923 Personal history of irradiation: Secondary | ICD-10-CM | POA: Diagnosis not present

## 2024-04-10 DIAGNOSIS — D62 Acute posthemorrhagic anemia: Secondary | ICD-10-CM | POA: Diagnosis not present

## 2024-04-10 DIAGNOSIS — T82330A Leakage of aortic (bifurcation) graft (replacement), initial encounter: Secondary | ICD-10-CM

## 2024-04-10 DIAGNOSIS — T82310A Breakdown (mechanical) of aortic (bifurcation) graft (replacement), initial encounter: Secondary | ICD-10-CM | POA: Diagnosis present

## 2024-04-10 DIAGNOSIS — Z95828 Presence of other vascular implants and grafts: Principal | ICD-10-CM

## 2024-04-10 DIAGNOSIS — Z9841 Cataract extraction status, right eye: Secondary | ICD-10-CM | POA: Diagnosis not present

## 2024-04-10 DIAGNOSIS — R319 Hematuria, unspecified: Secondary | ICD-10-CM | POA: Diagnosis present

## 2024-04-10 DIAGNOSIS — I7123 Aneurysm of the descending thoracic aorta, without rupture: Secondary | ICD-10-CM | POA: Diagnosis present

## 2024-04-10 DIAGNOSIS — Y712 Prosthetic and other implants, materials and accessory cardiovascular devices associated with adverse incidents: Secondary | ICD-10-CM | POA: Diagnosis present

## 2024-04-10 DIAGNOSIS — Z8042 Family history of malignant neoplasm of prostate: Secondary | ICD-10-CM | POA: Diagnosis not present

## 2024-04-10 DIAGNOSIS — Z9889 Other specified postprocedural states: Secondary | ICD-10-CM

## 2024-04-10 DIAGNOSIS — R7303 Prediabetes: Secondary | ICD-10-CM | POA: Diagnosis present

## 2024-04-10 DIAGNOSIS — Z8546 Personal history of malignant neoplasm of prostate: Secondary | ICD-10-CM | POA: Diagnosis not present

## 2024-04-10 DIAGNOSIS — Z9842 Cataract extraction status, left eye: Secondary | ICD-10-CM | POA: Diagnosis not present

## 2024-04-10 DIAGNOSIS — Z888 Allergy status to other drugs, medicaments and biological substances status: Secondary | ICD-10-CM | POA: Diagnosis not present

## 2024-04-10 HISTORY — PX: THORACIC AORTIC ENDOVASCULAR STENT GRAFT: SHX6112

## 2024-04-10 LAB — CBC
HCT: 25.4 % — ABNORMAL LOW (ref 39.0–52.0)
Hemoglobin: 7.7 g/dL — ABNORMAL LOW (ref 13.0–17.0)
MCH: 23.3 pg — ABNORMAL LOW (ref 26.0–34.0)
MCHC: 30.3 g/dL (ref 30.0–36.0)
MCV: 77 fL — ABNORMAL LOW (ref 80.0–100.0)
Platelets: 309 K/uL (ref 150–400)
RBC: 3.3 MIL/uL — ABNORMAL LOW (ref 4.22–5.81)
RDW: 19.3 % — ABNORMAL HIGH (ref 11.5–15.5)
WBC: 8.1 K/uL (ref 4.0–10.5)
nRBC: 0 % (ref 0.0–0.2)

## 2024-04-10 LAB — APTT: aPTT: 47 s — ABNORMAL HIGH (ref 24–36)

## 2024-04-10 LAB — BASIC METABOLIC PANEL WITH GFR
Anion gap: 8 (ref 5–15)
BUN: 13 mg/dL (ref 8–23)
CO2: 24 mmol/L (ref 22–32)
Calcium: 8.2 mg/dL — ABNORMAL LOW (ref 8.9–10.3)
Chloride: 108 mmol/L (ref 98–111)
Creatinine, Ser: 1.05 mg/dL (ref 0.61–1.24)
GFR, Estimated: >60 mL/min (ref >60)
Glucose, Bld: 123 mg/dL — ABNORMAL HIGH (ref 70–99)
Potassium: 3.7 mmol/L (ref 3.5–5.1)
Sodium: 140 mmol/L (ref 135–145)

## 2024-04-10 SURGERY — INSERTION, ENDOVASCULAR STENT GRAFT, AORTA, THORACIC
Anesthesia: General

## 2024-04-10 MED ORDER — ONDANSETRON HCL 4 MG/2ML IJ SOLN: 4.0000 mg | Freq: Four times a day (QID) | INTRAMUSCULAR | Status: DC | PRN

## 2024-04-10 MED ORDER — CEFAZOLIN SODIUM-DEXTROSE 2-4 GM/100ML-% IV SOLN
2.0000 g | Freq: Three times a day (TID) | INTRAVENOUS | Status: AC
  Administered 2024-04-10 – 2024-04-11 (×2): 2 g via INTRAVENOUS
  Filled 2024-04-10 (×2): qty 100

## 2024-04-10 MED ORDER — OXYCODONE-ACETAMINOPHEN 5-325 MG PO TABS: 1.0000 | ORAL_TABLET | ORAL | Status: DC | PRN

## 2024-04-10 MED ORDER — FENTANYL CITRATE (PF) 100 MCG/2ML IJ SOLN: 25.0000 ug | INTRAMUSCULAR | Status: DC | PRN

## 2024-04-10 MED ORDER — AMLODIPINE BESYLATE 10 MG PO TABS
10.0000 mg | ORAL_TABLET | Freq: Every day | ORAL | Status: DC
  Administered 2024-04-11: 08:00:00 10 mg via ORAL
  Filled 2024-04-10: qty 1

## 2024-04-10 MED ORDER — PHENYLEPHRINE 80 MCG/ML (10ML) SYRINGE FOR IV PUSH (FOR BLOOD PRESSURE SUPPORT)
PREFILLED_SYRINGE | INTRAVENOUS | Status: DC | PRN
  Administered 2024-04-10: 08:00:00 160 ug via INTRAVENOUS
  Administered 2024-04-10 (×4): 80 ug via INTRAVENOUS

## 2024-04-10 MED ORDER — ASPIRIN 81 MG PO TBEC
81.0000 mg | DELAYED_RELEASE_TABLET | Freq: Every day | ORAL | Status: DC
  Administered 2024-04-11: 06:00:00 81 mg via ORAL
  Filled 2024-04-10: qty 1

## 2024-04-10 MED ORDER — CHLORHEXIDINE GLUCONATE CLOTH 2 % EX PADS
6.0000 | MEDICATED_PAD | Freq: Every day | CUTANEOUS | Status: DC
  Administered 2024-04-10 – 2024-04-11 (×2): 6 via TOPICAL

## 2024-04-10 MED ORDER — HEPARIN 6000 UNIT IRRIGATION SOLUTION
Status: AC
  Filled 2024-04-10: qty 500

## 2024-04-10 MED ORDER — HEPARIN 6000 UNIT IRRIGATION SOLUTION
Status: DC | PRN
  Administered 2024-04-10: 09:00:00 1

## 2024-04-10 MED ORDER — SUGAMMADEX SODIUM 200 MG/2ML IV SOLN
INTRAVENOUS | Status: AC
  Filled 2024-04-10: qty 2

## 2024-04-10 MED ORDER — LIDOCAINE 2% (20 MG/ML) 5 ML SYRINGE
INTRAMUSCULAR | Status: AC
  Filled 2024-04-10: qty 5

## 2024-04-10 MED ORDER — CHLORHEXIDINE GLUCONATE 0.12 % MT SOLN
15.0000 mL | Freq: Once | OROMUCOSAL | Status: AC
  Administered 2024-04-10: 07:00:00 15 mL via OROMUCOSAL
  Filled 2024-04-10: qty 15

## 2024-04-10 MED ORDER — ONDANSETRON HCL 4 MG/2ML IJ SOLN
INTRAMUSCULAR | Status: DC | PRN
  Administered 2024-04-10: 08:00:00 4 mg via INTRAVENOUS

## 2024-04-10 MED ORDER — ONDANSETRON HCL 4 MG/2ML IJ SOLN
INTRAMUSCULAR | Status: AC
  Filled 2024-04-10: qty 2

## 2024-04-10 MED ORDER — ACETAMINOPHEN 325 MG PO TABS: 325.0000 mg | ORAL_TABLET | ORAL | Status: DC | PRN

## 2024-04-10 MED ORDER — OXYCODONE HCL 5 MG/5ML PO SOLN: 5.0000 mg | Freq: Once | ORAL | Status: DC | PRN

## 2024-04-10 MED ORDER — PROTAMINE SULFATE 10 MG/ML IV SOLN
INTRAVENOUS | Status: DC | PRN
  Administered 2024-04-10: 09:00:00 50 mg via INTRAVENOUS

## 2024-04-10 MED ORDER — ROCURONIUM BROMIDE 10 MG/ML (PF) SYRINGE
PREFILLED_SYRINGE | INTRAVENOUS | Status: AC
  Filled 2024-04-10: qty 10

## 2024-04-10 MED ORDER — CHLORHEXIDINE GLUCONATE CLOTH 2 % EX PADS: 6.0000 | MEDICATED_PAD | Freq: Once | CUTANEOUS | Status: DC

## 2024-04-10 MED ORDER — PHENYLEPHRINE HCL-NACL 20-0.9 MG/250ML-% IV SOLN
INTRAVENOUS | Status: DC | PRN
  Administered 2024-04-10: 08:00:00 30 ug/min via INTRAVENOUS

## 2024-04-10 MED ORDER — CEFAZOLIN SODIUM-DEXTROSE 2-4 GM/100ML-% IV SOLN
2.0000 g | INTRAVENOUS | Status: AC
  Administered 2024-04-10: 08:00:00 2 g via INTRAVENOUS
  Filled 2024-04-10: qty 100

## 2024-04-10 MED ORDER — SODIUM CHLORIDE 0.9 % IV SOLN: INTRAVENOUS | Status: DC

## 2024-04-10 MED ORDER — PHENYLEPHRINE 80 MCG/ML (10ML) SYRINGE FOR IV PUSH (FOR BLOOD PRESSURE SUPPORT)
PREFILLED_SYRINGE | INTRAVENOUS | Status: AC
  Filled 2024-04-10: qty 10

## 2024-04-10 MED ORDER — ROSUVASTATIN CALCIUM 5 MG PO TABS
5.0000 mg | ORAL_TABLET | ORAL | Status: DC
  Administered 2024-04-10: 17:00:00 5 mg via ORAL
  Filled 2024-04-10: qty 1

## 2024-04-10 MED ORDER — HYDRALAZINE HCL 50 MG PO TABS
50.0000 mg | ORAL_TABLET | Freq: Three times a day (TID) | ORAL | Status: DC
  Filled 2024-04-10: qty 1

## 2024-04-10 MED ORDER — FINASTERIDE 5 MG PO TABS
5.0000 mg | ORAL_TABLET | Freq: Every day | ORAL | Status: DC
  Administered 2024-04-11: 08:00:00 5 mg via ORAL
  Filled 2024-04-10: qty 1

## 2024-04-10 MED ORDER — PROPOFOL 10 MG/ML IV BOLUS
INTRAVENOUS | Status: AC
  Filled 2024-04-10: qty 20

## 2024-04-10 MED ORDER — ROCURONIUM BROMIDE 10 MG/ML (PF) SYRINGE
PREFILLED_SYRINGE | INTRAVENOUS | Status: DC | PRN
  Administered 2024-04-10: 08:00:00 50 mg via INTRAVENOUS
  Administered 2024-04-10: 09:00:00 15 mg via INTRAVENOUS

## 2024-04-10 MED ORDER — OXYCODONE HCL 5 MG PO TABS: 5.0000 mg | ORAL_TABLET | Freq: Once | ORAL | Status: DC | PRN

## 2024-04-10 MED ORDER — BISACODYL 10 MG RE SUPP: 10.0000 mg | Freq: Every day | RECTAL | Status: DC | PRN

## 2024-04-10 MED ORDER — IODIXANOL 320 MG/ML IV SOLN
INTRAVENOUS | Status: DC | PRN
  Administered 2024-04-10: 09:00:00 61 mL via INTRA_ARTERIAL

## 2024-04-10 MED ORDER — ALBUTEROL SULFATE (2.5 MG/3ML) 0.083% IN NEBU: 3.0000 mL | INHALATION_SOLUTION | RESPIRATORY_TRACT | Status: DC | PRN

## 2024-04-10 MED ORDER — METOPROLOL TARTRATE 5 MG/5ML IV SOLN: 2.5000 mg | INTRAVENOUS | Status: DC | PRN

## 2024-04-10 MED ORDER — SODIUM CHLORIDE 0.9 % IV SOLN: INTRAVENOUS | Status: AC

## 2024-04-10 MED ORDER — DEXAMETHASONE SOD PHOSPHATE PF 10 MG/ML IJ SOLN
INTRAMUSCULAR | Status: DC | PRN
  Administered 2024-04-10: 08:00:00 8 mg via INTRAVENOUS

## 2024-04-10 MED ORDER — POTASSIUM CHLORIDE CRYS ER 20 MEQ PO TBCR: 40.0000 meq | EXTENDED_RELEASE_TABLET | Freq: Every day | ORAL | Status: DC | PRN

## 2024-04-10 MED ORDER — HYDROMORPHONE HCL 1 MG/ML IJ SOLN: 0.5000 mg | INTRAMUSCULAR | Status: DC | PRN

## 2024-04-10 MED ORDER — PROPOFOL 10 MG/ML IV BOLUS
INTRAVENOUS | Status: DC | PRN
  Administered 2024-04-10: 08:00:00 160 mg via INTRAVENOUS

## 2024-04-10 MED ORDER — ENSURE PLUS HIGH PROTEIN PO LIQD: 237.0000 mL | Freq: Two times a day (BID) | ORAL | Status: DC

## 2024-04-10 MED ORDER — FENTANYL CITRATE (PF) 250 MCG/5ML IJ SOLN
INTRAMUSCULAR | Status: AC
  Filled 2024-04-10: qty 5

## 2024-04-10 MED ORDER — 0.9 % SODIUM CHLORIDE (POUR BTL) OPTIME
TOPICAL | Status: DC | PRN
  Administered 2024-04-10: 09:00:00 1000 mL

## 2024-04-10 MED ORDER — CARVEDILOL 12.5 MG PO TABS
12.5000 mg | ORAL_TABLET | Freq: Two times a day (BID) | ORAL | Status: DC
  Administered 2024-04-10 – 2024-04-11 (×2): 12.5 mg via ORAL
  Filled 2024-04-10 (×2): qty 1

## 2024-04-10 MED ORDER — LIDOCAINE HCL (PF) 2 % IJ SOLN
INTRAMUSCULAR | Status: DC | PRN
  Administered 2024-04-10: 08:00:00 60 mg via INTRADERMAL

## 2024-04-10 MED ORDER — DOCUSATE SODIUM 100 MG PO CAPS
100.0000 mg | ORAL_CAPSULE | Freq: Every day | ORAL | Status: DC
  Filled 2024-04-10: qty 1

## 2024-04-10 MED ORDER — HEPARIN SODIUM (PORCINE) 1000 UNIT/ML IJ SOLN
INTRAMUSCULAR | Status: DC | PRN
  Administered 2024-04-10: 09:00:00 8000 [IU] via INTRAVENOUS
  Administered 2024-04-10 (×2): 2000 [IU] via INTRAVENOUS

## 2024-04-10 MED ORDER — SODIUM CHLORIDE 0.9 % IV BOLUS: 500.0000 mL | Freq: Once | INTRAVENOUS | Status: DC | PRN

## 2024-04-10 MED ORDER — SUGAMMADEX SODIUM 200 MG/2ML IV SOLN
INTRAVENOUS | Status: DC | PRN
  Administered 2024-04-10: 09:00:00 200 mg via INTRAVENOUS

## 2024-04-10 MED ORDER — ORAL CARE MOUTH RINSE: 15.0000 mL | Freq: Once | OROMUCOSAL | Status: AC

## 2024-04-10 MED ORDER — EPHEDRINE SULFATE-NACL 50-0.9 MG/10ML-% IV SOSY
PREFILLED_SYRINGE | INTRAVENOUS | Status: DC | PRN
  Administered 2024-04-10: 09:00:00 10 mg via INTRAVENOUS
  Administered 2024-04-10 (×2): 7.5 mg via INTRAVENOUS

## 2024-04-10 MED ORDER — ACETAMINOPHEN 650 MG RE SUPP: 325.0000 mg | RECTAL | Status: DC | PRN

## 2024-04-10 MED ORDER — EPHEDRINE 5 MG/ML INJ
INTRAVENOUS | Status: AC
  Filled 2024-04-10: qty 5

## 2024-04-10 MED ORDER — POLYETHYLENE GLYCOL 3350 17 G PO PACK: 17.0000 g | PACK | Freq: Every day | ORAL | Status: DC | PRN

## 2024-04-10 MED ORDER — PHENOL 1.4 % MT LIQD: 1.0000 | OROMUCOSAL | Status: DC | PRN

## 2024-04-10 MED ORDER — LACTATED RINGERS IV SOLN: INTRAVENOUS | Status: DC | PRN

## 2024-04-10 MED ORDER — HEPARIN SODIUM (PORCINE) 5000 UNIT/ML IJ SOLN
5000.0000 [IU] | Freq: Three times a day (TID) | INTRAMUSCULAR | Status: DC
  Administered 2024-04-11: 06:00:00 5000 [IU] via SUBCUTANEOUS
  Filled 2024-04-10: qty 1

## 2024-04-10 MED ORDER — PANTOPRAZOLE SODIUM 40 MG PO TBEC
40.0000 mg | DELAYED_RELEASE_TABLET | Freq: Every day | ORAL | Status: DC
  Administered 2024-04-11: 08:00:00 40 mg via ORAL
  Filled 2024-04-10: qty 1

## 2024-04-10 MED ORDER — FENTANYL CITRATE (PF) 250 MCG/5ML IJ SOLN
INTRAMUSCULAR | Status: DC | PRN
  Administered 2024-04-10: 08:00:00 100 ug via INTRAVENOUS

## 2024-04-10 SURGICAL SUPPLY — 39 items
BAG COUNTER SPONGE SURGICOUNT (BAG) ×1 IMPLANT
CANISTER SUCTION 3000ML PPV (SUCTIONS) ×1 IMPLANT
CATH ACCU-VU SIZ PIG 5F 100CM (CATHETERS) IMPLANT
CATH VISIONS PV .035 IVUS (CATHETERS) IMPLANT
CLIP TI MEDIUM 6 (CLIP) ×1 IMPLANT
CLIP TI WIDE RED SMALL 6 (CLIP) ×1 IMPLANT
DERMABOND ADVANCED .7 DNX12 (GAUZE/BANDAGES/DRESSINGS) ×1 IMPLANT
DERMABOND ADVANCED .7 DNX6 (GAUZE/BANDAGES/DRESSINGS) IMPLANT
DEVICE CLOSURE PERCLS PRGLD 6F (Vascular Products) ×2 IMPLANT
DRSG TEGADERM 2-3/8X2-3/4 SM (GAUZE/BANDAGES/DRESSINGS) ×1 IMPLANT
ELECTRODE REM PT RTRN 9FT ADLT (ELECTROSURGICAL) ×2 IMPLANT
EXCLUDER EXT ENDO 23X4.5 15F (Endovascular Graft) IMPLANT
GLOVE BIOGEL PI IND STRL 8 (GLOVE) ×1 IMPLANT
GOWN STRL NON-REIN LRG LVL3 (GOWN DISPOSABLE) ×1 IMPLANT
GOWN STRL REUS W/ TWL LRG LVL3 (GOWN DISPOSABLE) ×1 IMPLANT
GOWN STRL REUS W/TWL 2XL LVL3 (GOWN DISPOSABLE) ×1 IMPLANT
KIT BASIN OR (CUSTOM PROCEDURE TRAY) ×1 IMPLANT
KIT TURNOVER KIT B (KITS) ×1 IMPLANT
PACK ENDOVASCULAR (PACKS) ×1 IMPLANT
PAD ARMBOARD POSITIONER FOAM (MISCELLANEOUS) ×2 IMPLANT
SET MICROPUNCTURE 5F STIFF (MISCELLANEOUS) ×1 IMPLANT
SHEATH AVANTI 11CM 8FR (SHEATH) ×1 IMPLANT
SHEATH DRYSEAL FLEX 22FR 33CM (SHEATH) IMPLANT
SLEEVE ISOL F/PACE RF HD COVER (MISCELLANEOUS) IMPLANT
SOLN 0.9% NACL POUR BTL 1000ML (IV SOLUTION) ×2 IMPLANT
STENT GRFT THORAC ACS 34X34X15 (Endovascular Graft) IMPLANT
STOPCOCK MORSE 400PSI 3WAY (MISCELLANEOUS) ×1 IMPLANT
SUT MNCRL AB 4-0 PS2 18 (SUTURE) ×1 IMPLANT
SUT PROLENE 5 0 C 1 24 (SUTURE) IMPLANT
SUT VIC AB 2-0 CTX 36 (SUTURE) IMPLANT
SUT VIC AB 3-0 SH 27X BRD (SUTURE) IMPLANT
SYR 30ML LL (SYRINGE) IMPLANT
SYR 50ML LL SCALE MARK (SYRINGE) ×1 IMPLANT
TOWEL GREEN STERILE (TOWEL DISPOSABLE) ×1 IMPLANT
TRAY FOLEY MTR SLVR 14FR STAT (SET/KITS/TRAYS/PACK) IMPLANT
TRAY FOLEY MTR SLVR 16FR STAT (SET/KITS/TRAYS/PACK) ×1 IMPLANT
TUBING HIGH PRESSURE 120CM (CONNECTOR) ×1 IMPLANT
WIRE BENTSON .035X145CM (WIRE) ×1 IMPLANT
WIRE STIFF LUNDERQUIST 260CM (WIRE) ×1 IMPLANT

## 2024-04-10 NOTE — Transfer of Care (Signed)
 Immediate Anesthesia Transfer of Care Note  Patient: Nicholas Burke  Procedure(s) Performed: INSERTION, ENDOVASCULAR STENT GRAFT, AORTA, THORACIC  Patient Location: PACU  Anesthesia Type:General  Level of Consciousness: awake, alert , and oriented  Airway & Oxygen Therapy: Patient Spontanous Breathing  Post-op Assessment: Report given to RN and Post -op Vital signs reviewed and stable  Post vital signs: Reviewed and stable  Last Vitals:  Vitals Value Taken Time  BP 108/64 04/10/24 09:42  Temp    Pulse 82 04/10/24 09:44  Resp 23 04/10/24 09:44  SpO2 95 % 04/10/24 09:44  Vitals shown include unfiled device data.  Last Pain:  Vitals:   04/10/24 0629  TempSrc:   PainSc: 0-No pain         Complications: No notable events documented.

## 2024-04-10 NOTE — Discharge Instructions (Signed)

## 2024-04-10 NOTE — Progress Notes (Signed)
 Patient arrived from  PACU VSS alert and oriented x 4 CCMD notifed, CHG bath completed, Bed in lowest position with call light in reach current plan of care in progress

## 2024-04-10 NOTE — H&P (Signed)
 Office Note  Patient seen and examined in preop holding.  No complaints. No changes to medication history or physical exam since last seen in clinic. After discussing the risks and benefits of TEVAR for type 1 endoleak and dissection, Nicholas Burke elected to proceed.   [name]  HPI: Nicholas Burke is a 78 y.o. (1945/06/16) male presenting in follow-up status post TEVAR for descending thoracic intramural hematoma from uncontrolled hypertension now degenerated into the thoracic aneurysm.  He was initially seen in the hospital with intramural hematoma.  This was medically managed, and the patient was asymptomatic, therefore we did not move forward with intervention.  He then grew significantly necessitating TEVAR.  He underwent TEVAR on 02/14/2024.  Completion imaging demonstrated seal.    He was admitted 2 weeks ago with hematuria, and underwent CT chest abdomen pelvis.  There was a type Ia endoleak appreciated.  Furthermore the CT angio abdomen pelvis also demonstrated progression of descending aortic saccular aneurysm and progression of infrarenal intramural hematoma to now focal dissection.   Past Medical History:  Diagnosis Date   AAA (abdominal aortic aneurysm)    Hx of radiation therapy 01/02/13- 02/24/13   prostate 7800 cGy/40 sessions, seminal vesicles 5600 cGy/40 sessions   Hypertension    Pre-diabetes    Prostate cancer (HCC) 08/30/2012   gleason 6, vol 65.7 cc    Past Surgical History:  Procedure Laterality Date   CATARACT EXTRACTION, BILATERAL Bilateral    COLONOSCOPY WITH PROPOFOL  N/A 10/18/2014   Procedure: COLONOSCOPY WITH PROPOFOL ;  Surgeon: [name], MD;  Location: [facility];  Service: Endoscopy;  Laterality: N/A;   CRYOABLATION N/A 12/24/2020   Procedure: CRYO ABLATION PROSTATE;  Surgeon: [name], MD;  Location: [facility] ORS;  Service: Urology;  Laterality: N/A;   CYSTOSCOPY WITH FULGERATION N/A 03/18/2024   Procedure: CYSTOSCOPY, WITH BLADDER  FULGURATION;  Surgeon: Sherrilee Belvie CROME, MD;  Location: [facility];  Service: Urology;  Laterality: N/A;   ESOPHAGOGASTRODUODENOSCOPY N/A 12/20/2023   Procedure: EGD (ESOPHAGOGASTRODUODENOSCOPY);  Surgeon: [name], MD;  Location: [facility] ENDOSCOPY;  Service: Gastroenterology;  Laterality: N/A;   ESOPHAGOGASTRODUODENOSCOPY N/A 12/26/2023   Procedure: EGD (ESOPHAGOGASTRODUODENOSCOPY);  Surgeon: [name], MD;  Location: [facility] ENDOSCOPY;  Service: Gastroenterology;  Laterality: N/A;   EYE SURGERY Bilateral    cataract surgery   HERNIA REPAIR     HOT HEMOSTASIS N/A 10/18/2014   Procedure: HOT HEMOSTASIS (ARGON PLASMA COAGULATION/BICAP);  Surgeon: [name], MD;  Location: [facility];  Service: Endoscopy;  Laterality: N/A;   PROSTATE BIOPSY  08/25/2012   THORACIC AORTIC ENDOVASCULAR STENT GRAFT Right 02/14/2024   Procedure: INSERTION, ENDOVASCULAR STENT GRAFT, AORTA, THORACIC;  Surgeon: Rim Fonda FORBES, MD;  Location: [facility] OR;  Service: Vascular;  Laterality: Right;   ULTRASOUND GUIDANCE Right 02/14/2024   Procedure: ULTRASOUND GUIDANCE;  Surgeon: Rim Fonda FORBES, MD;  Location: [facility] OR;  Service: Vascular;  Laterality: Right;    Social History   Socioeconomic History   Marital status: Married    Spouse name: Not on file   Number of children: 4   Years of education: Not on file   Highest education level: Not on file  Occupational History    Employer: [employer]  Tobacco Use   Smoking status: Never   Smokeless tobacco: Never  Vaping Use   Vaping status: Never Used  Substance and Sexual Activity   Alcohol use: No   Drug use: No   Sexual activity: Not on file  Other Topics Concern  Not on file  Social History Narrative   Not on file   Social Drivers of Health   Tobacco Use: Low Risk (04/10/2024)   Patient History    Smoking Tobacco Use: Never    Smokeless Tobacco Use: Never    Passive Exposure: Not on file  Financial Resource Strain: Not on file  Food Insecurity:  No Food Insecurity (03/17/2024)   Epic    Worried About Programme Researcher, Broadcasting/film/video in the Last Year: Never true    Ran Out of Food in the Last Year: Never true  Transportation Needs: No Transportation Needs (03/17/2024)   Epic    Lack of Transportation (Medical): No    Lack of Transportation (Non-Medical): No  Physical Activity: Not on file  Stress: Not on file  Social Connections: Socially Integrated (03/17/2024)   Social Connection and Isolation Panel    Frequency of Communication with Friends and Family: Three times a week    Frequency of Social Gatherings with Friends and Family: Three times a week    Attends Religious Services: More than 4 times per year    Active Member of Clubs or Organizations: Yes    Attends Banker Meetings: More than 4 times per year    Marital Status: Married  Catering Manager Violence: Not At Risk (03/17/2024)   Epic    Fear of Current or Ex-Partner: No    Emotionally Abused: No    Physically Abused: No    Sexually Abused: No  Depression (PHQ2-9): Not on file  Alcohol Screen: Not on file  Housing: Low Risk (03/17/2024)   Epic    Unable to Pay for Housing in the Last Year: No    Number of Times Moved in the Last Year: 0    Homeless in the Last Year: No  Utilities: Not At Risk (03/17/2024)   Epic    Threatened with loss of utilities: No  Health Literacy: Not on file   Family History  Problem Relation Age of Onset   Prostate cancer Father    Prostate cancer Brother     Current Facility-Administered Medications  Medication Dose Route Frequency Provider Last Rate Last Admin   0.9 %  sodium chloride  infusion   Intravenous Continuous Lanis Fonda BRAVO, MD       0.9 %  sodium chloride  infusion   Intravenous Continuous Hodierne, Adam, MD       ceFAZolin  (ANCEF ) IVPB 2g/100 mL premix  2 g Intravenous 30 min Pre-Op Breylan Lefevers E, MD       Chlorhexidine  Gluconate Cloth 2 % PADS 6 each  6 each Topical Once Amilcar Reever E, MD       And    Chlorhexidine  Gluconate Cloth 2 % PADS 6 each  6 each Topical Once Maryuri Warnke E, MD        Allergies  Allergen Reactions   Reglan  [Metoclopramide ] Other (See Comments)    Pt states that it made him shake and had difficulty speaking     REVIEW OF SYSTEMS:  [X]  denotes positive finding, [ ]  denotes negative finding Cardiac  Comments:  Chest pain or chest pressure:    Shortness of breath upon exertion:    Short of breath when lying flat:    Irregular heart rhythm:        Vascular    Pain in calf, thigh, or hip brought on by ambulation:    Pain in feet at night that wakes you up from your sleep:  Blood clot in your veins:    Leg swelling:         Pulmonary    Oxygen at home:    Productive cough:     Wheezing:         Neurologic    Sudden weakness in arms or legs:     Sudden numbness in arms or legs:     Sudden onset of difficulty speaking or slurred speech:    Temporary loss of vision in one eye:     Problems with dizziness:         Gastrointestinal    Blood in stool:     Vomited blood:         Genitourinary    Burning when urinating:     Blood in urine:        Psychiatric    Major depression:         Hematologic    Bleeding problems:    Problems with blood clotting too easily:        Skin    Rashes or ulcers:        Constitutional    Fever or chills:      PHYSICAL EXAMINATION:  Vitals:   04/10/24 0557  BP: 102/63  Pulse: 82  Resp: 18  Temp: 98.4 F (36.9 C)  TempSrc: Oral  SpO2: 98%  Weight: 78 kg  Height: 5' 10 (1.778 m)    General:  WDWN in NAD; vital signs documented above Gait: Not observed HENT: WNL, normocephalic Pulmonary: normal non-labored breathing , without wheezing Cardiac: regular HR Abdomen: soft, NT, no masses Skin: without rashes Vascular Exam/Pulses:  Right Left  Radial 2+ (normal) 2+ (normal)              DP 2+ (normal) 2+ (normal)       Extremities: without ischemic changes, without Gangrene , without  cellulitis; without open wounds;  Musculoskeletal: no muscle wasting or atrophy  Neurologic: A&O X 3;  No focal weakness or paresthesias are detected Psychiatric:  The pt has Normal affect.   Non-Invasive Vascular Imaging:   New CTA demonstrated 5 cm descending thoracic aorta.  This has had significant aneurysmal degeneration over the last month.    ASSESSMENT/PLAN: Nicholas Burke is a 78 y.o. male presenting with descending aorta intramural hematoma degenerated into aneurysm now status post TEVAR for rapid growth.  1 month follow-up imaging demonstrates type Ia endoleak, it appears that the graft likely slid distally due to angulation.  Fortunately, Rolfe remains asymptomatic.  We had a long discussion regarding his aneurysm, and the need for extension proximally to create seal to decrease pressure to the aneurysm sac.  Without seal, the aneurysm will continue to grow.  I also discussed with him thoracic findings and infrarenal aortic findings.  The infrarenal aorta has degenerated from hematoma to multi luminal dissection.  I do not want this to travel.  My plan would be to place a small aortic cuff at the time of thoracic reintervention to prevent further aneurysmal degeneration/worsening of the dissection.   Fonda FORBES Rim, MD [facility] [phone]

## 2024-04-10 NOTE — Anesthesia Procedure Notes (Signed)
 Procedure Name: Intubation Date/Time: 04/10/2024 7:55 AM  Performed by: Roslynn Waddell LABOR, CRNAPre-anesthesia Checklist: Patient identified, Emergency Drugs available, Suction available and Patient being monitored Patient Re-evaluated:Patient Re-evaluated prior to induction Oxygen Delivery Method: Circle System Utilized Preoxygenation: Pre-oxygenation with 100% oxygen Induction Type: IV induction Ventilation: Mask ventilation without difficulty Laryngoscope Size: Glidescope and 4 Grade View: Grade I Tube type: Oral Tube size: 7.5 mm Number of attempts: 1 Airway Equipment and Method: Stylet and Oral airway Placement Confirmation: ETT inserted through vocal cords under direct vision, positive ETCO2 and breath sounds checked- equal and bilateral Secured at: 23 cm Tube secured with: Tape Dental Injury: Teeth and Oropharynx as per pre-operative assessment  Comments: Atraumatic induction/intubation. Dentition and oral mucosa as per preop.

## 2024-04-10 NOTE — Op Note (Signed)
 NAME: Nicholas Burke    MRN: [id number] DOB: 1945-10-29    DATE OF OPERATION: 04/10/2024  PREOP DIAGNOSIS:    Thoracic endovascular aortic repair endoleak, infrarenal aorta with complex focal dissection  POSTOP DIAGNOSIS:    Same  PROCEDURE:    Ultrasound-guided micropuncture access of the right common femoral artery in retrograde fashion Aortic arch angiogram Intravascular ultrasound of the aorta Deployment of proximal extension of previously placed thoracic stent immediately distal to the left subclavian 34 x 15 cm Gore C tag Aortic angiogram Endovascular aortic repair of the infrarenal abdominal aorta using a 23 x 4.5 infrarenal cuff Angiogram of the right common femoral artery, external iliac artery, superficial femoral artery, profunda  SURGEON: Fonda FORBES Rim  ASSIST: Lucie Motto, PA  ANESTHESIA: General  EBL: 45 mL  INDICATIONS:    [name] is a 78 y.o. male with previous history of aortic intramural hematoma that degenerated into an aneurysm from zone 3 to zone 4.  He underwent thoracic endovascular aortic repair with a 31 x 20 cm graft.  Follow-up imaging demonstrated type Ia endoleak.  Furthermore, he had degenerative changes distally at his infrarenal aorta where the prior intramural hematoma degenerated into a focal, complex dissection.  He presents today for extension to the level of the left subclavian, as well as coverage of the infrarenal aortic dissection.  After discussing risks and benefits, Melik elected to proceed.  FINDINGS:   Widely patent innominate artery, left carotid artery, left subclavian artery Type Ia versus 2 endoleak of the previously placed TEVAR Widely patent iliac artery, superior mesenteric artery, bilateral renal arteries Trilobed infrarenal focal dissection from intramural hematoma degeneration.  TECHNIQUE:   Patient was brought to the OR laid in supine position.  General anesthesia was induced and patient's prepped draped  standard fashion.  The case began with ultrasound insonation of the right common femoral artery.  The left groin had a previous incision from hernia repair, therefore I elected to stay in the right groin with single PERC access.  The common femoral artery was accessed using ultrasound guided micropuncture wire and a wire was run into the infrarenal aorta.  This was dilated using the inner dilator of his 8 French sheath, followed by 2 Pro-glide deployments in preclose fashion.  Next, an 8 French sheath was placed, the patient heparinized, and the wire run into the ascending aorta.  This was exchanged for a pigtail catheter, and imaging of the thoracic arch followed.  See results above.  There appeared to be a jet within the aneurysm sac it was difficult to ascertain whether this was a type I or type II endoleak, I did not want to risk a type I endoleak, which appeared to be present on CTA, therefore I elected to extend proximally.  I used intravascular ultrasound from the right common femoral artery through the entirety of the aorta, specifically to mark the left subclavian artery.  This was marked.a 22 French sheath was then delivered to the right groin, and the device positioned, followed by angiographic run under breath-hold.  The device was deployed in standard fashion immediately distal to the left subclavian artery.  I was very happy with the landing site.  Follow-up imaging demonstrated no type I endoleak.  There is still appear to be some amount of filling of the aneurysm sac, with what appeared to be a small type II endoleak.  Next, my attention turned distally to the focal dissection appreciated in the infrarenal aorta.  Using previous  imaging, I elected to use a 23 x 4.5 cm cuff.  Diagnostic angiography was obtained, followed by deployment immediately distal to the left renal artery, covering the lesion.  I was very happy with this outcome.  The 22 French sheath was removed over a wire and Pro-glide's  tightened.  Diagnostic angiography of the access site demonstrated widely patent external neck artery, common femoral artery, proximal superficial femoral artery, proximal profunda.  The percutaneous incision was closed using Monocryl suture with Dermabond at the level of the skin.  Fonda FORBES Rim, MD [facility] DATE OF DICTATION:   04/10/2024

## 2024-04-10 NOTE — Progress Notes (Signed)
°  Progress Note    04/10/2024 4:13 PM * Day of Surgery *  Subjective:  no complaints; denies any chest, back or abdominal pain    Vitals:   04/10/24 1400 04/10/24 1525  BP:  (!) 97/59  Pulse: 81   Resp:    Temp:    SpO2: 100%    Physical Exam: Cardiac:  regular Lungs:  non labored Incisions:  right CF access site without swelling or hematoma Extremities:  2+ Dp pulses, 2+ radial pulses bilaterally Abdomen:  soft, non distended, non tender Neurologic: alert and oriented  CBC    Component Value Date/Time   WBC 8.1 04/10/2024 1030   RBC 3.30 (L) 04/10/2024 1030   HGB 7.7 (L) 04/10/2024 1030   HCT 25.4 (L) 04/10/2024 1030   PLT 309 04/10/2024 1030   MCV 77.0 (L) 04/10/2024 1030   MCH 23.3 (L) 04/10/2024 1030   MCHC 30.3 04/10/2024 1030   RDW 19.3 (H) 04/10/2024 1030   LYMPHSABS 1.5 03/20/2024 0441   MONOABS 0.8 03/20/2024 0441   EOSABS 0.1 03/20/2024 0441   BASOSABS 0.0 03/20/2024 0441    BMET    Component Value Date/Time   NA 140 04/10/2024 1030   NA 142 03/10/2024 1036   K 3.7 04/10/2024 1030   CL 108 04/10/2024 1030   CO2 24 04/10/2024 1030   GLUCOSE 123 (H) 04/10/2024 1030   BUN 13 04/10/2024 1030   BUN 23 03/10/2024 1036   CREATININE 1.05 04/10/2024 1030   CALCIUM  8.2 (L) 04/10/2024 1030   GFRNONAA >60 04/10/2024 1030   GFRAA >60 02/16/2019 0319    INR    Component Value Date/Time   INR 1.2 04/06/2024 1128     Intake/Output Summary (Last 24 hours) at 04/10/2024 1613 Last data filed at 04/10/2024 1200 Gross per 24 hour  Intake 1200 ml  Output 770 ml  Net 430 ml     Assessment/Plan:  78 y.o. male is s/p  Ultrasound-guided micropuncture access of the right common femoral artery in retrograde fashion Aortic arch angiogram Intravascular ultrasound of the aorta Deployment of proximal extension of previously placed thoracic stent immediately distal to the left subclavian 34 x 15 cm Gore C tag Aortic angiogram Endovascular aortic repair of  the infrarenal abdominal aorta using a 23 x 4.5 infrarenal cuff Angiogram of the right common femoral artery, external iliac artery, superficial femoral artery, profunda   * Day of Surgery *   Extremities all well perfused and warm Right CF access site without swelling or hematoma Denies any chest, back or abdominal pain Hemodynamically stable VSS Continue routine post operative care Hopefully discharge tomorrow if he continues to progress well    Teretha Damme, PA-C [facility] [phone] 04/10/2024 4:13 PM

## 2024-04-10 NOTE — Anesthesia Procedure Notes (Signed)
 Arterial Line Insertion Start/End12/15/2025 7:00 AM, 04/10/2024 7:10 AM Performed by: Maryclare Cornet, MD, Roslynn Waddell LABOR, CRNA, CRNA  Preanesthetic checklist: patient identified, IV checked, site marked, risks and benefits discussed, surgical consent, monitors and equipment checked, pre-op evaluation, timeout performed and anesthesia consent Lidocaine  1% used for infiltration Left, radial was placed Catheter size: 20 G Hand hygiene performed  and Seldinger technique used  Attempts: 1 Following insertion, dressing applied (CHG dressing). Patient tolerated the procedure well with no immediate complications.

## 2024-04-11 ENCOUNTER — Encounter (HOSPITAL_COMMUNITY): Payer: Self-pay | Admitting: Vascular Surgery

## 2024-04-11 ENCOUNTER — Other Ambulatory Visit (HOSPITAL_COMMUNITY): Payer: Self-pay

## 2024-04-11 LAB — CBC
HCT: 23.5 % — ABNORMAL LOW (ref 39.0–52.0)
Hemoglobin: 7.3 g/dL — ABNORMAL LOW (ref 13.0–17.0)
MCH: 23.5 pg — ABNORMAL LOW (ref 26.0–34.0)
MCHC: 31.1 g/dL (ref 30.0–36.0)
MCV: 75.8 fL — ABNORMAL LOW (ref 80.0–100.0)
Platelets: 331 K/uL (ref 150–400)
RBC: 3.1 MIL/uL — ABNORMAL LOW (ref 4.22–5.81)
RDW: 19.1 % — ABNORMAL HIGH (ref 11.5–15.5)
WBC: 10 K/uL (ref 4.0–10.5)
nRBC: 0 % (ref 0.0–0.2)

## 2024-04-11 LAB — BASIC METABOLIC PANEL WITH GFR
Anion gap: 9 (ref 5–15)
BUN: 17 mg/dL (ref 8–23)
CO2: 22 mmol/L (ref 22–32)
Calcium: 8.3 mg/dL — ABNORMAL LOW (ref 8.9–10.3)
Chloride: 110 mmol/L (ref 98–111)
Creatinine, Ser: 1.07 mg/dL (ref 0.61–1.24)
GFR, Estimated: >60 mL/min (ref >60)
Glucose, Bld: 133 mg/dL — ABNORMAL HIGH (ref 70–99)
Potassium: 3.7 mmol/L (ref 3.5–5.1)
Sodium: 141 mmol/L (ref 135–145)

## 2024-04-11 LAB — PREPARE RBC (CROSSMATCH)

## 2024-04-11 MED ORDER — OXYCODONE-ACETAMINOPHEN 5-325 MG PO TABS
1.0000 | ORAL_TABLET | Freq: Four times a day (QID) | ORAL | 0 refills | Status: AC | PRN
  Filled 2024-04-11: qty 8, 2d supply, fill #0

## 2024-04-11 MED ORDER — SODIUM CHLORIDE 0.9% IV SOLUTION: Freq: Once | INTRAVENOUS | Status: DC

## 2024-04-11 NOTE — Progress Notes (Signed)
 Mobility Specialist Progress Note;   04/11/24 0938  Mobility  Activity Ambulated with assistance  Level of Assistance Standby assist, set-up cues, supervision of patient - no hands on  Assistive Device None  Distance Ambulated (ft) 470 ft  Activity Response Tolerated well  Mobility Referral Yes  Mobility visit 1 Mobility  Mobility Specialist Start Time (ACUTE ONLY) [id number]  Mobility Specialist Stop Time (ACUTE ONLY) 0948  Mobility Specialist Time Calculation (min) (ACUTE ONLY) 10 min   Pt sitting on EoB upon arrival, eager for mobility. Required no physical assistance during ambulation, SV for safety. Pt a bit unsteady w/ gait d/t L knee pain. VSS throughout. No bleeding at R groin site. Pt returned to bed and left with all needs met. RN notified.   Lauraine Erm Mobility Specialist Please contact via SecureChat or [employer] [phone]

## 2024-04-11 NOTE — TOC Transition Note (Signed)
 Transition of Care (TOC) - Discharge Note Rayfield Gobble RN, BSN Inpatient Care Management Unit 4E- RN Case Manager See Treatment Team for direct phone #   Patient Details  Name: Nicholas Burke MRN: [id number] Date of Birth: [birth year]  Transition of Care [facility] CM/SW Contact:  Gobble Rayfield Hurst, RN Phone Number: 04/11/2024, 3:42 PM   Clinical Narrative:    Spoke with pt this am regarding [facility] office referral for [facility] needs. Pt voiced he is not sure he will need any HH follow up but is agreeable to have [facility] liaison contact post discharge to check on him and follow up on needs. [facility] liaison updated and will contact pt post discharge- office protocol will be used if needed.   Pt declined any HH needs, family will transport home.   No further IP CM needs identified.    Final next level of care: Home w Home Health Services Barriers to Discharge: No Barriers Identified   Patient Goals and CMS Choice Patient states their goals for this hospitalization and ongoing recovery are:: return home   Choice offered to / list presented to : Patient ([facility] office referral)      Discharge Placement               Home        Discharge Plan and Services Additional resources added to the After Visit Summary for     Discharge Planning Services: CM Consult Post Acute Care Choice: Home Health          DME Arranged: N/A DME Agency: NA         HH Agency: [facility] Date HH Agency Contacted: 04/11/24 Time HH Agency Contacted: 1030 Representative spoke with at [facility] Agency: [facility]  Social Drivers of Health (SDOH) Interventions SDOH Screenings   Food Insecurity: No Food Insecurity (03/17/2024)  Housing: Low Risk (03/17/2024)  Transportation Needs: No Transportation Needs (03/17/2024)  Utilities: Not At Risk (03/17/2024)  Social Connections: Socially Integrated (03/17/2024)  Tobacco Use: Low Risk (04/10/2024)     Readmission Risk Interventions     04/11/2024    3:42 PM 02/15/2024   11:37 AM  Readmission Risk Prevention Plan  Transportation Screening Complete Complete  Home Care Screening  Complete  Medication Review (RN CM)  Complete  HRI or Home Care Consult Complete   Social Work Consult for Recovery Care Planning/Counseling Complete   Palliative Care Screening Not Applicable   Medication Review Oceanographer) Complete

## 2024-04-11 NOTE — Progress Notes (Addendum)
°  Progress Note    04/11/2024 6:47 AM 1 Day Post-Op  Subjective:  no complaints.   Afebrile HR 80's 100's-120's systolic 100% RA  Vitals:   04/11/24 0356 04/11/24 0500  BP: 123/67 124/67  Pulse: 81 84  Resp: 17   Temp: 97.7 F (36.5 C)   SpO2: 98% 97%    Physical Exam: General:  no distress Cardiac:  regular Lungs:  non labored Incisions:  right groin soft without hematoma Extremities:  palpable right PT pulse Abdomen:  soft  CBC    Component Value Date/Time   WBC 10.0 04/11/2024 0510   RBC 3.10 (L) 04/11/2024 0510   HGB 7.3 (L) 04/11/2024 0510   HCT 23.5 (L) 04/11/2024 0510   PLT 331 04/11/2024 0510   MCV 75.8 (L) 04/11/2024 0510   MCH 23.5 (L) 04/11/2024 0510   MCHC 31.1 04/11/2024 0510   RDW 19.1 (H) 04/11/2024 0510   LYMPHSABS 1.5 03/20/2024 0441   MONOABS 0.8 03/20/2024 0441   EOSABS 0.1 03/20/2024 0441   BASOSABS 0.0 03/20/2024 0441    BMET    Component Value Date/Time   NA 141 04/11/2024 0510   NA 142 03/10/2024 1036   K 3.7 04/11/2024 0510   CL 110 04/11/2024 0510   CO2 22 04/11/2024 0510   GLUCOSE 133 (H) 04/11/2024 0510   BUN 17 04/11/2024 0510   BUN 23 03/10/2024 1036   CREATININE 1.07 04/11/2024 0510   CALCIUM  8.3 (L) 04/11/2024 0510   GFRNONAA >60 04/11/2024 0510   GFRAA >60 02/16/2019 0319    INR    Component Value Date/Time   INR 1.2 04/06/2024 1128     Intake/Output Summary (Last 24 hours) at 04/11/2024 0647 Last data filed at 04/11/2024 0600 Gross per 24 hour  Intake 2721.3 ml  Output 1595 ml  Net 1126.3 ml      Assessment/Plan:  78 y.o. male is s/p:  Ultrasound-guided micropuncture access of the right common femoral artery in retrograde fashion Aortic arch angiogram Intravascular ultrasound of the aorta Deployment of proximal extension of previously placed thoracic stent immediately distal to the left subclavian 34 x 15 cm Gore C tag Aortic angiogram Endovascular aortic repair of the infrarenal abdominal  aorta using a 23 x 4.5 infrarenal cuff Angiogram of the right common femoral artery, external iliac artery, superficial femoral artery, profunda  1 Day Post-Op   -pt doing well this am.  Right groin soft without hematoma.   -acute surgical blood loss anemia - tolerating.  hgb 7.3 (this am)< 7.7 (immediately after surgery) < 8.8 (pre-op).  Most likely dilutional and he is tolerating.  Will not transfuse.   -indwelling foley management per Urology.   -renal function normal this am -DVT prophylaxis:  sq heparin  -discharge home today.  F/u with Dr. Lanis in 4 weeks with CTA c/a/p.    Lucie Apt, PA-C [facility] [phone] 04/11/2024 6:47 AM  VASCULAR STAFF ADDENDUM: I have independently interviewed and examined the patient. I agree with the above.  OOB, 1uprbc Home pending PT   Fonda FORBES Lanis MD [facility] Phone Number: [phone] 04/11/2024 3:09 PM

## 2024-04-11 NOTE — Anesthesia Postprocedure Evaluation (Signed)
 Anesthesia Post Note  Patient: Nicholas Burke  Procedure(s) Performed: INSERTION, ENDOVASCULAR STENT GRAFT, AORTA, THORACIC     Patient location during evaluation: PACU Anesthesia Type: General Level of consciousness: awake and alert Pain management: pain level controlled Vital Signs Assessment: post-procedure vital signs reviewed and stable Respiratory status: spontaneous breathing, nonlabored ventilation, respiratory function stable and patient connected to nasal cannula oxygen Cardiovascular status: blood pressure returned to baseline and stable Postop Assessment: no apparent nausea or vomiting Anesthetic complications: no   No notable events documented.  Last Vitals:  Vitals:   04/11/24 0356 04/11/24 0500  BP: 123/67 124/67  Pulse: 81 84  Resp: 17   Temp: 36.5 C   SpO2: 98% 97%    Last Pain:  Vitals:   04/11/24 0500  TempSrc:   PainSc: 0-No pain                 Lenoard Helbert S

## 2024-04-11 NOTE — Plan of Care (Signed)

## 2024-04-11 NOTE — Progress Notes (Signed)
 Patient discharged from unit  medications and property returned to designated person. Discharge instructions reviewed and patient questions answered.

## 2024-04-12 DIAGNOSIS — I7123 Aneurysm of the descending thoracic aorta, without rupture: Secondary | ICD-10-CM

## 2024-04-12 LAB — TYPE AND SCREEN
ABO/RH(D): A POS
Antibody Screen: NEGATIVE
Unit division: 0

## 2024-04-12 LAB — BPAM RBC
Blood Product Expiration Date: 202512212359
ISSUE DATE / TIME: 202512161209
Unit Type and Rh: 600

## 2024-04-13 ENCOUNTER — Telehealth: Payer: Self-pay

## 2024-04-13 LAB — POCT ACTIVATED CLOTTING TIME
Activated Clotting Time: 225 s
Activated Clotting Time: 230 s
Activated Clotting Time: 240 s

## 2024-04-13 NOTE — Telephone Encounter (Signed)
 Leita, RN from [facility] called to get verbal order for pt's skilled nursing visits.  Verbal order given for skilled nursing 1x/wk for 1 wk followed by 2x/wk for 3 wks.

## 2024-04-13 NOTE — Discharge Summary (Addendum)
 EVAR Discharge Summary   Nicholas Burke [birth year] 78 y.o. male  MRN: [id number]  Admission Date: 04/10/2024  Discharge Date: 04/12/2024  Physician: Dr. Lanis  Admission Diagnosis: Aneurysm of the descending thoracic aorta, without rupture [I71.23] S/P insertion of endovascular thoracic aortic stent graft [Z95.828] Status post endovascular aneurysm repair (EVAR) [S01.109, Z86.79]   HPI:   This is a 78 y.o. male  with previous history of aortic intramural hematoma that degenerated into an aneurysm from zone 3 to zone 4.  He underwent thoracic endovascular aortic repair with a 31 x 20 cm graft.  Follow-up imaging demonstrated type Ia endoleak.  Furthermore, he had degenerative changes distally at his infrarenal aorta where the prior intramural hematoma degenerated into a focal, complex dissection.  He presents today for extension to the level of the left subclavian, as well as coverage of the infrarenal aortic dissection.  After discussing risks and benefits, Nicholas Burke elected to proceed.   Hospital Course:  The patient was admitted to the hospital and taken to the operating room on 04/10/2024 and underwent: Ultrasound-guided micropuncture access of the right common femoral artery in retrograde fashion Aortic arch angiogram Intravascular ultrasound of the aorta Deployment of proximal extension of previously placed thoracic stent immediately distal to the left subclavian 34 x 15 cm Gore C tag Aortic angiogram Endovascular aortic repair of the infrarenal abdominal aorta using a 23 x 4.5 infrarenal cuff Angiogram of the right common femoral artery, external iliac artery, superficial femoral artery, profunda    Findings: Widely patent innominate artery, left carotid artery, left subclavian artery Type Ia versus 2 endoleak of the previously placed TEVAR Widely patent iliac artery, superior mesenteric artery, bilateral renal arteries Trilobed infrarenal focal dissection from intramural  hematoma degeneration.  The pt tolerated the procedure well and was transported to the PACU in good condition.   By POD 1, he was doing well.  Right groin soft without hematoma.  He did have acute surgical blood loss anemia with hgb of 7.3.  given hematuria and surgical blood loss, he did receive one PRBC prior to discharge.  His indwelling foley will be managed by Urology.     CBC    Component Value Date/Time   WBC 10.0 04/11/2024 0510   RBC 3.10 (L) 04/11/2024 0510   HGB 7.3 (L) 04/11/2024 0510   HCT 23.5 (L) 04/11/2024 0510   PLT 331 04/11/2024 0510   MCV 75.8 (L) 04/11/2024 0510   MCH 23.5 (L) 04/11/2024 0510   MCHC 31.1 04/11/2024 0510   RDW 19.1 (H) 04/11/2024 0510   LYMPHSABS 1.5 03/20/2024 0441   MONOABS 0.8 03/20/2024 0441   EOSABS 0.1 03/20/2024 0441   BASOSABS 0.0 03/20/2024 0441    BMET    Component Value Date/Time   NA 141 04/11/2024 0510   NA 142 03/10/2024 1036   K 3.7 04/11/2024 0510   CL 110 04/11/2024 0510   CO2 22 04/11/2024 0510   GLUCOSE 133 (H) 04/11/2024 0510   BUN 17 04/11/2024 0510   BUN 23 03/10/2024 1036   CREATININE 1.07 04/11/2024 0510   CALCIUM  8.3 (L) 04/11/2024 0510   GFRNONAA >60 04/11/2024 0510   GFRAA >60 02/16/2019 0319       Discharge Instructions     Discharge patient   Complete by: As directed    discharge med sent to [facility] TOC.   Discharge disposition: 01-Home or Self Care   Discharge patient date: 04/11/2024       Discharge Diagnosis:  Aneurysm of the  descending thoracic aorta, without rupture [I71.23] S/P insertion of endovascular thoracic aortic stent graft [Z95.828] Status post endovascular aneurysm repair (EVAR) [S01.109, Z86.79]  Secondary Diagnosis: Patient Active Problem List   Diagnosis Date Noted   Status post endovascular aneurysm repair (EVAR) 04/10/2024   Gross hematuria 03/17/2024   Symptomatic anemia 03/17/2024   S/P insertion of endovascular thoracic aortic stent graft 02/14/2024   Thoracic aortic  aneurysm 02/14/2024   Malnutrition of moderate degree 12/14/2023   Intramural aortic hematoma (HCC) 12/06/2023   Essential hypertension 12/06/2023   Disorder associated with type 2 diabetes mellitus (HCC) 12/06/2023   Glaucoma 12/06/2023   Type 2 diabetes with complication (HCC) 12/06/2023   Mixed hyperlipidemia 12/06/2023   History of malignant neoplasm of prostate 12/06/2023   Aortic dissection (HCC) 12/06/2023   Special screening for malignant neoplasms, colon 10/18/2014   Hx of radiation therapy    Prostate cancer (HCC) 11/03/2012   Past Medical History:  Diagnosis Date   AAA (abdominal aortic aneurysm)    Hx of radiation therapy 01/02/13- 02/24/13   prostate 7800 cGy/40 sessions, seminal vesicles 5600 cGy/40 sessions   Hypertension    Pre-diabetes    Prostate cancer (HCC) 08/30/2012   gleason 6, vol 65.7 cc     Allergies as of 04/11/2024       Reactions   Reglan  [metoclopramide ] Other (See Comments)   Pt states that it made him shake and had difficulty speaking        Medication List     TAKE these medications    acetaminophen  325 MG tablet Commonly known as: TYLENOL  Take 2 tablets (650 mg total) by mouth every 6 (six) hours as needed for mild pain (pain score 1-3) or fever (or Fever >/= 101).   albuterol  108 (90 Base) MCG/ACT inhaler Commonly known as: VENTOLIN  HFA Inhale 2 puffs into the lungs every 4 (four) hours as needed for wheezing or shortness of breath.   amLODipine  10 MG tablet Commonly known as: NORVASC  Take 1 tablet (10 mg total) by mouth daily.   aspirin  EC 81 MG tablet Take 1 tablet (81 mg total) by mouth daily at 6 (six) AM. Swallow whole.   carvedilol  12.5 MG tablet Commonly known as: COREG  Take 1 tablet (12.5 mg total) by mouth 2 (two) times daily with a meal.   feeding supplement Liqd Take 237 mLs by mouth 2 (two) times daily between meals.   finasteride  5 MG tablet Commonly known as: PROSCAR  Take 5 mg by mouth daily.    hydrALAZINE  50 MG tablet Commonly known as: APRESOLINE  Take 1 tablet (50 mg total) by mouth 3 (three) times daily.   mirabegron  ER 25 MG Tb24 tablet Commonly known as: MYRBETRIQ  Take 1 tablet (25 mg total) by mouth daily.   oxyCODONE -acetaminophen  5-325 MG tablet Commonly known as: Percocet Take 1 tablet by mouth every 6 (six) hours as needed for severe pain (pain score 7-10). What changed: reasons to take this   pantoprazole  40 MG tablet Commonly known as: PROTONIX  Take 1 tablet (40 mg total) by mouth 2 (two) times daily before a meal.   polyethylene glycol powder 17 GM/SCOOP powder Commonly known as: GLYCOLAX /MIRALAX  Dissolve 1 capful (17g) in 4-8 ounces of liquid and take by mouth daily. What changed:  when to take this reasons to take this   rosuvastatin  5 MG tablet Commonly known as: CRESTOR  Take 5 mg by mouth every Monday, Wednesday, and Friday at 6 PM.        Discharge Instructions:  [facility]  Discharge Instructions Endovascular Aortic Aneurysm Repair  Please refer to the following instructions for your post-procedure care. Your surgeon or Physician Assistant will discuss any changes with you.  Activity  You are encouraged to walk as much as you can. You can slowly return to normal activities but must avoid strenuous activity and heavy lifting until your doctor tells you it's OK. Avoid activities such as vacuuming or swinging a gold club. It is normal to feel tired for several weeks after your surgery. Do not drive until your doctor gives the OK and you are no longer taking prescription pain medications. It is also normal to have difficulty with sleep habits, eating, and bowel movements after surgery. These will go away with time.  Bathing/Showering  You may shower after you go home. If you have an incision, do not soak in a bathtub, hot tub, or swim until the incision heals completely.  Incision Care  Shower every day.  Clean your incision with mild soap and water . Pat the area dry with a clean towel. You do not need a bandage unless otherwise instructed. Do not apply any ointments or creams to your incision. If you clothing is irritating, you may cover your incision with a dry gauze pad.  Diet  Resume your normal diet. There are no special food restrictions following this procedure. A low fat/low cholesterol diet is recommended for all patients with vascular disease. In order to heal from your surgery, it is CRITICAL to get adequate nutrition. Your body requires vitamins, minerals, and protein. Vegetables are the best source of vitamins and minerals. Vegetables also provide the perfect balance of protein. Processed food has little nutritional value, so try to avoid this.  Medications  Resume taking all of your medications unless your doctor or Physician Assistnat tells you not to. If your incision is causing pain, you may take over-the-counter pain relievers such as acetaminophen  (Tylenol ). If you were prescribed a stronger pain medication, please be aware these medications can cause nausea and constipation. Prevent nausea by taking the medication with a snack or meal. Avoid constipation by drinking plenty of fluids and eating foods with a high amount of fiber, such as fruits, vegetables, and grains.  Do not take Tylenol  if you are taking prescription pain medications.   Follow up  Our office will schedule a follow-up appointment with a C.T. scan 3-4 weeks after your surgery.  Please call us  immediately for any of the following conditions  Severe or worsening pain in your legs or feet or in your abdomen back or chest. Increased pain, redness, drainage (pus) from your incision sit. Increased abdominal pain, bloating, nausea, vomiting or persistent diarrhea. Fever of 101 degrees or higher. Swelling in your leg (s),  Reduce your risk of vascular disease  Stop smoking. If you would like help call [facility]  at 1-800-QUIT-NOW (8704408721) or [place] at [phone]. Manage your cholesterol Maintain a desired weight Control your diabetes Keep your blood pressure down  If you have questions, please call the office at [phone].   Prescriptions given: 1.  Roxicet #8 No Refill  Disposition: home  Patient's condition: is Good  Follow up: 1. Dr. Lanis in 4 weeks with CTA protocol   Lucie Apt, PA-C [facility] [phone] 04/13/2024  11:56 AM   - For VQI Registry use - Post-op:  Time to Extubation: [x]  In OR, [ ]  < 12 hrs, [ ]  12-24 hrs, [ ]  >=24 hrs Vasopressors  Req. Post-op: No MI: No., [ ]  Troponin only, [ ]  EKG or Clinical New Arrhythmia: No CHF: No ICU Stay: 1 day in progress Transfusion: Yes     If yes, one unit given  Complications: Resp failure: No., [ ]  Pneumonia, [ ]  Ventilator Chg in renal function: No., [ ]  Inc. Cr > 0.5, [ ]  Temp. Dialysis,  [ ]  Permanent dialysis Leg ischemia: No., no Surgery needed, [ ]  Yes, Surgery needed,  [ ]  Amputation Bowel ischemia: No., [ ]  Medical Rx, [ ]  Surgical Rx Wound complication: No., [ ]  Superficial separation/infection, [ ]  Return to OR Return to OR: No  Return to OR for bleeding: No Stroke: No., [ ]  Minor, [ ]  Major  Discharge medications: Statin use:  Yes  ASA use:  Yes  Plavix use:  No  Beta blocker use:  Yes  ARB use:  No ACEI use:  No CCB use:  Yes

## 2024-04-26 ENCOUNTER — Ambulatory Visit (HOSPITAL_COMMUNITY)
Admission: RE | Admit: 2024-04-26 | Discharge: 2024-04-26 | Disposition: A | Discharge status: Home / Self Care | Source: Ambulatory Visit | Attending: Student in an Organized Health Care Education/Training Program | Admitting: Student in an Organized Health Care Education/Training Program

## 2024-04-26 DIAGNOSIS — I7123 Aneurysm of the descending thoracic aorta, without rupture: Secondary | ICD-10-CM | POA: Insufficient documentation

## 2024-04-26 MED ORDER — IOHEXOL 350 MG/ML SOLN
100.0000 mL | Freq: Once | INTRAVENOUS | Status: AC | PRN
  Administered 2024-04-26: 100 mL via INTRAVENOUS

## 2024-04-26 NOTE — Progress Notes (Addendum)
" °   °  °  Cardiology Office Note Date:  05/30/2024  ID:  Nicholas Burke, DOB [birth year], MRN [id number] PCP:  Auston Opal, DO  Cardiologist:  Joelle VEAR Ren Donley, MD  No chief complaint on file.     Problems Dyspnea No improvement with diuretics Type B aortic dissection w/ descending thoracic intramural hematoma needing TEVAR TTE 8/25 55-60% CTA w/ mod LAD calcification TEVAR 10/25 Repeat EVAR 12/25 due to extension of dissection Uncontrolled HTN HLD on RN5 3x/week DM: HA1C 7.1 8/25, LDL 62 10/25 M: AE10, CL12.5, HE50 TID, RN5  Visits 10/25: Hydral to 50 TID, started amlo 10, LP, BMP, Mag, BNP, chest x-ray, renin-aldo --> 5 days of lasix  40 mg given dyspnea and orthopnea and elevated BNP but no improvement 10/25: TEVAR 11/25: Gross hematuria s/p 2 unit of pRBCs, AKI that has now resolved 12/25: Repeat EVAR due to extension of dissection 1/26: LP, Follow up in 3 months, off aspirin  from urology    Discussed the use of AI scribe software for clinical note transcription with the patient, who gave verbal consent to proceed.  History of Present Illness   Nicholas Burke is a 78 year old male who presents for follow-up. His urologist stopped aspirin  three days ago due to gross hematuria.  His breathing is significantly improved since surgery, though not fully back to baseline. He has no chest pain with activity, and his shortness of breath is much better than before. He denies any orthopnea, PND or LE edema. He checks blood pressure at home once or twice daily, with readings around 118-134/78 mmHg.      ROS: Otherwise negative  Physical Exam VS:  BP 118/72   Pulse 99   Ht 5' 10 (1.778 m)   Wt 176 lb 14.4 oz (80.2 kg)   SpO2 100%   BMI 25.38 kg/m  , BMI Body mass index is 25.38 kg/m. GEN: Well nourished, well developed, in no acute distress HEENT: normal Neck: no JVD, carotid bruits, or masses Cardiac: RRR; no murmurs, rubs, or gallops,no edema  Respiratory:  CTAB bilaterally,  normal work of breathing GI: soft, nontender, nondistended, + BS Extremities: No LE edema Skin: warm and dry, no rash Neuro:  Strength and sensation are intact  Recent Labs: Reviewed  ASSESSMENT AND PLAN Nicholas Burke is a 78 y.o. male who presents for follow up.     Dyspnea Now resolved. No indication for stress test at this time.  Intramural aortic hematoma s/p EVAR Aspirin  stopped due to hematuria. - Discuss with surgeon about restarting aspirin . - No stress test needed as symptoms improved post-surgery.  Essential hypertension Blood pressure well-controlled with current regimen. Alternative medications may affect renal function. - Continue current antihypertensive regimen. - Reassess medication regimen after current issues resolve.  Hyperlipidemia Due for cholesterol check. - Ordered cholesterol test.      Addendum 05/30/24 1:12 PM Pre-op for Inguinal surgery Patient asymptomatic and can walk up a flight of stairs. Given no symptoms and normal EF on recent echo, no additional testing needed prior to surgery - No additional testing needed prior to surgery.   Signed, Joelle VEAR Ren Donley, MD  05/30/2024 1:12 PM    [facility] "

## 2024-05-02 ENCOUNTER — Ambulatory Visit

## 2024-05-02 VITALS — BP 118/72 | HR 99 | Ht 70.0 in | Wt 176.9 lb

## 2024-05-02 DIAGNOSIS — E118 Type 2 diabetes mellitus with unspecified complications: Secondary | ICD-10-CM | POA: Diagnosis not present

## 2024-05-02 DIAGNOSIS — I7123 Aneurysm of the descending thoracic aorta, without rupture: Secondary | ICD-10-CM

## 2024-05-02 DIAGNOSIS — I10 Essential (primary) hypertension: Secondary | ICD-10-CM

## 2024-05-02 DIAGNOSIS — R0600 Dyspnea, unspecified: Secondary | ICD-10-CM

## 2024-05-02 DIAGNOSIS — E785 Hyperlipidemia, unspecified: Secondary | ICD-10-CM | POA: Diagnosis not present

## 2024-05-02 DIAGNOSIS — I2510 Atherosclerotic heart disease of native coronary artery without angina pectoris: Secondary | ICD-10-CM | POA: Diagnosis not present

## 2024-05-02 DIAGNOSIS — I7100 Dissection of unspecified site of aorta: Secondary | ICD-10-CM

## 2024-05-02 NOTE — Patient Instructions (Signed)
 Medication Instructions:  The current medical regimen is effective;  continue present plan and medications.  *If you need a refill on your cardiac medications before your next appointment, please call your pharmacy*  Lab Work: Please have blood work today (Lipid)  If you have labs (blood work) drawn today and your tests are completely normal, you will receive your results only by: MyChart Message (if you have MyChart) OR A paper copy in the mail If you have any lab test that is abnormal or we need to change your treatment, we will call you to review the results.  Follow-Up: At [facility], you and your health needs are our priority.  As part of our continuing mission to provide you with exceptional heart care, our providers are all part of one team.  This team includes your primary Cardiologist (physician) and Advanced Practice Providers or APPs (Physician Assistants and Nurse Practitioners) who all work together to provide you with the care you need, when you need it.  Your next appointment:   6 month(s)  Provider:   Joelle VEAR Ren Donley, MD    We recommend signing up for the patient portal called MyChart.  Sign up information is provided on this After Visit Summary.  MyChart is used to connect with patients for Virtual Visits (Telemedicine).  Patients are able to view lab/test results, encounter notes, upcoming appointments, etc.  Non-urgent messages can be sent to your provider as well.   To learn more about what you can do with MyChart, go to [email or web address].

## 2024-05-03 ENCOUNTER — Ambulatory Visit: Payer: Self-pay

## 2024-05-03 LAB — LIPID PANEL
Chol/HDL Ratio: 3.5 ratio (ref 0.0–5.0)
Cholesterol, Total: 125 mg/dL (ref 100–199)
HDL: 36 mg/dL — ABNORMAL LOW
LDL Chol Calc (NIH): 71 mg/dL (ref 0–99)
Triglycerides: 91 mg/dL (ref 0–149)
VLDL Cholesterol Cal: 18 mg/dL (ref 5–40)

## 2024-05-08 ENCOUNTER — Other Ambulatory Visit (HOSPITAL_COMMUNITY): Payer: Self-pay

## 2024-05-08 ENCOUNTER — Other Ambulatory Visit: Payer: Self-pay

## 2024-05-08 MED ORDER — AMLODIPINE BESYLATE 10 MG PO TABS
10.0000 mg | ORAL_TABLET | Freq: Every day | ORAL | 3 refills | Status: AC
  Filled 2024-05-08: qty 90, 90d supply, fill #0

## 2024-05-09 NOTE — Progress Notes (Unsigned)
 " Office Note    HPI: Nicholas Burke is a 79 y.o. (1945-10-13) male presenting in follow-up status post TEVAR for descending thoracic intramural hematoma from uncontrolled hypertension now degenerated into the thoracic aneurysm, this was extended proximally on 04/10/2024 for 1A endoleak.  He was initially seen in the hospital with intramural hematoma.  This was medically managed, and the patient was asymptomatic, therefore we did not move forward with intervention.  He then grew significantly necessitating TEVAR.  He underwent TEVAR on 02/14/2024.  With proximal extension on 04/10/2024 for 1A endoleak.    He presents today accompanied by his wife.  Since operation, Nicholas Burke has been doing well.  He has had some issues with hematuria, and therefore has stopped his aspirin .  I discussed this with his urologist several weeks ago.  Denies chest pain, abdominal pain, back pain other than some chronic lower back pain which has been present for quite some time.  No hematuria at the moment.   Past Medical History:  Diagnosis Date   AAA (abdominal aortic aneurysm)    Hx of radiation therapy 01/02/13- 02/24/13   prostate 7800 cGy/40 sessions, seminal vesicles 5600 cGy/40 sessions   Hypertension    Pre-diabetes    Prostate cancer (HCC) 08/30/2012   gleason 6, vol 65.7 cc    Past Surgical History:  Procedure Laterality Date   CATARACT EXTRACTION, BILATERAL Bilateral    COLONOSCOPY WITH PROPOFOL  N/A 10/18/2014   Procedure: COLONOSCOPY WITH PROPOFOL ;  Surgeon: [name], MD;  Location: [facility] ENDOSCOPY;  Service: Endoscopy;  Laterality: N/A;   CRYOABLATION N/A 12/24/2020   Procedure: CRYO ABLATION PROSTATE;  Surgeon: [name], MD;  Location: [facility] ORS;  Service: Urology;  Laterality: N/A;   CYSTOSCOPY WITH FULGERATION N/A 03/18/2024   Procedure: CYSTOSCOPY, WITH BLADDER FULGURATION;  Surgeon: Sherrilee Belvie CROME, MD;  Location: [facility] OR[facility];  Service: Urology;  Laterality: N/A;    ESOPHAGOGASTRODUODENOSCOPY N/A 12/20/2023   Procedure: EGD (ESOPHAGOGASTRODUODENOSCOPY);  Surgeon: [name], MD;  Location: [facility] ENDOSCOPY;  Service: Gastroenterology;  Laterality: N/A;   ESOPHAGOGASTRODUODENOSCOPY N/A 12/26/2023   Procedure: EGD (ESOPHAGOGASTRODUODENOSCOPY);  Surgeon: [name], MD;  Location: [facility] ENDOSCOPY;  Service: Gastroenterology;  Laterality: N/A;   EYE SURGERY Bilateral    cataract surgery   HERNIA REPAIR     HOT HEMOSTASIS N/A 10/18/2014   Procedure: HOT HEMOSTASIS (ARGON PLASMA COAGULATION/BICAP);  Surgeon: [name], MD;  Location: [facility];  Service: Endoscopy;  Laterality: N/A;   PROSTATE BIOPSY  08/25/2012   THORACIC AORTIC ENDOVASCULAR STENT GRAFT Right 02/14/2024   Procedure: INSERTION, ENDOVASCULAR STENT GRAFT, AORTA, THORACIC;  Surgeon: Lanis Fonda BRAVO, MD;  Location: [facility] OR;  Service: Vascular;  Laterality: Right;   THORACIC AORTIC ENDOVASCULAR STENT GRAFT N/A 04/10/2024   Procedure: INSERTION, ENDOVASCULAR STENT GRAFT, AORTA, THORACIC;  Surgeon: Lanis Fonda BRAVO, MD;  Location: [facility] OR;  Service: Vascular;  Laterality: N/A;   ULTRASOUND GUIDANCE Right 02/14/2024   Procedure: ULTRASOUND GUIDANCE;  Surgeon: Lanis Fonda BRAVO, MD;  Location: [facility] OR;  Service: Vascular;  Laterality: Right;    Social History   Socioeconomic History   Marital status: Married    Spouse name: Not on file   Number of children: 4   Years of education: Not on file   Highest education level: Not on file  Occupational History    Employer: [employer]  Tobacco Use   Smoking status: Never   Smokeless tobacco: Never  Vaping Use   Vaping status: Never Used  Substance and Sexual Activity  Alcohol use: No   Drug use: No   Sexual activity: Not on file  Other Topics Concern   Not on file  Social History Narrative   Not on file   Social Drivers of Health   Tobacco Use: Low Risk (05/02/2024)   Patient History    Smoking Tobacco Use: Never    Smokeless Tobacco  Use: Never    Passive Exposure: Not on file  Financial Resource Strain: Not on file  Food Insecurity: No Food Insecurity (03/17/2024)   Epic    Worried About Programme Researcher, Broadcasting/film/video in the Last Year: Never true    Ran Out of Food in the Last Year: Never true  Transportation Needs: No Transportation Needs (03/17/2024)   Epic    Lack of Transportation (Medical): No    Lack of Transportation (Non-Medical): No  Physical Activity: Not on file  Stress: Not on file  Social Connections: Socially Integrated (03/17/2024)   Social Connection and Isolation Panel    Frequency of Communication with Friends and Family: Three times a week    Frequency of Social Gatherings with Friends and Family: Three times a week    Attends Religious Services: More than 4 times per year    Active Member of Clubs or Organizations: Yes    Attends Banker Meetings: More than 4 times per year    Marital Status: Married  Catering Manager Violence: Not At Risk (03/17/2024)   Epic    Fear of Current or Ex-Partner: No    Emotionally Abused: No    Physically Abused: No    Sexually Abused: No  Depression (PHQ2-9): Not on file  Alcohol Screen: Not on file  Housing: Low Risk (03/17/2024)   Epic    Unable to Pay for Housing in the Last Year: No    Number of Times Moved in the Last Year: 0    Homeless in the Last Year: No  Utilities: Not At Risk (03/17/2024)   Epic    Threatened with loss of utilities: No  Health Literacy: Not on file   Family History  Problem Relation Age of Onset   Prostate cancer Father    Prostate cancer Brother     Current Outpatient Medications  Medication Sig Dispense Refill   acetaminophen  (TYLENOL ) 325 MG tablet Take 2 tablets (650 mg total) by mouth every 6 (six) hours as needed for mild pain (pain score 1-3) or fever (or Fever >/= 101).     amLODipine  (NORVASC ) 10 MG tablet Take 1 tablet (10 mg total) by mouth daily. 90 tablet 3   aspirin  EC 81 MG tablet Take 1 tablet (81 mg  total) by mouth daily at 6 (six) AM. Swallow whole. (Patient not taking: Reported on 05/02/2024)     carvedilol  (COREG ) 12.5 MG tablet Take 1 tablet (12.5 mg total) by mouth 2 (two) times daily with a meal. 100 tablet 2   feeding supplement (ENSURE PLUS HIGH PROTEIN) LIQD Take 237 mLs by mouth 2 (two) times daily between meals. 14220 mL 1   finasteride  (PROSCAR ) 5 MG tablet Take 5 mg by mouth daily.     hydrALAZINE  (APRESOLINE ) 50 MG tablet Take 1 tablet (50 mg total) by mouth 3 (three) times daily. 270 tablet 3   oxyCODONE -acetaminophen  (PERCOCET) 5-325 MG tablet Take 1 tablet by mouth every 6 (six) hours as needed for severe pain (pain score 7-10). 8 tablet 0   pantoprazole  (PROTONIX ) 40 MG tablet Take 1 tablet (40 mg total) by mouth  2 (two) times daily before a meal. 60 tablet 1   polyethylene glycol powder (GLYCOLAX /MIRALAX ) 17 GM/SCOOP powder Dissolve 1 capful (17g) in 4-8 ounces of liquid and take by mouth daily. 238 g 0   rosuvastatin  (CRESTOR ) 5 MG tablet Take 5 mg by mouth every Monday, Wednesday, and Friday at 6 PM.     No current facility-administered medications for this visit.    Allergies  Allergen Reactions   Reglan  [Metoclopramide ] Other (See Comments)    Pt states that it made him shake and had difficulty speaking     REVIEW OF SYSTEMS:  [X]  denotes positive finding, [ ]  denotes negative finding Cardiac  Comments:  Chest pain or chest pressure:    Shortness of breath upon exertion:    Short of breath when lying flat:    Irregular heart rhythm:        Vascular    Pain in calf, thigh, or hip brought on by ambulation:    Pain in feet at night that wakes you up from your sleep:     Blood clot in your veins:    Leg swelling:         Pulmonary    Oxygen at home:    Productive cough:     Wheezing:         Neurologic    Sudden weakness in arms or legs:     Sudden numbness in arms or legs:     Sudden onset of difficulty speaking or slurred speech:    Temporary loss of  vision in one eye:     Problems with dizziness:         Gastrointestinal    Blood in stool:     Vomited blood:         Genitourinary    Burning when urinating:     Blood in urine:        Psychiatric    Major depression:         Hematologic    Bleeding problems:    Problems with blood clotting too easily:        Skin    Rashes or ulcers:        Constitutional    Fever or chills:      PHYSICAL EXAMINATION:  There were no vitals filed for this visit.  General:  WDWN in NAD; vital signs documented above Gait: Not observed HENT: WNL, normocephalic Pulmonary: normal non-labored breathing , without wheezing Cardiac: regular HR Abdomen: soft, NT, no masses Skin: without rashes Vascular Exam/Pulses:  Right Left  Radial 2+ (normal) 2+ (normal)              DP 2+ (normal) 2+ (normal)       Extremities: without ischemic changes, without Gangrene , without cellulitis; without open wounds;  Musculoskeletal: no muscle wasting or atrophy  Neurologic: A&O X 3;  No focal weakness or paresthesias are detected Psychiatric:  The pt has Normal affect.   Non-Invasive Vascular Imaging:   New CTA demonstrated 5 cm descending thoracic aorta.  This has had significant aneurysmal degeneration over the last month.    ASSESSMENT/PLAN: Alcides Nutting is a 79 y.o. male presenting with descending aorta intramural hematoma degenerated into aneurysm now status post TEVAR with proximal extension for type Ia endoleak  Imaging was reviewed.  Graft is sitting nicely.  No concerns.  Resolution of type Ia endoleak.  There is a small type II endoleak present, however this is small.  I have no  concerns at the moment.  Distally, his infrarenal aorta is widely patent.  The aortic stent is sitting nicely.  My plan is to follow-up Calixto in 1 year with repeat chest abdomen pelvis to assess for further aneurysmal degeneration, specifically at the descending portion of the thoracic aorta where there is a small  PACU.  Will also assess for any aneurysm growth in the chest and ensure the infrarenal stent is widely patent.  I asked him to call my office should any questions or concerns arise.   Fonda FORBES Rim, MD [facility] [phone]  "

## 2024-05-11 ENCOUNTER — Encounter: Payer: Self-pay | Admitting: Vascular Surgery

## 2024-05-11 ENCOUNTER — Ambulatory Visit: Attending: Vascular Surgery | Admitting: Vascular Surgery

## 2024-05-11 VITALS — BP 109/67 | HR 93 | Temp 97.1°F | Resp 18 | Ht 70.0 in | Wt 175.4 lb

## 2024-05-11 DIAGNOSIS — I7123 Aneurysm of the descending thoracic aorta, without rupture: Secondary | ICD-10-CM | POA: Diagnosis not present

## 2024-05-11 DIAGNOSIS — Z9889 Other specified postprocedural states: Secondary | ICD-10-CM

## 2024-05-11 DIAGNOSIS — I7102 Dissection of abdominal aorta: Secondary | ICD-10-CM

## 2024-05-11 DIAGNOSIS — I7100 Dissection of unspecified site of aorta: Secondary | ICD-10-CM

## 2024-05-11 DIAGNOSIS — Z8679 Personal history of other diseases of the circulatory system: Secondary | ICD-10-CM | POA: Diagnosis not present

## 2024-05-17 ENCOUNTER — Encounter: Payer: Self-pay | Admitting: Pulmonary Disease

## 2024-05-17 ENCOUNTER — Ambulatory Visit

## 2024-05-17 ENCOUNTER — Ambulatory Visit: Admitting: Pulmonary Disease

## 2024-05-17 VITALS — BP 110/68 | HR 82 | Temp 97.8°F | Ht 70.0 in | Wt 174.0 lb

## 2024-05-17 DIAGNOSIS — Z8701 Personal history of pneumonia (recurrent): Secondary | ICD-10-CM | POA: Diagnosis not present

## 2024-05-17 DIAGNOSIS — R0600 Dyspnea, unspecified: Secondary | ICD-10-CM

## 2024-05-17 DIAGNOSIS — R5383 Other fatigue: Secondary | ICD-10-CM | POA: Diagnosis not present

## 2024-05-17 DIAGNOSIS — R0689 Other abnormalities of breathing: Secondary | ICD-10-CM

## 2024-05-17 LAB — PULMONARY FUNCTION TEST
DL/VA % pred: 96 %
DL/VA: 3.78 ml/min/mmHg/L
DLCO cor % pred: 80 %
DLCO cor: 20.26 ml/min/mmHg
DLCO unc % pred: 57 %
DLCO unc: 14.35 ml/min/mmHg
FEF 25-75 Post: 1.34 L/s
FEF 25-75 Pre: 1.41 L/s
FEF2575-%Change-Post: -4 %
FEF2575-%Pred-Post: 62 %
FEF2575-%Pred-Pre: 65 %
FEV1-%Change-Post: -1 %
FEV1-%Pred-Post: 76 %
FEV1-%Pred-Pre: 77 %
FEV1-Post: 2.32 L
FEV1-Pre: 2.35 L
FEV1FVC-%Change-Post: 0 %
FEV1FVC-%Pred-Pre: 94 %
FEV6-%Change-Post: -1 %
FEV6-%Pred-Post: 86 %
FEV6-%Pred-Pre: 87 %
FEV6-Post: 3.39 L
FEV6-Pre: 3.44 L
FEV6FVC-%Change-Post: 0 %
FEV6FVC-%Pred-Post: 106 %
FEV6FVC-%Pred-Pre: 106 %
FVC-%Change-Post: -1 %
FVC-%Pred-Post: 80 %
FVC-%Pred-Pre: 82 %
FVC-Post: 3.39 L
FVC-Pre: 3.44 L
Post FEV1/FVC ratio: 68 %
Post FEV6/FVC ratio: 100 %
Pre FEV1/FVC ratio: 68 %
Pre FEV6/FVC Ratio: 100 %
RV % pred: 79 %
RV: 2.08 L
TLC % pred: 75 %
TLC: 5.37 L

## 2024-05-17 NOTE — Progress Notes (Signed)
 "              Nicholas Burke    [id number]    1945-06-28  Primary Care Physician:Sparks, Milo, DO  Referring Physician: Viva Gallaher, MD [address] [place],  [place] [address]  Chief complaint: Follow-up for dyspnea, fatigue  HPI: 79 y.o. who  has a past medical history of AAA (abdominal aortic aneurysm), radiation therapy (01/02/13- 02/24/13), Hypertension, Pre-diabetes, and Prostate cancer (HCC) (08/30/2012).  Discussed the use of AI scribe software for clinical note transcription with the patient, who gave verbal consent to proceed.  History of Present Illness Nicholas Burke is a 79 year old male with a history of type B aortic dissection, hypertension, prostate cancer who presents with persistent fatigue post-hospitalization. He is accompanied by his wife.  Fatigue and exercise intolerance - Persistent fatigue since recent hospitalization, particularly during activities such as shaving or performing light chores - Able to rest without symptoms; fatigue is activity-induced - Previously active, including mowing and cutting tree limbs - Continues to use an exercise bike to maintain physical activity despite fatigue  Recent hospitalization and complications - Recent hospitalization for descending thoracic aorta aneurysm and dissection requiring endovascular repair.  He had a prolonged hospital stay complicated by acute respiratory failure and aspiration pneumonia, AKI, upper GI bleed due to gastric ulcers.  Interim history: Nicholas Burke is a 79 year old male who presents for follow-up after hospitalization for aortic dissection and repair. He is accompanied by his wife.  Postoperative recovery after aortic dissection repair - Hospitalized for aortic dissection and surgical repair requiring ICU care and ventilator support - Gradual improvement in strength and breathing since discharge - Chest tube remained in place for approximately one month and was subsequently removed -  Completed a short course of physical therapy and continues exercises independently  Pulmonary function and imaging findings - Recent CT scan on April 26, 2024 demonstrated residual atelectasis, improved lung inflammation, and mild emphysema - Pulmonary function testing revealed mild reduction in lung capacity - No history of tobacco use  Aspiration pneumonia and lung inflammation - Developed aspiration pneumonia and lung inflammation during hospitalization - Pulmonary inflammation has improved since discharge  Relevant Pulmonary history: Pets: No pets Occupation: Retired chartered certified accountant Exposures: No mold, hot tub, Financial Controller.  No feather pillows or comforter No h/o chemo/XRT/amiodarone/macrodantin /MTX  No exposure to asbestos, silica or other organic allergens  Smoking history: Never smoker Travel history: No significant travel history Family history: No family history of lung disease  Outpatient Encounter Medications as of 05/17/2024  Medication Sig   acetaminophen  (TYLENOL ) 325 MG tablet Take 2 tablets (650 mg total) by mouth every 6 (six) hours as needed for mild pain (pain score 1-3) or fever (or Fever >/= 101).   amLODipine  (NORVASC ) 10 MG tablet Take 1 tablet (10 mg total) by mouth daily.   aspirin  EC 81 MG tablet Take 1 tablet (81 mg total) by mouth daily at 6 (six) AM. Swallow whole. (Patient not taking: Reported on 05/11/2024)   carvedilol  (COREG ) 12.5 MG tablet Take 1 tablet (12.5 mg total) by mouth 2 (two) times daily with a meal.   feeding supplement (ENSURE PLUS HIGH PROTEIN) LIQD Take 237 mLs by mouth 2 (two) times daily between meals.   finasteride  (PROSCAR ) 5 MG tablet Take 5 mg by mouth daily.   hydrALAZINE  (APRESOLINE ) 50 MG tablet Take 1 tablet (50 mg total) by mouth 3 (three) times daily.   oxyCODONE -acetaminophen  (PERCOCET) 5-325 MG tablet Take 1  tablet by mouth every 6 (six) hours as needed for severe pain (pain score 7-10).   pantoprazole  (PROTONIX ) 40 MG tablet Take 1  tablet (40 mg total) by mouth 2 (two) times daily before a meal.   polyethylene glycol powder (GLYCOLAX /MIRALAX ) 17 GM/SCOOP powder Dissolve 1 capful (17g) in 4-8 ounces of liquid and take by mouth daily.   rosuvastatin  (CRESTOR ) 5 MG tablet Take 5 mg by mouth every Monday, Wednesday, and Friday at 6 PM.   No facility-administered encounter medications on file as of 05/17/2024.     Physical Exam:  Today's Vitals   05/17/24 1052  BP: 110/68  Pulse: 82  Temp: 97.8 F (36.6 C)  TempSrc: Oral  SpO2: 99%  Weight: 174 lb (78.9 kg)  Height: 5' 10 (1.778 m)    There is no height or weight on file to calculate BMI.  Physical Exam GEN: No acute distress CV: Regular rate and rhythm no murmurs LUNGS: Clear to auscultation bilaterally normal respiratory effort SKIN JOINTS: Warm and dry no rash    Data Reviewed: Imaging: CTA 01/25/2024-progressive aneurysm of proximal descending aorta with penetrating ulceration, early dissection, intramural hematoma.  Mild emphysema.  Moderate coronary artery calcification CT chest 03/17/2024-visualized lungs show patchy mild ground glass opacities in both lungs CT chest 04/26/2024-mild bibasal segmental atelectasis. I have reviewed the images personally.  PFTs: 05/17/2024 FVC 3.39 [80%], FEV1 2.32 [76%], F/F68, TLC 5.37 [75%], DLCO corrected 20.26 [80%] Mild restriction  Labs:  Assessment & Plan Deconditioning after hospitalization Deconditioning following hospitalization for aortic dissection and repair. Significant weakness post-hospitalization, but gradual improvement in strength and breathing. No intrinsic lung disease, and mild emphysema on CT is not significant.  Has significant anemia as well due to hematuria which may be contributing to her symptoms.  Lung function tests show mild reduction in lung capacity, likely due to recovery from inflammation. No need for regular follow-up with pulmonary specialist unless issues arise. - Continue home  exercise program to improve conditioning - Follow up with surgeons as needed  History of aspiration pneumonia Aspiration pneumonia during hospitalization, with subsequent improvement in lung inflammation. Last CT scan on December 31st shows resolution of inflammation, just has mild atelectasis at the base. Lung function tests are stable with mild reduction in capacity, likely due to recovery from inflammation. - No need for inhaler - Continue current management and monitor for any respiratory symptoms  Recommendations: Follow-up as needed  I personally spent a total of 30 minutes in the care of the patient today including preparing to see the patient, getting/reviewing separately obtained history, counseling and educating, referring and communicating with other health care professionals, documenting clinical information in the EHR, independently interpreting results, and communicating results.   Charlottie Peragine MD [facility] 05/17/2024, 10:37 AM  CC: Jesusita Jocelyn, MD   "

## 2024-05-17 NOTE — Patient Instructions (Signed)
 Full pft performed today

## 2024-05-17 NOTE — Patient Instructions (Signed)
" °  VISIT SUMMARY: During your visit, we discussed your recovery after hospitalization for aortic dissection and repair. You have shown gradual improvement in strength and breathing, and your lung inflammation has improved.  YOUR PLAN: DECONDITIONING AFTER HOSPITALIZATION: You experienced significant weakness after your hospitalization for aortic dissection and repair, but you are gradually improving. -Continue your home exercise program to improve conditioning. -Follow up with your surgeons as needed.  HISTORY OF ASPIRATION PNEUMONIA: You developed aspiration pneumonia during your hospitalization, but your lung inflammation has improved. -You do not need an inhaler at this time. -Continue your current management and monitor for any respiratory symptoms.    Contains text generated by Abridge.   "

## 2024-05-17 NOTE — Progress Notes (Signed)
 Full pft performed today

## 2024-05-29 ENCOUNTER — Encounter (HOSPITAL_BASED_OUTPATIENT_CLINIC_OR_DEPARTMENT_OTHER): Admitting: General Surgery

## 2024-05-30 ENCOUNTER — Telehealth (HOSPITAL_BASED_OUTPATIENT_CLINIC_OR_DEPARTMENT_OTHER): Payer: Self-pay | Admitting: *Deleted

## 2024-06-02 ENCOUNTER — Encounter (HOSPITAL_BASED_OUTPATIENT_CLINIC_OR_DEPARTMENT_OTHER): Admitting: Internal Medicine
